# Patient Record
Sex: Male | Born: 1955 | Race: White | Hispanic: Yes | Marital: Married | Smoking: Current every day smoker
Health system: Southern US, Community
[De-identification: ages and names within clinical notes are randomized; demographics above are authoritative.]

## PROBLEM LIST (undated history)

## (undated) DIAGNOSIS — R972 Elevated prostate specific antigen [PSA]: Secondary | ICD-10-CM

## (undated) DIAGNOSIS — M5030 Other cervical disc degeneration, unspecified cervical region: Secondary | ICD-10-CM

## (undated) DIAGNOSIS — Z86010 Personal history of colonic polyps: Secondary | ICD-10-CM

## (undated) DIAGNOSIS — H269 Unspecified cataract: Secondary | ICD-10-CM

## (undated) DIAGNOSIS — E119 Type 2 diabetes mellitus without complications: Secondary | ICD-10-CM

## (undated) DIAGNOSIS — N401 Enlarged prostate with lower urinary tract symptoms: Secondary | ICD-10-CM

## (undated) DIAGNOSIS — I10 Essential (primary) hypertension: Secondary | ICD-10-CM

## (undated) DIAGNOSIS — I2510 Atherosclerotic heart disease of native coronary artery without angina pectoris: Secondary | ICD-10-CM

## (undated) DIAGNOSIS — C679 Malignant neoplasm of bladder, unspecified: Secondary | ICD-10-CM

## (undated) DIAGNOSIS — Z85528 Personal history of other malignant neoplasm of kidney: Secondary | ICD-10-CM

## (undated) DIAGNOSIS — N529 Male erectile dysfunction, unspecified: Secondary | ICD-10-CM

## (undated) DIAGNOSIS — F1910 Other psychoactive substance abuse, uncomplicated: Secondary | ICD-10-CM

## (undated) DIAGNOSIS — C801 Malignant (primary) neoplasm, unspecified: Secondary | ICD-10-CM

## (undated) DIAGNOSIS — K219 Gastro-esophageal reflux disease without esophagitis: Secondary | ICD-10-CM

## (undated) DIAGNOSIS — Z87442 Personal history of urinary calculi: Secondary | ICD-10-CM

## (undated) DIAGNOSIS — M1990 Unspecified osteoarthritis, unspecified site: Secondary | ICD-10-CM

## (undated) DIAGNOSIS — R7309 Other abnormal glucose: Secondary | ICD-10-CM

## (undated) DIAGNOSIS — E785 Hyperlipidemia, unspecified: Secondary | ICD-10-CM

## (undated) DIAGNOSIS — Z860101 Personal history of adenomatous and serrated colon polyps: Secondary | ICD-10-CM

## (undated) DIAGNOSIS — M5020 Other cervical disc displacement, unspecified cervical region: Secondary | ICD-10-CM

## (undated) DIAGNOSIS — C641 Malignant neoplasm of right kidney, except renal pelvis: Secondary | ICD-10-CM

## (undated) DIAGNOSIS — G709 Myoneural disorder, unspecified: Secondary | ICD-10-CM

## (undated) DIAGNOSIS — D49519 Neoplasm of unspecified behavior of unspecified kidney: Secondary | ICD-10-CM

## (undated) HISTORY — DX: Other psychoactive substance abuse, uncomplicated: F19.10

## (undated) HISTORY — DX: Other abnormal glucose: R73.09

## (undated) HISTORY — PX: OTHER SURGICAL HISTORY: SHX169

## (undated) HISTORY — DX: Elevated prostate specific antigen (PSA): R97.20

## (undated) HISTORY — DX: Unspecified osteoarthritis, unspecified site: M19.90

## (undated) HISTORY — DX: Unspecified cataract: H26.9

## (undated) HISTORY — PX: EXTRACORPOREAL SHOCK WAVE LITHOTRIPSY: SHX1557

## (undated) HISTORY — DX: Essential (primary) hypertension: I10

## (undated) HISTORY — DX: Malignant (primary) neoplasm, unspecified: C80.1

## (undated) HISTORY — DX: Type 2 diabetes mellitus without complications: E11.9

## (undated) HISTORY — PX: CATARACT EXTRACTION: SUR2

## (undated) HISTORY — DX: Hyperlipidemia, unspecified: E78.5

---
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
## 1998-04-12 ENCOUNTER — Emergency Department (HOSPITAL_COMMUNITY): Admission: EM | Admit: 1998-04-12 | Discharge: 1998-04-12 | Payer: Self-pay

## 2002-07-03 ENCOUNTER — Encounter: Admission: RE | Admit: 2002-07-03 | Discharge: 2002-07-03 | Payer: Self-pay | Admitting: Internal Medicine

## 2002-07-03 ENCOUNTER — Encounter: Payer: Self-pay | Admitting: Internal Medicine

## 2004-09-13 ENCOUNTER — Ambulatory Visit: Payer: Self-pay | Admitting: Internal Medicine

## 2004-10-11 ENCOUNTER — Ambulatory Visit: Payer: Self-pay | Admitting: Internal Medicine

## 2004-11-06 ENCOUNTER — Ambulatory Visit: Payer: Self-pay | Admitting: Internal Medicine

## 2004-12-12 ENCOUNTER — Ambulatory Visit (HOSPITAL_COMMUNITY): Admission: RE | Admit: 2004-12-12 | Discharge: 2004-12-12 | Payer: Self-pay | Admitting: Internal Medicine

## 2005-01-08 ENCOUNTER — Ambulatory Visit: Payer: Self-pay | Admitting: Internal Medicine

## 2005-01-09 ENCOUNTER — Ambulatory Visit: Payer: Self-pay | Admitting: Internal Medicine

## 2005-11-14 ENCOUNTER — Ambulatory Visit: Payer: Self-pay | Admitting: Internal Medicine

## 2006-01-16 ENCOUNTER — Ambulatory Visit: Payer: Self-pay | Admitting: Endocrinology

## 2006-04-12 ENCOUNTER — Ambulatory Visit: Payer: Self-pay | Admitting: Internal Medicine

## 2006-06-24 ENCOUNTER — Ambulatory Visit: Payer: Self-pay | Admitting: Internal Medicine

## 2006-06-25 ENCOUNTER — Ambulatory Visit: Payer: Self-pay | Admitting: Internal Medicine

## 2006-07-11 ENCOUNTER — Ambulatory Visit: Payer: Self-pay | Admitting: Internal Medicine

## 2006-07-23 ENCOUNTER — Ambulatory Visit: Payer: Self-pay | Admitting: Internal Medicine

## 2006-08-07 ENCOUNTER — Ambulatory Visit: Payer: Self-pay | Admitting: Internal Medicine

## 2006-08-07 ENCOUNTER — Encounter (INDEPENDENT_AMBULATORY_CARE_PROVIDER_SITE_OTHER): Payer: Self-pay | Admitting: Specialist

## 2006-10-15 DIAGNOSIS — Z87438 Personal history of other diseases of male genital organs: Secondary | ICD-10-CM

## 2006-10-15 HISTORY — DX: Personal history of other diseases of male genital organs: Z87.438

## 2006-12-24 ENCOUNTER — Ambulatory Visit: Payer: Self-pay | Admitting: Internal Medicine

## 2007-04-12 ENCOUNTER — Ambulatory Visit: Payer: Self-pay | Admitting: Internal Medicine

## 2007-04-17 ENCOUNTER — Ambulatory Visit: Payer: Self-pay | Admitting: Internal Medicine

## 2007-05-21 ENCOUNTER — Ambulatory Visit: Payer: Self-pay | Admitting: Internal Medicine

## 2007-06-04 ENCOUNTER — Ambulatory Visit: Payer: Self-pay | Admitting: Internal Medicine

## 2007-06-04 ENCOUNTER — Encounter: Payer: Self-pay | Admitting: Internal Medicine

## 2007-06-04 LAB — HM COLONOSCOPY

## 2007-06-26 ENCOUNTER — Ambulatory Visit: Payer: Self-pay | Admitting: Internal Medicine

## 2007-06-26 LAB — CONVERTED CEMR LAB
ALT: 30 units/L (ref 0–53)
AST: 19 units/L (ref 0–37)
Albumin: 3.9 g/dL (ref 3.5–5.2)
Alkaline Phosphatase: 88 units/L (ref 39–117)
BUN: 21 mg/dL (ref 6–23)
Bacteria, UA: NEGATIVE
Basophils Absolute: 0.1 10*3/uL (ref 0.0–0.1)
Basophils Relative: 0.8 % (ref 0.0–1.0)
Bilirubin Urine: NEGATIVE
Bilirubin, Direct: 0.1 mg/dL (ref 0.0–0.3)
CO2: 30 meq/L (ref 19–32)
Calcium: 9.1 mg/dL (ref 8.4–10.5)
Chloride: 107 meq/L (ref 96–112)
Cholesterol: 132 mg/dL (ref 0–200)
Creatinine, Ser: 0.9 mg/dL (ref 0.4–1.5)
Crystals: NEGATIVE
Eosinophils Absolute: 0.3 10*3/uL (ref 0.0–0.6)
Eosinophils Relative: 4.2 % (ref 0.0–5.0)
GFR calc Af Amer: 114 mL/min
GFR calc non Af Amer: 94 mL/min
Glucose, Bld: 122 mg/dL — ABNORMAL HIGH (ref 70–99)
HCT: 45.7 % (ref 39.0–52.0)
HDL: 22.9 mg/dL — ABNORMAL LOW (ref >39.0)
Hemoglobin, Urine: NEGATIVE
Hemoglobin: 15.8 g/dL (ref 13.0–17.0)
Ketones, ur: NEGATIVE mg/dL
LDL Cholesterol: 88 mg/dL (ref 0–99)
Lymphocytes Relative: 20.3 % (ref 12.0–46.0)
MCHC: 34.6 g/dL (ref 30.0–36.0)
MCV: 88.8 fL (ref 78.0–100.0)
Monocytes Absolute: 0.6 10*3/uL (ref 0.2–0.7)
Monocytes Relative: 9.1 % (ref 3.0–11.0)
Mucus, UA: NEGATIVE
Neutro Abs: 4.6 10*3/uL (ref 1.4–7.7)
Neutrophils Relative %: 65.6 % (ref 43.0–77.0)
Nitrite: NEGATIVE
PSA: 2.46 ng/mL (ref 0.10–4.00)
Platelets: 162 10*3/uL (ref 150–400)
Potassium: 3.6 meq/L (ref 3.5–5.1)
RBC: 5.14 M/uL (ref 4.22–5.81)
RDW: 13.5 % (ref 11.5–14.6)
Sodium: 143 meq/L (ref 135–145)
Specific Gravity, Urine: 1.015 (ref 1.000–1.03)
TSH: 0.61 microintl units/mL (ref 0.35–5.50)
Total Bilirubin: 0.5 mg/dL (ref 0.3–1.2)
Total CHOL/HDL Ratio: 5.8
Total Protein, Urine: NEGATIVE mg/dL
Total Protein: 6.6 g/dL (ref 6.0–8.3)
Triglycerides: 106 mg/dL (ref 0–149)
Urine Glucose: NEGATIVE mg/dL
Urobilinogen, UA: 0.2 (ref 0.0–1.0)
VLDL: 21 mg/dL (ref 0–40)
WBC: 7 10*3/uL (ref 4.5–10.5)
pH: 6.5 (ref 5.0–8.0)

## 2007-06-27 ENCOUNTER — Ambulatory Visit: Payer: Self-pay | Admitting: Internal Medicine

## 2007-06-27 DIAGNOSIS — G43809 Other migraine, not intractable, without status migrainosus: Secondary | ICD-10-CM | POA: Insufficient documentation

## 2007-06-27 DIAGNOSIS — R972 Elevated prostate specific antigen [PSA]: Secondary | ICD-10-CM | POA: Insufficient documentation

## 2007-06-30 ENCOUNTER — Encounter: Payer: Self-pay | Admitting: Internal Medicine

## 2007-06-30 DIAGNOSIS — I10 Essential (primary) hypertension: Secondary | ICD-10-CM | POA: Insufficient documentation

## 2007-10-28 ENCOUNTER — Ambulatory Visit: Payer: Self-pay | Admitting: Internal Medicine

## 2007-10-28 DIAGNOSIS — B079 Viral wart, unspecified: Secondary | ICD-10-CM | POA: Insufficient documentation

## 2007-12-18 ENCOUNTER — Encounter: Payer: Self-pay | Admitting: Internal Medicine

## 2008-05-07 ENCOUNTER — Ambulatory Visit: Payer: Self-pay | Admitting: Internal Medicine

## 2008-05-07 LAB — CONVERTED CEMR LAB
BUN: 15 mg/dL (ref 6–23)
CO2: 28 meq/L (ref 19–32)
Calcium: 8.9 mg/dL (ref 8.4–10.5)
Chloride: 105 meq/L (ref 96–112)
Cholesterol: 147 mg/dL (ref 0–200)
Creatinine, Ser: 0.8 mg/dL (ref 0.4–1.5)
GFR calc Af Amer: 130 mL/min
GFR calc non Af Amer: 107 mL/min
Glucose, Bld: 157 mg/dL — ABNORMAL HIGH (ref 70–99)
HDL: 24.8 mg/dL — ABNORMAL LOW (ref >39.0)
IgG (Immunoglobin G), Serum: 815 mg/dL (ref 694–1618)
LDL Cholesterol: 100 mg/dL — ABNORMAL HIGH (ref 0–99)
Potassium: 3.5 meq/L (ref 3.5–5.1)
Sodium: 141 meq/L (ref 135–145)
TSH: 1.02 microintl units/mL (ref 0.35–5.50)
Total CHOL/HDL Ratio: 5.9
Triglycerides: 111 mg/dL (ref 0–149)
VLDL: 22 mg/dL (ref 0–40)

## 2008-05-10 ENCOUNTER — Ambulatory Visit: Payer: Self-pay | Admitting: Internal Medicine

## 2008-06-03 ENCOUNTER — Telehealth: Payer: Self-pay | Admitting: Internal Medicine

## 2008-08-13 ENCOUNTER — Ambulatory Visit: Payer: Self-pay | Admitting: Internal Medicine

## 2008-08-15 LAB — CONVERTED CEMR LAB
BUN: 14 mg/dL (ref 6–23)
CO2: 30 meq/L (ref 19–32)
Calcium: 9.2 mg/dL (ref 8.4–10.5)
Chloride: 108 meq/L (ref 96–112)
Creatinine, Ser: 0.8 mg/dL (ref 0.4–1.5)
GFR calc Af Amer: 130 mL/min
GFR calc non Af Amer: 107 mL/min
Glucose, Bld: 129 mg/dL — ABNORMAL HIGH (ref 70–99)
Hgb A1c MFr Bld: 6.1 % — ABNORMAL HIGH (ref 4.6–6.0)
Potassium: 4 meq/L (ref 3.5–5.1)
Sodium: 144 meq/L (ref 135–145)
Vitamin B-12: 464 pg/mL (ref 211–911)

## 2008-11-05 ENCOUNTER — Ambulatory Visit: Payer: Self-pay | Admitting: Internal Medicine

## 2008-11-08 LAB — CONVERTED CEMR LAB
ALT: 37 units/L (ref 0–53)
AST: 18 units/L (ref 0–37)
Albumin: 4 g/dL (ref 3.5–5.2)
Alkaline Phosphatase: 113 units/L (ref 39–117)
BUN: 19 mg/dL (ref 6–23)
Basophils Absolute: 0 10*3/uL (ref 0.0–0.1)
Basophils Relative: 0.1 % (ref 0.0–3.0)
Bilirubin Urine: NEGATIVE
Bilirubin, Direct: 0.1 mg/dL (ref 0.0–0.3)
CO2: 28 meq/L (ref 19–32)
Calcium: 9.4 mg/dL (ref 8.4–10.5)
Chloride: 109 meq/L (ref 96–112)
Cholesterol: 134 mg/dL (ref 0–200)
Creatinine, Ser: 0.8 mg/dL (ref 0.4–1.5)
Eosinophils Absolute: 0.2 10*3/uL (ref 0.0–0.7)
Eosinophils Relative: 2.8 % (ref 0.0–5.0)
GFR calc Af Amer: 130 mL/min
GFR calc non Af Amer: 107 mL/min
Glucose, Bld: 135 mg/dL — ABNORMAL HIGH (ref 70–99)
HCT: 49.9 % (ref 39.0–52.0)
HDL: 24.8 mg/dL — ABNORMAL LOW (ref >39.0)
Hemoglobin: 17 g/dL (ref 13.0–17.0)
Hgb A1c MFr Bld: 6.2 % — ABNORMAL HIGH (ref 4.6–6.0)
Ketones, ur: NEGATIVE mg/dL
LDL Cholesterol: 77 mg/dL (ref 0–99)
Leukocytes, UA: NEGATIVE
Lymphocytes Relative: 14.9 % (ref 12.0–46.0)
MCHC: 34 g/dL (ref 30.0–36.0)
MCV: 89.4 fL (ref 78.0–100.0)
Monocytes Absolute: 0.7 10*3/uL (ref 0.1–1.0)
Monocytes Relative: 9.3 % (ref 3.0–12.0)
Neutro Abs: 5.9 10*3/uL (ref 1.4–7.7)
Neutrophils Relative %: 72.9 % (ref 43.0–77.0)
Nitrite: NEGATIVE
PSA: 2.17 ng/mL (ref 0.10–4.00)
Platelets: 162 10*3/uL (ref 150–400)
Potassium: 3.9 meq/L (ref 3.5–5.1)
RBC: 5.59 M/uL (ref 4.22–5.81)
RDW: 13.4 % (ref 11.5–14.6)
Sodium: 142 meq/L (ref 135–145)
Specific Gravity, Urine: 1.025 (ref 1.000–1.03)
TSH: 0.89 microintl units/mL (ref 0.35–5.50)
Total Bilirubin: 0.8 mg/dL (ref 0.3–1.2)
Total CHOL/HDL Ratio: 5.4
Total Protein, Urine: NEGATIVE mg/dL
Total Protein: 6.6 g/dL (ref 6.0–8.3)
Triglycerides: 160 mg/dL — ABNORMAL HIGH (ref 0–149)
Urine Glucose: NEGATIVE mg/dL
Urobilinogen, UA: 0.2 (ref 0.0–1.0)
VLDL: 32 mg/dL (ref 0–40)
WBC: 8 10*3/uL (ref 4.5–10.5)
pH: 5.5 (ref 5.0–8.0)

## 2008-11-09 ENCOUNTER — Ambulatory Visit: Payer: Self-pay | Admitting: Internal Medicine

## 2008-11-09 DIAGNOSIS — M5000 Cervical disc disorder with myelopathy, unspecified cervical region: Secondary | ICD-10-CM | POA: Insufficient documentation

## 2009-03-28 ENCOUNTER — Ambulatory Visit: Payer: Self-pay | Admitting: Internal Medicine

## 2009-03-29 ENCOUNTER — Ambulatory Visit: Payer: Self-pay | Admitting: Internal Medicine

## 2009-03-29 DIAGNOSIS — L0231 Cutaneous abscess of buttock: Secondary | ICD-10-CM | POA: Insufficient documentation

## 2009-03-29 DIAGNOSIS — L03317 Cellulitis of buttock: Secondary | ICD-10-CM

## 2009-03-29 LAB — CONVERTED CEMR LAB
BUN: 21 mg/dL (ref 6–23)
CO2: 31 meq/L (ref 19–32)
Calcium: 9 mg/dL (ref 8.4–10.5)
Chloride: 115 meq/L — ABNORMAL HIGH (ref 96–112)
Creatinine, Ser: 0.9 mg/dL (ref 0.4–1.5)
GFR calc non Af Amer: 93.23 mL/min (ref >60)
Glucose, Bld: 124 mg/dL — ABNORMAL HIGH (ref 70–99)
Hgb A1c MFr Bld: 6.4 % (ref 4.6–6.5)
Potassium: 4.5 meq/L (ref 3.5–5.1)
Sodium: 147 meq/L — ABNORMAL HIGH (ref 135–145)

## 2009-08-15 ENCOUNTER — Ambulatory Visit: Payer: Self-pay | Admitting: Internal Medicine

## 2009-08-15 DIAGNOSIS — M545 Low back pain, unspecified: Secondary | ICD-10-CM | POA: Insufficient documentation

## 2009-08-15 DIAGNOSIS — M1990 Unspecified osteoarthritis, unspecified site: Secondary | ICD-10-CM | POA: Insufficient documentation

## 2009-08-18 LAB — CONVERTED CEMR LAB
BUN: 18 mg/dL (ref 6–23)
CO2: 28 meq/L (ref 19–32)
Calcium: 8.4 mg/dL (ref 8.4–10.5)
Chloride: 109 meq/L (ref 96–112)
Creatinine, Ser: 0.9 mg/dL (ref 0.4–1.5)
GFR calc non Af Amer: 93.1 mL/min (ref >60)
Glucose, Bld: 128 mg/dL — ABNORMAL HIGH (ref 70–99)
Hgb A1c MFr Bld: 6.5 % (ref 4.6–6.5)
Potassium: 3.8 meq/L (ref 3.5–5.1)
Sodium: 143 meq/L (ref 135–145)

## 2009-09-15 ENCOUNTER — Ambulatory Visit: Payer: Self-pay | Admitting: Internal Medicine

## 2009-09-15 DIAGNOSIS — S61209A Unspecified open wound of unspecified finger without damage to nail, initial encounter: Secondary | ICD-10-CM | POA: Insufficient documentation

## 2009-10-28 ENCOUNTER — Telehealth: Payer: Self-pay | Admitting: Internal Medicine

## 2009-11-25 ENCOUNTER — Telehealth: Payer: Self-pay | Admitting: Internal Medicine

## 2010-02-13 ENCOUNTER — Ambulatory Visit: Payer: Self-pay | Admitting: Internal Medicine

## 2010-02-14 LAB — CONVERTED CEMR LAB
ALT: 33 units/L (ref 0–53)
AST: 19 units/L (ref 0–37)
Albumin: 3.9 g/dL (ref 3.5–5.2)
Alkaline Phosphatase: 94 units/L (ref 39–117)
BUN: 14 mg/dL (ref 6–23)
Bilirubin Urine: NEGATIVE
Bilirubin, Direct: 0.1 mg/dL (ref 0.0–0.3)
CO2: 29 meq/L (ref 19–32)
Calcium: 8.8 mg/dL (ref 8.4–10.5)
Chloride: 108 meq/L (ref 96–112)
Creatinine, Ser: 0.8 mg/dL (ref 0.4–1.5)
GFR calc non Af Amer: 106.45 mL/min (ref >60)
Glucose, Bld: 176 mg/dL — ABNORMAL HIGH (ref 70–99)
Hemoglobin, Urine: NEGATIVE
Hgb A1c MFr Bld: 6.5 % (ref 4.6–6.5)
Ketones, ur: NEGATIVE mg/dL
Leukocytes, UA: NEGATIVE
Nitrite: NEGATIVE
Potassium: 3.7 meq/L (ref 3.5–5.1)
Sodium: 142 meq/L (ref 135–145)
Specific Gravity, Urine: 1.025 (ref 1.000–1.030)
Total Bilirubin: 0.4 mg/dL (ref 0.3–1.2)
Total Protein, Urine: NEGATIVE mg/dL
Total Protein: 6.1 g/dL (ref 6.0–8.3)
Uric Acid, Serum: 5.2 mg/dL (ref 4.0–7.8)
Urine Glucose: 100 mg/dL
Urobilinogen, UA: 0.2 (ref 0.0–1.0)
pH: 5.5 (ref 5.0–8.0)

## 2010-02-15 ENCOUNTER — Ambulatory Visit: Payer: Self-pay | Admitting: Internal Medicine

## 2010-02-15 LAB — CONVERTED CEMR LAB: Blood Glucose, Fingerstick: 120

## 2010-03-07 ENCOUNTER — Ambulatory Visit: Payer: Self-pay | Admitting: Internal Medicine

## 2010-03-07 DIAGNOSIS — R21 Rash and other nonspecific skin eruption: Secondary | ICD-10-CM | POA: Insufficient documentation

## 2010-03-07 DIAGNOSIS — L910 Hypertrophic scar: Secondary | ICD-10-CM | POA: Insufficient documentation

## 2010-04-10 ENCOUNTER — Telehealth: Payer: Self-pay | Admitting: Internal Medicine

## 2010-05-22 ENCOUNTER — Ambulatory Visit: Payer: Self-pay | Admitting: Internal Medicine

## 2010-05-22 LAB — CONVERTED CEMR LAB
ALT: 29 units/L (ref 0–53)
AST: 18 units/L (ref 0–37)
Albumin: 4.3 g/dL (ref 3.5–5.2)
Alkaline Phosphatase: 91 units/L (ref 39–117)
BUN: 20 mg/dL (ref 6–23)
Bilirubin, Direct: 0.1 mg/dL (ref 0.0–0.3)
CO2: 28 meq/L (ref 19–32)
Calcium: 9.1 mg/dL (ref 8.4–10.5)
Chloride: 106 meq/L (ref 96–112)
Cholesterol: 129 mg/dL (ref 0–200)
Creatinine, Ser: 0.8 mg/dL (ref 0.4–1.5)
GFR calc non Af Amer: 103.36 mL/min (ref >60)
Glucose, Bld: 113 mg/dL — ABNORMAL HIGH (ref 70–99)
HDL: 26.1 mg/dL — ABNORMAL LOW (ref >39.00)
Hgb A1c MFr Bld: 6.4 % (ref 4.6–6.5)
LDL Cholesterol: 75 mg/dL (ref 0–99)
PSA: 2.09 ng/mL (ref 0.10–4.00)
Potassium: 4.1 meq/L (ref 3.5–5.1)
Sodium: 144 meq/L (ref 135–145)
Total Bilirubin: 0.3 mg/dL (ref 0.3–1.2)
Total CHOL/HDL Ratio: 5
Total Protein: 6.5 g/dL (ref 6.0–8.3)
Triglycerides: 139 mg/dL (ref 0.0–149.0)
VLDL: 27.8 mg/dL (ref 0.0–40.0)

## 2010-05-23 ENCOUNTER — Ambulatory Visit: Payer: Self-pay | Admitting: Internal Medicine

## 2010-05-23 DIAGNOSIS — E1129 Type 2 diabetes mellitus with other diabetic kidney complication: Secondary | ICD-10-CM | POA: Insufficient documentation

## 2010-05-23 DIAGNOSIS — E118 Type 2 diabetes mellitus with unspecified complications: Secondary | ICD-10-CM | POA: Insufficient documentation

## 2010-09-15 ENCOUNTER — Ambulatory Visit: Payer: Self-pay | Admitting: Internal Medicine

## 2010-09-22 ENCOUNTER — Ambulatory Visit: Payer: Self-pay | Admitting: Internal Medicine

## 2010-09-29 LAB — CONVERTED CEMR LAB
BUN: 20 mg/dL (ref 6–23)
CO2: 26 meq/L (ref 19–32)
Calcium: 8.8 mg/dL (ref 8.4–10.5)
Chloride: 108 meq/L (ref 96–112)
Creatinine, Ser: 0.8 mg/dL (ref 0.4–1.5)
GFR calc non Af Amer: 112.69 mL/min (ref >60.00)
Glucose, Bld: 130 mg/dL — ABNORMAL HIGH (ref 70–99)
Hgb A1c MFr Bld: 6.5 % (ref 4.6–6.5)
Potassium: 3.6 meq/L (ref 3.5–5.1)
Sodium: 142 meq/L (ref 135–145)

## 2010-11-16 NOTE — Progress Notes (Signed)
Summary: ibuprofen  Phone Note From Pharmacy   [facility] [place]  [phone] Reason for Call: Needs renewal Summary of Call: Requesting renewal on Ibuprofen 600mg  # 90 take 1 by mouth three times a day after meals as needed for pain. Last filled 12/21/07. Last ov 05/10/08. Pls advise. Initial call taken by: Orlan Leavens,  June 03, 2008 8:50 AM  Follow-up for Phone Call        OK for refill prn Follow-up by: Jacques Navy MD,  June 03, 2008 9:07 AM  Additional Follow-up for Phone Call Additional follow up Details #1::        [facility] with Zella Ball ok x's 1 Additional Follow-up by: Orlan Leavens,  June 03, 2008 9:39 AM    New/Updated Medications: IBUPROFEN 600 MG  TABS (IBUPROFEN) take 1 three times a day qfter meals as needed for pain .  Prescriptions: IBUPROFEN 600 MG  TABS (IBUPROFEN) take 1 three times a day qfter meals as needed for pain  #90 x 0   Entered by:   Orlan Leavens   Authorized by:   Jacques Navy MD   Signed by:   Orlan Leavens on 06/03/2008   Method used:   Telephoned to ...       [facility]  [phone]*       [address]       [place], [place]  [address]       Ph: [phone] or [phone]       Fax: [phone]   RxID:   [id number]

## 2010-11-16 NOTE — Assessment & Plan Note (Signed)
Summary: 3 MOS F/U #/ CD   Vital Signs:  Patient profile:   55 year old male Height:      66 inches Weight:      205 pounds BMI:     33.21 O2 Sat:      96 % on Room air Temp:     98.7 degrees F oral Pulse rate:   79 / minute Pulse rhythm:   regular Resp:     16 per minute BP sitting:   118 / 74  (left arm) Cuff size:   regular  Vitals Entered By: Lanier Prude, CMA(AAMA) (May 23, 2010 3:48 PM)  O2 Flow:  Room air  Current Medications (verified): 1)  Benicar Hct 40-12.5 Mg Tabs (Olmesartan Medoxomil-Hctz) .Marland Kitchen.. 1 Po Qd 2)  Ibuprofen 800 Mg Tabs (Ibuprofen) .Marland Kitchen.. 1 By Mouth Two Times A Day As Needed Pain 3)  Vitamin D3 1000 Unit  Tabs (Cholecalciferol) .Marland Kitchen.. 1 Qd 4)  Viagra 100 Mg Tabs (Sildenafil Citrate) .Marland Kitchen.. 1 By Mouth Once Daily Prn 5)  Clotrimazole-Betamethasone 1-0.05 % Crea (Clotrimazole-Betamethasone) .... Use As Directed 6)  Metformin Hcl 500 Mg Tabs (Metformin Hcl) .Marland Kitchen.. 1 By Mouth Once Daily For Sugar Problem 7)  Aspirin 81 Mg Tbec (Aspirin) .Marland Kitchen.. 1 By Mouth Qd 8)  Triamcinolone Acetonide 0.5 % Oint (Triamcinolone Acetonide) .... Use Two Times A Day As Needed On Rash 9)  Hydroxyzine Hcl 25 Mg Tabs (Hydroxyzine Hcl) .Marland Kitchen.. 1-2 Tabs By Mouth Two Times A Day As Needed Itching 10)  Accu-Check Aviva Test Strips .... Use Once Daily Prn  Allergies (verified): 1)  ! Codeine Sulfate (Codeine Sulfate) 2)  Flomax  Past History:  Past Medical History: Hyperlipidemia, low LDL Hypertension Elev. PSA Elev glu  Osteoarthritis Low back pain Diabetes mellitus, type II 2011  Physical Exam  General:  NAD overweight-appearing.   Nose:  External nasal examination shows no deformity or inflammation. Nasal mucosa are pink and moist without lesions or exudates. Mouth:  Oral mucosa and oropharynx without lesions or exudates.  Teeth in good repair. Lungs:  CTA Heart:  RRR Abdomen:  S/NT Msk:  right index finger has a .75 cm v-shaped superficial flap of skin over the radial side of  the distal phalanx.  the flap is viable and there is no necrotic skin or sq tissue there is no debris or fb and no boney derangement. he has good flexion and extension at the DIP joint and good sensation and capillary refill distally. Skin:  R lat shin with 2 keloid scars on prox lat aspect: prox 5 mm distal 11 mm Psych:  Cognition and judgment appear intact. Alert and cooperative with normal attention span and concentration. No apparent delusions, illusions, hallucinations   Impression & Recommendations:  Problem # 1:  HYPERTENSION (ICD-401.9) Assessment Improved  His updated medication list for this problem includes:    Benicar Hct 40-12.5 Mg Tabs (Olmesartan medoxomil-hctz) .Marland Kitchen... 1 po qd  Problem # 2:  HYPERLIPIDEMIA (ICD-272.4) Assessment: Unchanged The labs were reviewed with the patient.   Problem # 3:  DIABETES MELLITUS, TYPE II (ICD-250.00) Assessment: Improved  His updated medication list for this problem includes:    Benicar Hct 40-12.5 Mg Tabs (Olmesartan medoxomil-hctz) .Marland Kitchen... 1 po qd    Metformin Hcl 500 Mg Tabs (Metformin hcl) .Marland Kitchen... 1 by mouth bid for sugar problem    Aspirin 81 Mg Tbec (Aspirin) .Marland Kitchen... 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 Verbal consent  was obtained.  Smaller keloid was inj with 5 md depo and 0.5 cc 2%lido Larger keloid was injwith 10 mg depo and 1 cc 2% lido Tolerated well. Complicatons - none.   Complete Medication List: 1)  Benicar Hct 40-12.5 Mg Tabs (Olmesartan medoxomil-hctz) .Marland Kitchen.. 1 po qd 2)  Ibuprofen 800 Mg Tabs (Ibuprofen) .Marland Kitchen.. 1 by mouth two times a day as needed pain 3)  Vitamin D3 1000 Unit Tabs (Cholecalciferol) .Marland Kitchen.. 1 qd 4)  Viagra 100 Mg Tabs (Sildenafil citrate)  .Marland Kitchen.. 1 by mouth once daily prn 5)  Clotrimazole-betamethasone 1-0.05 % Crea (Clotrimazole-betamethasone) .... Use as directed 6)  Metformin Hcl 500 Mg Tabs (Metformin hcl) .Marland Kitchen.. 1 by mouth bid for sugar problem 7)  Aspirin 81 Mg Tbec (Aspirin) .Marland Kitchen.. 1 by mouth qd 8)  Triamcinolone Acetonide 0.5 % Oint (Triamcinolone acetonide) .... Use two times a day as needed on rash 9)  Hydroxyzine Hcl 25 Mg Tabs (Hydroxyzine hcl) .Marland Kitchen.. 1-2 tabs by mouth two times a day as needed itching 10)  Accu-check Aviva Test Strips  .... Use once daily prn  Patient Instructions: 1)  Please schedule a follow-up appointment in 4 months. 2)  BMP prior to visit, ICD-9: 3)  HbgA1C prior to visit, ICD-9:250.00 Prescriptions: METFORMIN HCL 500 MG TABS (METFORMIN HCL) 1 by mouth bid for sugar problem  #180 x 3   Entered and Authorized by:   Tresa Garter MD   Signed by:   Tresa Garter MD on 05/23/2010   Method used:   Print then Give to Patient   RxID:   [id number]

## 2010-11-16 NOTE — Assessment & Plan Note (Signed)
Vital Signs:  Patient Profile:   55 Years Old Male Weight:      197 pounds Temp:     97.1 degrees F Pulse rate:   75 / minute BP sitting:   141 / 82  Pt. in pain?   no                  Preventive Care Screening  Colonoscopy:    Date:  06/04/2007    Results:  Adenomatous Polyp   Last Tetanus Booster:    Date:  01/13/2006    Results:  given    Chief Complaint:  Preventive Care.  Current Allergies: FLOMAX (TAMSULOSIN HCL)  Past Medical History:    Hyperlipidemia    Hypertension   Family History:    mother had lymphoma.  Father is healthy  Social History:    Occupation: Counsellor    Divorced    Current Smoker    Regular exercise-no   Risk Factors:  Tobacco use:  current    Counseled to quit/cut down tobacco use:  yes Exercise:  no  Colonoscopy History:     Date of Last Colonoscopy:  06/04/2007    Results:  Adenomatous Polyp    Review of Systems  The patient denies anorexia, fever, weight loss, vision loss, decreased hearing, hoarseness, chest pain, syncope, dyspnea on exhertion, peripheral edema, prolonged cough, hemoptysis, abdominal pain, melena, hematochezia, severe indigestion/heartburn, hematuria, incontinence, genital sores, muscle weakness, suspicious skin lesions, transient blindness, difficulty walking, depression, unusual weight change, abnormal bleeding, enlarged lymph nodes, angioedema, breast masses, and testicular masses.    CV      Denies chest pain or discomfort, fainting, and palpitations.   Physical Exam  General:     Well-developed,well-nourished,in no acute distress; alert,appropriate and cooperative throughout examination Head:     Normocephalic and atraumatic without obvious abnormalities. No apparent alopecia or balding. Eyes:     No corneal or conjunctival inflammation noted. EOMI. Perrla. Funduscopic exam benign, without hemorrhages, exudates or papilledema. Vision grossly normal. Ears:     External ear exam shows no  significant lesions or deformities.  Otoscopic examination reveals clear canals, tympanic membranes are intact bilaterally without bulging, retraction, inflammation or discharge. Hearing is grossly normal bilaterally. Nose:     External nasal examination shows no deformity or inflammation. Nasal mucosa are pink and moist without lesions or exudates. Mouth:     Oral mucosa and oropharynx without lesions or exudates.  Teeth in good repair. Neck:     No deformities, masses, or tenderness noted. Lungs:     Normal respiratory effort, chest expands symmetrically. Lungs are clear to auscultation, no crackles or wheezes. Heart:     Normal rate and regular rhythm. S1 and S2 normal without gallop, murmur, click, rub or other extra sounds. Abdomen:     Bowel sounds positive,abdomen soft and non-tender without masses, organomegaly or hernias noted. Rectal:     No external abnormalities noted. Normal sphincter tone. No rectal masses or tenderness. Prostate:     Prostate gland firm and smooth, no nodularity, tenderness, mass, asymmetry or induration.1+ enlarged.   Msk:     No deformity or scoliosis noted of thoracic or lumbar spine.   Pulses:     R and L carotid,radial,femoral,dorsalis pedis and posterior tibial pulses are full and equal bilaterally Extremities:     No clubbing, cyanosis, edema, or deformity noted with normal full range of motion of all joints.   Neurologic:     No cranial nerve deficits  noted. Station and gait are normal. Plantar reflexes are down-going bilaterally. DTRs are symmetrical throughout. Sensory, motor and coordinative functions appear intact. Psych:     Cognition and judgment appear intact. Alert and cooperative with normal attention span and concentration. No apparent delusions, illusions, hallucinations    Impression & Recommendations:  Problem # 1:  Preventive Health Care (ICD-V70.0)  Problem # 2:  HYPERLIPIDEMIA (ICD-272.4) Assessment: Deteriorated  His  updated medication list for this problem includes:    Niaspan 500 Mg Tbcr (Niacin (antihyperlipidemic)) .Marland Kitchen... 2 at bedtime to try   His updated medication list for this problem includes:    Niaspan 500 Mg Tbcr (Niacin (antihyperlipidemic)) .Marland Kitchen... 2 qhs   Problem # 3:  PSA, INCREASED (ICD-790.93) Assessment: Deteriorated Urol. consult  Problem # 4:  HYPERTENSION (ICD-401.9)  His updated medication list for this problem includes:    Benicar Hct 40-12.5 Mg Tabs (Olmesartan medoxomil-hctz) ..... Qd   Complete Medication List: 1)  Benicar Hct 40-12.5 Mg Tabs (Olmesartan medoxomil-hctz) .... Qd 2)  Niaspan 500 Mg Tbcr (Niacin (antihyperlipidemic)) .... 2 qhs   Patient Instructions: 1)  Tobacco is very bad for your health and your loved ones! You Should stop smoking!. 2)  It is important that you exercise regularly at least 20 minutes 5 times a week. If you develop chest pain, have severe difficulty breathing, or feel very tired , stop exercising immediately and seek medical attention. 3)  You need to lose weight. Consider a lower calorie diet and regular exercise.  4)  Please schedule a follow-up appointment in 6 months.    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 Qd 2)  Niaspan 500 Mg Tbcr (Niacin (antihyperlipidemic)) .... 2 qhs  Current Medication List: 1)  Benicar Hct  40-12.5 Mg Tabs (Olmesartan medoxomil-hctz) .... Qd 2)  Niaspan 500 Mg Tbcr (Niacin (antihyperlipidemic)) .... 2 qhs  Follow up instructions: Tobacco is very bad for your health and your loved ones! You Should stop smoking!. It is important that you exercise regularly at least 20 minutes 5 times a week. If you develop chest pain, have severe difficulty breathing, or feel very tired , stop exercising immediately and seek medical attention. You need to lose weight. Consider a lower calorie diet and regular exercise.  Please schedule a follow-up appointment in 6 months.

## 2010-11-16 NOTE — Assessment & Plan Note (Signed)
Summary: 4 mo rov /nws $50   Vital Signs:  Patient profile:   55 year old male Height:      65 inches Weight:      209 pounds BMI:     34.91 Temp:     98.5 degrees F oral Pulse rate:   75 / minute BP sitting:   124 / 80  (left arm)  Vitals Entered By: Tora Perches (March 29, 2009 3:26 PM) CC: f/u Is Patient Diabetic? No   CC:  f/u.  History of Present Illness:  F/u HTN and elev glu. C/o tail bone abscess    Current Medications (verified): 1)  Benicar Hct 40-12.5 Mg Tabs (Olmesartan Medoxomil-Hctz) .Marland Kitchen.. 1 Po Qd 2)  Ibuprofen 800 Mg Tabs (Ibuprofen) .Marland Kitchen.. 1 By Mouth Two Times A Day As Needed Pain 3)  Vitamin D3 1000 Unit  Tabs (Cholecalciferol) .Marland Kitchen.. 1 Qd 4)  Viagra 100 Mg Tabs (Sildenafil Citrate) .Marland Kitchen.. 1 By Mouth Once Daily Prn  Allergies: 1)  ! Codeine Sulfate (Codeine Sulfate) 2)  Flomax (Tamsulosin Hcl)  Past History:  Family History: Last updated: 03/29/2009 mother had lymphoma.  Father is healthy M colon Ca at 11  Past Medical History: Hyperlipidemia, low LDL Hypertension Elev. PSA Elev glu   Family History: mother had lymphoma.  Father is healthy M colon Ca at 69  Physical Exam  General:  NAD Nose:  External nasal examination shows no deformity or inflammation. Nasal mucosa are pink and moist without lesions or exudates. Mouth:  Oral mucosa and oropharynx without lesions or exudates.  Teeth in good repair. Lungs:  CTA Heart:  RRR Abdomen:  S/NT Msk:  No deformity or scoliosis noted of thoracic or lumbar spine.   Neurologic:  No cranial nerve deficits noted. Station and gait are normal. Plantar reflexes are down-going bilaterally. DTRs are symmetrical throughout. Sensory, motor and coordinative functions appear intact. Skin:  R groin 1.2 cm abscess Psych:  Cognition and judgment appear intact. Alert and cooperative with normal attention span and concentration. No apparent delusions, illusions, hallucinations   Impression & Recommendations:  Problem  # 1:  ABNORMAL GLUCOSE NEC (ICD-790.29) Assessment Comment Only Diet and wt loss discussed  Problem # 2:  HYPERTENSION (ICD-401.9) Assessment: Comment Only  His updated medication list for this problem includes:    Benicar Hct 40-12.5 Mg Tabs (Olmesartan medoxomil-hctz) .Marland Kitchen... 1 po qd  Problem # 3:  CELLULITIS AND ABSCESS OF BUTTOCK (ICD-682.5) early Assessment: New  His updated medication list for this problem includes:    Doxycycline Hyclate 100 Mg Caps (Doxycycline hyclate) .Marland Kitchen... Take 1 tab twice a day  Problem # 4:  HYPERLIPIDEMIA (ICD-272.4) Assessment: Comment Only  As above  Complete Medication List: 1)  Benicar Hct 40-12.5 Mg Tabs (Olmesartan medoxomil-hctz) .Marland Kitchen.. 1 po qd 2)  Ibuprofen 800 Mg Tabs (Ibuprofen) .Marland Kitchen.. 1 by mouth two times a day as needed pain 3)  Vitamin D3 1000 Unit Tabs (Cholecalciferol) .Marland Kitchen.. 1 qd 4)  Viagra 100 Mg Tabs (Sildenafil citrate) .Marland Kitchen.. 1 by mouth once daily prn 5)  Doxycycline Hyclate 100 Mg Caps (Doxycycline hyclate) .... Take 1 tab twice a day  Patient Instructions: 1)  [email or web address] 2)  [email or web address] 3)  Start a yoga class 4)  Please schedule a follow-up appointment in 3 months. 5)  BMP prior to visit, ICD-9: 6)  HbgA1C prior to visit, ICD-9 790.29: Prescriptions: DOXYCYCLINE HYCLATE 100 MG CAPS (DOXYCYCLINE HYCLATE) Take 1 tab twice a day  #20 x 1   Entered and  Authorized by:   Tresa Garter MD   Signed by:   Tresa Garter MD on 03/29/2009   Method used:   Electronically to        [facility]  [phone]* (retail)       [address] [place], [place]  [address]       Ph: [phone] or [phone]       Fax: [phone]   RxID:   [id number]

## 2010-11-16 NOTE — Assessment & Plan Note (Signed)
Summary: 4 MO ROV /NWS  #   Vital Signs:  Patient profile:   55 year old male Height:      66 inches Weight:      210 pounds BMI:     34.02 Temp:     98.7 degrees F oral Pulse rate:   88 / minute Pulse rhythm:   regular Resp:     16 per minute BP sitting:   140 / 92  (left arm) Cuff size:   regular  Vitals Entered By: Lanier Prude, Beverly Gust) (October 02, 2010 3:42 PM) CC: 4 mo f/u  Is Patient Diabetic? Yes Comments pt states Metformin causes him to have pain in kidney area so he does not take it daily as prescribed   Primary Care Joan Avetisyan:  Georgina Quint Plotnikov MD  CC:  4 mo f/u .  History of Present Illness: The patient presents for a follow up of hypertension, diabetes, hyperlipidemia They are remodeling kitchen - eating out a lot  Current Medications (verified): 1)  Benicar Hct 40-12.5 Mg Tabs (Olmesartan Medoxomil-Hctz) .Marland Kitchen.. 1 Po Qd 2)  Ibuprofen 800 Mg Tabs (Ibuprofen) .Marland Kitchen.. 1 By Mouth Two Times A Day As Needed Pain 3)  Vitamin D3 1000 Unit  Tabs (Cholecalciferol) .Marland Kitchen.. 1 Qd 4)  Viagra 100 Mg Tabs (Sildenafil Citrate) .Marland Kitchen.. 1 By Mouth Once Daily Prn 5)  Clotrimazole-Betamethasone 1-0.05 % Crea (Clotrimazole-Betamethasone) .... Use As Directed 6)  Metformin Hcl 500 Mg Tabs (Metformin Hcl) .Marland Kitchen.. 1 By Mouth Bid For Sugar Problem 7)  Aspirin 81 Mg Tbec (Aspirin) .Marland Kitchen.. 1 By Mouth Qd 8)  Triamcinolone Acetonide 0.5 % Oint (Triamcinolone Acetonide) .... Use Two Times A Day As Needed On Rash 9)  Hydroxyzine Hcl 25 Mg Tabs (Hydroxyzine Hcl) .Marland Kitchen.. 1-2 Tabs By Mouth Two Times A Day As Needed Itching 10)  Accu-Check Aviva Test Strips .... Use Once Daily Prn  Allergies (verified): 1)  ! Codeine Sulfate (Codeine Sulfate) 2)  Flomax  Past History:  Past Medical History: Last updated: 05/23/2010 Hyperlipidemia, low LDL Hypertension Elev. PSA Elev glu  Osteoarthritis Low back pain Diabetes mellitus, type II 2011  Social History: Last updated: 10/28/2007 Occupation:  Counsellor  Current Smoker Regular exercise-no Married  Review of Systems       The patient complains of weight gain.  The patient denies chest pain, syncope, and dyspnea on exertion.    Physical Exam  General:  NAD overweight-appearing.   Head:  Normocephalic and atraumatic without obvious abnormalities. No apparent alopecia or balding. Mouth:  Oral mucosa and oropharynx without lesions or exudates.  Teeth in good repair. Lungs:  CTA Heart:  RRR Abdomen:  S/NT Msk:  right index finger has a .75 cm v-shaped superficial flap of skin over the radial side of the distal phalanx.  the flap is viable and there is no necrotic skin or sq tissue there is no debris or fb and no boney derangement. he has good flexion and extension at the DIP joint and good sensation and capillary refill distally. Neurologic:  No cranial nerve deficits noted. Station and gait are normal. Plantar reflexes are down-going bilaterally. DTRs are symmetrical throughout. Sensory, motor and coordinative functions appear intact. Skin:  R lat shin with 2 keloid scars on prox lat aspect: much better  Psych:  Cognition and judgment appear intact. Alert and cooperative with normal attention span and concentration. No apparent delusions, illusions, hallucinations   Impression & Recommendations:  Problem # 1:  DIABETES MELLITUS, TYPE II (ICD-250.00) Assessment Unchanged  The following medications were removed from the medication list:    Metformin Hcl 500 Mg Tabs (Metformin hcl) .Marland Kitchen... 1 by mouth bid for sugar problem His updated medication list for this problem includes:    Benicar Hct 40-12.5 Mg Tabs (Olmesartan medoxomil-hctz) .Marland Kitchen... 1 po qd    Aspirin 81 Mg Tbec (Aspirin) .Marland Kitchen... 1 by mouth qd  Labs Reviewed: Creat: 0.8 (09/29/2010)    Reviewed HgBA1c results: 6.5 (09/29/2010)  6.4 (05/22/2010)  Problem # 2:  HYPERTENSION (ICD-401.9) Assessment: Deteriorated  His updated medication list for this problem includes:     Benicar Hct 40-12.5 Mg Tabs (Olmesartan medoxomil-hctz) .Marland Kitchen... 1 po qd  BP today: 140/92 Prior BP: 118/74 (05/23/2010)  Labs Reviewed: K+: 3.6 (09/29/2010) Creat: : 0.8 (09/29/2010)   Chol: 129 (05/22/2010)   HDL: 26.10 (05/22/2010)   LDL: 75 (05/22/2010)   TG: 139.0 (05/22/2010)  Problem # 3:  LOW BACK PAIN (ICD-724.2) Assessment: Unchanged  His updated medication list for this problem includes:    Ibuprofen 800 Mg Tabs (Ibuprofen) .Marland Kitchen... 1 by mouth two times a day as needed pain    Aspirin 81 Mg Tbec (Aspirin) .Marland Kitchen... 1 by mouth qd  Problem # 4:  KELOID SCAR (ICD-701.4) Assessment: Improved  Problem # 5:  PSA, INCREASED (ICD-790.93) Assessment: Comment Only  Complete Medication List: 1)  Benicar Hct 40-12.5 Mg Tabs (Olmesartan medoxomil-hctz) .Marland Kitchen.. 1 po qd 2)  Ibuprofen 800 Mg Tabs (Ibuprofen) .Marland Kitchen.. 1 by mouth two times a day as needed pain 3)  Vitamin D3 1000 Unit Tabs (Cholecalciferol) .Marland Kitchen.. 1 qd 4)  Viagra 100 Mg Tabs (Sildenafil citrate) .Marland Kitchen.. 1 by mouth once daily prn 5)  Clotrimazole-betamethasone 1-0.05 % Crea (Clotrimazole-betamethasone) .... Use as directed 6)  Aspirin 81 Mg Tbec (Aspirin) .Marland Kitchen.. 1 by mouth qd 7)  Triamcinolone Acetonide 0.5 % Oint (Triamcinolone acetonide) .... Use two times a day as needed on rash 8)  Hydroxyzine Hcl 25 Mg Tabs (Hydroxyzine hcl) .Marland Kitchen.. 1-2 tabs by mouth two times a day as needed itching 9)  Accu-check Aviva Test Strips  .... Use once daily prn  Patient Instructions: 1)  Please schedule a follow-up appointment in 4 months. 2)  BMP prior to visit, ICD-9: 3)  PSA prior to visit, ICD-9:790.29 4)  HbgA1C prior to visit, ICD-9:   Orders Added: 1)  Est. Patient Level IV [45409]

## 2010-11-16 NOTE — Assessment & Plan Note (Signed)
Summary: 4 MO ROV/NWS   Vital Signs:  Patient Profile:   55 Years Old Male Weight:      195 pounds Temp:     99 degrees F oral Pulse rate:   68 / minute BP sitting:   130 / 71  (left arm)  Vitals Entered By: Tora Perches (October 28, 2007 2:14 PM)             Is Patient Diabetic? No     Chief Complaint:  Multiple medical problems or concerns.  History of Present Illness: The patient presents for a follow up of HTN, wart, bladder urgency, elev. PSA.   Current Allergies: FLOMAX (TAMSULOSIN HCL)  Past Medical History:    Reviewed history from 06/27/2007 and no changes required:       Hyperlipidemia, low LDL       Hypertension       Elev. PSA   Family History:    Reviewed history from 06/27/2007 and no changes required:       mother had lymphoma.  Father is healthy  Social History:    Reviewed history from 06/27/2007 and no changes required:       Occupation: Counsellor              Current Smoker       Regular exercise-no       Married     Physical Exam  General:     Well-developed,well-nourished,in no acute distress; alert,appropriate and cooperative throughout examination Neck:     No deformities, masses, or tenderness noted. Lungs:     Normal respiratory effort, chest expands symmetrically. Lungs are clear to auscultation, no crackles or wheezes. Heart:     Normal rate and regular rhythm. S1 and S2 normal without gallop, murmur, click, rub or other extra sounds. Skin:     R thumb wart    Impression & Recommendations:  Problem # 1:  WARTS, VIRAL, UNSPECIFIED (ICD-078.10)  Orders: Wart Destruct <14 ([address]110)   Procedure: cryo Indication: wart Risks incl. scars, incomplete removal, ect.  and benefits discussed     1  lesion on R medial thumb was  treated with liqid N2 in usual fasion.  Tolerated well. Compl. none. Wound care instructions given.   Problem # 2:  PSA, INCREASED (ICD-790.93) Urol. f/u  Problem # 3:  HYPERTENSION  (ICD-401.9)  His updated medication list for this problem includes:    Benicar Hct 40-12.5 Mg Tabs (Olmesartan medoxomil-hctz) ..... Qd   Problem # 4:  HYPERLIPIDEMIA (ICD-272.4)  His updated medication list for this problem includes:    Niaspan 500 Mg Tbcr (Niacin (antihyperlipidemic)) .Marland Kitchen... 2 qhs   Complete Medication List: 1)  Benicar Hct 40-12.5 Mg Tabs (Olmesartan medoxomil-hctz) .... Qd 2)  Niaspan 500 Mg Tbcr (Niacin (antihyperlipidemic)) .... 2 qhs 3)  Vitamin D3 1000 Unit Tabs (Cholecalciferol) .Marland Kitchen.. 1 qd   Patient Instructions: 1)  Please schedule a follow-up appointment in 6 months. 2)  BMP prior to visit, ICD-9: 3)  Lipid Panel prior to visit, ICD-9: 272.0 4)  TSH prior to visit, ICD-9:    ]

## 2010-11-16 NOTE — Assessment & Plan Note (Signed)
Summary: 6 mo rov/nws   Vital Signs:  Patient Profile:   55 Years Old Male Weight:      199 pounds Temp:     97.9 degrees F oral Pulse rate:   65 / minute BP sitting:   114 / 69  (left arm)  Vitals Entered By: Tora Perches (May 10, 2008 4:19 PM)                 Chief Complaint:  Multiple medical problems or concerns.  History of Present Illness: F/u HTN, hyperlipidemia, elevated glu, R thumb wart,labs to review    Current Allergies (reviewed today): FLOMAX (TAMSULOSIN HCL)  Past Medical History:    Reviewed history from 10/28/2007 and no changes required:       Hyperlipidemia, low LDL       Hypertension       Elev. PSA   Family History:    Reviewed history from 06/27/2007 and no changes required:       mother had lymphoma.  Father is healthy  Social History:    Reviewed history from 10/28/2007 and no changes required:       Occupation: Counsellor              Current Smoker       Regular exercise-no       Married    Review of Systems  The patient denies chest pain and syncope.     Physical Exam  General:     overweight-appearing.   Head:     Normocephalic and atraumatic without obvious abnormalities. No apparent alopecia or balding. Eyes:     No corneal or conjunctival inflammation noted. EOMI. Perrla. Funduscopic exam benign, without hemorrhages, exudates or papilledema. Vision grossly normal. Ears:     External ear exam shows no significant lesions or deformities.  Otoscopic examination reveals clear canals, tympanic membranes are intact bilaterally without bulging, retraction, inflammation or discharge. Hearing is grossly normal bilaterally. Mouth:     Oral mucosa and oropharynx without lesions or exudates.  Teeth in good repair. Neck:     No deformities, masses, or tenderness noted. Lungs:     Normal respiratory effort, chest expands symmetrically. Lungs are clear to auscultation, no crackles or wheezes. Heart:     Normal rate and regular  rhythm. S1 and S2 normal without gallop, murmur, click, rub or other extra sounds. Abdomen:     Bowel sounds positive,abdomen soft and non-tender without masses, organomegaly or hernias noted. Msk:     No deformity or scoliosis noted of thoracic or lumbar spine.   Neurologic:     No cranial nerve deficits noted. Station and gait are normal. Plantar reflexes are down-going bilaterally. DTRs are symmetrical throughout. Sensory, motor and coordinative functions appear intact.    Impression & Recommendations:  Problem # 1:  WARTS, VIRAL, UNSPECIFIED (ICD-078.10) R thumb Assessment: Unchanged Will try condylox. Cryo is hard to accomplish due to pain  Problem # 2:  HYPERTENSION (ICD-401.9) Assessment: Improved  His updated medication list for this problem includes:    Benicar Hct 40-12.5 Mg Tabs (Olmesartan medoxomil-hctz) ..... Qd   Problem # 3:  ABNORMAL GLUCOSE NEC (ICD-790.29) Assessment: Deteriorated AIC and BMET in 3 mo Wt loss and exercise >20 min  Problem # 4:  PSA, INCREASED (ICD-790.93) Assessment: Comment Only Watching  Problem # 5:  HYPERLIPIDEMIA (ICD-272.4) Assessment: Unchanged  His updated medication list for this problem includes:    Niaspan 500 Mg Tbcr (Niacin (antihyperlipidemic)) .Marland Kitchen... 2 qhs  Complete Medication List: 1)  Benicar Hct 40-12.5 Mg Tabs (Olmesartan medoxomil-hctz) .... Qd 2)  Niaspan 500 Mg Tbcr (Niacin (antihyperlipidemic)) .... 2 qhs 3)  Vitamin D3 1000 Unit Tabs (Cholecalciferol) .Marland Kitchen.. 1 qd 4)  Condylox 0.5 % Gel (Podofilox) .... Use once daily as dirrect   Patient Instructions: 1)  Please schedule a follow-up appointment in 6 months. 2)  BMP prior to visit, ICD-9: 3)  Hepatic Panel prior to visit, ICD-9: 4)  Lipid Panel prior to visit, ICD-9: 5)  TSH prior to visit, ICD-9: 6)  CBC w/ Diff prior to visit, ICD-9: 7)  Urine-dip prior to visit, ICD-9: 8)  PSA prior to visit, ICD-9: 729.00  9952 9)  HbgA1C prior to visit, ICD-9: 10)   In 3 mo Hgb A1c and BMET   Prescriptions: CONDYLOX 0.5 %  GEL (PODOFILOX) use once daily as dirrect  #qs x 1   Entered and Authorized by:   Tresa Garter MD   Signed by:   Tresa Garter MD on 05/10/2008   Method used:   Print then Give to Patient   RxID:   [id number]  ]

## 2010-11-16 NOTE — Assessment & Plan Note (Signed)
Summary: 6 MO ROV /NWS  $50   Vital Signs:  Patient Profile:   55 Years Old Male Weight:      205 pounds Temp:     97.6 degrees F oral Pulse rate:   76 / minute BP sitting:   134 / 86  (left arm)  Vitals Entered By: Tora Perches (November 09, 2008 8:02 AM)                 Chief Complaint:  Multiple medical problems or concerns.  History of Present Illness: The patient presents for a follow up of hypertension, elev glu, hyperlipidemia C/o occ LBP    Prior Medications Reviewed Using: Patient Recall  Updated Prior Medication List: BENICAR HCT 40-12.5 MG TABS (OLMESARTAN MEDOXOMIL-HCTZ) qd VITAMIN D3 1000 UNIT  TABS (CHOLECALCIFEROL) 1 qd IBUPROFEN 600 MG  TABS (IBUPROFEN) take 1 three times a day qfter meals as needed for pain FLUCONAZOLE 100 MG TABS (FLUCONAZOLE) 1 weekly  Current Allergies (reviewed today): FLOMAX (TAMSULOSIN HCL)  Past Medical History:    Reviewed history from 10/28/2007 and no changes required:       Hyperlipidemia, low LDL       Hypertension       Elev. PSA   Family History:    Reviewed history from 06/27/2007 and no changes required:       mother had lymphoma.  Father is healthy  Social History:    Reviewed history from 10/28/2007 and no changes required:       Occupation: Counsellor              Current Smoker       Regular exercise-no       Married    Review of Systems  The patient denies fever, chest pain, and dyspnea on exertion.     Physical Exam  General:     NAD Ears:     External ear exam shows no significant lesions or deformities.  Otoscopic examination reveals clear canals, tympanic membranes are intact bilaterally without bulging, retraction, inflammation or discharge. Hearing is grossly normal bilaterally. Nose:     External nasal examination shows no deformity or inflammation. Nasal mucosa are pink and moist without lesions or exudates. Mouth:     Oral mucosa and oropharynx without lesions or exudates.  Teeth in  good repair. Neck:     Cervical spine is tender to palpation over paraspinal muscles and with the ROM  Lungs:     CTA Heart:     RRR Abdomen:     S/NT Neurologic:     No cranial nerve deficits noted. Station and gait are normal. Plantar reflexes are down-going bilaterally. DTRs are symmetrical throughout. Sensory, motor and coordinative functions appear intact. Skin:     Clear Psych:     Cognition and judgment appear intact. Alert and cooperative with normal attention span and concentration. No apparent delusions, illusions, hallucinations    Impression & Recommendations:  Problem # 1:  HYPERTENSION (ICD-401.9) Assessment: Comment Only  His updated medication list for this problem includes:    Benicar Hct 40-12.5 Mg Tabs (Olmesartan medoxomil-hctz) .Marland Kitchen... 1 po qd   Problem # 2:  HYPERLIPIDEMIA (ICD-272.4) Assessment: Comment Only  The following medications were removed from the medication list:    Niaspan 500 Mg Tbcr (Niacin (antihyperlipidemic)) .Marland Kitchen... 2 qhs   Problem # 3:  CERVICAL RADICULOPATHY (ICD-722.71) Assessment: Unchanged On prescription therapy   Problem # 4:  PSA, INCREASED (ICD-790.93) Assessment: Improved Watching  Complete Medication List:  1)  Benicar Hct 40-12.5 Mg Tabs (Olmesartan medoxomil-hctz) .Marland Kitchen.. 1 po qd 2)  Ibuprofen 800 Mg Tabs (Ibuprofen) .Marland Kitchen.. 1 by mouth two times a day as needed pain 3)  Vitamin D3 1000 Unit Tabs (Cholecalciferol) .Marland Kitchen.. 1 qd 4)  Viagra 100 Mg Tabs (Sildenafil citrate) .Marland Kitchen.. 1 by mouth once daily prn   Patient Instructions: 1)  Please schedule a follow-up appointment in 4 months. 2)  BMP prior to visit, ICD-9:401.1 790.29 3)  HbgA1C prior to visit, ICD-9:   Prescriptions: IBUPROFEN 800 MG TABS (IBUPROFEN) 1 by mouth two times a day as needed pain  #180 x 1   Entered and Authorized by:   Tresa Garter MD   Signed by:   Tresa Garter MD on 11/09/2008   Method used:   Print then Give to Patient   RxID:    [id number] BENICAR HCT 40-12.5 MG TABS (OLMESARTAN MEDOXOMIL-HCTZ) 1 po qd  #90 x 3   Entered and Authorized by:   Tresa Garter MD   Signed by:   Tresa Garter MD on 11/09/2008   Method used:   Print then Give to Patient   RxID:   [id number] IBUPROFEN 800 MG TABS (IBUPROFEN) 1 by mouth two times a day as needed pain  #60 x 6   Entered and Authorized by:   Tresa Garter MD   Signed by:   Tresa Garter MD on 11/09/2008   Method used:   Print then Give to Patient   RxID:   [id number] VIAGRA 100 MG TABS (SILDENAFIL CITRATE) 1 by mouth once daily prn  #12 x 6   Entered and Authorized by:   Tresa Garter MD   Signed by:   Tresa Garter MD on 11/09/2008   Method used:   Print then Give to Patient   RxID:   [id number] IBUPROFEN 800 MG TABS (IBUPROFEN) 1 by mouth two times a day as needed pain  #60 x 3   Entered and Authorized by:   Tresa Garter MD   Signed by:   Tresa Garter MD on 11/09/2008   Method used:   Electronically to        [facility]  [phone]* (retail)       [address] [place], [place]  [address]       Ph: [phone] or [phone]       Fax: [phone]   RxID:   [id number]

## 2010-11-16 NOTE — Assessment & Plan Note (Signed)
Summary: 3 MO ROV /NWS $50 RS'D PER PT/NWS   Vital Signs:  Patient profile:   55 year old male Height:      65 inches Weight:      214 pounds BMI:     35.74 O2 Sat:      97 % Temp:     97.0 degrees F oral Pulse rate:   67 / minute Pulse rhythm:   regular BP sitting:   130 / 84  (left arm) Cuff size:   large  Vitals Entered By: Rock Nephew CMA (August 15, 2009 1:29 PM) CC: follow-up visit Is Patient Diabetic? No   CC:  follow-up visit.  History of Present Illness: The patient presents for a follow up of hypertension, diabetes, hyperlipidemia, OA C/o onycho B feet - bad   Current Medications (verified): 1)  Benicar Hct 40-12.5 Mg Tabs (Olmesartan Medoxomil-Hctz) .Marland Kitchen.. 1 Po Qd 2)  Ibuprofen 800 Mg Tabs (Ibuprofen) .Marland Kitchen.. 1 By Mouth Two Times A Day As Needed Pain 3)  Vitamin D3 1000 Unit  Tabs (Cholecalciferol) .Marland Kitchen.. 1 Qd 4)  Viagra 100 Mg Tabs (Sildenafil Citrate) .Marland Kitchen.. 1 By Mouth Once Daily Prn  Allergies (verified): 1)  ! 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 PSA Elev glu  Osteoarthritis Low back pain  Physical Exam  General:  NAD overweight-appearing.   Eyes:  No corneal or conjunctival inflammation noted. EOMI. Perrla. Funduscopic exam benign, without hemorrhages, exudates or papilledema. Vision grossly normal. Nose:  External nasal examination shows no deformity or inflammation. Nasal mucosa are pink and moist without lesions or exudates. Mouth:  Oral mucosa and oropharynx without lesions or exudates.  Teeth in good repair. Lungs:  CTA Heart:  RRR Abdomen:  S/NT Msk:  No deformity or scoliosis noted of thoracic or lumbar spine.   Neurologic:  No cranial nerve deficits noted. Station and gait are normal. Plantar reflexes are down-going bilaterally. DTRs are  symmetrical throughout. Sensory, motor and coordinative functions appear intact. Skin:  R groin 1.2 cm abscess Psych:  Cognition and judgment appear intact. Alert and cooperative with normal attention span and concentration. No apparent delusions, illusions, hallucinations   Impression & Recommendations:  Problem # 1:  HYPERTENSION (ICD-401.9) Assessment Unchanged  His updated medication list for this problem includes:    Benicar Hct 40-12.5 Mg Tabs (Olmesartan medoxomil-hctz) .Marland Kitchen... 1 po qd  BP today: 130/84 Prior BP: 124/80 (03/29/2009)  Labs Reviewed: K+: 4.5 (03/28/2009) Creat: : 0.9 (03/28/2009)   Chol: 134 (11/05/2008)   HDL: 24.8 (11/05/2008)   LDL: 77 (11/05/2008)   TG: 160 (11/05/2008)  Problem # 2:  ABNORMAL GLUCOSE NEC (ICD-790.29) Assessment: Comment Only  Labs Reviewed: Creat: 0.9 (03/28/2009)     Problem # 3:  ONYCHOMYCOSIS (ICD-110.1) Assessment: Deteriorated  His updated medication list for this problem includes:    Lamisil At 1 % Crea (Terbinafine hcl) ..... Use bid  Orders: Podiatry Referral (Podiatry)  Problem # 4:  LOW BACK PAIN (ICD-724.2) Assessment: Improved  His updated medication list for this problem includes:    Ibuprofen 800 Mg Tabs (Ibuprofen) .Marland Kitchen... 1 by mouth two times a day as needed pain  Problem # 5:  HYPERLIPIDEMIA (ICD-272.4) Assessment: Comment Only  Labs Reviewed: SGOT: 18 (11/05/2008)   SGPT: 37 (11/05/2008)   HDL:24.8 (11/05/2008), 24.8 (05/07/2008)  LDL:77 (11/05/2008), 100 (62/95/2841)  Chol:134 (11/05/2008),  147 (05/07/2008)  Trig:160 (11/05/2008), 111 (05/07/2008)  Complete Medication List: 1)  Benicar Hct 40-12.5 Mg Tabs (Olmesartan medoxomil-hctz) .Marland Kitchen.. 1 po qd 2)  Ibuprofen 800 Mg Tabs (Ibuprofen) .Marland Kitchen.. 1 by mouth two times a day as needed pain 3)  Vitamin D3 1000 Unit Tabs (Cholecalciferol) .Marland Kitchen.. 1 qd 4)  Viagra 100 Mg Tabs (Sildenafil citrate) .Marland Kitchen.. 1 by mouth once daily prn 5)  Lamisil At 1 % Crea (Terbinafine hcl) ....  Use bid  Patient Instructions: 1)  Try to eat more raw plant food, fresh and dry fruit, raw almonds, leafy vegetables, whole foods and less red meat, less animal fat. Poultry and fish is better for you than pork and beef. Avoid processed foods (canned soups, hot dogs, sausage, bacon , frozen dinners). Avoid corn syrup, high fructose syrup or aspartam and Splenda  containing drinks. Honey, Agave and Stevia are better sweeteners. Make your own  dressing with olive oil, wine vinegar, lemon juce, garlic etc. for your salads. 2)  Please schedule a follow-up appointment in 4 months. 3)  BMP prior to visit, ICD-9:995.20   790.29 4)  Hepatic Panel prior to visit, ICD-9: 5)  Urine-dip prior to visit, ICD-9: 6)  HbgA1C prior to visit, ICD-9: 7)  Uric acid  8)  PSA Prescriptions: VIAGRA 100 MG TABS (SILDENAFIL CITRATE) 1 by mouth once daily prn  #12 x 6   Entered and Authorized by:   Tresa Garter MD   Signed by:   Tresa Garter MD on 08/15/2009   Method used:   Print then Give to Patient   RxID:   [id number] IBUPROFEN 800 MG TABS (IBUPROFEN) 1 by mouth two times a day as needed pain  #180 x 1   Entered and Authorized by:   Tresa Garter MD   Signed by:   Tresa Garter MD on 08/15/2009   Method used:   Print then Give to Patient   RxID:   [id number] BENICAR HCT 40-12.5 MG TABS (OLMESARTAN MEDOXOMIL-HCTZ) 1 po qd  #90 x 3   Entered and Authorized by:   Tresa Garter MD   Signed by:   Tresa Garter MD on 08/15/2009   Method used:   Print then Give to Patient   RxID:   [id number] LAMISIL AT 1 % CREA (TERBINAFINE HCL) use bid  #60 g x 2   Entered and Authorized by:   Tresa Garter MD   Signed by:   Tresa Garter MD on 08/15/2009   Method used:   Print then Give to Patient   RxID:   [id number]

## 2010-11-16 NOTE — Miscellaneous (Signed)
Summary: benicar  Clinical Lists Changes  Medications: Rx of BENICAR HCT 40-12.5 MG TABS (OLMESARTAN MEDOXOMIL-HCTZ) qd;  #30 x 12;  Signed;  Entered by: Tora Perches;  Authorized by: Tresa Garter MD;  Method used: Electronic    Prescriptions: BENICAR HCT 40-12.5 MG TABS (OLMESARTAN MEDOXOMIL-HCTZ) qd  #30 x 12   Entered by:   Tora Perches   Authorized by:   Tresa Garter MD   Signed by:   Tora Perches on 12/18/2007   Method used:   Electronically sent to ...       [facility]  [phone]*       [address]       [place], [place]  [address]       Ph: [phone] or [phone]       Fax: [phone]   RxID:   [id number]

## 2010-11-16 NOTE — Assessment & Plan Note (Signed)
Summary: drill bit cut finger, rusty drill/may need tetanus shot/plot/cd   Vital Signs:  Patient profile:   55 year old male Height:      65 inches Weight:      209 pounds BMI:     34.91 O2 Sat:      97 % on Room air Temp:     98.7 degrees F oral Pulse rate:   72 / minute Pulse rhythm:   regular BP sitting:   138 / 82  (left arm) Cuff size:   large  Vitals Entered By: Rock Nephew CMA (September 15, 2009 3:31 PM)  O2 Flow:  Room air CC: finger injury Is Patient Diabetic? No   Primary Care Barbi Kumagai:  Tresa Garter MD  CC:  finger injury.  History of Present Illness: He comes in c/o an injury to his right index finger 24 hours ago. He was drilling into a fence when the screw went into the finger. He says there is a flap of skin but no pain, redness, paresthesias, or FB. He does not want an Xray done.  Preventive Screening-Counseling & Management  Alcohol-Tobacco     Smoking Status: current     Smoking Cessation Counseling: yes  Current Medications (verified): 1)  Benicar Hct 40-12.5 Mg Tabs (Olmesartan Medoxomil-Hctz) .Marland Kitchen.. 1 Po Qd 2)  Ibuprofen 800 Mg Tabs (Ibuprofen) .Marland Kitchen.. 1 By Mouth Two Times A Day As Needed Pain 3)  Vitamin D3 1000 Unit  Tabs (Cholecalciferol) .Marland Kitchen.. 1 Qd 4)  Viagra 100 Mg Tabs (Sildenafil Citrate) .Marland Kitchen.. 1 By Mouth Once Daily Prn 5)  Lamisil At 1 % Crea (Terbinafine Hcl) .... Use Bid  Allergies (verified): 1)  ! Codeine Sulfate (Codeine Sulfate) 2)  Flomax  Past History:  Past Medical History: Reviewed history from 08/15/2009 and no changes required. Hyperlipidemia, low LDL Hypertension Elev. PSA Elev glu  Osteoarthritis Low back pain  Family History: Reviewed history from 03/29/2009 and no changes required. mother had lymphoma.  Father is healthy M colon Ca at 100  Social History: Reviewed history from 10/28/2007 and no changes required. Occupation: Counsellor  Current Smoker Regular exercise-no Married  Review of Systems  The  patient denies fever, chest pain, abdominal pain, and enlarged lymph nodes.    Physical Exam  Msk:  right index finger has a .75 cm v-shaped superficial flap of skin over the radial side of the distal phalanx.  the flap is viable and there is no necrotic skin or sq tissue there is no debris or fb and no boney derangement. he has good flexion and extension at the DIP joint and good sensation and capillary refill distally.   Impression & Recommendations:  Problem # 1:  LACERATION, FINGER (ICD-883.0) he has presented too late for suture repair, today I cleaned it out, applied antibiotic ointment and a dressing. he will need antibiotic coverage.  Complete Medication List: 1)  Benicar Hct 40-12.5 Mg Tabs (Olmesartan medoxomil-hctz) .Marland Kitchen.. 1 po qd 2)  Ibuprofen 800 Mg Tabs (Ibuprofen) .Marland Kitchen.. 1 by mouth two times a day as needed pain 3)  Vitamin D3 1000 Unit Tabs (Cholecalciferol) .Marland Kitchen.. 1 qd 4)  Viagra 100 Mg Tabs (Sildenafil citrate) .Marland Kitchen.. 1 by mouth once daily prn 5)  Lamisil At 1 % Crea (Terbinafine hcl) .... Use bid 6)  Ceftin 500 Mg Tab (Cefuroxime axetil) .... Take one (1) tablet by mouth two (2) times a day x 10 days  Other Orders: Tdap => 33yrs IM ([address]) Admin 1st Vaccine (60454)  Patient Instructions: 1)  Please schedule a follow-up appointment in 2 weeks. Notify me of any pain, redness, swelling, drainage, or fever. Prescriptions: CEFTIN 500 MG TAB (CEFUROXIME AXETIL) Take one (1) tablet by mouth two (2) times a day X 10 days  #20 x 0   Entered and Authorized by:   Etta Grandchild MD   Signed by:   Etta Grandchild MD on 09/15/2009   Method used:   [facility] to        [facility]  [address]  [phone]* (retail)       [address] [place], [place]  [address]       Ph: [phone] or [phone]       Fax: [phone]   RxID:   [id number]    Immunizations Administered:  Tetanus Vaccine:    Vaccine Type: Tdap    Site: right deltoid    Mfr: [employer]    Dose:  0.5 ml    Route: IM    Given by: Rock Nephew CMA    Exp. Date: 12/10/2011    Lot #: [id number]    VIS given: 09/02/07 version given September 15, 2009.

## 2010-11-16 NOTE — Assessment & Plan Note (Signed)
Summary: 4 MO ROV /NWS #    Vital Signs:  Patient profile:   55 year old male Height:      65 inches Weight:      211.75 pounds BMI:     35.36 O2 Sat:      97 % on Room air Temp:     98.3 degrees F oral Pulse rate:   67 / minute BP sitting:   134 / 82  (left arm) Cuff size:   large  Vitals Entered By: Lucious Groves (Feb 15, 2010 2:59 PM)  O2 Flow:  Room air CC: 4 mo rtn ov./kb Is Patient Diabetic? No Pain Assessment Patient in pain? no      CBG Result 120   Primary Care Provider:  Georgina Quint Teshaun Olarte MD  CC:  4 mo rtn ov./kb.  History of Present Illness: The patient presents for a follow up of hypertension, elevated glucose, hyperlipidemia   Current Medications (verified): 1)  Benicar Hct 40-12.5 Mg Tabs (Olmesartan Medoxomil-Hctz) .Marland Kitchen.. 1 Po Qd 2)  Ibuprofen 800 Mg Tabs (Ibuprofen) .Marland Kitchen.. 1 By Mouth Two Times A Day As Needed Pain 3)  Vitamin D3 1000 Unit  Tabs (Cholecalciferol) .Marland Kitchen.. 1 Qd 4)  Viagra 100 Mg Tabs (Sildenafil Citrate) .Marland Kitchen.. 1 By Mouth Once Daily Prn 5)  Clotrimazole-Betamethasone 1-0.05 % Crea (Clotrimazole-Betamethasone) .... Use As Directed  Allergies (verified): 1)  ! Codeine Sulfate (Codeine Sulfate) 2)  Flomax  Past History:  Past Medical History: Last updated: 08/15/2009 Hyperlipidemia, low LDL Hypertension Elev. PSA Elev glu  Osteoarthritis Low back pain  Social History: Last updated: 10/28/2007 Occupation: Counsellor  Current Smoker Regular exercise-no Married  Review of Systems  The patient denies anorexia, weight loss, weight gain, chest pain, dyspnea on exertion, and abdominal pain.    Physical Exam  General:  NAD overweight-appearing.   Head:  Normocephalic and atraumatic without obvious abnormalities. No apparent alopecia or balding. Nose:  External nasal examination shows no deformity or inflammation. Nasal mucosa are pink and moist without lesions or exudates. Mouth:  Oral mucosa and oropharynx without lesions or  exudates.  Teeth in good repair. Lungs:  CTA Heart:  RRR Abdomen:  S/NT Msk:  right index finger has a .75 cm v-shaped superficial flap of skin over the radial side of the distal phalanx.  the flap is viable and there is no necrotic skin or sq tissue there is no debris or fb and no boney derangement. he has good flexion and extension at the DIP joint and good sensation and capillary refill distally. Neurologic:  No cranial nerve deficits noted. Station and gait are normal. Plantar reflexes are down-going bilaterally. DTRs are symmetrical throughout. Sensory, motor and coordinative functions appear intact. Skin:  no rash Psych:  Cognition and judgment appear intact. Alert and cooperative with normal attention span and concentration. No apparent delusions, illusions, hallucinations   Impression & Recommendations:  Problem # 1:  ABNORMAL GLUCOSE NEC (ICD-790.29) Assessment Deteriorated Accu check work demonstrated His updated medication list for this problem includes:    Metformin Hcl 500 Mg Tabs (Metformin hcl) .Marland Kitchen... 1 by mouth once daily for sugar problem  Labs Reviewed: Creat: 0.8 (02/13/2010)     Orders: Capillary Blood Glucose/CBG (14782)  Problem # 2:  LOW BACK PAIN (ICD-724.2) Assessment: Unchanged  His updated medication list for this problem includes:    Ibuprofen 800 Mg Tabs (Ibuprofen) .Marland Kitchen... 1 by mouth two times a day as needed pain    Aspirin 81 Mg Tbec (Aspirin) .Marland KitchenMarland KitchenMarland KitchenMarland Kitchen  1 by mouth qd  Problem # 3:  HYPERTENSION (ICD-401.9) Assessment: Improved  His updated medication list for this problem includes:    Benicar Hct 40-12.5 Mg Tabs (Olmesartan medoxomil-hctz) .Marland Kitchen... 1 po qd  Problem # 4:  HYPERLIPIDEMIA (ICD-272.4) Assessment: Comment Only On diet  Complete Medication List: 1)  Benicar Hct 40-12.5 Mg Tabs (Olmesartan medoxomil-hctz) .Marland Kitchen.. 1 po qd 2)  Ibuprofen 800 Mg Tabs (Ibuprofen) .Marland Kitchen.. 1 by mouth two times a day as needed pain 3)  Vitamin D3 1000 Unit Tabs  (Cholecalciferol) .Marland Kitchen.. 1 qd 4)  Viagra 100 Mg Tabs (Sildenafil citrate) .Marland Kitchen.. 1 by mouth once daily prn 5)  Clotrimazole-betamethasone 1-0.05 % Crea (Clotrimazole-betamethasone) .... Use as directed 6)  Metformin Hcl 500 Mg Tabs (Metformin hcl) .Marland Kitchen.. 1 by mouth once daily for sugar problem 7)  Aspirin 81 Mg Tbec (Aspirin) .Marland Kitchen.. 1 by mouth qd  Patient Instructions: 1)  Learn to use Accu Check 2)  Loose 10-15 lbs 3)  Please schedule a follow-up appointment in 3 months. 4)  BMP prior to visit, ICD-9: 5)  Hepatic Panel prior to visit, ICD-9: 6)  HbgA1C prior to visit, ICD-9: 790.29 7)  Lipid Panel prior to visit, ICD-9: 8)  PSA prior to visit, ICD-9: 596.0 Prescriptions: METFORMIN HCL 500 MG TABS (METFORMIN HCL) 1 by mouth once daily for sugar problem  #90 x 3   Entered and Authorized by:   Tresa Garter MD   Signed by:   Tresa Garter MD on 02/15/2010   Method used:   Print then Give to Patient   RxID:   [id number]

## 2010-11-16 NOTE — Assessment & Plan Note (Signed)
Summary: PLACE ON LEG NOT HEALING /NWS   Vital Signs:  Patient profile:   55 year old male Height:      66 inches Weight:      206.50 pounds BMI:     33.45 O2 Sat:      96 % on Room air Temp:     98.2 degrees F oral Pulse rate:   72 / minute BP sitting:   112 / 80  (left arm) Cuff size:   large  Vitals Entered ByZella Ball Ewing (Mar 07, 2010 7:52 AM)  O2 Flow:  Room air CC: Right leg sore not healing/RE   Primary Care Provider:  Georgina Quint Tranquilino Fischler MD  CC:  Right leg sore not healing/RE.  History of Present Illness: C/o itchy spot on R lower led - itch x 1 wk. There was one like it 4 years ago and left a scar.  Current Medications (verified): 1)  Benicar Hct 40-12.5 Mg Tabs (Olmesartan Medoxomil-Hctz) .Marland Kitchen.. 1 Po Qd 2)  Ibuprofen 800 Mg Tabs (Ibuprofen) .Marland Kitchen.. 1 By Mouth Two Times A Day As Needed Pain 3)  Vitamin D3 1000 Unit  Tabs (Cholecalciferol) .Marland Kitchen.. 1 Qd 4)  Viagra 100 Mg Tabs (Sildenafil Citrate) .Marland Kitchen.. 1 By Mouth Once Daily Prn 5)  Clotrimazole-Betamethasone 1-0.05 % Crea (Clotrimazole-Betamethasone) .... Use As Directed 6)  Metformin Hcl 500 Mg Tabs (Metformin Hcl) .Marland Kitchen.. 1 By Mouth Once Daily For Sugar Problem 7)  Aspirin 81 Mg Tbec (Aspirin) .Marland Kitchen.. 1 By Mouth Qd  Allergies (verified): 1)  ! Codeine Sulfate (Codeine Sulfate) 2)  Flomax  Past History:  Past Medical History: Last updated: 08/15/2009 Hyperlipidemia, low LDL Hypertension Elev. 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 1 po qd 2)  Ibuprofen 800 Mg Tabs (Ibuprofen) .Marland Kitchen.. 1 by mouth two times a day as needed pain 3)  Vitamin D3 1000 Unit Tabs (Cholecalciferol) .Marland Kitchen.. 1 qd 4)  Viagra 100 Mg Tabs (Sildenafil citrate) .Marland Kitchen.. 1 by mouth once daily prn 5)  Clotrimazole-betamethasone 1-0.05 % Crea (Clotrimazole-betamethasone) .... Use as directed 6)  Metformin Hcl 500 Mg Tabs (Metformin hcl) .Marland Kitchen.. 1 by mouth once daily for sugar problem 7)  Aspirin 81 Mg Tbec (Aspirin) .Marland Kitchen.. 1 by mouth qd 8)  Triamcinolone Acetonide 0.5 % Oint (Triamcinolone acetonide) .... Use two times a day as needed on rash 9)  Hydroxyzine Hcl 25 Mg Tabs (Hydroxyzine hcl) .Marland Kitchen.. 1-2 tabs by mouth two times a day as needed itching 10)  Accu-check Aviva Test Strips  .... Use once daily prn  Patient Instructions: 1)  Keep return office visit  Prescriptions: ACCU-CHECK AVIVA TEST STRIPS use once daily prn  #50 x 12   Entered and Authorized by:   Tresa Garter MD   Signed by:   Tresa Garter MD on 03/07/2010   Method used:   Print then Give to Patient   RxID:   [id number] ACCU-CHEK ADVANTAGE TEST  STRP (GLUCOSE BLOOD) use  once daily prn  #50 x 12   Entered and Authorized by:   Tresa Garter MD   Signed by:   Tresa Garter MD on 03/07/2010   Method used:   Print then Give to Patient   RxID:   [id number] HYDROXYZINE HCL 25 MG TABS (HYDROXYZINE HCL) 1-2 tabs by mouth two times a day as needed itching  #60 x 0   Entered and Authorized by:   Tresa Garter MD   Signed by:   Tresa Garter MD on 03/07/2010   Method used:   Electronically to        [facility]  [phone]* (retail)       [address] [place], [place]  [address]       Ph: [phone] or [phone]       Fax: [phone]   RxID:   [id number] TRIAMCINOLONE ACETONIDE 0.5 % OINT (TRIAMCINOLONE ACETONIDE) use two times a day as needed on rash  #60g x  3   Entered and Authorized by:   Tresa Garter MD   Signed by:   Tresa Garter MD on 03/07/2010   Method used:   Electronically to        [facility]  [phone]* (retail)       [address] [place], [place]  [address]       Ph: [phone] or [phone]       Fax: [phone]   RxID:   [id number]

## 2010-11-16 NOTE — Progress Notes (Signed)
Summary: REFILL  Phone Note Call from Patient Call back at [phone]   Summary of Call: Patient is requesting refill of clota/metamethasone 1% cream (45g) for recurrent rash. OK?  Initial call taken by: Lamar Sprinkles, CMA,  November 25, 2009 10:26 AM  Follow-up for Phone Call        ok 3 ref Follow-up by: Tresa Garter MD,  November 25, 2009 1:09 PM  Additional Follow-up for Phone Call Additional follow up Details #1::        Pt informed  Additional Follow-up by: Lamar Sprinkles, CMA,  November 25, 2009 1:13 PM    New/Updated Medications: CLOTRIMAZOLE-BETAMETHASONE 1-0.05 % CREA (CLOTRIMAZOLE-BETAMETHASONE) use as directed Prescriptions: CLOTRIMAZOLE-BETAMETHASONE 1-0.05 % CREA (CLOTRIMAZOLE-BETAMETHASONE) use as directed  #45 g x 3   Entered by:   Lamar Sprinkles, CMA   Authorized by:   Tresa Garter MD   Signed by:   Lamar Sprinkles, CMA on 11/25/2009   Method used:   Electronically to        [facility]  [phone]* (retail)       [address] [place], [place]  [address]       Ph: [phone] or [phone]       Fax: [phone]   RxID:   [id number]

## 2010-11-16 NOTE — Progress Notes (Signed)
Summary: Refill--Viagra  ---- Converted from flag ---- ---- 04/05/2010 11:14 AM, Lucious Groves wrote: Failed RefillResponse: ePrescribing Error Acknowledgement;  Dr Name: Valda Favia. UIB trace number is invalid.;  [facility]  [address]  [phone]*;  Medication: VIAGRA 100 MG TABLET;  Instructions: TAKE 1 TABLET EVERY DAY AS NEEDED;  Quantity: 12 Tablet;  Refills: 6;  Entry Date: [id number];  Transaction ID: [id number];   ------------------------------  Prescriptions: VIAGRA 100 MG TABS (SILDENAFIL CITRATE) 1 by mouth once daily prn  #12 Tablet x 6   Entered by:   Lucious Groves   Authorized by:   Tresa Garter MD   Signed by:   Lucious Groves on 04/10/2010   Method used:   Faxed to ...       [facility]  [phone]* (retail)       [address] [place], [place]  [address]       Ph: [phone] or [phone]       Fax: [phone]   RxID:   [id number]

## 2010-11-16 NOTE — Progress Notes (Signed)
Summary[place]FILL [place] of state  Phone Note Call from Patient Call back at Home Phone [phone] Call back at [phone]   Caller: Patient Call For: [name] Garter MD Summary of Call: Pt in [place], left bp pills home, can he get a few called into [facility], [phone] in [place], [place]. Initial call taken by: Verdell Face,  October 28, 2009 12:23 PM  Follow-up for Phone Call        Pt informed, rx sent in Follow-up by: Lamar Sprinkles, CMA,  October 28, 2009 1:25 PM    Prescriptions: BENICAR HCT 40-12.5 MG TABS (OLMESARTAN MEDOXOMIL-HCTZ) 1 po qd  #14 x 0   Entered by:   Lamar Sprinkles, CMA   Authorized by:   [name] Garter MD   Signed by:   Lamar Sprinkles, CMA on 10/28/2009   Method used:   Historical   RxID:   [id number]

## 2011-01-26 ENCOUNTER — Other Ambulatory Visit: Payer: Self-pay

## 2011-01-26 ENCOUNTER — Other Ambulatory Visit: Payer: Self-pay | Admitting: Internal Medicine

## 2011-01-26 DIAGNOSIS — R7309 Other abnormal glucose: Secondary | ICD-10-CM

## 2011-02-02 ENCOUNTER — Ambulatory Visit: Payer: Self-pay | Admitting: Internal Medicine

## 2011-02-02 DIAGNOSIS — Z0289 Encounter for other administrative examinations: Secondary | ICD-10-CM

## 2011-02-27 NOTE — Assessment & Plan Note (Signed)
[facility]                                 ON-CALL NOTE   NAME:[name], [name]                       MRN:          [id number]  DATE:04/12/2007                            DOB:          1956/04/03    Patient of Dr. Posey Rea.  Phone call at 8:48 a.m. on June 28.   PHONE NUMBER:  [phone]   Mr. Moller has had a sore throat and he wanted to come in to the  clinic, so I gave him an appointment.     Karie Schwalbe, MD  Electronically Signed    RIL/MedQ  DD: 04/12/2007  DT: 04/12/2007  Job #: [id number]   cc:   Georgina Quint. Plotnikov, MD

## 2011-03-02 NOTE — Assessment & Plan Note (Signed)
[facility]                             PRIMARY CARE OFFICE NOTE   NAME:[name] [name], [name] [name]                       MRN:          [id number]  DATE:07/11/2006                            DOB:          06/13/1956    PROCEDURE:  Cryosurgery.   INDICATION:  Wart on the thumb.   The scab after previous procedure has fallen off.  We will have to re-treat  what is left under the scab.  The wart on the thumb was treated with liquid  nitrogen in the usual fashion.  He tolerated well.   COMPLICATIONS:  None.            ______________________________  Georgina Quint. Plotnikov, MD      AVP/MedQ  DD:  07/11/2006  DT:  07/13/2006  Job #:  [id number]

## 2011-03-02 NOTE — Procedures (Signed)
[facility]                                  PROCEDURE NOTE   NAME:[name], [name]                       MRN:          [id number]  DATE:06/25/2006                            DOB:          10-30-1955    PROCEDURE:  Cryosurgery.   INDICATIONS:  Refractory wart on the right thumb.   The wart was treated in the usual fashion with liquid nitrogen.  He  tolerated it well.   COMPLICATIONS:  None.                                   Georgina Quint. Plotnikov, MD   AVP/MedQ  DD:  06/26/2006  DT:  06/27/2006  Job #:  [id number]

## 2011-03-02 NOTE — Assessment & Plan Note (Signed)
[facility]                             PRIMARY CARE OFFICE NOTE   NAME:Gladhill, Jarryn                       MRN:          [id number]  DATE:06/25/2006                            DOB:          11/08/1955    The patient is a 55 year old male who presents for a wellness examination.   PAST MEDICAL HISTORY/FAMILY HISTORY/SOCIAL HISTORY:  As per April 10, 2000.   ALLERGIES:  None.   REVIEW OF SYSTEMS:  He continues to smoke 3 packs per week.  Denies chest  pain or shortness of breath.  No syncope. Warts on the right thumb got  better.  No blood in the stool.  The rest is negative.   PHYSICAL EXAMINATION:  VITAL SIGNS:  Blood pressure 135/84, pulse 58,  temperature 99.9, weight 199 pounds.  GENERAL:  He is in no acute distress.  HEENT:  Moist mucosa.  NECK:  Supple.  No thyromegaly or bruits.  LUNGS:  Clear.  No wheezes or rales.  HEART:  S1, S2.  No murmur, no gallop.  ABDOMEN:  Soft, nontender.  No organomegaly, no masses felt.  LOWER EXTREMITIES: Without edema.  NEUROLOGIC:  He is alert, oriented, cooperative.  Denies being depressed.  SKIN:  There is a growth on his nose.  RECTAL:  Reveals normal prostate.  Rectum without masses.  Stool positive  guaiac.   LABORATORIES:  On June 24, 2006, CBC normal.  Glucose 109.  A1c 5.5%.  Cholesterol 130, LDL 90.  TSH normal.  PSA 2.1.  Urinalysis normal.   ASSESSMENT AND PLAN:  Normal wellness examination.  Age/health-related  issues discussed. Healthy lifestyle discussed.  EKG today is normal.  Obtain  colonoscopy.  Repeat exam in 12 months.                                   Georgina Quint. Plotnikov, MD   AVP/MedQ  DD:  06/26/2006  DT:  06/27/2006  Job #:  [id number]

## 2011-03-02 NOTE — Assessment & Plan Note (Signed)
[facility]                           PRIMARY CARE OFFICE NOTE   NAME:Hoban, Lisa                       MRN:          [id number]  DATE:12/24/2006                            DOB:          1956/05/03    PROCEDURE:  Cryosurgery.   INDICATIONS FOR PROCEDURE:  Common warts on the hand.   PROCEDURE:  Two warts were treated in the usual fashion with liquid  nitrogen. He tolerated it well. There were no complications. Recent  benefits were explained to the patient in detail prior to the procedure  as before.     Georgina Quint. Plotnikov, MD  Electronically Signed    AVP/MedQ  DD: 12/28/2006  DT: 12/30/2006  Job #: [id number]

## 2011-03-02 NOTE — Assessment & Plan Note (Signed)
[facility]                             PRIMARY CARE OFFICE NOTE   NAME:[name], [name]                       MRN:          [id number]  DATE:06/25/2006                            DOB:          1956-08-08    I need to address problems with hypertension, skin lesions, side effects  with Flomax (dry ejaculation), dyslipidemia, warts, and guaiac positive  stool.   PAST MEDICAL HISTORY/FAMILY HISTORY/SOCIAL HISTORY:  As per April 10, 2000.   ALLERGIES:  None.   MEDICATIONS:  Reviewed with the patient.   REVIEW OF SYSTEMS:  As above.  The rest is negative.   PHYSICAL EXAMINATION:  VITAL SIGNS:  Blood pressure 135/84, pulse 58,  temperature 99.9, weight 199 pounds.  GENERAL:  He is in no acute distress.  HEENT:  With moist mucosa.  NECK:  Supple.  No thyromegaly or bruits.  LUNGS:  Clear.  No wheezes or rales.  HEART:  S1, S2.  No murmur, no gallop.  ABDOMEN:  Soft, nontender.  No organomegaly, no masses felt.  LOWER EXTREMITIES: Without edema.  NEUROLOGIC:  He is alert, oriented, cooperative.  Denies being depressed.  RECTAL:  Reveals normal prostate.  Stool positive guaiac.  No rectal masses.   LABORATORIES:  On June 24, 2006, CBC normal.  Glucose 109.  A1c 5.5%.  Cholesterol 130, LDL 90.  TSH 2.1.  Urinalysis normal.   ASSESSMENT AND PLAN:  1. Hypertension.  He has been out of prescription for one week.  Will      restart.  2. Skin lesions on the nose.  Dermatology consultation with Dr. Donzetta Starch.  3. Side effects with Flomax (dry ejaculation).  Will discontinue.  4. Benign prostatic hypertrophy symptoms.  Will try Avodart 1 daily.  I      will see him back in 3 months.  5. Dyslipidemia.  Continue with fish oil.  6. Wart on the right thumb.  Will treat with cryosurgery.  7. Guaiac positive stool.  GI consult with Dr. Marina Goodell.                                  Georgina Quint. Plotnikov, MD   AVP/MedQ  DD:  06/26/2006  DT:   06/27/2006  Job #:  [id number]

## 2011-03-07 ENCOUNTER — Other Ambulatory Visit: Payer: Self-pay | Admitting: Internal Medicine

## 2011-05-18 ENCOUNTER — Other Ambulatory Visit: Payer: Self-pay

## 2011-05-24 ENCOUNTER — Other Ambulatory Visit (INDEPENDENT_AMBULATORY_CARE_PROVIDER_SITE_OTHER): Payer: Self-pay

## 2011-05-24 DIAGNOSIS — R7309 Other abnormal glucose: Secondary | ICD-10-CM

## 2011-05-24 LAB — BASIC METABOLIC PANEL
BUN: 17 mg/dL (ref 6–23)
CO2: 29 mEq/L (ref 19–32)
Calcium: 9.1 mg/dL (ref 8.4–10.5)
Chloride: 107 mEq/L (ref 96–112)
Creatinine, Ser: 0.9 mg/dL (ref 0.4–1.5)
GFR: 93.7 mL/min (ref >60.00)
Glucose, Bld: 132 mg/dL — ABNORMAL HIGH (ref 70–99)
Potassium: 3.9 mEq/L (ref 3.5–5.1)
Sodium: 142 mEq/L (ref 135–145)

## 2011-05-24 LAB — PSA: PSA: 2.31 ng/mL (ref 0.10–4.00)

## 2011-05-25 ENCOUNTER — Encounter: Payer: Self-pay | Admitting: Internal Medicine

## 2011-05-25 ENCOUNTER — Ambulatory Visit (INDEPENDENT_AMBULATORY_CARE_PROVIDER_SITE_OTHER): Payer: PRIVATE HEALTH INSURANCE | Admitting: Internal Medicine

## 2011-05-25 DIAGNOSIS — R7309 Other abnormal glucose: Secondary | ICD-10-CM

## 2011-05-25 DIAGNOSIS — E785 Hyperlipidemia, unspecified: Secondary | ICD-10-CM

## 2011-05-25 DIAGNOSIS — M545 Low back pain, unspecified: Secondary | ICD-10-CM

## 2011-05-25 DIAGNOSIS — E119 Type 2 diabetes mellitus without complications: Secondary | ICD-10-CM

## 2011-05-25 DIAGNOSIS — B351 Tinea unguium: Secondary | ICD-10-CM

## 2011-05-25 MED ORDER — GLUCOSE BLOOD VI STRP: ORAL_STRIP | Status: DC

## 2011-05-25 MED ORDER — CICLOPIROX 8 % EX SOLN: Freq: Every day | CUTANEOUS | Status: DC

## 2011-05-25 MED ORDER — METFORMIN HCL 500 MG PO TABS: 1000.0000 mg | ORAL_TABLET | Freq: Two times a day (BID) | ORAL | Status: DC

## 2011-05-25 NOTE — Assessment & Plan Note (Signed)
[place]

## 2011-05-25 NOTE — Assessment & Plan Note (Signed)
On rx 

## 2011-05-25 NOTE — Assessment & Plan Note (Signed)
On Rx prn 

## 2011-05-25 NOTE — Progress Notes (Signed)
  Subjective:    Patient ID: [name] [name], male    DOB: Nov 26, 1955, 55 y.o.   MRN: [id number]  HPI   The patient presents for a follow-up of  chronic hypertension, chronic dyslipidemia, ED, type 2 diabetes controlled with medicines Wt Readings from Last 3 Encounters:  05/25/11 208 lb (94.348 kg)  10/02/10 210 lb (95.255 kg)  05/23/10 205 lb (92.987 kg)     Review of Systems  Constitutional: Negative for appetite change, fatigue and unexpected weight change.  HENT: Negative for nosebleeds, congestion, sore throat, sneezing, trouble swallowing and neck pain.   Eyes: Negative for itching and visual disturbance.  Respiratory: Negative for cough.   Cardiovascular: Negative for chest pain, palpitations and leg swelling.  Gastrointestinal: Negative for nausea, diarrhea, blood in stool and abdominal distention.  Genitourinary: Negative for frequency and hematuria.  Musculoskeletal: Negative for back pain, joint swelling and gait problem.  Skin: Negative for rash.  Neurological: Negative for dizziness, tremors, speech difficulty and weakness.  Psychiatric/Behavioral: Negative for sleep disturbance, dysphoric mood and agitation. The patient is not nervous/anxious.        Objective:   Physical Exam  Constitutional: He is oriented to person, place, and time. He appears well-developed.  HENT:  Mouth/Throat: Oropharynx is clear and moist.  Eyes: Conjunctivae are normal. Pupils are equal, round, and reactive to light.  Neck: Normal range of motion. No JVD present. No thyromegaly present.  Cardiovascular: Normal rate, regular rhythm, normal heart sounds and intact distal pulses.  Exam reveals no gallop and no friction rub.   No murmur heard. Pulmonary/Chest: Effort normal and breath sounds normal. No respiratory distress. He has no wheezes. He has no rales. He exhibits no tenderness.  Abdominal: Soft. Bowel sounds are normal. He exhibits no distension and no mass. There is no tenderness.  There is no rebound and no guarding.  Musculoskeletal: Normal range of motion. He exhibits no edema and no tenderness.  Lymphadenopathy:    He has no cervical adenopathy.  Neurological: He is alert and oriented to person, place, and time. He has normal reflexes. No cranial nerve deficit. He exhibits normal muscle tone. Coordination normal.  Skin: Skin is warm and dry. No rash noted.  Psychiatric: He has a normal mood and affect. His behavior is normal. Judgment and thought content normal.  Onycho B  Lab Results  Component Value Date   WBC 8.0 11/05/2008   HGB 17.0 11/05/2008   HCT 49.9 11/05/2008   PLT 162 11/05/2008   CHOL 129 05/22/2010   TRIG 139.0 05/22/2010   HDL 26.10* 05/22/2010   ALT 29 05/22/2010   AST 18 05/22/2010   NA 142 05/24/2011   K 3.9 05/24/2011   CL 107 05/24/2011   CREATININE 0.9 05/24/2011   BUN 17 05/24/2011   CO2 29 05/24/2011   TSH 0.89 11/05/2008   PSA 2.31 05/24/2011   HGBA1C 6.5 09/29/2010         Assessment & Plan:

## 2011-05-25 NOTE — Assessment & Plan Note (Signed)
On Rx 

## 2011-05-25 NOTE — Assessment & Plan Note (Signed)
  On diet  

## 2011-05-28 ENCOUNTER — Other Ambulatory Visit: Payer: Self-pay | Admitting: Internal Medicine

## 2011-09-11 ENCOUNTER — Other Ambulatory Visit: Payer: Self-pay | Admitting: *Deleted

## 2011-09-11 MED ORDER — GLUCOSE BLOOD VI STRP: ORAL_STRIP | Status: DC

## 2011-09-11 NOTE — Progress Notes (Signed)
Pt needed new Rx for test strips stating testing more than twice a day for Insurance to cover cost. Ok per Dr. Posey Rea LMOM to inform Pt.

## 2011-09-24 ENCOUNTER — Other Ambulatory Visit (INDEPENDENT_AMBULATORY_CARE_PROVIDER_SITE_OTHER): Payer: PRIVATE HEALTH INSURANCE

## 2011-09-24 DIAGNOSIS — E119 Type 2 diabetes mellitus without complications: Secondary | ICD-10-CM

## 2011-09-24 LAB — COMPREHENSIVE METABOLIC PANEL
ALT: 31 U/L (ref 0–53)
AST: 20 U/L (ref 0–37)
Albumin: 3.9 g/dL (ref 3.5–5.2)
Alkaline Phosphatase: 86 U/L (ref 39–117)
BUN: 20 mg/dL (ref 6–23)
CO2: 26 mEq/L (ref 19–32)
Calcium: 8.9 mg/dL (ref 8.4–10.5)
Chloride: 108 mEq/L (ref 96–112)
Creatinine, Ser: 0.9 mg/dL (ref 0.4–1.5)
GFR: 94.82 mL/min (ref >60.00)
Glucose, Bld: 102 mg/dL — ABNORMAL HIGH (ref 70–99)
Potassium: 3.6 mEq/L (ref 3.5–5.1)
Sodium: 141 mEq/L (ref 135–145)
Total Bilirubin: 0.6 mg/dL (ref 0.3–1.2)
Total Protein: 6.6 g/dL (ref 6.0–8.3)

## 2011-09-24 LAB — HEMOGLOBIN A1C: Hgb A1c MFr Bld: 6.3 % (ref 4.6–6.5)

## 2011-09-25 ENCOUNTER — Encounter: Payer: Self-pay | Admitting: Internal Medicine

## 2011-09-25 ENCOUNTER — Ambulatory Visit (INDEPENDENT_AMBULATORY_CARE_PROVIDER_SITE_OTHER): Payer: PRIVATE HEALTH INSURANCE | Admitting: Internal Medicine

## 2011-09-25 DIAGNOSIS — M545 Low back pain, unspecified: Secondary | ICD-10-CM

## 2011-09-25 DIAGNOSIS — I10 Essential (primary) hypertension: Secondary | ICD-10-CM

## 2011-09-25 DIAGNOSIS — E119 Type 2 diabetes mellitus without complications: Secondary | ICD-10-CM

## 2011-09-25 MED ORDER — AZITHROMYCIN 250 MG PO TABS: ORAL_TABLET | ORAL | Status: AC

## 2011-09-25 NOTE — Progress Notes (Signed)
  Subjective:    Patient ID: [name] [name], male    DOB: May 08, 1956, 55 y.o.   MRN: [id number]  HPI  The patient presents for a follow-up of  chronic hypertension, chronic dyslipidemia, type 2 diabetes controlled with medicines  C/o sinus congestion - yellow d/c  Review of Systems  Constitutional: Negative for appetite change, fatigue and unexpected weight change.  HENT: Negative for nosebleeds, congestion, sore throat, sneezing, trouble swallowing and neck pain.   Eyes: Negative for itching and visual disturbance.  Respiratory: Negative for cough.   Cardiovascular: Negative for chest pain, palpitations and leg swelling.  Gastrointestinal: Negative for nausea, diarrhea, blood in stool and abdominal distention.  Genitourinary: Negative for frequency and hematuria.  Musculoskeletal: Negative for back pain, joint swelling and gait problem.  Skin: Negative for rash.  Neurological: Negative for dizziness, tremors, speech difficulty and weakness.  Psychiatric/Behavioral: Negative for sleep disturbance, dysphoric mood and agitation. The patient is not nervous/anxious.    Wt Readings from Last 3 Encounters:  09/25/11 206 lb (93.441 kg)  05/25/11 208 lb (94.348 kg)  10/02/10 210 lb (95.255 kg)       Objective:   Physical Exam  Constitutional: He is oriented to person, place, and time. He appears well-developed.  HENT:  Mouth/Throat: Oropharynx is clear and moist.  Eyes: Conjunctivae are normal. Pupils are equal, round, and reactive to light.  Neck: Normal range of motion. No JVD present. No thyromegaly present.  Cardiovascular: Normal rate, regular rhythm, normal heart sounds and intact distal pulses.  Exam reveals no gallop and no friction rub.   No murmur heard. Pulmonary/Chest: Effort normal and breath sounds normal. No respiratory distress. He has no wheezes. He has no rales. He exhibits no tenderness.  Abdominal: Soft. Bowel sounds are normal. He exhibits no distension and no  mass. There is no tenderness. There is no rebound and no guarding.  Musculoskeletal: Normal range of motion. He exhibits no edema and no tenderness.  Lymphadenopathy:    He has no cervical adenopathy.  Neurological: He is alert and oriented to person, place, and time. He has normal reflexes. No cranial nerve deficit. He exhibits normal muscle tone. Coordination normal.  Skin: Skin is warm and dry. No rash noted.  Psychiatric: He has a normal mood and affect. His behavior is normal. Judgment and thought content normal.          Assessment & Plan:

## 2011-09-25 NOTE — Assessment & Plan Note (Signed)
Continue with current prescription therapy as reflected on the Med list.  

## 2011-09-26 ENCOUNTER — Encounter: Payer: Self-pay | Admitting: Internal Medicine

## 2012-01-08 ENCOUNTER — Ambulatory Visit (INDEPENDENT_AMBULATORY_CARE_PROVIDER_SITE_OTHER): Payer: PRIVATE HEALTH INSURANCE | Admitting: Internal Medicine

## 2012-01-08 ENCOUNTER — Telehealth: Payer: Self-pay | Admitting: Internal Medicine

## 2012-01-08 ENCOUNTER — Encounter: Payer: Self-pay | Admitting: Internal Medicine

## 2012-01-08 ENCOUNTER — Other Ambulatory Visit (INDEPENDENT_AMBULATORY_CARE_PROVIDER_SITE_OTHER): Payer: PRIVATE HEALTH INSURANCE

## 2012-01-08 VITALS — BP 148/90 | HR 96 | Temp 98.1°F | Resp 16 | Wt 210.0 lb

## 2012-01-08 DIAGNOSIS — E119 Type 2 diabetes mellitus without complications: Secondary | ICD-10-CM

## 2012-01-08 DIAGNOSIS — R82998 Other abnormal findings in urine: Secondary | ICD-10-CM

## 2012-01-08 DIAGNOSIS — I10 Essential (primary) hypertension: Secondary | ICD-10-CM

## 2012-01-08 DIAGNOSIS — H6690 Otitis media, unspecified, unspecified ear: Secondary | ICD-10-CM

## 2012-01-08 DIAGNOSIS — H543 Unqualified visual loss, both eyes: Secondary | ICD-10-CM

## 2012-01-08 LAB — BASIC METABOLIC PANEL
BUN: 15 mg/dL (ref 6–23)
CO2: 30 mEq/L (ref 19–32)
Calcium: 8.8 mg/dL (ref 8.4–10.5)
Chloride: 102 mEq/L (ref 96–112)
Creatinine, Ser: 0.7 mg/dL (ref 0.4–1.5)
GFR: 132.01 mL/min (ref >60.00)
Glucose, Bld: 119 mg/dL — ABNORMAL HIGH (ref 70–99)
Potassium: 3.9 mEq/L (ref 3.5–5.1)
Sodium: 138 mEq/L (ref 135–145)

## 2012-01-08 LAB — CBC WITH DIFFERENTIAL/PLATELET
Basophils Absolute: 0.1 10*3/uL (ref 0.0–0.1)
Basophils Relative: 1.5 % (ref 0.0–3.0)
Eosinophils Absolute: 0.3 10*3/uL (ref 0.0–0.7)
Eosinophils Relative: 3.4 % (ref 0.0–5.0)
HCT: 49.1 % (ref 39.0–52.0)
Hemoglobin: 16.5 g/dL (ref 13.0–17.0)
Lymphocytes Relative: 23.7 % (ref 12.0–46.0)
Lymphs Abs: 1.9 10*3/uL (ref 0.7–4.0)
MCHC: 33.5 g/dL (ref 30.0–36.0)
MCV: 88.5 fl (ref 78.0–100.0)
Monocytes Absolute: 0.8 10*3/uL (ref 0.1–1.0)
Monocytes Relative: 9.8 % (ref 3.0–12.0)
Neutro Abs: 4.9 10*3/uL (ref 1.4–7.7)
Neutrophils Relative %: 61.6 % (ref 43.0–77.0)
Platelets: 166 10*3/uL (ref 150.0–400.0)
RBC: 5.55 Mil/uL (ref 4.22–5.81)
RDW: 14.5 % (ref 11.5–14.6)
WBC: 7.9 10*3/uL (ref 4.5–10.5)

## 2012-01-08 LAB — URINALYSIS, ROUTINE W REFLEX MICROSCOPIC
Bilirubin Urine: NEGATIVE
Ketones, ur: NEGATIVE
Leukocytes, UA: NEGATIVE
Nitrite: NEGATIVE
Specific Gravity, Urine: >=1.03 (ref 1.000–1.030)
Urine Glucose: NEGATIVE
Urobilinogen, UA: 0.2 (ref 0.0–1.0)
pH: 6 (ref 5.0–8.0)

## 2012-01-08 LAB — HEPATIC FUNCTION PANEL
ALT: 35 U/L (ref 0–53)
AST: 19 U/L (ref 0–37)
Albumin: 4 g/dL (ref 3.5–5.2)
Alkaline Phosphatase: 96 U/L (ref 39–117)
Bilirubin, Direct: 0.1 mg/dL (ref 0.0–0.3)
Total Bilirubin: 0.3 mg/dL (ref 0.3–1.2)
Total Protein: 7 g/dL (ref 6.0–8.3)

## 2012-01-08 LAB — HEMOGLOBIN A1C: Hgb A1c MFr Bld: 6.6 % — ABNORMAL HIGH (ref 4.6–6.5)

## 2012-01-08 LAB — PROTIME-INR
INR: 1.1 ratio — ABNORMAL HIGH (ref 0.8–1.0)
Prothrombin Time: 11.8 s (ref 10.2–12.4)

## 2012-01-08 LAB — SEDIMENTATION RATE: Sed Rate: 4 mm/hr (ref 0–22)

## 2012-01-08 MED ORDER — CEFUROXIME AXETIL 500 MG PO TABS: ORAL_TABLET | ORAL | Status: AC

## 2012-01-08 NOTE — Assessment & Plan Note (Signed)
opth appt

## 2012-01-08 NOTE — Telephone Encounter (Signed)
[name] [name], please, inform patient that all labs are ok  Please, mail the labs to the patient.    Thx  

## 2012-01-08 NOTE — Assessment & Plan Note (Signed)
Continue with current prescription therapy as reflected on the Med list.  

## 2012-01-08 NOTE — Progress Notes (Signed)
Patient ID: [name] [name], male   DOB: 12-Dec-1955, 56 y.o.   MRN: [id number]  Subjective:    Patient ID: [name] [name], male    DOB: May 14, 1956, 56 y.o.   MRN: [id number]  HPI  C/o dark urine x 1 d C/o trouble hearing The patient presents for a follow-up of  chronic hypertension, chronic dyslipidemia, type 2 diabetes controlled with medicines    Review of Systems  Constitutional: Negative for appetite change, fatigue and unexpected weight change.  HENT: Positive for hearing loss. Negative for ear pain, nosebleeds, congestion, sore throat, sneezing, trouble swallowing and neck pain.   Eyes: Negative for itching and visual disturbance.  Respiratory: Negative for cough.   Cardiovascular: Negative for chest pain, palpitations and leg swelling.  Gastrointestinal: Negative for nausea, diarrhea, blood in stool and abdominal distention.  Genitourinary: Negative for dysuria, urgency, frequency, hematuria and decreased urine volume.  Musculoskeletal: Negative for back pain, joint swelling and gait problem.  Skin: Negative for rash.  Neurological: Negative for dizziness, tremors, speech difficulty and weakness.  Psychiatric/Behavioral: Negative for sleep disturbance, dysphoric mood and agitation. The patient is not nervous/anxious.    Wt Readings from Last 3 Encounters:  01/08/12 210 lb (95.255 kg)  09/25/11 206 lb (93.441 kg)  05/25/11 208 lb (94.348 kg)   BP Readings from Last 3 Encounters:  01/08/12 148/90  09/25/11 130/90  05/25/11 148/82       Objective:   Physical Exam  Constitutional: He is oriented to person, place, and time. He appears well-developed.  HENT:  Mouth/Throat: Oropharynx is clear and moist.  Eyes: Conjunctivae are normal. Pupils are equal, round, and reactive to light.  Neck: Normal range of motion. No JVD present. No thyromegaly present.  Cardiovascular: Normal rate, regular rhythm, normal heart sounds and intact distal pulses.  Exam reveals no gallop and no  friction rub.   No murmur heard. Pulmonary/Chest: Effort normal and breath sounds normal. No respiratory distress. He has no wheezes. He has no rales. He exhibits no tenderness.  Abdominal: Soft. Bowel sounds are normal. He exhibits no distension and no mass. There is no tenderness. There is no rebound and no guarding.  Musculoskeletal: Normal range of motion. He exhibits no edema and no tenderness.  Lymphadenopathy:    He has no cervical adenopathy.  Neurological: He is alert and oriented to person, place, and time. He has normal reflexes. No cranial nerve deficit. He exhibits normal muscle tone. Coordination normal.  Skin: Skin is warm and dry. No rash noted.  Psychiatric: He has a normal mood and affect. His behavior is normal. Judgment and thought content normal.  No wax B B TMs are red  Lab Results  Component Value Date   WBC 8.0 11/05/2008   HGB 17.0 11/05/2008   HCT 49.9 11/05/2008   PLT 162 11/05/2008   GLUCOSE 102* 09/24/2011   CHOL 129 05/22/2010   TRIG 139.0 05/22/2010   HDL 26.10* 05/22/2010   LDLCALC 75 05/22/2010   ALT 31 09/24/2011   AST 20 09/24/2011   NA 141 09/24/2011   K 3.6 09/24/2011   CL 108 09/24/2011   CREATININE 0.9 09/24/2011   BUN 20 09/24/2011   CO2 26 09/24/2011   TSH 0.89 11/05/2008   PSA 2.31 05/24/2011   HGBA1C 6.3 09/24/2011         Assessment & Plan:

## 2012-01-08 NOTE — Assessment & Plan Note (Signed)
Get a UA

## 2012-01-08 NOTE — Assessment & Plan Note (Signed)
ceftin x 2 wks

## 2012-01-09 NOTE — Telephone Encounter (Signed)
Called pt- no answer/machine. Will mail labs to pt.

## 2012-01-10 ENCOUNTER — Telehealth: Payer: Self-pay

## 2012-01-10 NOTE — Telephone Encounter (Signed)
Pt informed of labs but is still concerned about dark urine.  Pt is requesting referral to Urology, please advise.

## 2012-01-15 NOTE — Telephone Encounter (Signed)
Will schedule. Hematuria (macro) Thx

## 2012-01-25 ENCOUNTER — Ambulatory Visit (INDEPENDENT_AMBULATORY_CARE_PROVIDER_SITE_OTHER): Payer: PRIVATE HEALTH INSURANCE | Admitting: Internal Medicine

## 2012-01-25 ENCOUNTER — Encounter: Payer: Self-pay | Admitting: Internal Medicine

## 2012-01-25 VITALS — BP 162/100 | HR 84 | Temp 98.4°F | Resp 16 | Wt 206.0 lb

## 2012-01-25 DIAGNOSIS — E119 Type 2 diabetes mellitus without complications: Secondary | ICD-10-CM

## 2012-01-25 DIAGNOSIS — M545 Low back pain, unspecified: Secondary | ICD-10-CM

## 2012-01-25 DIAGNOSIS — E785 Hyperlipidemia, unspecified: Secondary | ICD-10-CM

## 2012-01-25 DIAGNOSIS — R82998 Other abnormal findings in urine: Secondary | ICD-10-CM

## 2012-01-25 DIAGNOSIS — R21 Rash and other nonspecific skin eruption: Secondary | ICD-10-CM

## 2012-01-25 MED ORDER — CLOBETASOL PROPIONATE 0.05 % EX SOLN: 1.0000 "application " | Freq: Two times a day (BID) | CUTANEOUS | Status: AC

## 2012-01-25 NOTE — Assessment & Plan Note (Signed)
Continue with current prescription therapy as reflected on the Med list.  

## 2012-01-25 NOTE — Progress Notes (Signed)
Patient ID: [name] [name], male   DOB: 1956/02/12, 56 y.o.   MRN: [id number] Patient ID: [name] [name], male   DOB: 1955/12/12, 56 y.o.   MRN: [id number]  Subjective:    Patient ID: [name] [name], male    DOB: 1956/02/08, 56 y.o.   MRN: [id number]  HPI  C/o dark urine off and on  C/o trouble hearing The patient presents for a follow-up of  chronic hypertension, chronic dyslipidemia, type 2 diabetes controlled with medicines    Review of Systems  Constitutional: Negative for appetite change, fatigue and unexpected weight change.  HENT: Positive for hearing loss. Negative for ear pain, nosebleeds, congestion, sore throat, sneezing, trouble swallowing and neck pain.   Eyes: Negative for itching and visual disturbance.  Respiratory: Negative for cough.   Cardiovascular: Negative for chest pain, palpitations and leg swelling.  Gastrointestinal: Negative for nausea, diarrhea, blood in stool and abdominal distention.  Genitourinary: Negative for dysuria, urgency, frequency, hematuria and decreased urine volume.  Musculoskeletal: Negative for back pain, joint swelling and gait problem.  Skin: Negative for rash.  Neurological: Negative for dizziness, tremors, speech difficulty and weakness.  Psychiatric/Behavioral: Negative for sleep disturbance, dysphoric mood and agitation. The patient is not nervous/anxious.    Wt Readings from Last 3 Encounters:  01/25/12 206 lb (93.441 kg)  01/08/12 210 lb (95.255 kg)  09/25/11 206 lb (93.441 kg)   BP Readings from Last 3 Encounters:  01/25/12 162/100  01/08/12 148/90  09/25/11 130/90       Objective:   Physical Exam  Constitutional: He is oriented to person, place, and time. He appears well-developed.  HENT:  Mouth/Throat: Oropharynx is clear and moist.  Eyes: Conjunctivae are normal. Pupils are equal, round, and reactive to light.  Neck: Normal range of motion. No JVD present. No thyromegaly present.  Cardiovascular: Normal rate, regular  rhythm, normal heart sounds and intact distal pulses.  Exam reveals no gallop and no friction rub.   No murmur heard. Pulmonary/Chest: Effort normal and breath sounds normal. No respiratory distress. He has no wheezes. He has no rales. He exhibits no tenderness.  Abdominal: Soft. Bowel sounds are normal. He exhibits no distension and no mass. There is no tenderness. There is no rebound and no guarding.  Musculoskeletal: Normal range of motion. He exhibits no edema and no tenderness.  Lymphadenopathy:    He has no cervical adenopathy.  Neurological: He is alert and oriented to person, place, and time. He has normal reflexes. No cranial nerve deficit. He exhibits normal muscle tone. Coordination normal.  Skin: Skin is warm and dry. No rash noted.  Psychiatric: He has a normal mood and affect. His behavior is normal. Judgment and thought content normal.  No wax B B TMs are red  Lab Results  Component Value Date   WBC 7.9 01/08/2012   HGB 16.5 01/08/2012   HCT 49.1 01/08/2012   PLT 166.0 01/08/2012   GLUCOSE 119* 01/08/2012   CHOL 129 05/22/2010   TRIG 139.0 05/22/2010   HDL 26.10* 05/22/2010   LDLCALC 75 05/22/2010   ALT 35 01/08/2012   AST 19 01/08/2012   NA 138 01/08/2012   K 3.9 01/08/2012   CL 102 01/08/2012   CREATININE 0.7 01/08/2012   BUN 15 01/08/2012   CO2 30 01/08/2012   TSH 0.89 11/05/2008   PSA 2.31 05/24/2011   INR 1.1* 01/08/2012   HGBA1C 6.6* 01/08/2012         Assessment & Plan:

## 2012-01-27 ENCOUNTER — Encounter: Payer: Self-pay | Admitting: Internal Medicine

## 2012-02-20 ENCOUNTER — Other Ambulatory Visit: Payer: Self-pay | Admitting: Urology

## 2012-02-27 ENCOUNTER — Encounter (HOSPITAL_COMMUNITY): Payer: Self-pay | Admitting: Pharmacy Technician

## 2012-02-28 ENCOUNTER — Encounter (HOSPITAL_COMMUNITY): Payer: Self-pay | Admitting: *Deleted

## 2012-02-28 NOTE — Progress Notes (Signed)
To arrive 0530 on 03/03/12 with blue folder, insurance card, and responsible driver. No metformin AM of procedure. Reviewed need for laxative day before procedure. NPO after midnight. Pt verbalizes understanding.

## 2012-03-03 ENCOUNTER — Encounter (HOSPITAL_COMMUNITY)
Admission: RE | Disposition: A | Discharge status: Home / Self Care | Payer: Self-pay | Source: Ambulatory Visit | Attending: Urology

## 2012-03-03 ENCOUNTER — Ambulatory Visit (HOSPITAL_COMMUNITY): Payer: PRIVATE HEALTH INSURANCE

## 2012-03-03 ENCOUNTER — Ambulatory Visit (HOSPITAL_COMMUNITY)
Admission: RE | Admit: 2012-03-03 | Discharge: 2012-03-03 | Disposition: A | Discharge status: Home / Self Care | Payer: PRIVATE HEALTH INSURANCE | Source: Ambulatory Visit | Attending: Urology | Admitting: Urology

## 2012-03-03 ENCOUNTER — Encounter (HOSPITAL_COMMUNITY): Payer: Self-pay | Admitting: *Deleted

## 2012-03-03 DIAGNOSIS — N200 Calculus of kidney: Secondary | ICD-10-CM | POA: Insufficient documentation

## 2012-03-03 DIAGNOSIS — E119 Type 2 diabetes mellitus without complications: Secondary | ICD-10-CM | POA: Insufficient documentation

## 2012-03-03 DIAGNOSIS — I10 Essential (primary) hypertension: Secondary | ICD-10-CM | POA: Insufficient documentation

## 2012-03-03 LAB — GLUCOSE, CAPILLARY: Glucose-Capillary: 194 mg/dL — ABNORMAL HIGH (ref 70–99)

## 2012-03-03 SURGERY — LITHOTRIPSY, ESWL
Anesthesia: LOCAL | Laterality: Left

## 2012-03-03 MED ORDER — OXYCODONE-ACETAMINOPHEN 5-325 MG PO TABS: 1.0000 | ORAL_TABLET | ORAL | Status: AC | PRN

## 2012-03-03 MED ORDER — DIAZEPAM 5 MG PO TABS
10.0000 mg | ORAL_TABLET | ORAL | Status: AC
  Administered 2012-03-03: 10 mg via ORAL

## 2012-03-03 MED ORDER — DIPHENHYDRAMINE HCL 25 MG PO CAPS
25.0000 mg | ORAL_CAPSULE | ORAL | Status: AC
  Administered 2012-03-03: 25 mg via ORAL

## 2012-03-03 MED ORDER — CIPROFLOXACIN HCL 500 MG PO TABS
ORAL_TABLET | ORAL | Status: AC
  Filled 2012-03-03: qty 1

## 2012-03-03 MED ORDER — DIAZEPAM 5 MG PO TABS
ORAL_TABLET | ORAL | Status: AC
  Filled 2012-03-03: qty 2

## 2012-03-03 MED ORDER — DEXTROSE-NACL 5-0.45 % IV SOLN
INTRAVENOUS | Status: DC
  Administered 2012-03-03: 1000 mL via INTRAVENOUS

## 2012-03-03 MED ORDER — CIPROFLOXACIN HCL 500 MG PO TABS
500.0000 mg | ORAL_TABLET | ORAL | Status: AC
  Administered 2012-03-03: 500 mg via ORAL

## 2012-03-03 MED ORDER — DIPHENHYDRAMINE HCL 25 MG PO CAPS
ORAL_CAPSULE | ORAL | Status: AC
  Filled 2012-03-03: qty 1

## 2012-03-03 NOTE — Discharge Instructions (Addendum)
Lithotripsy Care After Refer to this sheet for the next few weeks. These discharge instructions provide you with general information on caring for yourself after you leave the hospital. Your caregiver may also give you specific instructions. Your treatment has been planned according to the most current medical practices available, but unavoidable complications sometimes occur. If you have any problems or questions after discharge, please call your caregiver. AFTER THE PROCEDURE   The recovery time will vary with the procedure done.   You will be taken to the recovery area. A nurse will watch and check your progress. Once you are awake, stable, and taking fluids well, you will be allowed to go home as long as there are no problems.   Your urine may have a red tinge for a few days after treatment. Blood loss is usually minimal.   You may have soreness in the back or flank area. This usually goes away after a few days. The procedure can cause blotches or bruises on the back where the pressure wave enters the skin. These marks usually cause only minimal discomfort and should disappear in a short time.   Stone fragments should begin to pass within 24 hours of treatment. However, a delayed passage is not unusual.   You may have pain, discomfort, and feel sick to your stomach (nauseous) when the crushed (pulverized) fragments of stone are passed down the tube from the kidney to the bladder. Stone fragments can pass soon after the procedure and may last for up to 4 to 8 weeks.   A small number of patients may have severe pain when stone fragments are not able to pass, which leads to an obstruction.   If your stone is greater than 1 inch/2.5 centimeters in diameter or if you have multiple stones that have a combined diameter greater than 1 inch/2.5 centimeters, you may require more than 1 treatment.   You must have someone drive you home.  HOME CARE INSTRUCTIONS   Rest at home until you feel your  energy improving.   Only take over-the-counter or prescription medicines for pain, discomfort, or fever as directed by your caregiver. Depending on the type of lithotripsy, you may need to take medicines (antibiotics) that kill germs and anti-inflammatory medicines for a few days.   Drink enough water and fluids to keep your urine clear or pale yellow. This helps "flush" your kidneys. It helps pass any remaining pieces of stone and prevents stones from coming back.   Most people can resume daily activities within 1 or 2 days after standard lithotripsy. It can take longer to recover from laser and percutaneous lithotripsy.   If the stones are in your urinary system, you may be asked to strain your urine at home to look for stones. Any stones that are found can be sent to a medical lab for examination.   Visit your caregiver for a follow-up appointment in a few weeks. Your doctor may remove your stent if you have one. Your caregiver will also check to see whether stone particles still remain.  SEEK MEDICAL CARE IF:   You have an oral temperature above 102 F (38.9 C).   Your pain is not relieved by medicine.   You have a lasting nauseous feeling.   You feel there is too much blood in the urine.   You develop persistent problems with frequent and/or painful urination that does not at least partially improve after 2 days following the procedure.   You have a congested   cough.   You feel lightheaded.   You develop a rash or any other signs that might suggest an allergic problem.   You develop any reaction or side effects to your medicine(s).  SEEK IMMEDIATE MEDICAL CARE IF:   You experience severe back and/or flank pain.   You see nothing but blood when you urinate.   You cannot pass any urine at all.   You have an oral temperature above 102 F (38.9 C), not controlled by medicine.   You develop shortness of breath, difficulty breathing, or chest pain.   You develop vomiting  that will not stop after 6 to 8 hours.   You have a fainting episode.  MAKE SURE YOU:   Understand these instructions.   Will watch your condition.   Will get help right away if you are not doing well or get worse.  Document Released: 10/21/2007 Document Revised: 09/20/2011 Document Reviewed: 10/21/2007 [facility] Patient Information 2012 [place], [place].  Hold ASA and ibuprofen for 24 hours

## 2012-03-03 NOTE — H&P (Signed)
Problems  1. Benign Prostatic Hypertrophy With Urinary Obstruction 600.01 2. Gross Hematuria 599.71 3. Nephrolithiasis 592.0 4. Pilonidal Cyst With Sinus 685.1 5. Renal Cyst 593.2  History of Present Illness  [name] [name] is a former patient sent back in consultation by Dr. Posey Rea for gross hematuria.  He has had dark urine intermittantly for the last month that is worse with heavy activity.  He has no clots.  He has had no pain or voiding symptoms.  He had a UA that showed >50 RBC's.  I saw him 2008 with an elevated PSA and prostatitis but that resolved.  He has had no history of stones.   Past Medical History Problems  1. History of  Bone Spur 726.91 2. History of  Cervical Disc Degeneration 722.4 3. History of  Diabetes Mellitus 250.00 4. History of  Feelings Of Urinary Urgency 788.63 5. History of  Glaucoma 365.9 6. History of  Hypertension 401.9 7. History of  Prostatitis 601.9 8. History of  PSA,Elevated 790.93 9. History of  Pyuria 791.9  Current Meds 1. Benicar HCT TABS; Therapy: (Recorded:25Sep2008) to 2. MetFORMIN HCl 500 MG Oral Tablet; Therapy: 04Dec2012 to  Allergies Medication  1. Codeine Derivatives  Family History Problems  1. Maternal history of  Cancer 2. Family history of  Family Health Status Number Of Children  Social History Problems    Caffeine Use   Current Smoker 305.1   Marital History - Currently Married   Occupation:   Tobacco Use V15.82   Using Marijuana Denied    Alcohol Use   He smokes cigarettes but more MJ than cigarettes.   Review of Systems Genitourinary, constitutional, skin, eye, otolaryngeal, hematologic/lymphatic, cardiovascular, pulmonary, endocrine, musculoskeletal, gastrointestinal, neurological and psychiatric system(s) were reviewed and pertinent findings if present are noted.  Genitourinary: hematuria.    Vitals Vital Signs [Data Includes: Last 1 Day]  06May2013 03:19PM  BMI Calculated: 32.95 BSA Calculated:  2.02 Height: 5 ft 6 in Weight: 205 lb  Blood Pressure: 152 / 97 Temperature: 97.5 F Heart Rate: 71  Physical Exam Constitutional: Well nourished and well developed . No acute distress.  ENT:. The ears and nose are normal in appearance.  Neck: The appearance of the neck is normal and no neck mass is present.  Pulmonary: No respiratory distress and normal respiratory rhythm and effort.  Cardiovascular: Heart rate and rhythm are normal . No peripheral edema.  Abdomen: The abdomen is rounded. The abdomen is soft and nontender. No masses are palpated. No CVA tenderness. No hernias are palpable. No hepatosplenomegaly noted.  Rectal: Rectal exam demonstrates normal sphincter tone, no tenderness and no masses. Estimated prostate size is 1+. The prostate has no nodularity and is not tender. The left seminal vesicle is nonpalpable. The right seminal vesicle is nonpalpable. The perineum is normal on inspection.  Genitourinary: Examination of the penis demonstrates no discharge, no masses, no lesions and a normal meatus. The scrotum is without lesions. The right epididymis is palpably normal and non-tender. The left epididymis is palpably normal and non-tender. The right testis is non-tender and without masses. The left testis is non-tender and without masses.  Lymphatics: The supraclavicular, axillary, femoral and inguinal nodes are not enlarged or tender.  Skin: Normal skin turgor, no visible rash and no visible skin lesions.  Neuro/Psych:. Mood and affect are appropriate.    Results/Data  Urine [Data Includes: Last 1 Day]   06May2013  COLOR YELLOW   APPEARANCE CLEAR   SPECIFIC GRAVITY 1.025   pH  6.0   GLUCOSE NEG mg/dL  BILIRUBIN NEG   KETONE NEG mg/dL  BLOOD LARGE   PROTEIN NEG mg/dL  UROBILINOGEN 0.2 mg/dL  NITRITE NEG   LEUKOCYTE ESTERASE NEG   SQUAMOUS EPITHELIAL/HPF RARE   WBC NONE SEEN WBC/hpf  RBC 21-50 RBC/hpf  BACTERIA RARE   CRYSTALS NONE SEEN   CASTS NONE SEEN   Other  AMORPHOUS    Old records or history reviewed: I have reviewed our prior records and recent records and labs from Dr. Posey Rea.  The following images/tracing/specimen were independently visualized:  CT hematuria study today shows bilateral renal stones with a 12mm LUPJ stone that is very irregular. There is a possibility that this could be dystrophic calcification on a mucosal lesion but the delayed viewed on the bone window are more consistent with a free floating stone. There are small bilateral calyceal stone. He has bilateral renal cysts right > left and he has a large prostate with some calciifications. A full report is pending.  The following clinical lab reports were reviewed:  Cr 0.7 on 3/26.  18 Feb 2012 3:10 PM   UA With REFLEX       COLOR YELLOW       APPEARANCE CLEAR       SPECIFIC GRAVITY 1.025       pH 6.0       GLUCOSE NEG       BILIRUBIN NEG       KETONE NEG       BLOOD LARGE       PROTEIN NEG       UROBILINOGEN 0.2       NITRITE NEG       LEUKOCYTE ESTERASE NEG       SQUAMOUS EPITHELIAL/HPF RARE       WBC NONE SEEN       CRYSTALS NONE SEEN       CASTS NONE SEEN       RBC 21-50       BACTERIA RARE       Other AMORPHOUS      Assessment Assessed  1. Gross Hematuria 599.71 2. Nephrolithiasis 592.0 3. Renal Cyst 593.2 4. Benign Prostatic Hypertrophy With Urinary Obstruction 600.01   The probable cause of his bleeding is the LUPJ stone.  He has smaller bilateral stones and bilateral renal cysts along with an enlarged prostate.   Plan  Gross Hematuria (599.71)  1. AU CT-HEMATURIA PROTOCOL  Done: 06May2013 12:00AM 2. URINE CYTOLOGY  Done: 06May2013 Nephrolithiasis (592.0)  3. Follow-up Schedule Surgery Office  Follow-up  Done: 06May2013   I sent a urine cytology and will await the final CT report before deciding on therapy. If the cytology is not suspicious and the radiologist confirms a stone without a renal pelvic lesion, I will proceed with ESWL and reviewed  the risks of bleeding, infection, injury to the kidney or adjacent structures, failure of fragmentation, need for second procedures, thrombotic events or anesthetic complications.   If the radiologists have concern for a renal lesion or the cytology is suspicious, I will set him up for ureteroscopy and I reviewed those risks which also include ureteral injury, need for stenting and anesthetic risks in addition to the potential risks noted for ESWL. I will call him to arrange the procedure.

## 2012-05-26 ENCOUNTER — Other Ambulatory Visit: Payer: Self-pay | Admitting: Urology

## 2012-05-26 DIAGNOSIS — N281 Cyst of kidney, acquired: Secondary | ICD-10-CM

## 2012-05-27 ENCOUNTER — Ambulatory Visit (HOSPITAL_COMMUNITY)
Admission: RE | Admit: 2012-05-27 | Discharge: 2012-05-27 | Disposition: A | Discharge status: Home / Self Care | Payer: PRIVATE HEALTH INSURANCE | Source: Ambulatory Visit | Attending: Urology | Admitting: Urology

## 2012-05-27 DIAGNOSIS — N281 Cyst of kidney, acquired: Secondary | ICD-10-CM | POA: Insufficient documentation

## 2012-05-27 DIAGNOSIS — N289 Disorder of kidney and ureter, unspecified: Secondary | ICD-10-CM | POA: Insufficient documentation

## 2012-05-27 DIAGNOSIS — K7689 Other specified diseases of liver: Secondary | ICD-10-CM | POA: Insufficient documentation

## 2012-05-27 DIAGNOSIS — D3500 Benign neoplasm of unspecified adrenal gland: Secondary | ICD-10-CM | POA: Insufficient documentation

## 2012-05-27 MED ORDER — GADOBENATE DIMEGLUMINE 529 MG/ML IV SOLN
20.0000 mL | Freq: Once | INTRAVENOUS | Status: AC | PRN
  Administered 2012-05-27: 20 mL via INTRAVENOUS

## 2012-05-30 ENCOUNTER — Encounter: Payer: Self-pay | Admitting: Internal Medicine

## 2012-06-03 ENCOUNTER — Encounter: Payer: Self-pay | Admitting: Internal Medicine

## 2012-06-03 ENCOUNTER — Ambulatory Visit (INDEPENDENT_AMBULATORY_CARE_PROVIDER_SITE_OTHER): Payer: PRIVATE HEALTH INSURANCE | Admitting: Internal Medicine

## 2012-06-03 VITALS — BP 132/92 | HR 80 | Temp 98.6°F | Resp 16 | Wt 206.0 lb

## 2012-06-03 DIAGNOSIS — M545 Low back pain, unspecified: Secondary | ICD-10-CM

## 2012-06-03 DIAGNOSIS — I10 Essential (primary) hypertension: Secondary | ICD-10-CM

## 2012-06-03 DIAGNOSIS — E279 Disorder of adrenal gland, unspecified: Secondary | ICD-10-CM

## 2012-06-03 DIAGNOSIS — E278 Other specified disorders of adrenal gland: Secondary | ICD-10-CM

## 2012-06-03 DIAGNOSIS — B351 Tinea unguium: Secondary | ICD-10-CM

## 2012-06-03 DIAGNOSIS — N200 Calculus of kidney: Secondary | ICD-10-CM

## 2012-06-03 DIAGNOSIS — E119 Type 2 diabetes mellitus without complications: Secondary | ICD-10-CM

## 2012-06-03 NOTE — Assessment & Plan Note (Signed)
7/13 Dr Wilson Singer -- s/p lithotripsy

## 2012-06-03 NOTE — Assessment & Plan Note (Signed)
B feet Dr Ralene Cork

## 2012-06-03 NOTE — Assessment & Plan Note (Addendum)
7/13: MRI IMPRESSION:  1. Small (11 mm) exophytic enhancing lesion extending from the  mid right kidney. This represents a renal cell carcinoma until  proven otherwise.  2. 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  3. Benign hepatic cysts and adrenal adenomas.  Dr Annabell Howells this month

## 2012-06-03 NOTE — Assessment & Plan Note (Signed)
Continue with current prescription therapy as reflected on the Med list.  

## 2012-06-03 NOTE — Progress Notes (Signed)
   Subjective:    Patient ID: [name] [name], male    DOB: Jul 30, 1956, 56 y.o.   MRN: [id number]  HPI  F/u kidney stones -- s/p lithotripsy  The patient presents for a follow-up of  chronic hypertension, chronic dyslipidemia, type 2 diabetes controlled with medicines  He lost wt    Review of Systems  Constitutional: Negative for appetite change, fatigue and unexpected weight change.  HENT: Positive for hearing loss. Negative for ear pain, nosebleeds, congestion, sore throat, sneezing, trouble swallowing and neck pain.   Eyes: Negative for itching and visual disturbance.  Respiratory: Negative for cough.   Cardiovascular: Negative for chest pain, palpitations and leg swelling.  Gastrointestinal: Negative for nausea, diarrhea, blood in stool and abdominal distention.  Genitourinary: Negative for dysuria, urgency, frequency, hematuria and decreased urine volume.  Musculoskeletal: Negative for back pain, joint swelling and gait problem.  Skin: Negative for rash.  Neurological: Negative for dizziness, tremors, speech difficulty and weakness.  Psychiatric/Behavioral: Negative for disturbed wake/sleep cycle, dysphoric mood and agitation. The patient is not nervous/anxious.    Wt Readings from Last 3 Encounters:  06/03/12 206 lb (93.441 kg)  03/03/12 204 lb 4 oz (92.647 kg)  03/03/12 204 lb 4 oz (92.647 kg)   BP Readings from Last 3 Encounters:  06/03/12 132/92  03/03/12 166/104  03/03/12 166/104       Objective:   Physical Exam  Constitutional: He is oriented to person, place, and time. He appears well-developed.  HENT:  Mouth/Throat: Oropharynx is clear and moist.  Eyes: Conjunctivae are normal. Pupils are equal, round, and reactive to light.  Neck: Normal range of motion. No JVD present. No thyromegaly present.  Cardiovascular: Normal rate, regular rhythm, normal heart sounds and intact distal pulses.  Exam reveals no gallop and no friction rub.   No murmur  heard. Pulmonary/Chest: Effort normal and breath sounds normal. No respiratory distress. He has no wheezes. He has no rales. He exhibits no tenderness.  Abdominal: Soft. Bowel sounds are normal. He exhibits no distension and no mass. There is no tenderness. There is no rebound and no guarding.  Musculoskeletal: Normal range of motion. He exhibits no edema and no tenderness.  Lymphadenopathy:    He has no cervical adenopathy.  Neurological: He is alert and oriented to person, place, and time. He has normal reflexes. No cranial nerve deficit. He exhibits normal muscle tone. Coordination normal.  Skin: Skin is warm and dry. No rash noted.  Psychiatric: He has a normal mood and affect. His behavior is normal. Judgment and thought content normal.  No wax B   Lab Results  Component Value Date   WBC 7.9 01/08/2012   HGB 16.5 01/08/2012   HCT 49.1 01/08/2012   PLT 166.0 01/08/2012   GLUCOSE 119* 01/08/2012   CHOL 129 05/22/2010   TRIG 139.0 05/22/2010   HDL 26.10* 05/22/2010   LDLCALC 75 05/22/2010   ALT 35 01/08/2012   AST 19 01/08/2012   NA 138 01/08/2012   K 3.9 01/08/2012   CL 102 01/08/2012   CREATININE 0.7 01/08/2012   BUN 15 01/08/2012   CO2 30 01/08/2012   TSH 0.89 11/05/2008   PSA 2.31 05/24/2011   INR 1.1* 01/08/2012   HGBA1C 6.6* 01/08/2012         Assessment & Plan:

## 2012-06-17 ENCOUNTER — Ambulatory Visit (HOSPITAL_COMMUNITY)
Admission: RE | Admit: 2012-06-17 | Discharge: 2012-06-17 | Disposition: A | Discharge status: Home / Self Care | Payer: PRIVATE HEALTH INSURANCE | Source: Ambulatory Visit | Attending: Urology | Admitting: Urology

## 2012-06-17 ENCOUNTER — Other Ambulatory Visit (HOSPITAL_COMMUNITY): Payer: Self-pay | Admitting: Urology

## 2012-06-17 DIAGNOSIS — C649 Malignant neoplasm of unspecified kidney, except renal pelvis: Secondary | ICD-10-CM | POA: Insufficient documentation

## 2012-06-17 DIAGNOSIS — D4959 Neoplasm of unspecified behavior of other genitourinary organ: Secondary | ICD-10-CM

## 2012-06-24 ENCOUNTER — Encounter: Payer: Self-pay | Admitting: Internal Medicine

## 2012-06-26 ENCOUNTER — Other Ambulatory Visit: Payer: Self-pay | Admitting: Urology

## 2012-07-08 ENCOUNTER — Other Ambulatory Visit: Payer: Self-pay | Admitting: Internal Medicine

## 2012-07-21 ENCOUNTER — Encounter (HOSPITAL_COMMUNITY): Payer: Self-pay | Admitting: Pharmacy Technician

## 2012-07-23 ENCOUNTER — Other Ambulatory Visit: Payer: Self-pay

## 2012-07-23 ENCOUNTER — Encounter (HOSPITAL_COMMUNITY): Payer: Self-pay

## 2012-07-23 ENCOUNTER — Encounter (HOSPITAL_COMMUNITY)
Admission: RE | Admit: 2012-07-23 | Discharge: 2012-07-23 | Disposition: A | Discharge status: Home / Self Care | Payer: PRIVATE HEALTH INSURANCE | Source: Ambulatory Visit | Attending: Urology | Admitting: Urology

## 2012-07-23 HISTORY — DX: Personal history of urinary calculi: Z87.442

## 2012-07-23 HISTORY — DX: Neoplasm of unspecified behavior of unspecified kidney: D49.519

## 2012-07-23 HISTORY — DX: Other cervical disc displacement, unspecified cervical region: M50.20

## 2012-07-23 LAB — BASIC METABOLIC PANEL
BUN: 14 mg/dL (ref 6–23)
CO2: 28 mEq/L (ref 19–32)
Calcium: 9.3 mg/dL (ref 8.4–10.5)
Chloride: 105 mEq/L (ref 96–112)
Creatinine, Ser: 0.75 mg/dL (ref 0.50–1.35)
GFR calc Af Amer: >90 mL/min (ref >90)
GFR calc non Af Amer: >90 mL/min (ref >90)
Glucose, Bld: 103 mg/dL — ABNORMAL HIGH (ref 70–99)
Potassium: 3.5 mEq/L (ref 3.5–5.1)
Sodium: 141 mEq/L (ref 135–145)

## 2012-07-23 LAB — SURGICAL PCR SCREEN
MRSA, PCR: NEGATIVE
Staphylococcus aureus: NEGATIVE

## 2012-07-23 LAB — CBC
HCT: 47.5 % (ref 39.0–52.0)
Hemoglobin: 16.5 g/dL (ref 13.0–17.0)
MCH: 29.6 pg (ref 26.0–34.0)
MCHC: 34.7 g/dL (ref 30.0–36.0)
MCV: 85.1 fL (ref 78.0–100.0)
Platelets: 170 10*3/uL (ref 150–400)
RBC: 5.58 MIL/uL (ref 4.22–5.81)
RDW: 13.5 % (ref 11.5–15.5)
WBC: 7.6 10*3/uL (ref 4.0–10.5)

## 2012-07-23 LAB — ABO/RH: ABO/RH(D): O POS

## 2012-07-23 NOTE — Patient Instructions (Addendum)
Adiel Erney Wettstein  07/23/2012                           YOUR PROCEDURE IS SCHEDULED ON:  07/28/12 AT 7:15 AM               PLEASE REPORT TO SHORT STAY CENTER AT : 5:15 AM               CALL THIS NUMBER IF ANY PROBLEMS THE DAY OF SURGERY :               [phone]                      REMEMBER:   Do not eat food or drink liquids AFTER MIDNIGHT    Take these medicines the morning of surgery with A SIP OF WATER: NONE   Do not wear jewelry, make-up   Do not wear lotions, powders, or perfumes.   Do not shave legs or underarms 12 hrs. before surgery (men may shave face)  Do not bring valuables to the hospital.  Contacts, dentures or bridgework may not be worn into surgery.  Leave suitcase in the car. After surgery it may be brought to your room.  For patients admitted to the hospital more than one night, checkout time is 11:00                         AM the day of discharge.   Patients discharged the day of surgery will not be allowed to drive home                             If going home same day of surgery, must have someone stay with you first                           4 hrs at home and arrange for some one to drive you home from hospital.    Special Instructions:   Please read over the following fact sheets that you were given:               1. MRSA  INFORMATION                      2. [facility] PREPARING FOR SURGERY SHEET                3. INCENTIVE SPIROMETER                                                 X_____________________________________________________________________

## 2012-07-23 NOTE — Progress Notes (Signed)
07/23/12 1612  OBSTRUCTIVE SLEEP APNEA  Have you ever been diagnosed with sleep apnea through a sleep study? No  Do you snore loudly (loud enough to be heard through closed doors)?  1  Do you often feel tired, fatigued, or sleepy during the daytime? 0  Has anyone observed you stop breathing during your sleep? 0  Do you have, or are you being treated for high blood pressure? 1  BMI more than 35 kg/m2? 0  Age over 58 years old? 1  Neck circumference greater than 40 cm/18 inches? 0  Gender: 1  Obstructive Sleep Apnea Score 4   Score 4 or greater  Results sent to PCP

## 2012-07-25 NOTE — H&P (Signed)
  Chief Complaint  Right renal neoplasm   History of Present Illness  [name] [name] is a 56 year old with the following urologic history:  1) Right renal neoplasm: He was incidentally found to have a 1.1 cm possibly enhancing right lateral renal neoplasm on a CT scan performed due to hematuria. This mass did appear to enhance from 52 HU precontrast to 70 HU postscontrast.  He underwent further evaluation with a dedicated MRI of the kidneys on 05/27/12 for further evaluation which confirmed concern about enhancement and malignancy.  2) Urolithiasis: He has a history of calcium oxalate and calcium phosphate urolithiasis. He underwent a CT scan for hematuria on 02/18/12 which demonstrated a 12 mm left UPJ stone and multiple bilateral small renal calculi.  This was his initial stone episode. He was treated with ESWL for his 12 mm UPJ stone on 03/04/12 with successful results.  May 2013: L ESWL (12 mm UPJ stone - calcium oxalate, calcium phosphate) Jul 2013: 24 hr urine - hypercalciuria, high urine sodium  3) Bilateral adrenal adenomas: He was incidentally noted to have bilateral adrenal masses consistent with adenomas by imaging criteria. Biochemical evaluation in August 2013 did not demonstrated biochemical hypersecretion and he has not manifested sign or symptoms of adrenal overactivity.  4) Elevated PSA: He presented to Dr. Annabell Howells initially in 2008 with an elevated PSA.  This appeared to be related to prostatitis and his PSA returned to normal.     Past Medical History Problems  1. History of  Cervical Disc Degeneration 722.4 2. History of  Diabetes Mellitus 250.00 3. History of  Glaucoma 365.9 4. History of  Hypertension 401.9  Surgical History Problems  1. History of  Lithotripsy  Current Meds 1. Benicar HCT TABS; Therapy: (Recorded:25Sep2008) to 2. MetFORMIN HCl 500 MG Oral Tablet; Therapy: 04Dec2012 to 3. Vitamin D TABS; Therapy: (Recorded:03Jun2013) to  Allergies Medication  1.  Codeine Derivatives 2. Ondansetron HCl TABS  Family History Problems  1. Maternal history of  Colon Cancer V16.0 Denied  2. Family history of  Kidney Cancer  Social History Problems    Current Smoker 305.1   Marital History - Currently Married   Occupation: [employer]   Tobacco Use V15.82   Using Marijuana daily smoker Denied    Alcohol Use  Review of Systems Constitutional, skin, eye, otolaryngeal, hematologic/lymphatic, cardiovascular, pulmonary, endocrine, musculoskeletal, gastrointestinal, neurological and psychiatric system(s) were reviewed and pertinent findings if present are noted.    Vitals  BMI Calculated: 32.47 BSA Calculated: 2.01 Height: 5 ft 6 in Weight: 202 lb    Physical Exam Constitutional: Well nourished and well developed . No acute distress.  ENT:. The ears and nose are normal in appearance.  Neck: The appearance of the neck is normal and no neck mass is present.  Pulmonary: No respiratory distress and normal respiratory rhythm and effort.  Cardiovascular: Heart rate and rhythm are normal . No peripheral edema.  Abdomen: The abdomen is rounded. The abdomen is soft and nontender. No masses are palpated. No CVA tenderness. No hernias are palpable. No hepatosplenomegaly noted.  Lymphatics: The supraclavicular, femoral and inguinal nodes are not enlarged or tender.  Skin: Normal skin turgor, no visible rash and no visible skin lesions.     Assessed  1 Renal Neoplasm Right 239.5  Plan   1. 1.1 cm right renal neoplasm concerning for possible malignancy: He has elected to proceed with a right robotic assisted laparoscopic partial nephrectomy.

## 2012-07-27 NOTE — Anesthesia Preprocedure Evaluation (Addendum)
Anesthesia Evaluation  Patient identified by MRN, date of birth, ID band Patient awake    Reviewed: Allergy & Precautions, H&P , NPO status , Patient's Chart, lab work & pertinent test results  Airway Mallampati: II TM Distance: >3 FB Neck ROM: Full    Dental No notable dental hx.    Pulmonary Current Smoker,  breath sounds clear to auscultation  Pulmonary exam normal       Cardiovascular hypertension, Pt. on medications Rhythm:Regular Rate:Normal  CXR and ECG reviewed.   Neuro/Psych  Headaches, negative psych ROS   GI/Hepatic negative GI ROS, Neg liver ROS,   Endo/Other  diabetes, Type 2, Oral Hypoglycemic Agents  Renal/GU Renal disease  negative genitourinary   Musculoskeletal negative musculoskeletal ROS (+)   Abdominal (+) + obese,   Peds negative pediatric ROS (+)  Hematology negative hematology ROS (+)   Anesthesia Other Findings   Reproductive/Obstetrics negative OB ROS                          Anesthesia Physical Anesthesia Plan  ASA: III  Anesthesia Plan: General   Post-op Pain Management:    Induction: Intravenous  Airway Management Planned: Oral ETT  Additional Equipment:   Intra-op Plan:   Post-operative Plan: Extubation in OR  Informed Consent: I have reviewed the patients History and Physical, chart, labs and discussed the procedure including the risks, benefits and alternatives for the proposed anesthesia with the patient or authorized representative who has indicated his/her understanding and acceptance.   Dental advisory given  Plan Discussed with: CRNA  Anesthesia Plan Comments:         Anesthesia Quick Evaluation

## 2012-07-28 ENCOUNTER — Encounter (HOSPITAL_COMMUNITY): Payer: Self-pay | Admitting: *Deleted

## 2012-07-28 ENCOUNTER — Encounter (HOSPITAL_COMMUNITY): Payer: Self-pay | Admitting: Anesthesiology

## 2012-07-28 ENCOUNTER — Ambulatory Visit (HOSPITAL_COMMUNITY): Payer: PRIVATE HEALTH INSURANCE | Admitting: Anesthesiology

## 2012-07-28 ENCOUNTER — Encounter (HOSPITAL_COMMUNITY)
Admission: RE | Disposition: A | Discharge status: Home / Self Care | Payer: Self-pay | Source: Ambulatory Visit | Attending: Urology

## 2012-07-28 ENCOUNTER — Inpatient Hospital Stay (HOSPITAL_COMMUNITY)
Admission: RE | Admit: 2012-07-28 | Discharge: 2012-07-30 | DRG: 658 | Disposition: A | Discharge status: Home / Self Care | Payer: PRIVATE HEALTH INSURANCE | Source: Ambulatory Visit | Attending: Urology | Admitting: Urology

## 2012-07-28 DIAGNOSIS — Z01812 Encounter for preprocedural laboratory examination: Secondary | ICD-10-CM

## 2012-07-28 DIAGNOSIS — F17200 Nicotine dependence, unspecified, uncomplicated: Secondary | ICD-10-CM | POA: Diagnosis present

## 2012-07-28 DIAGNOSIS — H409 Unspecified glaucoma: Secondary | ICD-10-CM | POA: Diagnosis present

## 2012-07-28 DIAGNOSIS — Z87442 Personal history of urinary calculi: Secondary | ICD-10-CM

## 2012-07-28 DIAGNOSIS — C649 Malignant neoplasm of unspecified kidney, except renal pelvis: Principal | ICD-10-CM | POA: Diagnosis present

## 2012-07-28 DIAGNOSIS — M5030 Other cervical disc degeneration, unspecified cervical region: Secondary | ICD-10-CM | POA: Diagnosis present

## 2012-07-28 DIAGNOSIS — C801 Malignant (primary) neoplasm, unspecified: Secondary | ICD-10-CM

## 2012-07-28 DIAGNOSIS — I10 Essential (primary) hypertension: Secondary | ICD-10-CM | POA: Diagnosis present

## 2012-07-28 DIAGNOSIS — Z79899 Other long term (current) drug therapy: Secondary | ICD-10-CM

## 2012-07-28 DIAGNOSIS — E119 Type 2 diabetes mellitus without complications: Secondary | ICD-10-CM | POA: Diagnosis present

## 2012-07-28 HISTORY — DX: Malignant (primary) neoplasm, unspecified: C80.1

## 2012-07-28 HISTORY — PX: KIDNEY SURGERY: SHX687

## 2012-07-28 LAB — GLUCOSE, CAPILLARY
Glucose-Capillary: 140 mg/dL — ABNORMAL HIGH (ref 70–99)
Glucose-Capillary: 166 mg/dL — ABNORMAL HIGH (ref 70–99)
Glucose-Capillary: 152 mg/dL — ABNORMAL HIGH (ref 70–99)
Glucose-Capillary: 114 mg/dL — ABNORMAL HIGH (ref 70–99)
Glucose-Capillary: 138 mg/dL — ABNORMAL HIGH (ref 70–99)
Glucose-Capillary: 182 mg/dL — ABNORMAL HIGH (ref 70–99)

## 2012-07-28 LAB — BASIC METABOLIC PANEL
BUN: 12 mg/dL (ref 6–23)
CO2: 25 mEq/L (ref 19–32)
Calcium: 8.4 mg/dL (ref 8.4–10.5)
Chloride: 101 mEq/L (ref 96–112)
Creatinine, Ser: 0.74 mg/dL (ref 0.50–1.35)
GFR calc Af Amer: >90 mL/min (ref >90)
GFR calc non Af Amer: >90 mL/min (ref >90)
Glucose, Bld: 162 mg/dL — ABNORMAL HIGH (ref 70–99)
Potassium: 3.3 mEq/L — ABNORMAL LOW (ref 3.5–5.1)
Sodium: 134 mEq/L — ABNORMAL LOW (ref 135–145)

## 2012-07-28 LAB — HEMOGLOBIN AND HEMATOCRIT, BLOOD
HCT: 43.9 % (ref 39.0–52.0)
Hemoglobin: 15.4 g/dL (ref 13.0–17.0)

## 2012-07-28 LAB — TYPE AND SCREEN
ABO/RH(D): O POS
Antibody Screen: NEGATIVE

## 2012-07-28 SURGERY — ROBOTIC ASSITED PARTIAL NEPHRECTOMY
Anesthesia: General | Site: Flank | Laterality: Right | Wound class: Clean

## 2012-07-28 MED ORDER — POTASSIUM CHLORIDE IN NACL 20-0.45 MEQ/L-% IV SOLN
INTRAVENOUS | Status: DC
  Administered 2012-07-28: 150 mL/h via INTRAVENOUS
  Administered 2012-07-29 (×3): via INTRAVENOUS
  Filled 2012-07-28 (×7): qty 1000

## 2012-07-28 MED ORDER — PROMETHAZINE HCL 25 MG/ML IJ SOLN: 6.2500 mg | INTRAMUSCULAR | Status: DC | PRN

## 2012-07-28 MED ORDER — HYDROCHLOROTHIAZIDE 12.5 MG PO CAPS
12.5000 mg | ORAL_CAPSULE | Freq: Every day | ORAL | Status: DC
  Administered 2012-07-28 – 2012-07-30 (×3): 12.5 mg via ORAL
  Filled 2012-07-28 (×3): qty 1

## 2012-07-28 MED ORDER — DOCUSATE SODIUM 100 MG PO CAPS
100.0000 mg | ORAL_CAPSULE | Freq: Two times a day (BID) | ORAL | Status: DC
  Administered 2012-07-28 – 2012-07-30 (×5): 100 mg via ORAL
  Filled 2012-07-28 (×7): qty 1

## 2012-07-28 MED ORDER — HYDROMORPHONE HCL PF 1 MG/ML IJ SOLN
INTRAMUSCULAR | Status: AC
  Filled 2012-07-28: qty 2

## 2012-07-28 MED ORDER — ACETAMINOPHEN 10 MG/ML IV SOLN
INTRAVENOUS | Status: AC
  Filled 2012-07-28: qty 100

## 2012-07-28 MED ORDER — SUFENTANIL CITRATE 50 MCG/ML IV SOLN
INTRAVENOUS | Status: DC | PRN
  Administered 2012-07-28 (×3): 10 ug via INTRAVENOUS
  Administered 2012-07-28: 20 ug via INTRAVENOUS

## 2012-07-28 MED ORDER — CEFAZOLIN SODIUM 1-5 GM-% IV SOLN
1.0000 g | Freq: Three times a day (TID) | INTRAVENOUS | Status: AC
  Administered 2012-07-28 – 2012-07-29 (×2): 1 g via INTRAVENOUS
  Filled 2012-07-28 (×2): qty 50

## 2012-07-28 MED ORDER — BUPIVACAINE LIPOSOME 1.3 % IJ SUSP
20.0000 mL | INTRAMUSCULAR | Status: AC
  Filled 2012-07-28: qty 20

## 2012-07-28 MED ORDER — CEFAZOLIN SODIUM-DEXTROSE 2-3 GM-% IV SOLR
2.0000 g | INTRAVENOUS | Status: AC
  Administered 2012-07-28: 2 g via INTRAVENOUS

## 2012-07-28 MED ORDER — SUCCINYLCHOLINE CHLORIDE 20 MG/ML IJ SOLN
INTRAMUSCULAR | Status: DC | PRN
  Administered 2012-07-28 (×2): 100 mg via INTRAVENOUS

## 2012-07-28 MED ORDER — DIPHENHYDRAMINE HCL 50 MG/ML IJ SOLN
12.5000 mg | Freq: Four times a day (QID) | INTRAMUSCULAR | Status: DC | PRN
  Administered 2012-07-29: 12.5 mg via INTRAVENOUS
  Filled 2012-07-28: qty 1

## 2012-07-28 MED ORDER — GLYCOPYRROLATE 0.2 MG/ML IJ SOLN
INTRAMUSCULAR | Status: DC | PRN
  Administered 2012-07-28: .6 mg via INTRAVENOUS

## 2012-07-28 MED ORDER — OLMESARTAN MEDOXOMIL-HCTZ 40-12.5 MG PO TABS: 1.0000 | ORAL_TABLET | Freq: Every day | ORAL | Status: DC

## 2012-07-28 MED ORDER — MORPHINE SULFATE 2 MG/ML IJ SOLN
2.0000 mg | INTRAMUSCULAR | Status: DC | PRN
  Administered 2012-07-28 – 2012-07-29 (×6): 2 mg via INTRAVENOUS
  Filled 2012-07-28 (×6): qty 1

## 2012-07-28 MED ORDER — ONDANSETRON HCL 4 MG/2ML IJ SOLN: 4.0000 mg | INTRAMUSCULAR | Status: DC | PRN

## 2012-07-28 MED ORDER — HYDROMORPHONE HCL PF 1 MG/ML IJ SOLN
0.2500 mg | INTRAMUSCULAR | Status: DC | PRN
  Administered 2012-07-28 (×4): 0.5 mg via INTRAVENOUS

## 2012-07-28 MED ORDER — PROPOFOL 10 MG/ML IV BOLUS
INTRAVENOUS | Status: DC | PRN
  Administered 2012-07-28: 50 mg via INTRAVENOUS
  Administered 2012-07-28: 200 mg via INTRAVENOUS

## 2012-07-28 MED ORDER — EPHEDRINE SULFATE 50 MG/ML IJ SOLN
INTRAMUSCULAR | Status: DC | PRN
  Administered 2012-07-28: 10 mg via INTRAVENOUS

## 2012-07-28 MED ORDER — MANNITOL 25 % IV SOLN
25.0000 g | Freq: Once | INTRAVENOUS | Status: AC
  Administered 2012-07-28 (×2): 12.5 g via INTRAVENOUS
  Filled 2012-07-28: qty 100

## 2012-07-28 MED ORDER — ACETAMINOPHEN 10 MG/ML IV SOLN
INTRAVENOUS | Status: DC | PRN
  Administered 2012-07-28: 1000 mg via INTRAVENOUS

## 2012-07-28 MED ORDER — IRBESARTAN 300 MG PO TABS
300.0000 mg | ORAL_TABLET | Freq: Every day | ORAL | Status: DC
  Administered 2012-07-28 – 2012-07-30 (×3): 300 mg via ORAL
  Filled 2012-07-28 (×3): qty 1

## 2012-07-28 MED ORDER — ACETAMINOPHEN 10 MG/ML IV SOLN
1000.0000 mg | Freq: Four times a day (QID) | INTRAVENOUS | Status: AC
  Administered 2012-07-28 – 2012-07-29 (×4): 1000 mg via INTRAVENOUS
  Filled 2012-07-28 (×5): qty 100

## 2012-07-28 MED ORDER — CEFAZOLIN SODIUM-DEXTROSE 2-3 GM-% IV SOLR
INTRAVENOUS | Status: AC
  Filled 2012-07-28: qty 50

## 2012-07-28 MED ORDER — BUPIVACAINE LIPOSOME 1.3 % IJ SUSP
INTRAMUSCULAR | Status: DC | PRN
  Administered 2012-07-28: 40 mL

## 2012-07-28 MED ORDER — INSULIN ASPART 100 UNIT/ML ~~LOC~~ SOLN
0.0000 [IU] | SUBCUTANEOUS | Status: DC
  Administered 2012-07-28: 3 [IU] via SUBCUTANEOUS
  Administered 2012-07-28: 2 [IU] via SUBCUTANEOUS
  Administered 2012-07-28: 20:00:00 via SUBCUTANEOUS
  Administered 2012-07-29: 3 [IU] via SUBCUTANEOUS
  Administered 2012-07-29 (×2): 2 [IU] via SUBCUTANEOUS
  Administered 2012-07-29: 3 [IU] via SUBCUTANEOUS
  Administered 2012-07-29: 2 [IU] via SUBCUTANEOUS
  Administered 2012-07-29: 3 [IU] via SUBCUTANEOUS
  Administered 2012-07-30: 2 [IU] via SUBCUTANEOUS

## 2012-07-28 MED ORDER — MIDAZOLAM HCL 5 MG/5ML IJ SOLN
INTRAMUSCULAR | Status: DC | PRN
  Administered 2012-07-28: 2 mg via INTRAVENOUS

## 2012-07-28 MED ORDER — NEOSTIGMINE METHYLSULFATE 1 MG/ML IJ SOLN
INTRAMUSCULAR | Status: DC | PRN
  Administered 2012-07-28: 4 mg via INTRAVENOUS

## 2012-07-28 MED ORDER — DIPHENHYDRAMINE HCL 12.5 MG/5ML PO ELIX: 12.5000 mg | ORAL_SOLUTION | Freq: Four times a day (QID) | ORAL | Status: DC | PRN

## 2012-07-28 MED ORDER — ZOLPIDEM TARTRATE 5 MG PO TABS: 5.0000 mg | ORAL_TABLET | Freq: Every evening | ORAL | Status: DC | PRN

## 2012-07-28 MED ORDER — LACTATED RINGERS IV SOLN: INTRAVENOUS | Status: DC

## 2012-07-28 MED ORDER — LIDOCAINE HCL 4 % MT SOLN
OROMUCOSAL | Status: DC | PRN
  Administered 2012-07-28: 4 mL via TOPICAL

## 2012-07-28 MED ORDER — CISATRACURIUM BESYLATE (PF) 10 MG/5ML IV SOLN
INTRAVENOUS | Status: DC | PRN
  Administered 2012-07-28: 4 mg via INTRAVENOUS
  Administered 2012-07-28: 14 mg via INTRAVENOUS

## 2012-07-28 MED ORDER — LACTATED RINGERS IR SOLN
Status: DC | PRN
  Administered 2012-07-28: 1000 mL

## 2012-07-28 MED ORDER — LACTATED RINGERS IV SOLN
INTRAVENOUS | Status: DC | PRN
  Administered 2012-07-28 (×2): via INTRAVENOUS

## 2012-07-28 MED ORDER — ONDANSETRON HCL 4 MG/2ML IJ SOLN
INTRAMUSCULAR | Status: DC | PRN
  Administered 2012-07-28: 4 mg via INTRAVENOUS

## 2012-07-28 MED ORDER — LIDOCAINE HCL (CARDIAC) 20 MG/ML IV SOLN
INTRAVENOUS | Status: DC | PRN
  Administered 2012-07-28: 75 mg via INTRAVENOUS

## 2012-07-28 SURGICAL SUPPLY — 60 items
APPLICATOR SURGIFLO ENDO (HEMOSTASIS) ×2 IMPLANT
CANNULA SEAL DVNC (CANNULA) ×3 IMPLANT
CANNULA SEALS DA VINCI (CANNULA) ×3
CHLORAPREP W/TINT 26ML (MISCELLANEOUS) ×2 IMPLANT
CLIP LIGATING HEM O LOK PURPLE (MISCELLANEOUS) ×2 IMPLANT
CLIP LIGATING HEMO O LOK GREEN (MISCELLANEOUS) ×4 IMPLANT
CLOTH BEACON ORANGE TIMEOUT ST (SAFETY) ×2 IMPLANT
CORDS BIPOLAR (ELECTRODE) ×2 IMPLANT
COVER SURGICAL LIGHT HANDLE (MISCELLANEOUS) ×2 IMPLANT
COVER TIP SHEARS 8 DVNC (MISCELLANEOUS) ×1 IMPLANT
COVER TIP SHEARS 8MM DA VINCI (MISCELLANEOUS) ×1
DECANTER SPIKE VIAL GLASS SM (MISCELLANEOUS) ×2 IMPLANT
DERMABOND ADVANCED (GAUZE/BANDAGES/DRESSINGS) ×2
DERMABOND ADVANCED .7 DNX12 (GAUZE/BANDAGES/DRESSINGS) ×2 IMPLANT
DRAIN CHANNEL 15F RND FF 3/16 (WOUND CARE) ×2 IMPLANT
DRAPE INCISE IOBAN 66X45 STRL (DRAPES) ×2 IMPLANT
DRAPE LAPAROSCOPIC ABDOMINAL (DRAPES) ×2 IMPLANT
DRAPE LG THREE QUARTER DISP (DRAPES) ×4 IMPLANT
DRAPE TABLE BACK 44X90 PK DISP (DRAPES) ×2 IMPLANT
DRAPE WARM FLUID 44X44 (DRAPE) ×2 IMPLANT
DRESSING SURGICEL FIBRLLR 1X2 (HEMOSTASIS) ×1 IMPLANT
DRSG SURGICEL FIBRILLAR 1X2 (HEMOSTASIS) ×2
ELECT REM PT RETURN 9FT ADLT (ELECTROSURGICAL) ×4
ELECTRODE REM PT RTRN 9FT ADLT (ELECTROSURGICAL) ×2 IMPLANT
EVACUATOR SILICONE 100CC (DRAIN) ×2 IMPLANT
GAUZE VASELINE 3X9 (GAUZE/BANDAGES/DRESSINGS) IMPLANT
GLOVE BIO SURGEON STRL SZ 6.5 (GLOVE) ×2 IMPLANT
GLOVE BIOGEL M STRL SZ7.5 (GLOVE) ×4 IMPLANT
GOWN STRL NON-REIN LRG LVL3 (GOWN DISPOSABLE) ×6 IMPLANT
GOWN STRL REIN XL XLG (GOWN DISPOSABLE) ×2 IMPLANT
HEMOSTAT SURGICEL 4X8 (HEMOSTASIS) IMPLANT
KIT ACCESSORY DA VINCI DISP (KITS) ×1
KIT ACCESSORY DVNC DISP (KITS) ×1 IMPLANT
KIT BASIN OR (CUSTOM PROCEDURE TRAY) ×2 IMPLANT
NS IRRIG 1000ML POUR BTL (IV SOLUTION) ×4 IMPLANT
PENCIL BUTTON HOLSTER BLD 10FT (ELECTRODE) ×2 IMPLANT
POSITIONER SURGICAL ARM (MISCELLANEOUS) ×4 IMPLANT
POUCH SPECIMEN RETRIEVAL 10MM (ENDOMECHANICALS) ×2 IMPLANT
SET TUBE IRRIG SUCTION NO TIP (IRRIGATION / IRRIGATOR) ×2 IMPLANT
SOLUTION ANTI FOG 6CC (MISCELLANEOUS) ×2 IMPLANT
SOLUTION ELECTROLUBE (MISCELLANEOUS) ×2 IMPLANT
SPONGE LAP 18X18 X RAY DECT (DISPOSABLE) IMPLANT
SURGIFLO W/THROMBIN 8M KIT (HEMOSTASIS) ×2 IMPLANT
SUT ETHILON 3 0 PS 1 (SUTURE) ×2 IMPLANT
SUT MNCRL AB 4-0 PS2 18 (SUTURE) ×4 IMPLANT
SUT V-LOC BARB 180 2/0GR6 GS22 (SUTURE) ×2
SUT V-LOC BARB 180 2/0GR9 GS23 (SUTURE) ×2
SUT VIC AB 0 CT1 27 (SUTURE) ×1
SUT VIC AB 0 CT1 27XBRD ANTBC (SUTURE) ×1 IMPLANT
SUT VICRYL 0 UR6 27IN ABS (SUTURE) ×4 IMPLANT
SUTURE V-LC BRB 180 2/0GR6GS22 (SUTURE) ×1 IMPLANT
SUTURE V-LC BRB 180 2/0GR9GS23 (SUTURE) ×1 IMPLANT
SYR BULB IRRIGATION 50ML (SYRINGE) IMPLANT
TOWEL OR NON WOVEN STRL DISP B (DISPOSABLE) ×2 IMPLANT
TRAY FOLEY CATH 14FRSI W/METER (CATHETERS) ×2 IMPLANT
TRAY LAP CHOLE (CUSTOM PROCEDURE TRAY) ×2 IMPLANT
TROCAR ENDOPATH XCEL 12X100 BL (ENDOMECHANICALS) ×2 IMPLANT
TROCAR XCEL 12X100 BLDLESS (ENDOMECHANICALS) ×2 IMPLANT
TUBING INSUFFLATION 10FT LAP (TUBING) ×2 IMPLANT
WATER STERILE IRR 1500ML POUR (IV SOLUTION) ×4 IMPLANT

## 2012-07-28 NOTE — Anesthesia Postprocedure Evaluation (Signed)
  Anesthesia Post-op Note  Patient: [name] [name]  Procedure(s) Performed: Procedure(s) (LRB): ROBOTIC ASSITED PARTIAL NEPHRECTOMY (Right)  Patient Location: PACU  Anesthesia Type: General  Level of Consciousness: awake and alert   Airway and Oxygen Therapy: Patient Spontanous Breathing  Post-op Pain: mild  Post-op Assessment: Post-op Vital signs reviewed, Patient's Cardiovascular Status Stable, Respiratory Function Stable, Patent Airway and No signs of Nausea or vomiting  Post-op Vital Signs: stable  Complications: No apparent anesthesia complications

## 2012-07-28 NOTE — Op Note (Signed)
Preoperative diagnosis: Right renal neoplasm  Postoperative diagnosis: RIght renal neoplasm  Procedure:  1. Right robotic-assisted laparoscopic partial nephrectomy 2. Intraoperative renal ultrasonography  Surgeon: Moody Bruins. M.D.  Assistant(s): Pecola Leisure, PA-C  Anesthesia: General  Complications: None  EBL: 75 mL  IVF:  1600 mL crystalloid  Specimens: 1. Right renal neoplasm  Disposition of specimens: Pathology  Intraoperative findings:       1. Warm renal ischemia time: 13 minutes       2. Intraoperative renal ultrasound findings: A solid appearing 1.3 cm mass was noted over the interpolar region of the right kidney in the expected location based on preoperative imaging.  Drains: 1. # 15 Blake perinephric drain  Indication:  [name] [name] is a 56 y.o. year old patient with a right renal neoplasm.  After a thorough review of the management options for their renal mass, they elected to proceed with surgical treatment and the above procedure.  We have discussed the potential benefits and risks of the procedure, side effects of the proposed treatment, the likelihood of the patient achieving the goals of the procedure, and any potential problems that might occur during the procedure or recuperation. Informed consent has been obtained.   Description of procedure:  The patient was taken to the operating room and a general anesthetic was administered. The patient was given preoperative antibiotics, placed in the right modified flank position with care to pad all potential pressure points, and prepped and draped in the usual sterile fashion. Next a preoperative timeout was performed.  A site was selected on the right side of the umbilicus for placement of the camera port. This was placed using a standard open Hassan technique which allowed entry into the peritoneal cavity under direct vision and without difficulty. A 12 mm port was placed and a pneumoperitoneum  established. The camera was then used to inspect the abdomen and there was no evidence of any intra-abdominal injuries or other abnormalities. The remaining abdominal ports were then placed. 8 mm robotic ports were placed in the right upper quadrant, right lower quadrant, and far right lateral abdominal wall. A 12 mm port was placed in the upper midline for laparoscopic assistance. All ports were placed under direct vision without difficulty. The surgical cart was then docked.   Utilizing the cautery scissors, the white line of Toldt was incised allowing the colon to be mobilized medially and the plane between the mesocolon and the anterior layer of Gerota's fascia to be developed and the kidney to be exposed.  The ureter and gonadal vein were identified inferiorly and the ureter was lifted anteriorly off the psoas muscle.  Dissection proceeded superiorly along the gonadal vein until the renal vein was identified.  The renal hilum was then carefully isolated with a combination of blunt and sharp dissection allowing the renal arterial and venous structures to be separated and isolated in preparation for renal hilar vessel clamping. There was a single renal artery and single renal vein.  12.5 g of IV mannitol was then administered.   Attention turned to the kidney and the perinephric fat surrounding the renal mass was removed and the kidney was mobilized sufficiently for exposure and resection of the renal mass. The mass was identified although was small and to confirm proper identification, it was felt that intraoperative renal ultrasonography would be helpful.  Intraoperative renal ultrasonography was utilized with the laparoscopic ultrasound probe to identify the renal tumor and identify the tumor margins. A small 1.3 cm  solid appearing mass was located in the lateral interpolar region of the kidney.  A large simple appearing renal cyst was located more superiorly.  These findings correlated with preoperative  imaging findings.   Once the renal mass was properly isolated, preparations were made for resection of the tumor.  Reconstructive sutures were placed into the abdomen for the renorrhaphy portion of the procedure.  The renal artery was then clamped with bulldog clamps.  The tumor was then excised with cold scissor dissection along with an adequate visible gross margin of normal renal parenchyma. The tumor appeared to be excised without any gross violation of the tumor. The renal collecting system was not entered during removal of the tumor.  A running 3-0 V-lock suture was then brought through the capsule of the kidney and run along the base of the renal defect to provide hemostasis and close any entry into the renal collecting system if present. Weck clips were used to secure this suture outside the renal capsule at the proximal and distal ends. Surgiflo was then placed into the renal defect and the renal parenchyma was closed with a 2-0 V-lock running capsular suture using a sliding hemolok technique which resulted in excellent compression of the renal defect.    The bulldog clamps were then removed from the renal hilar vessel(s) and an additional 12.5 g of IV mannitol was administered. Total warm renal ischemia time was 13 minutes. The renal tumor resection site was examined. Hemostasis appeared adequate.   The kidney was placed back into its normal anatomic position and covered with perinephric fat as needed.  A # 15 Blake drain was then brought through the lateral lower port site and positioned in the perinephric space.  It was secured to the skin with a nylon suture. The surgical cart was undocked.  The renal tumor specimen was removed intact within an endopouch retrieval bag via the camera port sites.  The camera port site and the other 12 mm port site were then closed at the fascial level with 0-vicryl suture.  All other laparoscopic/robotic ports were removed under direct vision and the  pneumoperitoneum let down with inspection of the operative field performed and hemostasis again confirmed. All incision sites were then injected with local anesthetic and reapproximated at the skin level with 4-0 monocryl subcuticular closures.  Dermabond was applied to the skin.  The patient tolerated the procedure well and without complications.  The patient was able to be extubated and transferred to the recovery unit in satisfactory condition.  Moody Bruins MD

## 2012-07-28 NOTE — Addendum Note (Signed)
Addendum  created 07/28/12 1300 by Florene Route, CRNA   Modules edited:Anesthesia Medication Administration

## 2012-07-28 NOTE — Progress Notes (Signed)
   CARE MANAGEMENT NOTE 07/28/2012  Patient:  [name] [name], [name] [name]   Account Number:  [id number]  Date Initiated:  07/28/2012  Documentation initiated by:  Jiles Crocker  Subjective/Objective Assessment:   ADMITTED FOR SURGERY - Right robotic-assisted laparoscopic partial nephrectomy  Intraoperative renal ultrasonography     Action/Plan:   LIVES WITH SPOUSE   Anticipated DC Date:  08/01/2012   Anticipated DC Plan:  HOME/SELF CARE      DC Planning Services  CM consult               Status of service:  In process, will continue to follow Medicare Important Message given?  NA - LOS <3 / Initial given by admissions (If response is "NO", the following Medicare IM given date fields will be blank)  Per UR Regulation:  Reviewed for med. necessity/level of care/duration of stay  Comments:  07/28/2012- B Udell Mazzocco RN, BSN, MHA

## 2012-07-28 NOTE — Preoperative (Signed)
Beta Blockers   Reason not to administer Beta Blockers:Not Applicable 

## 2012-07-28 NOTE — Addendum Note (Signed)
Addendum  created 07/28/12 1300 by Matha Masse L Reinhardt Licausi, CRNA   Modules edited:Anesthesia Medication Administration    

## 2012-07-28 NOTE — Transfer of Care (Signed)
Immediate Anesthesia Transfer of Care Note  Patient: [name] [name]  Procedure(s) Performed: Procedure(s) (LRB) with comments: ROBOTIC ASSITED PARTIAL NEPHRECTOMY (Right) -    Patient Location: PACU  Anesthesia Type: General  Level of Consciousness: awake  Airway & Oxygen Therapy: Patient Spontanous Breathing and Patient connected to face mask oxygen  Post-op Assessment: Report given to PACU RN and Post -op Vital signs reviewed and stable  Post vital signs: Reviewed and stable  Complications: No apparent anesthesia complications

## 2012-07-28 NOTE — Anesthesia Procedure Notes (Signed)
Procedure Name: Intubation Date/Time: 07/28/2012 7:32 AM Performed by: Leroy Libman L Patient Re-evaluated:Patient Re-evaluated prior to inductionOxygen Delivery Method: Circle system utilized Preoxygenation: Pre-oxygenation with 100% oxygen Intubation Type: IV induction Ventilation: Mask ventilation without difficulty and Oral airway inserted - appropriate to patient size Grade View: Grade IV Tube size: 8.0 mm Number of attempts: 3 Airway Equipment and Method: Video-laryngoscopy,  Rigid stylet and LTA kit utilized Placement Confirmation: ETT inserted through vocal cords under direct vision,  breath sounds checked- equal and bilateral and positive ETCO2 Secured at: 22 cm Tube secured with: Tape Dental Injury: Teeth and Oropharynx as per pre-operative assessment and Injury to lip  Difficulty Due To: Difficulty was unanticipated, Difficult Airway- due to anterior larynx, Difficult Airway- due to limited oral opening and Difficult Airway- due to reduced neck mobility Future Recommendations: Recommend- induction with short-acting agent, and alternative techniques readily available Comments: Unable to visualize cords with Miller 3 or Mac 4  Glidescope used and difficult to see cords with Video.

## 2012-07-29 LAB — BASIC METABOLIC PANEL
BUN: 10 mg/dL (ref 6–23)
CO2: 26 mEq/L (ref 19–32)
Calcium: 8.9 mg/dL (ref 8.4–10.5)
Chloride: 104 mEq/L (ref 96–112)
Creatinine, Ser: 0.89 mg/dL (ref 0.50–1.35)
GFR calc Af Amer: >90 mL/min (ref >90)
GFR calc non Af Amer: >90 mL/min (ref >90)
Glucose, Bld: 135 mg/dL — ABNORMAL HIGH (ref 70–99)
Potassium: 3.7 mEq/L (ref 3.5–5.1)
Sodium: 139 mEq/L (ref 135–145)

## 2012-07-29 LAB — HEMOGLOBIN AND HEMATOCRIT, BLOOD
HCT: 45.4 % (ref 39.0–52.0)
Hemoglobin: 15.7 g/dL (ref 13.0–17.0)

## 2012-07-29 LAB — GLUCOSE, CAPILLARY
Glucose-Capillary: 107 mg/dL — ABNORMAL HIGH (ref 70–99)
Glucose-Capillary: 124 mg/dL — ABNORMAL HIGH (ref 70–99)
Glucose-Capillary: 130 mg/dL — ABNORMAL HIGH (ref 70–99)
Glucose-Capillary: 145 mg/dL — ABNORMAL HIGH (ref 70–99)
Glucose-Capillary: 160 mg/dL — ABNORMAL HIGH (ref 70–99)
Glucose-Capillary: 168 mg/dL — ABNORMAL HIGH (ref 70–99)
Glucose-Capillary: 180 mg/dL — ABNORMAL HIGH (ref 70–99)

## 2012-07-29 LAB — CREATININE, FLUID (PLEURAL, PERITONEAL, JP DRAINAGE): Creat, Fluid: 0.7 mg/dL

## 2012-07-29 MED ORDER — BISACODYL 10 MG RE SUPP
10.0000 mg | Freq: Once | RECTAL | Status: AC
  Administered 2012-07-29: 10 mg via RECTAL
  Filled 2012-07-29: qty 1

## 2012-07-29 MED ORDER — HYDROCODONE-ACETAMINOPHEN 5-325 MG PO TABS
1.0000 | ORAL_TABLET | Freq: Four times a day (QID) | ORAL | Status: DC | PRN
  Administered 2012-07-29: 1 via ORAL
  Administered 2012-07-29 – 2012-07-30 (×2): 2 via ORAL
  Filled 2012-07-29 (×3): qty 2

## 2012-07-29 NOTE — Progress Notes (Signed)
Patient ID: [name] [name], male   DOB: [birth year], 56 y.o.   MRN: [id number]  1 Day Post-Op Subjective: The patient is doing well.  No nausea or vomiting. Pain is adequately controlled.  Objective: Vital signs in last 24 hours: Temp:  [97.1 F (36.2 C)-98.7 F (37.1 C)] 98.5 F (36.9 C) (10/15 0540) Pulse Rate:  [63-76] 70  (10/15 0540) Resp:  [11-20] 18  (10/15 0540) BP: (136-163)/(75-99) 154/81 mmHg (10/15 0540) SpO2:  [97 %-100 %] 97 % (10/15 0540) Weight:  [92 kg (202 lb 13.2 oz)] 92 kg (202 lb 13.2 oz) (10/14 1107)  Intake/Output from previous day: 10/14 0701 - 10/15 0700 In: 5284.5 [P.O.:360; I.V.:4414.5; IV Piggyback:400] Out: 4385 [Urine:4150; Drains:160; Blood:75] Intake/Output this shift:    Physical Exam:  General: Alert and oriented. CV: RRR Lungs: Clear bilaterally. GI: Soft, Nondistended. Incisions: Clean and dry. Urine: Clear Extremities: Nontender, no erythema, no edema.  Lab Results:  Basename 07/29/12 0436 07/28/12 1913  HGB 15.7 15.4  HCT 45.4 43.9          Basename 07/29/12 0436 07/28/12 1913 07/23/12 1415  CREATININE 0.89 0.74 0.75           Results for orders placed during the hospital encounter of 07/28/12 (from the past 24 hour(s))  GLUCOSE, CAPILLARY     Status: Abnormal   Collection Time   07/28/12 10:10 AM      Component Value Range   Glucose-Capillary 166 (*) 70 - 99 mg/dL  GLUCOSE, CAPILLARY     Status: Abnormal   Collection Time   07/28/12 12:49 PM      Component Value Range   Glucose-Capillary 152 (*) 70 - 99 mg/dL  GLUCOSE, CAPILLARY     Status: Abnormal   Collection Time   07/28/12  2:42 PM      Component Value Range   Glucose-Capillary 114 (*) 70 - 99 mg/dL  GLUCOSE, CAPILLARY     Status: Abnormal   Collection Time   07/28/12  4:34 PM      Component Value Range   Glucose-Capillary 138 (*) 70 - 99 mg/dL  BASIC METABOLIC PANEL     Status: Abnormal   Collection Time   07/28/12  7:13 PM      Component Value Range   Sodium 134 (*) 135 - 145 mEq/L   Potassium 3.3 (*) 3.5 - 5.1 mEq/L   Chloride 101  96 - 112 mEq/L   CO2 25  19 - 32 mEq/L   Glucose, Bld 162 (*) 70 - 99 mg/dL   BUN 12  6 - 23 mg/dL   Creatinine, Ser 4.09  0.50 - 1.35 mg/dL   Calcium 8.4  8.4 - 81.1 mg/dL   GFR calc non Af Amer >90  >90 mL/min   GFR calc Af Amer >90  >90 mL/min  HEMOGLOBIN AND HEMATOCRIT, BLOOD     Status: Normal   Collection Time   07/28/12  7:13 PM      Component Value Range   Hemoglobin 15.4  13.0 - 17.0 g/dL   HCT 91.4  78.2 - 95.6 %  GLUCOSE, CAPILLARY     Status: Abnormal   Collection Time   07/28/12  7:59 PM      Component Value Range   Glucose-Capillary 182 (*) 70 - 99 mg/dL   Comment 1 Documented in Chart     Comment 2 Notify RN    GLUCOSE, CAPILLARY     Status: Abnormal   Collection Time  07/28/12 11:52 PM      Component Value Range   Glucose-Capillary 107 (*) 70 - 99 mg/dL   Comment 1 Documented in Chart     Comment 2 Notify RN    GLUCOSE, CAPILLARY     Status: Abnormal   Collection Time   07/29/12  3:52 AM      Component Value Range   Glucose-Capillary 130 (*) 70 - 99 mg/dL   Comment 1 Documented in Chart     Comment 2 Notify RN    BASIC METABOLIC PANEL     Status: Abnormal   Collection Time   07/29/12  4:36 AM      Component Value Range   Sodium 139  135 - 145 mEq/L   Potassium 3.7  3.5 - 5.1 mEq/L   Chloride 104  96 - 112 mEq/L   CO2 26  19 - 32 mEq/L   Glucose, Bld 135 (*) 70 - 99 mg/dL   BUN 10  6 - 23 mg/dL   Creatinine, Ser 1.61  0.50 - 1.35 mg/dL   Calcium 8.9  8.4 - 09.6 mg/dL   GFR calc non Af Amer >90  >90 mL/min   GFR calc Af Amer >90  >90 mL/min  HEMOGLOBIN AND HEMATOCRIT, BLOOD     Status: Normal   Collection Time   07/29/12  4:36 AM      Component Value Range   Hemoglobin 15.7  13.0 - 17.0 g/dL   HCT 04.5  40.9 - 81.1 %    Assessment/Plan: POD# 1 s/p robotic partial nephrectomy.  1) Ambulate, Incentive spirometry 2) Advance diet as tolerated to diabetic  diet 3) Transition to oral pain medication 4) Dulcolax suppository 5) D/C urethral catheter 6) Monitor renal function   Moody Bruins. MD   LOS: 1 day   Maelie Chriswell,LES 07/29/2012, 7:19 AM

## 2012-07-29 NOTE — Progress Notes (Signed)
Patient ID: [name] [name], male   DOB: 11-Jan-1956, 56 y.o.   MRN: [id number]  Doing well.  BM x 2. Tolerating regular diet.   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

## 2012-07-30 LAB — BASIC METABOLIC PANEL
BUN: 10 mg/dL (ref 6–23)
CO2: 23 mEq/L (ref 19–32)
Calcium: 8.7 mg/dL (ref 8.4–10.5)
Chloride: 101 mEq/L (ref 96–112)
Creatinine, Ser: 0.77 mg/dL (ref 0.50–1.35)
GFR calc Af Amer: >90 mL/min (ref >90)
GFR calc non Af Amer: >90 mL/min (ref >90)
Glucose, Bld: 138 mg/dL — ABNORMAL HIGH (ref 70–99)
Potassium: 3 mEq/L — ABNORMAL LOW (ref 3.5–5.1)
Sodium: 136 mEq/L (ref 135–145)

## 2012-07-30 LAB — GLUCOSE, CAPILLARY
Glucose-Capillary: 144 mg/dL — ABNORMAL HIGH (ref 70–99)
Glucose-Capillary: 119 mg/dL — ABNORMAL HIGH (ref 70–99)

## 2012-07-30 MED ORDER — POTASSIUM CHLORIDE CRYS ER 20 MEQ PO TBCR
40.0000 meq | EXTENDED_RELEASE_TABLET | Freq: Once | ORAL | Status: AC
  Administered 2012-07-30: 40 meq via ORAL
  Filled 2012-07-30: qty 2

## 2012-07-30 MED ORDER — DOCUSATE SODIUM 100 MG PO CAPS: 100.0000 mg | ORAL_CAPSULE | Freq: Two times a day (BID) | ORAL | Status: DC

## 2012-07-30 MED ORDER — HYDROCODONE-ACETAMINOPHEN 5-325 MG PO TABS: 1.0000 | ORAL_TABLET | Freq: Four times a day (QID) | ORAL | Status: DC | PRN

## 2012-07-30 NOTE — Progress Notes (Signed)
Patient ID: [name] [name], male   DOB: 04-Apr-1956, 56 y.o.   MRN: [id number]  2 Days Post-Op Subjective: The patient is doing well.  No nausea or vomiting. Pain is adequately controlled. Tolerating regular diet.  Objective: Vital signs in last 24 hours: Temp:  [98.5 F (36.9 C)-99.5 F (37.5 C)] 99 F (37.2 C) (10/16 0423) Pulse Rate:  [72-84] 72  (10/16 0423) Resp:  [19-20] 20  (10/15 1845) BP: (149-166)/(78-95) 154/78 mmHg (10/16 0423) SpO2:  [97 %-98 %] 97 % (10/16 0423)  Intake/Output from previous day: 10/15 0701 - 10/16 0700 In: 1817.5 [P.O.:960; I.V.:857.5] Out: 2991 [Urine:2850; Drains:140; Stool:1] Intake/Output this shift: Total I/O In: 240 [P.O.:240] Out: 770 [Urine:700; Drains:70]  Physical Exam:  General: Alert and oriented. CV: RRR Lungs: Clear bilaterally. GI: Soft, Nondistended. Incisions: Clean and dry. Extremities: Nontender, no erythema, no edema.  Lab Results:  Basename 07/29/12 0436 07/28/12 1913  HGB 15.7 15.4  HCT 45.4 43.9          Basename 07/30/12 0449 07/29/12 0436 07/28/12 1913  CREATININE 0.77 0.89 0.74           Results for orders placed during the hospital encounter of 07/28/12 (from the past 24 hour(s))  GLUCOSE, CAPILLARY     Status: Abnormal   Collection Time   07/29/12  7:25 AM      Component Value Range   Glucose-Capillary 168 (*) 70 - 99 mg/dL  GLUCOSE, CAPILLARY     Status: Abnormal   Collection Time   07/29/12 12:00 PM      Component Value Range   Glucose-Capillary 124 (*) 70 - 99 mg/dL  GLUCOSE, CAPILLARY     Status: Abnormal   Collection Time   07/29/12  4:35 PM      Component Value Range   Glucose-Capillary 160 (*) 70 - 99 mg/dL  CREATININE, BODY FLUID     Status: Normal   Collection Time   07/29/12  7:17 PM      Component Value Range   Creat, Fluid 0.7     Fluid Type-FCRE DRAINAGE    GLUCOSE, CAPILLARY     Status: Abnormal   Collection Time   07/29/12  7:59 PM      Component Value Range   Glucose-Capillary 180 (*) 70 - 99 mg/dL  GLUCOSE, CAPILLARY     Status: Abnormal   Collection Time   07/29/12 11:42 PM      Component Value Range   Glucose-Capillary 145 (*) 70 - 99 mg/dL   Comment 1 Notify RN    BASIC METABOLIC PANEL     Status: Abnormal   Collection Time   07/30/12  4:49 AM      Component Value Range   Sodium 136  135 - 145 mEq/L   Potassium 3.0 (*) 3.5 - 5.1 mEq/L   Chloride 101  96 - 112 mEq/L   CO2 23  19 - 32 mEq/L   Glucose, Bld 138 (*) 70 - 99 mg/dL   BUN 10  6 - 23 mg/dL   Creatinine, Ser 4.09  0.50 - 1.35 mg/dL   Calcium 8.7  8.4 - 81.1 mg/dL   GFR calc non Af Amer >90  >90 mL/min   GFR calc Af Amer >90  >90 mL/min  GLUCOSE, CAPILLARY     Status: Abnormal   Collection Time   07/30/12  4:51 AM      Component Value Range   Glucose-Capillary 144 (*) 70 - 99 mg/dL  Comment 1 Notify RN      JP Cr 0.7  Assessment/Plan: POD# 2 s/p robotic partial nephrectomy.  1) Ambulate, Incentive spirometry 2) D/C drain 3) Replace potassium 4) D/C home  Moody Bruins. MD   LOS: 2 days   Robinn Overholt,LES 07/30/2012, 6:36 AM

## 2012-07-31 NOTE — Discharge Summary (Signed)
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 Briefly, [name] [name] is a 56 y.o. year old patient with the following urologic history:  1) Right renal neoplasm: He was incidentally found to have a 1.1 cm possibly enhancing right lateral renal neoplasm on a CT scan performed due to hematuria. This mass did appear to enhance from 52 HU precontrast to 70 HU postscontrast. He underwent further evaluation with a dedicated MRI of the kidneys on 05/27/12 for further evaluation which confirmed concern about enhancement and malignancy.  2) Urolithiasis: He has a history of calcium oxalate and calcium phosphate urolithiasis. He underwent a CT scan for hematuria on 02/18/12 which demonstrated a 12 mm left UPJ stone and multiple bilateral small renal calculi. This was his initial stone episode. He was treated with ESWL for his 12 mm UPJ stone on 03/04/12 with successful results.  May 2013: L ESWL (12 mm UPJ stone - calcium oxalate, calcium phosphate)  Jul 2013: 24 hr urine - hypercalciuria, high urine sodium  3) Bilateral adrenal adenomas: He was incidentally noted to have bilateral adrenal masses consistent with adenomas by imaging criteria. Biochemical evaluation in August 2013 did not demonstrated biochemical hypersecretion and he has not manifested sign or symptoms of adrenal overactivity.  4) Elevated PSA: He presented to Dr. Annabell Howells initially in 2008 with an elevated PSA. This appeared to be related to prostatitis and his PSA returned to normal.   Hospital Course:  Pt was admitted and taken to the OR on 07/28/12 for right robotic assisted laparoscopic partial  nephrectomy.  Pt tolerated the procedure well and was hemodynamically stable immediately post op.  He was extubated without complication and woke up from anesthesia neurologically intact.  Pt was transferred to the floor without difficulty.  His post op course progressed as expected.  He remained hemodynamically stable.  His was able to tolerate a regular diet, ambulate and void all without difficulty.  His JP drain was removed and he was discharged home in stable condition on POD 2.    Laboratory values:  Basename 07/29/12 0436 07/28/12 1913  HGB 15.7 15.4  HCT 45.4 43.9    Disposition: Home  Discharge instruction: The patient was instructed to be ambulatory but to refrain from heavy lifting, strenuous activity, or driving.   Discharge medications:     Medication List     As of 07/31/2012  9:25 AM    START taking these medications         docusate sodium 100 MG capsule   Commonly known as: COLACE   Take 1 capsule (100 mg total) by mouth 2 (two) times daily.      HYDROcodone-acetaminophen 5-325 MG per tablet   Commonly known as: NORCO/VICODIN   Take 1-2 tablets by mouth every 6 (six) hours as needed for pain.      CONTINUE taking these medications         clobetasol 0.05 % external solution   Commonly known as: TEMOVATE   Apply 1 application topically 2 (two) times daily. Apply to scalp      metFORMIN 1000 MG tablet   Commonly known as: GLUCOPHAGE      olmesartan-hydrochlorothiazide 40-12.5 MG per tablet   Commonly known as: [employer]  HCT      OVER THE COUNTER MEDICATION      STOP taking these medications         ibuprofen 800 MG tablet   Commonly known as: ADVIL,MOTRIN      Vitamin D3 1000 UNITS Caps          Where to get your medications    These are the prescriptions that you need to pick up.   You may get these medications from any pharmacy.         docusate sodium 100 MG capsule   HYDROcodone-acetaminophen 5-325 MG per tablet           Followup:    Follow-up Information    Follow up with BORDEN,LES, MD. On 08/21/2012. (at 4:00)    Contact information:   [address] [place] [address] [phone]

## 2012-08-05 ENCOUNTER — Encounter: Payer: PRIVATE HEALTH INSURANCE | Admitting: Internal Medicine

## 2012-08-21 ENCOUNTER — Telehealth: Payer: Self-pay | Admitting: *Deleted

## 2012-08-21 DIAGNOSIS — I10 Essential (primary) hypertension: Secondary | ICD-10-CM

## 2012-08-21 DIAGNOSIS — E119 Type 2 diabetes mellitus without complications: Secondary | ICD-10-CM

## 2012-08-21 MED ORDER — GLUCOSE BLOOD VI STRP: ORAL_STRIP | Status: DC

## 2012-08-21 NOTE — Telephone Encounter (Signed)
Pt states  His eye doctor ( Dr. Allena Katz) is wanting him to have his A1C level check. Requesting to have labs done here. Also pt is needing strips on his verio one touch. [facility]form [facility] Once md approve order for A1C will call back to ket him know...Raechel Chute

## 2012-08-22 ENCOUNTER — Telehealth: Payer: Self-pay | Admitting: Internal Medicine

## 2012-08-22 NOTE — Telephone Encounter (Signed)
Notified pt md ok labs. Entered labs in epic...Raechel Chute

## 2012-08-22 NOTE — Telephone Encounter (Signed)
Pls ref strips x 12 mo OK A1c, BMET, CBC Thx

## 2012-08-22 NOTE — Telephone Encounter (Signed)
Caller: [name]/Patient; Patient Name: [name] [name]; PCP: Plotnikov;  Best Callback Phone Number: [phone] [name] has seen his optomolologist, Dr Allena Katz who says he needs blood work: BS and A1C. He wants to know if he can have this done at our office and if he can have a lab only visit or does he need to schedule an office visit?  Also needs new Rx for test strips for new glucometer, Verio One Touch. Uses [facility] [address]

## 2012-08-22 NOTE — Telephone Encounter (Signed)
See previous msg. Pt has been contacted concerning labs & renewal on strips...Raechel Chute

## 2012-08-26 ENCOUNTER — Other Ambulatory Visit (INDEPENDENT_AMBULATORY_CARE_PROVIDER_SITE_OTHER): Payer: PRIVATE HEALTH INSURANCE

## 2012-08-26 DIAGNOSIS — I10 Essential (primary) hypertension: Secondary | ICD-10-CM

## 2012-08-26 DIAGNOSIS — E119 Type 2 diabetes mellitus without complications: Secondary | ICD-10-CM

## 2012-08-26 LAB — CBC WITH DIFFERENTIAL/PLATELET
Basophils Absolute: 0.1 10*3/uL (ref 0.0–0.1)
Basophils Relative: 0.6 % (ref 0.0–3.0)
Eosinophils Absolute: 0.5 10*3/uL (ref 0.0–0.7)
Eosinophils Relative: 5 % (ref 0.0–5.0)
HCT: 48.9 % (ref 39.0–52.0)
Hemoglobin: 16.4 g/dL (ref 13.0–17.0)
Lymphocytes Relative: 18.3 % (ref 12.0–46.0)
Lymphs Abs: 1.7 10*3/uL (ref 0.7–4.0)
MCHC: 33.5 g/dL (ref 30.0–36.0)
MCV: 88.4 fl (ref 78.0–100.0)
Monocytes Absolute: 0.7 10*3/uL (ref 0.1–1.0)
Monocytes Relative: 7.2 % (ref 3.0–12.0)
Neutro Abs: 6.4 10*3/uL (ref 1.4–7.7)
Neutrophils Relative %: 68.9 % (ref 43.0–77.0)
Platelets: 173 10*3/uL (ref 150.0–400.0)
RBC: 5.54 Mil/uL (ref 4.22–5.81)
RDW: 13.8 % (ref 11.5–14.6)
WBC: 9.2 10*3/uL (ref 4.5–10.5)

## 2012-08-26 LAB — BASIC METABOLIC PANEL
BUN: 28 mg/dL — ABNORMAL HIGH (ref 6–23)
CO2: 25 mEq/L (ref 19–32)
Calcium: 9.4 mg/dL (ref 8.4–10.5)
Chloride: 106 mEq/L (ref 96–112)
Creatinine, Ser: 0.9 mg/dL (ref 0.4–1.5)
GFR: 97.05 mL/min (ref >60.00)
Glucose, Bld: 96 mg/dL (ref 70–99)
Potassium: 3.9 mEq/L (ref 3.5–5.1)
Sodium: 141 mEq/L (ref 135–145)

## 2012-08-26 LAB — HEMOGLOBIN A1C: Hgb A1c MFr Bld: 6.5 % (ref 4.6–6.5)

## 2012-09-02 ENCOUNTER — Ambulatory Visit (INDEPENDENT_AMBULATORY_CARE_PROVIDER_SITE_OTHER): Payer: PRIVATE HEALTH INSURANCE | Admitting: Internal Medicine

## 2012-09-02 ENCOUNTER — Encounter: Payer: Self-pay | Admitting: Internal Medicine

## 2012-09-02 VITALS — BP 150/100 | HR 68 | Temp 99.1°F | Wt 200.0 lb

## 2012-09-02 DIAGNOSIS — E278 Other specified disorders of adrenal gland: Secondary | ICD-10-CM

## 2012-09-02 DIAGNOSIS — N289 Disorder of kidney and ureter, unspecified: Secondary | ICD-10-CM

## 2012-09-02 DIAGNOSIS — N2889 Other specified disorders of kidney and ureter: Secondary | ICD-10-CM

## 2012-09-02 DIAGNOSIS — E279 Disorder of adrenal gland, unspecified: Secondary | ICD-10-CM

## 2012-09-02 DIAGNOSIS — E785 Hyperlipidemia, unspecified: Secondary | ICD-10-CM

## 2012-09-02 DIAGNOSIS — M1990 Unspecified osteoarthritis, unspecified site: Secondary | ICD-10-CM

## 2012-09-02 DIAGNOSIS — C649 Malignant neoplasm of unspecified kidney, except renal pelvis: Secondary | ICD-10-CM | POA: Insufficient documentation

## 2012-09-02 MED ORDER — IBUPROFEN 800 MG PO TABS: 800.0000 mg | ORAL_TABLET | Freq: Two times a day (BID) | ORAL | Status: DC | PRN

## 2012-09-02 NOTE — Assessment & Plan Note (Deleted)
10/13 R partial nephrctomy

## 2012-09-02 NOTE — Assessment & Plan Note (Signed)
Check BP at home Continue with current prescription therapy as reflected on the Med list.  

## 2012-09-02 NOTE — Assessment & Plan Note (Signed)
10/13 Dr Laverle Patter  S/p partial R nephrectomy

## 2012-09-02 NOTE — Assessment & Plan Note (Signed)
Continue with current prescription therapy as reflected on the Med list.  

## 2012-09-02 NOTE — Progress Notes (Signed)
   Subjective:    Patient ID: [name] [name], male    DOB: 1956/07/03, 56 y.o.   MRN: [id number]  HPI  F/u partial nephrectomy R; kidney stones -- s/p lithotripsy  The patient presents for a follow-up of  chronic hypertension, chronic dyslipidemia, type 2 diabetes controlled with medicines  He lost wt 2 lbs     Review of Systems  Constitutional: Negative for appetite change, fatigue and unexpected weight change.  HENT: Positive for hearing loss. Negative for ear pain, nosebleeds, congestion, sore throat, sneezing, trouble swallowing and neck pain.   Eyes: Negative for itching and visual disturbance.  Respiratory: Negative for cough.   Cardiovascular: Negative for chest pain, palpitations and leg swelling.  Gastrointestinal: Negative for nausea, diarrhea, blood in stool and abdominal distention.  Genitourinary: Negative for dysuria, urgency, frequency, hematuria and decreased urine volume.  Musculoskeletal: Negative for back pain, joint swelling and gait problem.  Skin: Negative for rash.  Neurological: Negative for dizziness, tremors, speech difficulty and weakness.  Psychiatric/Behavioral: Negative for sleep disturbance, dysphoric mood and agitation. The patient is not nervous/anxious.    Wt Readings from Last 3 Encounters:  09/02/12 200 lb (90.719 kg)  07/28/12 202 lb 13.2 oz (92 kg)  07/28/12 202 lb 13.2 oz (92 kg)   BP Readings from Last 3 Encounters:  09/02/12 150/100  07/30/12 154/78  07/30/12 154/78       Objective:   Physical Exam  Constitutional: He is oriented to person, place, and time. He appears well-developed.  HENT:  Mouth/Throat: Oropharynx is clear and moist.  Eyes: Conjunctivae normal are normal. Pupils are equal, round, and reactive to light.  Neck: Normal range of motion. No JVD present. No thyromegaly present.  Cardiovascular: Normal rate, regular rhythm, normal heart sounds and intact distal pulses.  Exam reveals no gallop and no friction rub.     No murmur heard. Pulmonary/Chest: Effort normal and breath sounds normal. No respiratory distress. He has no wheezes. He has no rales. He exhibits no tenderness.  Abdominal: Soft. Bowel sounds are normal. He exhibits no distension and no mass. There is no tenderness. There is no rebound and no guarding.  Musculoskeletal: Normal range of motion. He exhibits no edema and no tenderness.  Lymphadenopathy:    He has no cervical adenopathy.  Neurological: He is alert and oriented to person, place, and time. He has normal reflexes. No cranial nerve deficit. He exhibits normal muscle tone. Coordination normal.  Skin: Skin is warm and dry. No rash noted.  Psychiatric: He has a normal mood and affect. His behavior is normal. Judgment and thought content normal.  No wax B   Lab Results  Component Value Date   WBC 9.2 08/26/2012   HGB 16.4 08/26/2012   HCT 48.9 08/26/2012   PLT 173.0 08/26/2012   GLUCOSE 96 08/26/2012   CHOL 129 05/22/2010   TRIG 139.0 05/22/2010   HDL 26.10* 05/22/2010   LDLCALC 75 05/22/2010   ALT 35 01/08/2012   AST 19 01/08/2012   NA 141 08/26/2012   K 3.9 08/26/2012   CL 106 08/26/2012   CREATININE 0.9 08/26/2012   BUN 28* 08/26/2012   CO2 25 08/26/2012   TSH 0.89 11/05/2008   PSA 2.31 05/24/2011   INR 1.1* 01/08/2012   HGBA1C 6.5 08/26/2012         Assessment & Plan:

## 2012-09-02 NOTE — Assessment & Plan Note (Signed)
  On diet  

## 2012-09-04 ENCOUNTER — Ambulatory Visit (AMBULATORY_SURGERY_CENTER): Payer: PRIVATE HEALTH INSURANCE | Admitting: *Deleted

## 2012-09-04 ENCOUNTER — Telehealth: Payer: Self-pay | Admitting: *Deleted

## 2012-09-04 VITALS — Ht 66.0 in | Wt 193.0 lb

## 2012-09-04 DIAGNOSIS — Z1211 Encounter for screening for malignant neoplasm of colon: Secondary | ICD-10-CM

## 2012-09-04 MED ORDER — MOVIPREP 100 G PO SOLR: ORAL | Status: DC

## 2012-09-04 NOTE — Telephone Encounter (Signed)
[name] [name] was in for his previsit today and he had kidney surgery 07/28/12 and the anesthesiologist sent him a letter stating that he was a difficult intubation.  Advised the pt he will probably need to have his colon at [facility].  I completed his previsit and gave him instructions for his prep and told him we would contact him  If he's to have his colon at the hospital.

## 2012-09-04 NOTE — Progress Notes (Signed)
[name] [name] was in for his previsit today and he had kidney surgery 07/28/12 and the anesthesiologist sent him a letter stating that he was a difficult intubation.  Advised the pt he will probably need to have his colon at [facility].  I completed his previsit and gave him instructions for his prep and told him we would contact him  If he's to have his colon at the hospital.  

## 2012-09-05 NOTE — Telephone Encounter (Signed)
[name] [name] this patient needs to be scheduled in the hospital

## 2012-09-05 NOTE — Telephone Encounter (Signed)
[name] [name], schedule this patient at the hospital with propofol (make sure the hospital schedule her nose that I need anesthesia/propofol) during my next full hospital week (probably after the new year). When she has secured a time and date, let me know. Thanks

## 2012-09-05 NOTE — Telephone Encounter (Signed)
Please schedule [name] [name] at the hospital per Dr. Lamar Sprinkles order.  I notified the pt. And cancelled his procedure  at [facility].  He understands he'll be notified by Dr. Lamar Sprinkles nurse as to when his procedure will be done at [facility].  Wyona Almas

## 2012-09-07 ENCOUNTER — Encounter: Payer: Self-pay | Admitting: Internal Medicine

## 2012-09-15 ENCOUNTER — Ambulatory Visit (INDEPENDENT_AMBULATORY_CARE_PROVIDER_SITE_OTHER): Payer: PRIVATE HEALTH INSURANCE | Admitting: Internal Medicine

## 2012-09-15 ENCOUNTER — Encounter: Payer: Self-pay | Admitting: Internal Medicine

## 2012-09-15 VITALS — BP 140/80 | HR 80 | Temp 100.1°F | Resp 16 | Wt 200.0 lb

## 2012-09-15 DIAGNOSIS — J020 Streptococcal pharyngitis: Secondary | ICD-10-CM | POA: Insufficient documentation

## 2012-09-15 DIAGNOSIS — J029 Acute pharyngitis, unspecified: Secondary | ICD-10-CM

## 2012-09-15 DIAGNOSIS — J069 Acute upper respiratory infection, unspecified: Secondary | ICD-10-CM | POA: Insufficient documentation

## 2012-09-15 LAB — POCT RAPID STREP A (OFFICE): Rapid Strep A Screen: POSITIVE — AB

## 2012-09-15 MED ORDER — AZITHROMYCIN 250 MG PO TABS: ORAL_TABLET | ORAL | Status: DC

## 2012-09-15 NOTE — Progress Notes (Signed)
Patient ID: [name] [name], male   DOB: [birth year], 56 y.o.   MRN: [id number]   Subjective:    Patient ID: [name] [name], male    DOB: 08-02-1956, 56 y.o.   MRN: [id number]  Sore Throat  This is a new problem. The current episode started yesterday. The pain is at a severity of 8/10. The pain is severe. Pertinent negatives include no congestion, coughing, diarrhea, ear pain, neck pain or trouble swallowing.  Fever  This is a new problem. The current episode started yesterday. The maximum temperature noted was 99 to 99.9 F. Pertinent negatives include no chest pain, congestion, coughing, diarrhea, ear pain, nausea, rash or sore throat.       Review of Systems  Constitutional: Positive for fever. Negative for appetite change, fatigue and unexpected weight change.  HENT: Positive for hearing loss. Negative for ear pain, nosebleeds, congestion, sore throat, sneezing, trouble swallowing and neck pain.   Eyes: Negative for itching and visual disturbance.  Respiratory: Negative for cough.   Cardiovascular: Negative for chest pain, palpitations and leg swelling.  Gastrointestinal: Negative for nausea, diarrhea, blood in stool and abdominal distention.  Genitourinary: Negative for dysuria, urgency, frequency, hematuria and decreased urine volume.  Musculoskeletal: Negative for back pain, joint swelling and gait problem.  Skin: Negative for rash.  Neurological: Negative for dizziness, tremors, speech difficulty and weakness.  Psychiatric/Behavioral: Negative for sleep disturbance, dysphoric mood and agitation. The patient is not nervous/anxious.    Wt Readings from Last 3 Encounters:  09/15/12 200 lb (90.719 kg)  09/04/12 193 lb (87.544 kg)  09/02/12 200 lb (90.719 kg)   BP Readings from Last 3 Encounters:  09/15/12 140/80  09/02/12 150/100  07/30/12 154/78       Objective:   Physical Exam  Constitutional: He is oriented to person, place, and time. He appears well-developed.    HENT:  Mouth/Throat: Oropharynx is clear and moist.  Eyes: Conjunctivae normal are normal. Pupils are equal, round, and reactive to light.  Neck: Normal range of motion. No JVD present. No thyromegaly present.  Cardiovascular: Normal rate, regular rhythm, normal heart sounds and intact distal pulses.  Exam reveals no gallop and no friction rub.   No murmur heard. Pulmonary/Chest: Effort normal and breath sounds normal. No respiratory distress. He has no wheezes. He has no rales. He exhibits no tenderness.  Abdominal: Soft. Bowel sounds are normal. He exhibits no distension and no mass. There is no tenderness. There is no rebound and no guarding.  Musculoskeletal: Normal range of motion. He exhibits no edema and no tenderness.  Lymphadenopathy:    He has no cervical adenopathy.  Neurological: He is alert and oriented to person, place, and time. He has normal reflexes. No cranial nerve deficit. He exhibits normal muscle tone. Coordination normal.  Skin: Skin is warm and dry. No rash noted.  Psychiatric: He has a normal mood and affect. His behavior is normal. Judgment and thought content normal.  No wax B   Lab Results  Component Value Date   WBC 9.2 08/26/2012   HGB 16.4 08/26/2012   HCT 48.9 08/26/2012   PLT 173.0 08/26/2012   GLUCOSE 96 08/26/2012   CHOL 129 05/22/2010   TRIG 139.0 05/22/2010   HDL 26.10* 05/22/2010   LDLCALC 75 05/22/2010   ALT 35 01/08/2012   AST 19 01/08/2012   NA 141 08/26/2012   K 3.9 08/26/2012   CL 106 08/26/2012   CREATININE 0.9 08/26/2012   BUN 28* 08/26/2012  CO2 25 08/26/2012   TSH 0.89 11/05/2008   PSA 2.31 05/24/2011   INR 1.1* 01/08/2012   HGBA1C 6.5 08/26/2012         Assessment & Plan:

## 2012-09-15 NOTE — Assessment & Plan Note (Addendum)
Strep test Zpac 

## 2012-09-18 NOTE — Telephone Encounter (Signed)
[name] [name], I left message on your phone to be sure this gentleman's colon gets scheduled at [facility]/Dr. Cyndie Mull

## 2012-09-24 NOTE — Telephone Encounter (Signed)
Spoke with pts wife and let her know the next hospital week for Dr. Marina Goodell is the week of 11/24/12. Pt will call the office back regarding procedure.

## 2012-09-25 ENCOUNTER — Encounter: Payer: PRIVATE HEALTH INSURANCE | Admitting: Internal Medicine

## 2012-09-26 NOTE — Telephone Encounter (Signed)
Pts wife states they will call back about scheduling the procedure. He will not have met his deductible and they are going to have to look at some finances.

## 2012-12-31 ENCOUNTER — Telehealth: Payer: Self-pay | Admitting: Internal Medicine

## 2012-12-31 NOTE — Telephone Encounter (Signed)
Pt was scheduled for Colon in November in the [facility] Pt had surgery previously and had paperwork with him that stated he was a "difficult intubation." Pt needed colon done at hospital due to this. Pt did not schedule at the hospital in November. Pt is calling wanting to schedule his Colon at the hospital now. Let pt know this would be done one of the weeks when Dr. Marina Goodell is at the hospital and that I would have to call him back. Dr. Marina Goodell when would you like this pt scheduled at [facility]? Please advise.

## 2013-01-02 ENCOUNTER — Ambulatory Visit: Payer: PRIVATE HEALTH INSURANCE | Admitting: Internal Medicine

## 2013-01-04 NOTE — Telephone Encounter (Signed)
April or May hospital week with propofol. Thanks

## 2013-01-07 ENCOUNTER — Other Ambulatory Visit: Payer: Self-pay | Admitting: Internal Medicine

## 2013-01-07 DIAGNOSIS — Z1211 Encounter for screening for malignant neoplasm of colon: Secondary | ICD-10-CM

## 2013-01-07 NOTE — Telephone Encounter (Signed)
Pt scheduled for previsit 02/20/13@4 :30pm. Pt scheduled for colon at [facility] 03/03/13. Pt to arrive at 8:15am for a 9:15am appt. Pt aware of appt date and time.

## 2013-01-08 NOTE — Telephone Encounter (Signed)
Left message for pt to call back  °

## 2013-01-09 ENCOUNTER — Ambulatory Visit: Payer: PRIVATE HEALTH INSURANCE | Admitting: Internal Medicine

## 2013-01-09 NOTE — Telephone Encounter (Signed)
Pt aware of appt dates and times. 

## 2013-01-12 ENCOUNTER — Other Ambulatory Visit (INDEPENDENT_AMBULATORY_CARE_PROVIDER_SITE_OTHER): Payer: PRIVATE HEALTH INSURANCE

## 2013-01-12 DIAGNOSIS — E279 Disorder of adrenal gland, unspecified: Secondary | ICD-10-CM

## 2013-01-12 DIAGNOSIS — M1990 Unspecified osteoarthritis, unspecified site: Secondary | ICD-10-CM

## 2013-01-12 DIAGNOSIS — E278 Other specified disorders of adrenal gland: Secondary | ICD-10-CM

## 2013-01-12 DIAGNOSIS — E785 Hyperlipidemia, unspecified: Secondary | ICD-10-CM

## 2013-01-12 LAB — BASIC METABOLIC PANEL
BUN: 16 mg/dL (ref 6–23)
CO2: 26 mEq/L (ref 19–32)
Calcium: 8.8 mg/dL (ref 8.4–10.5)
Chloride: 105 mEq/L (ref 96–112)
Creatinine, Ser: 0.8 mg/dL (ref 0.4–1.5)
GFR: 100.97 mL/min (ref >60.00)
Glucose, Bld: 155 mg/dL — ABNORMAL HIGH (ref 70–99)
Potassium: 3.4 mEq/L — ABNORMAL LOW (ref 3.5–5.1)
Sodium: 139 mEq/L (ref 135–145)

## 2013-01-12 LAB — CBC WITH DIFFERENTIAL/PLATELET
Basophils Absolute: 0 10*3/uL (ref 0.0–0.1)
Basophils Relative: 0.5 % (ref 0.0–3.0)
Eosinophils Absolute: 0.3 10*3/uL (ref 0.0–0.7)
Eosinophils Relative: 3.4 % (ref 0.0–5.0)
HCT: 47.1 % (ref 39.0–52.0)
Hemoglobin: 16.2 g/dL (ref 13.0–17.0)
Lymphocytes Relative: 16.8 % (ref 12.0–46.0)
Lymphs Abs: 1.3 10*3/uL (ref 0.7–4.0)
MCHC: 34.4 g/dL (ref 30.0–36.0)
MCV: 85.9 fl (ref 78.0–100.0)
Monocytes Absolute: 0.5 10*3/uL (ref 0.1–1.0)
Monocytes Relative: 7 % (ref 3.0–12.0)
Neutro Abs: 5.5 10*3/uL (ref 1.4–7.7)
Neutrophils Relative %: 72.3 % (ref 43.0–77.0)
Platelets: 165 10*3/uL (ref 150.0–400.0)
RBC: 5.48 Mil/uL (ref 4.22–5.81)
RDW: 14.3 % (ref 11.5–14.6)
WBC: 7.6 10*3/uL (ref 4.5–10.5)

## 2013-01-12 LAB — HEMOGLOBIN A1C: Hgb A1c MFr Bld: 6.9 % — ABNORMAL HIGH (ref 4.6–6.5)

## 2013-01-12 LAB — TSH: TSH: 0.89 u[IU]/mL (ref 0.35–5.50)

## 2013-01-14 ENCOUNTER — Ambulatory Visit (INDEPENDENT_AMBULATORY_CARE_PROVIDER_SITE_OTHER): Payer: PRIVATE HEALTH INSURANCE | Admitting: Internal Medicine

## 2013-01-14 ENCOUNTER — Encounter: Payer: Self-pay | Admitting: Internal Medicine

## 2013-01-14 VITALS — BP 160/92 | HR 76 | Temp 98.5°F | Resp 16 | Wt 206.0 lb

## 2013-01-14 DIAGNOSIS — E785 Hyperlipidemia, unspecified: Secondary | ICD-10-CM

## 2013-01-14 DIAGNOSIS — I10 Essential (primary) hypertension: Secondary | ICD-10-CM

## 2013-01-14 DIAGNOSIS — E119 Type 2 diabetes mellitus without complications: Secondary | ICD-10-CM

## 2013-01-14 NOTE — Progress Notes (Signed)
Patient ID: [name] [name], male   DOB: 07/11/1956, 57 y.o.   MRN: [id number]   Subjective:    Patient ID: [name] [name], male    DOB: 1956-01-04, 57 y.o.   MRN: [id number]  HPI  F/u partial nephrectomy R; kidney stones -- s/p lithotripsy  The patient presents for a follow-up of  chronic hypertension, chronic dyslipidemia, type 2 diabetes controlled with medicines      Review of Systems  Constitutional: Negative for appetite change, fatigue and unexpected weight change.  HENT: Positive for hearing loss. Negative for ear pain, nosebleeds, congestion, sore throat, sneezing, trouble swallowing and neck pain.   Eyes: Negative for itching and visual disturbance.  Respiratory: Negative for cough.   Cardiovascular: Negative for chest pain, palpitations and leg swelling.  Gastrointestinal: Negative for nausea, diarrhea, blood in stool and abdominal distention.  Genitourinary: Negative for dysuria, urgency, frequency, hematuria and decreased urine volume.  Musculoskeletal: Negative for back pain, joint swelling and gait problem.  Skin: Negative for rash.  Neurological: Negative for dizziness, tremors, speech difficulty and weakness.  Psychiatric/Behavioral: Negative for sleep disturbance, dysphoric mood and agitation. The patient is not nervous/anxious.    Wt Readings from Last 3 Encounters:  01/14/13 206 lb (93.441 kg)  09/15/12 200 lb (90.719 kg)  09/04/12 193 lb (87.544 kg)   BP Readings from Last 3 Encounters:  01/14/13 160/92  09/15/12 140/80  09/02/12 150/100       Objective:   Physical Exam  Constitutional: He is oriented to person, place, and time. He appears well-developed.  HENT:  Mouth/Throat: Oropharynx is clear and moist.  Eyes: Conjunctivae are normal. Pupils are equal, round, and reactive to light.  Neck: Normal range of motion. No JVD present. No thyromegaly present.  Cardiovascular: Normal rate, regular rhythm, normal heart sounds and intact distal pulses.   Exam reveals no gallop and no friction rub.   No murmur heard. Pulmonary/Chest: Effort normal and breath sounds normal. No respiratory distress. He has no wheezes. He has no rales. He exhibits no tenderness.  Abdominal: Soft. Bowel sounds are normal. He exhibits no distension and no mass. There is no tenderness. There is no rebound and no guarding.  Musculoskeletal: Normal range of motion. He exhibits no edema and no tenderness.  Lymphadenopathy:    He has no cervical adenopathy.  Neurological: He is alert and oriented to person, place, and time. He has normal reflexes. No cranial nerve deficit. He exhibits normal muscle tone. Coordination normal.  Skin: Skin is warm and dry. No rash noted.  Psychiatric: He has a normal mood and affect. His behavior is normal. Judgment and thought content normal.  No wax B   Lab Results  Component Value Date   WBC 7.6 01/12/2013   HGB 16.2 01/12/2013   HCT 47.1 01/12/2013   PLT 165.0 01/12/2013   GLUCOSE 155* 01/12/2013   CHOL 129 05/22/2010   TRIG 139.0 05/22/2010   HDL 26.10* 05/22/2010   LDLCALC 75 05/22/2010   ALT 35 01/08/2012   AST 19 01/08/2012   NA 139 01/12/2013   K 3.4* 01/12/2013   CL 105 01/12/2013   CREATININE 0.8 01/12/2013   BUN 16 01/12/2013   CO2 26 01/12/2013   TSH 0.89 01/12/2013   PSA 2.31 05/24/2011   INR 1.1* 01/08/2012   HGBA1C 6.9* 01/12/2013         Assessment & Plan:

## 2013-01-14 NOTE — Assessment & Plan Note (Signed)
Worse Continue with current prescription therapy as reflected on the Med list. Loose wt

## 2013-01-14 NOTE — Assessment & Plan Note (Signed)
Worse 4/14. Loose wt, NAS diet

## 2013-01-14 NOTE — Assessment & Plan Note (Signed)
  On diet  

## 2013-02-09 ENCOUNTER — Encounter (HOSPITAL_COMMUNITY): Payer: Self-pay | Admitting: *Deleted

## 2013-02-24 ENCOUNTER — Ambulatory Visit (AMBULATORY_SURGERY_CENTER): Payer: PRIVATE HEALTH INSURANCE

## 2013-02-24 ENCOUNTER — Encounter (HOSPITAL_COMMUNITY): Payer: Self-pay | Admitting: Pharmacy Technician

## 2013-02-24 VITALS — Ht 66.0 in | Wt 205.8 lb

## 2013-02-24 DIAGNOSIS — Z800 Family history of malignant neoplasm of digestive organs: Secondary | ICD-10-CM

## 2013-02-24 DIAGNOSIS — Z1211 Encounter for screening for malignant neoplasm of colon: Secondary | ICD-10-CM

## 2013-02-24 MED ORDER — MOVIPREP 100 G PO SOLR: ORAL | Status: DC

## 2013-03-03 ENCOUNTER — Ambulatory Visit (HOSPITAL_COMMUNITY)
Admission: RE | Admit: 2013-03-03 | Discharge: 2013-03-03 | Disposition: A | Discharge status: Home / Self Care | Payer: PRIVATE HEALTH INSURANCE | Source: Ambulatory Visit | Attending: Internal Medicine | Admitting: Internal Medicine

## 2013-03-03 ENCOUNTER — Ambulatory Visit (HOSPITAL_COMMUNITY): Payer: PRIVATE HEALTH INSURANCE | Admitting: Anesthesiology

## 2013-03-03 ENCOUNTER — Encounter (HOSPITAL_COMMUNITY): Payer: Self-pay | Admitting: *Deleted

## 2013-03-03 ENCOUNTER — Encounter (HOSPITAL_COMMUNITY): Payer: Self-pay | Admitting: Anesthesiology

## 2013-03-03 ENCOUNTER — Encounter (HOSPITAL_COMMUNITY)
Admission: RE | Disposition: A | Discharge status: Home / Self Care | Payer: Self-pay | Source: Ambulatory Visit | Attending: Internal Medicine

## 2013-03-03 DIAGNOSIS — E785 Hyperlipidemia, unspecified: Secondary | ICD-10-CM | POA: Insufficient documentation

## 2013-03-03 DIAGNOSIS — Z800 Family history of malignant neoplasm of digestive organs: Secondary | ICD-10-CM | POA: Insufficient documentation

## 2013-03-03 DIAGNOSIS — E119 Type 2 diabetes mellitus without complications: Secondary | ICD-10-CM | POA: Insufficient documentation

## 2013-03-03 DIAGNOSIS — Z79899 Other long term (current) drug therapy: Secondary | ICD-10-CM | POA: Insufficient documentation

## 2013-03-03 DIAGNOSIS — Z860100 Personal history of colon polyps, unspecified: Secondary | ICD-10-CM | POA: Insufficient documentation

## 2013-03-03 DIAGNOSIS — D126 Benign neoplasm of colon, unspecified: Secondary | ICD-10-CM | POA: Insufficient documentation

## 2013-03-03 DIAGNOSIS — I10 Essential (primary) hypertension: Secondary | ICD-10-CM | POA: Insufficient documentation

## 2013-03-03 DIAGNOSIS — K5730 Diverticulosis of large intestine without perforation or abscess without bleeding: Secondary | ICD-10-CM | POA: Insufficient documentation

## 2013-03-03 DIAGNOSIS — Z09 Encounter for follow-up examination after completed treatment for conditions other than malignant neoplasm: Secondary | ICD-10-CM | POA: Insufficient documentation

## 2013-03-03 DIAGNOSIS — K648 Other hemorrhoids: Secondary | ICD-10-CM | POA: Insufficient documentation

## 2013-03-03 DIAGNOSIS — Z1211 Encounter for screening for malignant neoplasm of colon: Secondary | ICD-10-CM

## 2013-03-03 DIAGNOSIS — F17200 Nicotine dependence, unspecified, uncomplicated: Secondary | ICD-10-CM | POA: Insufficient documentation

## 2013-03-03 HISTORY — PX: COLONOSCOPY: SHX5424

## 2013-03-03 LAB — BASIC METABOLIC PANEL
BUN: 12 mg/dL (ref 6–23)
CO2: 24 mEq/L (ref 19–32)
Calcium: 9 mg/dL (ref 8.4–10.5)
Chloride: 106 mEq/L (ref 96–112)
Creatinine, Ser: 0.62 mg/dL (ref 0.50–1.35)
GFR calc Af Amer: >90 mL/min (ref >90)
GFR calc non Af Amer: >90 mL/min (ref >90)
Glucose, Bld: 127 mg/dL — ABNORMAL HIGH (ref 70–99)
Potassium: 3.3 mEq/L — ABNORMAL LOW (ref 3.5–5.1)
Sodium: 142 mEq/L (ref 135–145)

## 2013-03-03 LAB — GLUCOSE, CAPILLARY: Glucose-Capillary: 116 mg/dL — ABNORMAL HIGH (ref 70–99)

## 2013-03-03 SURGERY — COLONOSCOPY
Anesthesia: Monitor Anesthesia Care

## 2013-03-03 MED ORDER — KETAMINE HCL 10 MG/ML IJ SOLN
INTRAMUSCULAR | Status: DC | PRN
  Administered 2013-03-03: 20 mg via INTRAVENOUS

## 2013-03-03 MED ORDER — SODIUM CHLORIDE 0.9 % IV SOLN: INTRAVENOUS | Status: DC

## 2013-03-03 MED ORDER — PROPOFOL 10 MG/ML IV EMUL
INTRAVENOUS | Status: DC | PRN
  Administered 2013-03-03: 40 mg via INTRAVENOUS

## 2013-03-03 MED ORDER — LACTATED RINGERS IV SOLN
INTRAVENOUS | Status: DC
  Administered 2013-03-03: 1000 mL via INTRAVENOUS

## 2013-03-03 MED ORDER — PROPOFOL INFUSION 10 MG/ML OPTIME
INTRAVENOUS | Status: DC | PRN
  Administered 2013-03-03: 140 ug/kg/min via INTRAVENOUS

## 2013-03-03 MED ORDER — LACTATED RINGERS IV SOLN
INTRAVENOUS | Status: DC | PRN
  Administered 2013-03-03: 09:00:00 via INTRAVENOUS

## 2013-03-03 NOTE — Anesthesia Preprocedure Evaluation (Addendum)
Anesthesia Evaluation  Patient identified by MRN, date of birth, ID band Patient awake    Reviewed: Allergy & Precautions, H&P , NPO status , Patient's Chart, lab work & pertinent test results  History of Anesthesia Complications (+) DIFFICULT AIRWAY  Airway Mallampati: II TM Distance: >3 FB Neck ROM: Full    Dental no notable dental hx.    Pulmonary Current Smoker,  breath sounds clear to auscultation  Pulmonary exam normal       Cardiovascular Exercise Tolerance: Good hypertension, Pt. on medications Rhythm:Regular Rate:Normal     Neuro/Psych  Headaches, negative psych ROS   GI/Hepatic negative GI ROS, (+)     substance abuse  marijuana use,   Endo/Other  diabetes, Type 2, Oral Hypoglycemic Agents  Renal/GU Renal disease  negative genitourinary   Musculoskeletal negative musculoskeletal ROS (+)   Abdominal   Peds negative pediatric ROS (+)  Hematology negative hematology ROS (+)   Anesthesia Other Findings   Reproductive/Obstetrics negative OB ROS                           Anesthesia Physical Anesthesia Plan  ASA: III  Anesthesia Plan: MAC   Post-op Pain Management:    Induction: Intravenous  Airway Management Planned:   Additional Equipment:   Intra-op Plan:   Post-operative Plan:   Informed Consent: I have reviewed the patients History and Physical, chart, labs and discussed the procedure including the risks, benefits and alternatives for the proposed anesthesia with the patient or authorized representative who has indicated his/her understanding and acceptance.   Dental advisory given  Plan Discussed with: CRNA  Anesthesia Plan Comments:         Anesthesia Quick Evaluation

## 2013-03-03 NOTE — Transfer of Care (Signed)
Immediate Anesthesia Transfer of Care Note  Patient: [name] [name]  Procedure(s) Performed: Procedure(s): COLONOSCOPY (N/A)  Patient Location: PACU  Anesthesia Type:MAC  Level of Consciousness: awake, alert  and oriented  Airway & Oxygen Therapy: Patient Spontanous Breathing and Patient connected to face mask oxygen  Post-op Assessment: Report given to PACU RN and Post -op Vital signs reviewed and stable  Post vital signs: Reviewed and stable  Complications: No apparent anesthesia complications

## 2013-03-03 NOTE — Preoperative (Signed)
Beta Blockers   Reason not to administer Beta Blockers:Not Applicable 

## 2013-03-03 NOTE — Anesthesia Postprocedure Evaluation (Signed)
  Anesthesia Post-op Note  Patient: [name] [name]  Procedure(s) Performed: Procedure(s) (LRB): COLONOSCOPY (N/A)  Patient Location: PACU  Anesthesia Type: MAC  Level of Consciousness: awake and alert   Airway and Oxygen Therapy: Patient Spontanous Breathing  Post-op Pain: mild  Post-op Assessment: Post-op Vital signs reviewed, Patient's Cardiovascular Status Stable, Respiratory Function Stable, Patent Airway and No signs of Nausea or vomiting  Last Vitals:  Filed Vitals:   03/03/13 1000  BP: 150/94  Temp:   Resp: 7    Post-op Vital Signs: stable   Complications: No apparent anesthesia complications

## 2013-03-03 NOTE — Discharge Instructions (Signed)
Colonoscopy °Care After °Read the instructions outlined below and refer to this sheet in the next few weeks. These discharge instructions provide you with general information on caring for yourself after you leave the hospital. Your doctor may also give you specific instructions. While your treatment has been planned according to the most current medical practices available, unavoidable complications occasionally occur. If you have any problems or questions after discharge, call your doctor. °HOME CARE INSTRUCTIONS °ACTIVITY: °· You may resume your regular activity, but move at a slower pace for the next 24 hours. °· Take frequent rest periods for the next 24 hours. °· Walking will help get rid of the air and reduce the bloated feeling in your belly (abdomen). °· No driving for 24 hours (because of the medicine (anesthesia) used during the test). °· You may shower. °· Do not sign any important legal documents or operate any machinery for 24 hours (because of the anesthesia used during the test). °NUTRITION: °· Drink plenty of fluids. °· You may resume your normal diet as instructed by your doctor. °· Begin with a light meal and progress to your normal diet. Heavy or fried foods are harder to digest and may make you feel sick to your stomach (nauseated). °· Avoid alcoholic beverages for 24 hours or as instructed. °MEDICATIONS: °· You may resume your normal medications unless your doctor tells you otherwise. °WHAT TO EXPECT TODAY: °· Some feelings of bloating in the abdomen. °· Passage of more gas than usual. °· Spotting of blood in your stool or on the toilet paper. °IF YOU HAD POLYPS REMOVED DURING THE COLONOSCOPY: °· No aspirin products for 7 days or as instructed. °· No alcohol for 7 days or as instructed. °· Eat a soft diet for the next 24 hours. °FINDING OUT THE RESULTS OF YOUR TEST °Not all test results are available during your visit. If your test results are not back during the visit, make an appointment with  your caregiver to find out the results. Do not assume everything is normal if you have not heard from your caregiver or the medical facility. It is important for you to follow up on all of your test results.  °SEEK IMMEDIATE MEDICAL CARE IF: °· You have more than a spotting of blood in your stool. °· Your belly is swollen (abdominal distention). °· You are nauseated or vomiting. °· You have a fever. °· You have abdominal pain or discomfort that is severe or gets worse throughout the day. °Document Released: 05/15/2004 Document Revised: 12/24/2011 Document Reviewed: 05/13/2008 °ExitCare® Patient Information ©2013 [employer]. ° °

## 2013-03-03 NOTE — Op Note (Signed)
[facility] [address] [place], [address]   COLONOSCOPY PROCEDURE REPORT  PATIENT: [name], [name]  MR#: [id number] BIRTHDATE: [birth year] , 56  yrs. old GENDER: Male ENDOSCOPIST: Roxy Cedar, MD REFERRED [id number] Program Recall PROCEDURE DATE:  03/03/2013 PROCEDURE:   Colonoscopy with snare polypectomy    x 2 ASA CLASS:   Class II INDICATIONS:Patient's personal history of adenomatous colon polyps. Index colonoscopy 2007 with tubular adenomas and hyperplastic polyp. Large serrated lesion biopsied. Followup in 2008 with resection of large lesion and others all hyperplastic. MEDICATIONS: MAC sedation, administered by CRNA and See Anesthesia Report.  DESCRIPTION OF PROCEDURE:   After the risks benefits and alternatives of the procedure were thoroughly explained, informed consent was obtained.  A digital rectal exam revealed no abnormalities of the rectum.   The Pentax Adult Colon [id number] endoscope was introduced through the anus and advanced to the cecum, which was identified by both the appendix and ileocecal valve. No adverse events experienced.   The quality of the prep was excellent, using MoviPrep  The instrument was then slowly withdrawn as the colon was fully examined.      COLON FINDINGS: Two polyps ranging between 3-28mm in size were found in the transverse colon and sigmoid colon.  A polypectomy was performed with a cold snare.  The resection was complete and the polyp tissue was completely retrieved.   Moderate diverticulosis was noted in the transverse colon, at the splenic flexure, and The finding was in the left colon.   The colon mucosa was otherwise normal.  Retroflexed views revealed internal hemorrhoids. The time to cecum=2 minutes 0 seconds.  Withdrawal time=15 minutes 0 seconds.  The scope was withdrawn and the procedure completed. COMPLICATIONS: There were no complications.  ENDOSCOPIC IMPRESSION: 1.   Two polyps ranging  between 3-73mm in size were found in the transverse colon and sigmoid colon; polypectomy was performed with a cold snare 2.   Moderate diverticulosis was noted in the transverse colon, splenic flexure, and  left colon 3.   The colon mucosa was otherwise normal  RECOMMENDATIONS: 1. Repeat colonoscopy in 5 years if polyp adenomatous; otherwise 10 years   eSigned:  Roxy Cedar, MD 03/03/2013 9:38 AM cc: Linda Hedges.  Plotnikov, MD and The Patient   PATIENT NAME:  [name] [name], [name] [name] MR#: [id number]

## 2013-03-03 NOTE — H&P (Signed)
  HISTORY OF PRESENT ILLNESS:  [name] [name] is a 57 y.o. male with multiple medical problems who presents today for surveillance colonoscopy. Initial colonoscopy in 2007 revealed tubular adenomas and hyperplastic polyp. Colonoscopy in 2008 revealed hyperplastic polyps only. The largest polyp was removed piecemeal and measured approximately 20 mm. Previously this was said to have serrated architecture. However, the larger specimen was said to show hyperplastic change only. Patient's chronic medical problems are stable.   REVIEW OF SYSTEMS:  All non-GI ROS negative  Past Medical History  Diagnosis Date  . Hyperlipidemia   . Hypertension   . Elevated PSA   . Elevated glucose   . Osteoarthritis   . Diabetes mellitus type II   . Herniated cervical disc     no current pain  . History of kidney stones   . Substance abuse   . Renal neoplasm     RT  . Cancer 07/28/2012    kidney  . Difficult intubation 07/28/2012    hard to place breathing tube    Past Surgical History  Procedure Laterality Date  . Tonsillectomy  1965  . Kidney surgery Right 07/28/12    Had 10% removed for cancer     Social History [name] [name]  reports that he has been smoking Cigarettes.  He has a 15 pack-year smoking history. He has never used smokeless tobacco. He reports that he uses illicit drugs (Marijuana) about 4 times per week. He reports that he does not drink alcohol.  family history includes Colon cancer in his paternal aunt; Colon cancer (age of onset: 48) in his mother; and Lymphoma in his mother.  Allergies  Allergen Reactions  . Codeine Sulfate Itching  . Tamsulosin Other (See Comments)    Unable to perform sexually       PHYSICAL EXAMINATION: Vital signs: BP 133/78  Temp(Src) 98 F (36.7 C) (Oral)  Resp 13  SpO2 96%  Constitutional: generally well-appearing, no acute distress Psychiatric: alert and oriented x3, cooperative Eyes: extraocular movements intact, anicteric,  conjunctiva pink Mouth: oral pharynx moist, no lesions Neck: supple no lymphadenopathy Cardiovascular: heart regular rate and rhythm, no murmur Lungs: clear to auscultation bilaterally Abdomen: soft, nontender, nondistended, no obvious ascites, no peritoneal signs, normal bowel sounds, no organomegaly Rectal:see colonoscopy report Extremities: no lower extremity edema bilaterally Skin: no lesions on visible extremities Neuro: No focal deficits.  ASSESSMENT:  #1. History of adenomatous and hyperplastic polyps. Due for surveillance #2. Multiple medical problems. Stable   PLAN:  #1. Surveillance colonoscopy.The nature of the procedure, as well as the risks, benefits, and alternatives were carefully and thoroughly reviewed with the patient. Ample time for discussion and questions allowed. The patient understood, was satisfied, and agreed to proceed.  [name] [name]. Eda Keys., M.D. [facility] Division of Gastroenterology

## 2013-03-04 ENCOUNTER — Encounter (HOSPITAL_COMMUNITY): Payer: Self-pay | Admitting: Internal Medicine

## 2013-03-04 ENCOUNTER — Encounter: Payer: Self-pay | Admitting: Internal Medicine

## 2013-03-26 ENCOUNTER — Other Ambulatory Visit: Payer: Self-pay

## 2013-03-26 MED ORDER — GLUCOSE BLOOD VI STRP: ORAL_STRIP | Status: DC

## 2013-03-26 NOTE — Telephone Encounter (Signed)
Patient called requesting rx for onetouch verio strips.

## 2013-04-20 ENCOUNTER — Other Ambulatory Visit: Payer: Self-pay | Admitting: Internal Medicine

## 2013-05-18 ENCOUNTER — Other Ambulatory Visit (INDEPENDENT_AMBULATORY_CARE_PROVIDER_SITE_OTHER): Payer: PRIVATE HEALTH INSURANCE

## 2013-05-18 DIAGNOSIS — E785 Hyperlipidemia, unspecified: Secondary | ICD-10-CM

## 2013-05-18 DIAGNOSIS — E119 Type 2 diabetes mellitus without complications: Secondary | ICD-10-CM

## 2013-05-18 DIAGNOSIS — I10 Essential (primary) hypertension: Secondary | ICD-10-CM

## 2013-05-18 LAB — HEPATIC FUNCTION PANEL
ALT: 21 U/L (ref 0–53)
AST: 14 U/L (ref 0–37)
Albumin: 3.9 g/dL (ref 3.5–5.2)
Alkaline Phosphatase: 81 U/L (ref 39–117)
Bilirubin, Direct: 0.1 mg/dL (ref 0.0–0.3)
Total Bilirubin: 0.5 mg/dL (ref 0.3–1.2)
Total Protein: 6.8 g/dL (ref 6.0–8.3)

## 2013-05-18 LAB — BASIC METABOLIC PANEL
BUN: 23 mg/dL (ref 6–23)
CO2: 28 mEq/L (ref 19–32)
Calcium: 9.2 mg/dL (ref 8.4–10.5)
Chloride: 108 mEq/L (ref 96–112)
Creatinine, Ser: 0.8 mg/dL (ref 0.4–1.5)
GFR: 105.23 mL/min (ref >60.00)
Glucose, Bld: 103 mg/dL — ABNORMAL HIGH (ref 70–99)
Potassium: 3.5 mEq/L (ref 3.5–5.1)
Sodium: 143 mEq/L (ref 135–145)

## 2013-05-18 LAB — LIPID PANEL
Cholesterol: 122 mg/dL (ref 0–200)
HDL: 25.8 mg/dL — ABNORMAL LOW (ref >39.00)
LDL Cholesterol: 76 mg/dL (ref 0–99)
Total CHOL/HDL Ratio: 5
Triglycerides: 103 mg/dL (ref 0.0–149.0)
VLDL: 20.6 mg/dL (ref 0.0–40.0)

## 2013-05-18 LAB — HEMOGLOBIN A1C: Hgb A1c MFr Bld: 6.4 % (ref 4.6–6.5)

## 2013-05-19 ENCOUNTER — Ambulatory Visit (INDEPENDENT_AMBULATORY_CARE_PROVIDER_SITE_OTHER): Payer: PRIVATE HEALTH INSURANCE | Admitting: Internal Medicine

## 2013-05-19 ENCOUNTER — Encounter: Payer: Self-pay | Admitting: Internal Medicine

## 2013-05-19 ENCOUNTER — Telehealth: Payer: Self-pay | Admitting: Internal Medicine

## 2013-05-19 VITALS — BP 140/80 | HR 80 | Temp 98.3°F | Resp 16 | Wt 198.0 lb

## 2013-05-19 DIAGNOSIS — E119 Type 2 diabetes mellitus without complications: Secondary | ICD-10-CM

## 2013-05-19 DIAGNOSIS — M545 Low back pain, unspecified: Secondary | ICD-10-CM

## 2013-05-19 DIAGNOSIS — I10 Essential (primary) hypertension: Secondary | ICD-10-CM

## 2013-05-19 DIAGNOSIS — E785 Hyperlipidemia, unspecified: Secondary | ICD-10-CM

## 2013-05-19 MED ORDER — VITAMIN D 1000 UNITS PO TABS: 1000.0000 [IU] | ORAL_TABLET | Freq: Every day | ORAL | Status: AC

## 2013-05-19 NOTE — Assessment & Plan Note (Signed)
Continue with current prescription therapy as reflected on the Med list. BP Readings from Last 3 Encounters:  05/19/13 140/80  03/03/13 150/94  03/03/13 150/94

## 2013-05-19 NOTE — Assessment & Plan Note (Signed)
  On diet  

## 2013-05-19 NOTE — Telephone Encounter (Signed)
Pt request lab work prior to this appt in 09/21/13. Please call pt if this is ok.

## 2013-05-19 NOTE — Assessment & Plan Note (Signed)
Better  

## 2013-05-19 NOTE — Assessment & Plan Note (Signed)
Continue with current prescription therapy as reflected on the Med list.  

## 2013-05-19 NOTE — Progress Notes (Signed)
   Subjective:   HPI  F/u partial nephrectomy R; kidney stones -- s/p lithotripsy  The patient presents for a follow-up of  chronic hypertension, chronic dyslipidemia, type 2 diabetes controlled with medicines  Wt Readings from Last 3 Encounters:  05/19/13 198 lb (89.812 kg)  02/24/13 205 lb 12.8 oz (93.35 kg)  01/14/13 206 lb (93.441 kg)   BP Readings from Last 3 Encounters:  05/19/13 140/80  03/03/13 150/94  03/03/13 150/94        Review of Systems  Constitutional: Negative for appetite change, fatigue and unexpected weight change.  HENT: Positive for hearing loss. Negative for ear pain, nosebleeds, congestion, sore throat, sneezing, trouble swallowing and neck pain.   Eyes: Negative for itching and visual disturbance.  Respiratory: Negative for cough.   Cardiovascular: Negative for chest pain, palpitations and leg swelling.  Gastrointestinal: Negative for nausea, diarrhea, blood in stool and abdominal distention.  Genitourinary: Negative for dysuria, urgency, frequency, hematuria and decreased urine volume.  Musculoskeletal: Negative for back pain, joint swelling and gait problem.  Skin: Negative for rash.  Neurological: Negative for dizziness, tremors, speech difficulty and weakness.  Psychiatric/Behavioral: Negative for sleep disturbance, dysphoric mood and agitation. The patient is not nervous/anxious.          Objective:   Physical Exam  Constitutional: He is oriented to person, place, and time. He appears well-developed.  HENT:  Mouth/Throat: Oropharynx is clear and moist.  Eyes: Conjunctivae are normal. Pupils are equal, round, and reactive to light.  Neck: Normal range of motion. No JVD present. No thyromegaly present.  Cardiovascular: Normal rate, regular rhythm, normal heart sounds and intact distal pulses.  Exam reveals no gallop and no friction rub.   No murmur heard. Pulmonary/Chest: Effort normal and breath sounds normal. No respiratory distress. He  has no wheezes. He has no rales. He exhibits no tenderness.  Abdominal: Soft. Bowel sounds are normal. He exhibits no distension and no mass. There is no tenderness. There is no rebound and no guarding.  Musculoskeletal: Normal range of motion. He exhibits no edema and no tenderness.  Lymphadenopathy:    He has no cervical adenopathy.  Neurological: He is alert and oriented to person, place, and time. He has normal reflexes. No cranial nerve deficit. He exhibits normal muscle tone. Coordination normal.  Skin: Skin is warm and dry. No rash noted.  Psychiatric: He has a normal mood and affect. His behavior is normal. Judgment and thought content normal.  No wax B   Lab Results  Component Value Date   WBC 7.6 01/12/2013   HGB 16.2 01/12/2013   HCT 47.1 01/12/2013   PLT 165.0 01/12/2013   GLUCOSE 103* 05/18/2013   CHOL 122 05/18/2013   TRIG 103.0 05/18/2013   HDL 25.80* 05/18/2013   LDLCALC 76 05/18/2013   ALT 21 05/18/2013   AST 14 05/18/2013   NA 143 05/18/2013   K 3.5 05/18/2013   CL 108 05/18/2013   CREATININE 0.8 05/18/2013   BUN 23 05/18/2013   CO2 28 05/18/2013   TSH 0.89 01/12/2013   PSA 2.31 05/24/2011   INR 1.1* 01/08/2012   HGBA1C 6.4 05/18/2013         Assessment & Plan:

## 2013-05-20 NOTE — Telephone Encounter (Signed)
What labs need to be checked in 4 months?

## 2013-05-20 NOTE — Telephone Encounter (Signed)
BMET, A1c, NMR lipids Thx

## 2013-05-21 NOTE — Telephone Encounter (Signed)
Labs entered. Left detailed mess informing pt.  

## 2013-08-10 ENCOUNTER — Other Ambulatory Visit: Payer: Self-pay | Admitting: Urology

## 2013-08-10 ENCOUNTER — Ambulatory Visit (HOSPITAL_COMMUNITY)
Admission: RE | Admit: 2013-08-10 | Discharge: 2013-08-10 | Disposition: A | Discharge status: Home / Self Care | Payer: PRIVATE HEALTH INSURANCE | Source: Ambulatory Visit | Attending: Urology | Admitting: Urology

## 2013-08-10 DIAGNOSIS — R5381 Other malaise: Secondary | ICD-10-CM | POA: Insufficient documentation

## 2013-08-10 DIAGNOSIS — E119 Type 2 diabetes mellitus without complications: Secondary | ICD-10-CM | POA: Insufficient documentation

## 2013-08-10 DIAGNOSIS — C649 Malignant neoplasm of unspecified kidney, except renal pelvis: Secondary | ICD-10-CM

## 2013-08-10 DIAGNOSIS — Z85528 Personal history of other malignant neoplasm of kidney: Secondary | ICD-10-CM | POA: Insufficient documentation

## 2013-08-10 DIAGNOSIS — I10 Essential (primary) hypertension: Secondary | ICD-10-CM | POA: Insufficient documentation

## 2013-09-18 ENCOUNTER — Other Ambulatory Visit (INDEPENDENT_AMBULATORY_CARE_PROVIDER_SITE_OTHER): Payer: PRIVATE HEALTH INSURANCE

## 2013-09-18 DIAGNOSIS — E785 Hyperlipidemia, unspecified: Secondary | ICD-10-CM

## 2013-09-18 DIAGNOSIS — E119 Type 2 diabetes mellitus without complications: Secondary | ICD-10-CM

## 2013-09-18 DIAGNOSIS — I10 Essential (primary) hypertension: Secondary | ICD-10-CM

## 2013-09-18 LAB — BASIC METABOLIC PANEL
BUN: 21 mg/dL (ref 6–23)
CO2: 28 mEq/L (ref 19–32)
Calcium: 9.1 mg/dL (ref 8.4–10.5)
Chloride: 105 mEq/L (ref 96–112)
Creatinine, Ser: 0.8 mg/dL (ref 0.4–1.5)
GFR: 103.61 mL/min (ref >60.00)
Glucose, Bld: 122 mg/dL — ABNORMAL HIGH (ref 70–99)
Potassium: 3.6 mEq/L (ref 3.5–5.1)
Sodium: 140 mEq/L (ref 135–145)

## 2013-09-18 LAB — HEMOGLOBIN A1C: Hgb A1c MFr Bld: 6.6 % — ABNORMAL HIGH (ref 4.6–6.5)

## 2013-09-20 ENCOUNTER — Other Ambulatory Visit: Payer: Self-pay | Admitting: Internal Medicine

## 2013-09-21 ENCOUNTER — Encounter: Payer: Self-pay | Admitting: Internal Medicine

## 2013-09-21 ENCOUNTER — Ambulatory Visit (INDEPENDENT_AMBULATORY_CARE_PROVIDER_SITE_OTHER): Payer: PRIVATE HEALTH INSURANCE | Admitting: Internal Medicine

## 2013-09-21 VITALS — BP 150/88 | HR 80 | Temp 98.5°F | Resp 16 | Wt 201.0 lb

## 2013-09-21 DIAGNOSIS — I10 Essential (primary) hypertension: Secondary | ICD-10-CM

## 2013-09-21 DIAGNOSIS — E119 Type 2 diabetes mellitus without complications: Secondary | ICD-10-CM

## 2013-09-21 DIAGNOSIS — E279 Disorder of adrenal gland, unspecified: Secondary | ICD-10-CM

## 2013-09-21 DIAGNOSIS — M545 Low back pain, unspecified: Secondary | ICD-10-CM

## 2013-09-21 DIAGNOSIS — E785 Hyperlipidemia, unspecified: Secondary | ICD-10-CM

## 2013-09-21 DIAGNOSIS — N200 Calculus of kidney: Secondary | ICD-10-CM

## 2013-09-21 DIAGNOSIS — E278 Other specified disorders of adrenal gland: Secondary | ICD-10-CM

## 2013-09-21 LAB — NMR LIPOPROFILE WITH LIPIDS
Cholesterol, Total: 137 mg/dL (ref <200)
HDL Particle Number: 18.3 umol/L — ABNORMAL LOW (ref >=30.5)
HDL Size: 8.6 nm — ABNORMAL LOW (ref >=9.2)
HDL-C: 25 mg/dL — ABNORMAL LOW (ref >=40)
LDL (calc): 64 mg/dL (ref <100)
LDL Particle Number: 1304 nmol/L — ABNORMAL HIGH (ref <1000)
LDL Size: 20.4 nm — ABNORMAL LOW (ref >20.5)
LP-IR Score: 89 — ABNORMAL HIGH (ref <=45)
Large HDL-P: 1.8 umol/L — ABNORMAL LOW (ref >=4.8)
Large VLDL-P: 10.9 nmol/L — ABNORMAL HIGH (ref <=2.7)
Small LDL Particle Number: 727 nmol/L — ABNORMAL HIGH (ref <=527)
Triglycerides: 238 mg/dL — ABNORMAL HIGH (ref <150)
VLDL Size: 56.9 nm — ABNORMAL HIGH (ref <=46.6)

## 2013-09-21 NOTE — Assessment & Plan Note (Signed)
Continue with current prn prescription therapy as reflected on the Med list.  

## 2013-09-21 NOTE — Assessment & Plan Note (Signed)
S/p R partial nephrectomy for renal cell cancer 10/13 Dr Wrenn 

## 2013-09-21 NOTE — Assessment & Plan Note (Signed)
12/14 relapsed

## 2013-09-21 NOTE — Assessment & Plan Note (Signed)
Continue with current prescription therapy as reflected on the Med list.  

## 2013-09-21 NOTE — Assessment & Plan Note (Signed)
Pt declined statins 

## 2013-09-21 NOTE — Progress Notes (Signed)
Pre visit review using our clinic review tool, if applicable. No additional management support is needed unless otherwise documented below in the visit note. 

## 2013-09-21 NOTE — Progress Notes (Signed)
   Subjective:   HPI  F/u partial nephrectomy R; kidney stones -- s/p lithotripsy  The patient presents for a follow-up of  chronic hypertension, chronic dyslipidemia, type 2 diabetes controlled with medicines  Wt Readings from Last 3 Encounters:  09/21/13 201 lb (91.173 kg)  05/19/13 198 lb (89.812 kg)  02/24/13 205 lb 12.8 oz (93.35 kg)   BP Readings from Last 3 Encounters:  09/21/13 150/88  05/19/13 140/80  03/03/13 150/94        Review of Systems  Constitutional: Negative for appetite change, fatigue and unexpected weight change.  HENT: Positive for hearing loss. Negative for congestion, ear pain, nosebleeds, sneezing, sore throat and trouble swallowing.   Eyes: Negative for itching and visual disturbance.  Respiratory: Negative for cough.   Cardiovascular: Negative for chest pain, palpitations and leg swelling.  Gastrointestinal: Negative for nausea, diarrhea, blood in stool and abdominal distention.  Genitourinary: Negative for dysuria, urgency, frequency, hematuria and decreased urine volume.  Musculoskeletal: Negative for back pain, gait problem, joint swelling and neck pain.  Skin: Negative for rash.  Neurological: Negative for dizziness, tremors, speech difficulty and weakness.  Psychiatric/Behavioral: Negative for sleep disturbance, dysphoric mood and agitation. The patient is not nervous/anxious.          Objective:   Physical Exam  Constitutional: He is oriented to person, place, and time. He appears well-developed.  HENT:  Mouth/Throat: Oropharynx is clear and moist.  Eyes: Conjunctivae are normal. Pupils are equal, round, and reactive to light.  Neck: Normal range of motion. No JVD present. No thyromegaly present.  Cardiovascular: Normal rate, regular rhythm, normal heart sounds and intact distal pulses.  Exam reveals no gallop and no friction rub.   No murmur heard. Pulmonary/Chest: Effort normal and breath sounds normal. No respiratory distress. He  has no wheezes. He has no rales. He exhibits no tenderness.  Abdominal: Soft. Bowel sounds are normal. He exhibits no distension and no mass. There is no tenderness. There is no rebound and no guarding.  Musculoskeletal: Normal range of motion. He exhibits no edema and no tenderness.  Lymphadenopathy:    He has no cervical adenopathy.  Neurological: He is alert and oriented to person, place, and time. He has normal reflexes. No cranial nerve deficit. He exhibits normal muscle tone. Coordination normal.  Skin: Skin is warm and dry. No rash noted.  Psychiatric: He has a normal mood and affect. His behavior is normal. Judgment and thought content normal.  No wax B   Lab Results  Component Value Date   WBC 7.6 01/12/2013   HGB 16.2 01/12/2013   HCT 47.1 01/12/2013   PLT 165.0 01/12/2013   GLUCOSE 122* 09/18/2013   CHOL 122 05/18/2013   TRIG 238* 09/18/2013   HDL 25.80* 05/18/2013   LDLCALC 64 09/18/2013   ALT 21 05/18/2013   AST 14 05/18/2013   NA 140 09/18/2013   K 3.6 09/18/2013   CL 105 09/18/2013   CREATININE 0.8 09/18/2013   BUN 21 09/18/2013   CO2 28 09/18/2013   TSH 0.89 01/12/2013   PSA 2.31 05/24/2011   INR 1.1* 01/08/2012   HGBA1C 6.6* 09/18/2013         Assessment & Plan:

## 2013-11-08 ENCOUNTER — Other Ambulatory Visit: Payer: Self-pay | Admitting: Internal Medicine

## 2013-11-14 ENCOUNTER — Other Ambulatory Visit: Payer: Self-pay | Admitting: Internal Medicine

## 2013-12-21 ENCOUNTER — Other Ambulatory Visit (INDEPENDENT_AMBULATORY_CARE_PROVIDER_SITE_OTHER): Payer: PRIVATE HEALTH INSURANCE

## 2013-12-21 DIAGNOSIS — M545 Low back pain, unspecified: Secondary | ICD-10-CM

## 2013-12-21 DIAGNOSIS — E279 Disorder of adrenal gland, unspecified: Secondary | ICD-10-CM

## 2013-12-21 DIAGNOSIS — N200 Calculus of kidney: Secondary | ICD-10-CM

## 2013-12-21 DIAGNOSIS — E278 Other specified disorders of adrenal gland: Secondary | ICD-10-CM

## 2013-12-21 DIAGNOSIS — E119 Type 2 diabetes mellitus without complications: Secondary | ICD-10-CM

## 2013-12-21 DIAGNOSIS — E785 Hyperlipidemia, unspecified: Secondary | ICD-10-CM

## 2013-12-21 DIAGNOSIS — I10 Essential (primary) hypertension: Secondary | ICD-10-CM

## 2013-12-21 LAB — BASIC METABOLIC PANEL
BUN: 21 mg/dL (ref 6–23)
CO2: 30 mEq/L (ref 19–32)
Calcium: 9.6 mg/dL (ref 8.4–10.5)
Chloride: 106 mEq/L (ref 96–112)
Creatinine, Ser: 0.9 mg/dL (ref 0.4–1.5)
GFR: 91.67 mL/min (ref >60.00)
Glucose, Bld: 118 mg/dL — ABNORMAL HIGH (ref 70–99)
Potassium: 3.7 mEq/L (ref 3.5–5.1)
Sodium: 142 mEq/L (ref 135–145)

## 2013-12-21 LAB — URIC ACID: Uric Acid, Serum: 5.1 mg/dL (ref 4.0–7.8)

## 2013-12-21 LAB — HEMOGLOBIN A1C: Hgb A1c MFr Bld: 6.7 % — ABNORMAL HIGH (ref 4.6–6.5)

## 2013-12-22 ENCOUNTER — Encounter: Payer: Self-pay | Admitting: Internal Medicine

## 2013-12-22 ENCOUNTER — Ambulatory Visit (INDEPENDENT_AMBULATORY_CARE_PROVIDER_SITE_OTHER): Payer: PRIVATE HEALTH INSURANCE | Admitting: Internal Medicine

## 2013-12-22 VITALS — BP 140/85 | HR 80 | Temp 98.1°F | Resp 16 | Wt 200.0 lb

## 2013-12-22 DIAGNOSIS — I10 Essential (primary) hypertension: Secondary | ICD-10-CM

## 2013-12-22 DIAGNOSIS — R972 Elevated prostate specific antigen [PSA]: Secondary | ICD-10-CM

## 2013-12-22 DIAGNOSIS — N200 Calculus of kidney: Secondary | ICD-10-CM

## 2013-12-22 DIAGNOSIS — N289 Disorder of kidney and ureter, unspecified: Secondary | ICD-10-CM

## 2013-12-22 DIAGNOSIS — N2889 Other specified disorders of kidney and ureter: Secondary | ICD-10-CM

## 2013-12-22 DIAGNOSIS — E119 Type 2 diabetes mellitus without complications: Secondary | ICD-10-CM

## 2013-12-22 MED ORDER — PANTOPRAZOLE SODIUM 40 MG PO TBEC: 40.0000 mg | DELAYED_RELEASE_TABLET | Freq: Every day | ORAL | Status: DC

## 2013-12-22 NOTE — Assessment & Plan Note (Signed)
labs

## 2013-12-22 NOTE — Assessment & Plan Note (Signed)
Doing well 

## 2013-12-22 NOTE — Progress Notes (Signed)
Pre visit review using our clinic review tool, if applicable. No additional management support is needed unless otherwise documented below in the visit note. 

## 2013-12-22 NOTE — Progress Notes (Signed)
   Subjective:   HPI  C/o diarrhea x3d  F/u partial nephrectomy R; kidney stones -- s/p lithotripsy  The patient presents for a follow-up of  chronic hypertension, chronic dyslipidemia, type 2 diabetes controlled with medicines  Wt Readings from Last 3 Encounters:  12/22/13 200 lb (90.719 kg)  09/21/13 201 lb (91.173 kg)  05/19/13 198 lb (89.812 kg)   BP Readings from Last 3 Encounters:  12/22/13 160/100  09/21/13 150/88  05/19/13 140/80        Review of Systems  Constitutional: Negative for appetite change, fatigue and unexpected weight change.  HENT: Positive for hearing loss. Negative for congestion, ear pain, nosebleeds, sneezing, sore throat and trouble swallowing.   Eyes: Negative for itching and visual disturbance.  Respiratory: Negative for cough.   Cardiovascular: Negative for chest pain, palpitations and leg swelling.  Gastrointestinal: Negative for nausea, diarrhea, blood in stool and abdominal distention.  Genitourinary: Negative for dysuria, urgency, frequency, hematuria and decreased urine volume.  Musculoskeletal: Negative for back pain, gait problem, joint swelling and neck pain.  Skin: Negative for rash.  Neurological: Negative for dizziness, tremors, speech difficulty and weakness.  Psychiatric/Behavioral: Negative for sleep disturbance, dysphoric mood and agitation. The patient is not nervous/anxious.          Objective:   Physical Exam  Constitutional: He is oriented to person, place, and time. He appears well-developed.  HENT:  Mouth/Throat: Oropharynx is clear and moist.  Eyes: Conjunctivae are normal. Pupils are equal, round, and reactive to light.  Neck: Normal range of motion. No JVD present. No thyromegaly present.  Cardiovascular: Normal rate, regular rhythm, normal heart sounds and intact distal pulses.  Exam reveals no gallop and no friction rub.   No murmur heard. Pulmonary/Chest: Effort normal and breath sounds normal. No respiratory  distress. He has no wheezes. He has no rales. He exhibits no tenderness.  Abdominal: Soft. Bowel sounds are normal. He exhibits no distension and no mass. There is no tenderness. There is no rebound and no guarding.  Musculoskeletal: Normal range of motion. He exhibits no edema and no tenderness.  Lymphadenopathy:    He has no cervical adenopathy.  Neurological: He is alert and oriented to person, place, and time. He has normal reflexes. No cranial nerve deficit. He exhibits normal muscle tone. Coordination normal.  Skin: Skin is warm and dry. No rash noted.  Psychiatric: He has a normal mood and affect. His behavior is normal. Judgment and thought content normal.  No wax B   Lab Results  Component Value Date   WBC 7.6 01/12/2013   HGB 16.2 01/12/2013   HCT 47.1 01/12/2013   PLT 165.0 01/12/2013   GLUCOSE 118* 12/21/2013   CHOL 122 05/18/2013   TRIG 238* 09/18/2013   HDL 25.80* 05/18/2013   LDLCALC 64 09/18/2013   ALT 21 05/18/2013   AST 14 05/18/2013   NA 142 12/21/2013   K 3.7 12/21/2013   CL 106 12/21/2013   CREATININE 0.9 12/21/2013   BUN 21 12/21/2013   CO2 30 12/21/2013   TSH 0.89 01/12/2013   PSA 2.31 05/24/2011   INR 1.1* 01/08/2012   HGBA1C 6.7* 12/21/2013         Assessment & Plan:

## 2013-12-22 NOTE — Assessment & Plan Note (Signed)
Per Urol

## 2013-12-22 NOTE — Assessment & Plan Note (Signed)
Continue with current prescription therapy as reflected on the Med list.  

## 2013-12-25 ENCOUNTER — Telehealth: Payer: Self-pay

## 2013-12-25 NOTE — Telephone Encounter (Signed)
Relevant patient education assigned to patient using Emmi. ° °

## 2014-02-08 ENCOUNTER — Other Ambulatory Visit: Payer: Self-pay | Admitting: *Deleted

## 2014-02-08 MED ORDER — OLMESARTAN MEDOXOMIL-HCTZ 40-12.5 MG PO TABS: 1.0000 | ORAL_TABLET | Freq: Every day | ORAL | Status: DC

## 2014-03-30 ENCOUNTER — Other Ambulatory Visit: Payer: Self-pay | Admitting: Internal Medicine

## 2014-04-09 ENCOUNTER — Telehealth: Payer: Self-pay | Admitting: Internal Medicine

## 2014-04-09 DIAGNOSIS — E279 Disorder of adrenal gland, unspecified: Secondary | ICD-10-CM

## 2014-04-09 NOTE — Telephone Encounter (Signed)
Needs Endo ref per Dr Jeffie Pollock Done

## 2014-04-22 ENCOUNTER — Other Ambulatory Visit (INDEPENDENT_AMBULATORY_CARE_PROVIDER_SITE_OTHER): Payer: PRIVATE HEALTH INSURANCE

## 2014-04-22 DIAGNOSIS — I10 Essential (primary) hypertension: Secondary | ICD-10-CM

## 2014-04-22 DIAGNOSIS — E119 Type 2 diabetes mellitus without complications: Secondary | ICD-10-CM

## 2014-04-22 LAB — HEMOGLOBIN A1C: Hgb A1c MFr Bld: 6.8 % — ABNORMAL HIGH (ref 4.6–6.5)

## 2014-04-22 LAB — BASIC METABOLIC PANEL
BUN: 20 mg/dL (ref 6–23)
CO2: 27 mEq/L (ref 19–32)
Calcium: 9.5 mg/dL (ref 8.4–10.5)
Chloride: 105 mEq/L (ref 96–112)
Creatinine, Ser: 0.8 mg/dL (ref 0.4–1.5)
GFR: 104.9 mL/min (ref >60.00)
Glucose, Bld: 147 mg/dL — ABNORMAL HIGH (ref 70–99)
Potassium: 3.7 mEq/L (ref 3.5–5.1)
Sodium: 140 mEq/L (ref 135–145)

## 2014-04-23 ENCOUNTER — Ambulatory Visit (INDEPENDENT_AMBULATORY_CARE_PROVIDER_SITE_OTHER): Payer: PRIVATE HEALTH INSURANCE | Admitting: Internal Medicine

## 2014-04-23 ENCOUNTER — Encounter: Payer: Self-pay | Admitting: Internal Medicine

## 2014-04-23 VITALS — BP 148/90 | HR 72 | Temp 98.5°F | Resp 16 | Wt 201.0 lb

## 2014-04-23 DIAGNOSIS — M545 Low back pain, unspecified: Secondary | ICD-10-CM

## 2014-04-23 DIAGNOSIS — I10 Essential (primary) hypertension: Secondary | ICD-10-CM

## 2014-04-23 DIAGNOSIS — E278 Other specified disorders of adrenal gland: Secondary | ICD-10-CM

## 2014-04-23 DIAGNOSIS — E119 Type 2 diabetes mellitus without complications: Secondary | ICD-10-CM

## 2014-04-23 DIAGNOSIS — E279 Disorder of adrenal gland, unspecified: Secondary | ICD-10-CM

## 2014-04-23 NOTE — Assessment & Plan Note (Signed)
Continue with current prescription therapy as reflected on the Med list.  

## 2014-04-23 NOTE — Progress Notes (Signed)
Patient ID: [name] [name], male   DOB: 1956/09/02, 58 y.o.   MRN: [id number]   Subjective:   HPI   F/u partial nephrectomy R; kidney stones -- s/p lithotripsy  The patient presents for a follow-up of  chronic hypertension, chronic dyslipidemia, type 2 diabetes controlled with medicines  Wt Readings from Last 3 Encounters:  04/23/14 201 lb (91.173 kg)  12/22/13 200 lb (90.719 kg)  09/21/13 201 lb (91.173 kg)   BP Readings from Last 3 Encounters:  04/23/14 148/90  12/22/13 140/85  09/21/13 150/88        Review of Systems  Constitutional: Negative for appetite change, fatigue and unexpected weight change.  HENT: Positive for hearing loss. Negative for congestion, ear pain, nosebleeds, sneezing, sore throat and trouble swallowing.   Eyes: Negative for itching and visual disturbance.  Respiratory: Negative for cough.   Cardiovascular: Negative for chest pain, palpitations and leg swelling.  Gastrointestinal: Negative for nausea, diarrhea, blood in stool and abdominal distention.  Genitourinary: Negative for dysuria, urgency, frequency, hematuria and decreased urine volume.  Musculoskeletal: Negative for back pain, gait problem, joint swelling and neck pain.  Skin: Negative for rash.  Neurological: Negative for dizziness, tremors, speech difficulty and weakness.  Psychiatric/Behavioral: Negative for sleep disturbance, dysphoric mood and agitation. The patient is not nervous/anxious.          Objective:   Physical Exam  Constitutional: He is oriented to person, place, and time. He appears well-developed.  HENT:  Mouth/Throat: Oropharynx is clear and moist.  Eyes: Conjunctivae are normal. Pupils are equal, round, and reactive to light.  Neck: Normal range of motion. No JVD present. No thyromegaly present.  Cardiovascular: Normal rate, regular rhythm, normal heart sounds and intact distal pulses.  Exam reveals no gallop and no friction rub.   No murmur  heard. Pulmonary/Chest: Effort normal and breath sounds normal. No respiratory distress. He has no wheezes. He has no rales. He exhibits no tenderness.  Abdominal: Soft. Bowel sounds are normal. He exhibits no distension and no mass. There is no tenderness. There is no rebound and no guarding.  Musculoskeletal: Normal range of motion. He exhibits no edema and no tenderness.  Lymphadenopathy:    He has no cervical adenopathy.  Neurological: He is alert and oriented to person, place, and time. He has normal reflexes. No cranial nerve deficit. He exhibits normal muscle tone. Coordination normal.  Skin: Skin is warm and dry. No rash noted.  Psychiatric: He has a normal mood and affect. His behavior is normal. Judgment and thought content normal.  No wax B   Lab Results  Component Value Date   WBC 7.6 01/12/2013   HGB 16.2 01/12/2013   HCT 47.1 01/12/2013   PLT 165.0 01/12/2013   GLUCOSE 147* 04/22/2014   CHOL 122 05/18/2013   TRIG 238* 09/18/2013   HDL 25.80* 05/18/2013   LDLCALC 64 09/18/2013   ALT 21 05/18/2013   AST 14 05/18/2013   NA 140 04/22/2014   K 3.7 04/22/2014   CL 105 04/22/2014   CREATININE 0.8 04/22/2014   BUN 20 04/22/2014   CO2 27 04/22/2014   TSH 0.89 01/12/2013   PSA 2.31 05/24/2011   INR 1.1* 01/08/2012   HGBA1C 6.8* 04/22/2014         Assessment & Plan:

## 2014-04-23 NOTE — Assessment & Plan Note (Signed)
Endo cons w/Dr Loanne Drilling

## 2014-04-23 NOTE — Progress Notes (Signed)
Pre visit review using our clinic review tool, if applicable. No additional management support is needed unless otherwise documented below in the visit note. 

## 2014-04-27 ENCOUNTER — Encounter: Payer: Self-pay | Admitting: Endocrinology

## 2014-04-27 ENCOUNTER — Ambulatory Visit (INDEPENDENT_AMBULATORY_CARE_PROVIDER_SITE_OTHER): Payer: PRIVATE HEALTH INSURANCE | Admitting: Endocrinology

## 2014-04-27 VITALS — BP 150/104 | HR 76 | Temp 98.7°F | Ht 66.0 in | Wt 201.0 lb

## 2014-04-27 DIAGNOSIS — E279 Disorder of adrenal gland, unspecified: Secondary | ICD-10-CM

## 2014-04-27 DIAGNOSIS — E278 Other specified disorders of adrenal gland: Secondary | ICD-10-CM

## 2014-04-27 MED ORDER — DEXAMETHASONE 1 MG PO TABS: ORAL_TABLET | ORAL | Status: DC

## 2014-04-27 NOTE — Patient Instructions (Addendum)
you should do a "dexamethasone suppression test."  for this, you would take dexamethasone 1 mg at 10 pm, then come in for a "cortisol" blood test the next morning before 9 am.  you do not need to be fasting for this test.  Also, here is a paper, requesting a 24-HR urine test.  please bring downstairs.   We'll contact you with results.  If these are normal, no further testing is needed for the adrenal nodules.

## 2014-04-27 NOTE — Progress Notes (Signed)
Subjective:    Patient ID: [name] [name], male    DOB: June 23, 1956, 58 y.o.   MRN: [id number]  HPI In 2013, pt had CT of abdomen for urolithiasis.  Bilateral slight nodules at the adrenals were incidentally noted.  No assoc flushing. Past Medical History  Diagnosis Date  . Hyperlipidemia   . Hypertension   . Elevated PSA   . Elevated glucose   . Osteoarthritis   . Diabetes mellitus type II   . Herniated cervical disc     no current pain  . History of kidney stones   . Substance abuse   . Renal neoplasm     RT  . Cancer 07/28/2012    kidney  . Difficult intubation 07/28/2012    hard to place breathing tube    Past Surgical History  Procedure Laterality Date  . Tonsillectomy  1965  . Kidney surgery Right 07/28/12    Had 10% removed for cancer   . Colonoscopy N/A 03/03/2013    Procedure: COLONOSCOPY;  Surgeon: Irene Shipper, MD;  Location: [facility];  Service: Endoscopy;  Laterality: N/A;    History   Social History  . Marital Status: Married    Spouse Name: N/A    Number of Children: N/A  . Years of Education: N/A   Occupational History  . Not on file.   Social History Main Topics  . Smoking status: Current Every Day Smoker -- 0.50 packs/day for 30 years    Types: Cigarettes  . Smokeless tobacco: Never Used  . Alcohol Use: No     Comment: less than 1 beer a month  . Drug Use: 4.00 per week    Special: Marijuana     Comment: marijuana-smokes daily  . Sexual Activity: Yes   Other Topics Concern  . Not on file   Social History Narrative   Occupation: Personal assistant   Current smoker   Regular exercise- no   Married          Current Outpatient Prescriptions on File Prior to Visit  Medication Sig Dispense Refill  . cholecalciferol (VITAMIN D) 1000 UNITS tablet Take 1 tablet (1,000 Units total) by mouth daily.  100 tablet  3  . metFORMIN (GLUCOPHAGE) 500 MG tablet TAKE 2 TABLETS TWICE A DAY WITH MEAL  120 tablet  5  . olmesartan-hydrochlorothiazide  (BENICAR HCT) 40-12.5 MG per tablet Take 1 tablet by mouth daily before breakfast.  30 tablet  11  . ONETOUCH VERIO test strip TEST UP TO TWO TIMES DAILY DX:250.0  100 each  3  . OVER THE COUNTER MEDICATION Apply 1 application topically 2 (two) times daily. Formula z for toenail fungus      . pantoprazole (PROTONIX) 40 MG tablet Take 1 tablet (40 mg total) by mouth daily.  30 tablet  11   No current facility-administered medications on file prior to visit.    Allergies  Allergen Reactions  . Codeine Sulfate Itching  . Tamsulosin Other (See Comments)    Unable to perform sexually    Family History  Problem Relation Age of Onset  . Lymphoma Mother   . Colon cancer Mother 25  . Colon cancer Paternal Aunt     BP 150/104  Pulse 76  Temp(Src) 98.7 F (37.1 C) (Oral)  Ht 5\' 6"  (1.676 m)  Wt 201 lb (91.173 kg)  BMI 32.46 kg/m2  SpO2 96%  Review of Systems denies weight gain, headache, cold intolerance, excessive diaphoresis, polyuria, erectile dysfunction, sob, insomnia,  hyperpigmentation, cramps, numbness, easy bruising, muscle weakness, depression, and rash on the abdomen.   denies flushing, pallor, n/v, syncope, diarrhea, chest pain, anxiety, visual loss, palpitations, fever, arthralgias, and rhinorrhea.     Objective:   Physical Exam VS: see vs page GEN: no distress HEAD: head: no deformity eyes: no periorbital swelling, no proptosis external nose and ears are normal mouth: no lesion seen.  No neurofibromata NECK: supple, thyroid is not enlarged CHEST WALL: no deformity LUNGS: clear to auscultation BREASTS:  No gynecomastia CV: reg rate and rhythm, no murmur ABD: abdomen is soft, nontender.  no hepatosplenomegaly.  not distended.  Self-reducing ventral hernia MUSCULOSKELETAL: muscle bulk and strength are grossly normal.  no obvious joint swelling.  gait is normal and steady EXTEMITIES: no deformity.  no ulcer on the feet.  feet are of normal color and temp.  no  edema PULSES: dorsalis pedis intact bilat.  no carotid bruit NEURO:  cn 2-12 grossly intact.   readily moves all 4's.  sensation is intact to touch on the feet SKIN:  Normal texture and temperature.  No rash or suspicious lesion is visible.  No cafe-au-lait spots. No striae. NODES:  None palpable at the neck PSYCH: alert, well-oriented.  Does not appear anxious nor depressed.    CT (2015) no change in adrenal nodules.  i reviewed electrocardiogram (2013)  i have reviewed the following outside records: Office notes    Assessment & Plan:  Adrenal nodules, new to me: unchanged.  If this endocrine w/u is neg, no further w/u is needed. HTN: moderate eacerbation

## 2014-05-14 LAB — CATECHOLAMINES, FRACTIONATED, URINE, 24 HOUR
Calculated Total (E+NE): 85 mcg/24 h (ref 26–121)
Creatinine, Urine mg/day-CATEUR: 2.5 g/(24.h) (ref 0.63–2.50)
Dopamine, 24 hr Urine: 431 mcg/24 h (ref 52–480)
Epinephrine, 24 hr Urine: 5 mcg/24 h (ref 2–24)
Norepinephrine, 24 hr Ur: 80 mcg/24 h (ref 15–100)
Total Volume - CF 24Hr U: 1850 mL

## 2014-05-14 LAB — METANEPHRINES, URINE, 24 HOUR
Metaneph Total, Ur: 566 mcg/24 h (ref 224–832)
Metanephrines, Ur: 125 mcg/24 h (ref 90–315)
Normetanephrine, 24H Ur: 441 mcg/24 h (ref 122–676)

## 2014-05-26 ENCOUNTER — Other Ambulatory Visit (INDEPENDENT_AMBULATORY_CARE_PROVIDER_SITE_OTHER): Payer: PRIVATE HEALTH INSURANCE

## 2014-05-26 DIAGNOSIS — E278 Other specified disorders of adrenal gland: Secondary | ICD-10-CM

## 2014-05-26 DIAGNOSIS — E279 Disorder of adrenal gland, unspecified: Secondary | ICD-10-CM

## 2014-05-26 LAB — CORTISOL: Cortisol, Plasma: 3 ug/dL

## 2014-06-04 LAB — ALDOSTERONE + RENIN ACTIVITY W/ RATIO
ALDO / PRA Ratio: 8.3 Ratio (ref 0.9–28.9)
Aldosterone: 3 ng/dL
PRA LC/MS/MS: 0.36 ng/mL/h (ref 0.25–5.82)

## 2014-06-24 ENCOUNTER — Telehealth: Payer: Self-pay

## 2014-06-24 NOTE — Telephone Encounter (Signed)
Re: called to schedule Nurse Visit to recheck Blood Pressure 

## 2014-07-06 ENCOUNTER — Other Ambulatory Visit: Payer: Self-pay | Admitting: Internal Medicine

## 2014-08-24 ENCOUNTER — Ambulatory Visit: Payer: PRIVATE HEALTH INSURANCE | Admitting: Internal Medicine

## 2014-09-07 ENCOUNTER — Other Ambulatory Visit (INDEPENDENT_AMBULATORY_CARE_PROVIDER_SITE_OTHER): Payer: PRIVATE HEALTH INSURANCE

## 2014-09-07 ENCOUNTER — Other Ambulatory Visit: Payer: Self-pay | Admitting: Internal Medicine

## 2014-09-07 DIAGNOSIS — Z0000 Encounter for general adult medical examination without abnormal findings: Secondary | ICD-10-CM

## 2014-09-07 LAB — LIPID PANEL
Cholesterol: 138 mg/dL (ref 0–200)
HDL: 27.8 mg/dL — ABNORMAL LOW (ref >39.00)
LDL Cholesterol: 88 mg/dL (ref 0–99)
NonHDL: 110.2
Total CHOL/HDL Ratio: 5
Triglycerides: 113 mg/dL (ref 0.0–149.0)
VLDL: 22.6 mg/dL (ref 0.0–40.0)

## 2014-09-07 LAB — BASIC METABOLIC PANEL
BUN: 18 mg/dL (ref 6–23)
CO2: 27 mEq/L (ref 19–32)
Calcium: 9 mg/dL (ref 8.4–10.5)
Chloride: 105 mEq/L (ref 96–112)
Creatinine, Ser: 0.7 mg/dL (ref 0.4–1.5)
GFR: 114.62 mL/min (ref >60.00)
Glucose, Bld: 119 mg/dL — ABNORMAL HIGH (ref 70–99)
Potassium: 3.6 mEq/L (ref 3.5–5.1)
Sodium: 141 mEq/L (ref 135–145)

## 2014-09-07 LAB — HEPATIC FUNCTION PANEL
ALT: 32 U/L (ref 0–53)
AST: 19 U/L (ref 0–37)
Albumin: 4.1 g/dL (ref 3.5–5.2)
Alkaline Phosphatase: 94 U/L (ref 39–117)
Bilirubin, Direct: 0.1 mg/dL (ref 0.0–0.3)
Total Bilirubin: 0.6 mg/dL (ref 0.2–1.2)
Total Protein: 6.6 g/dL (ref 6.0–8.3)

## 2014-09-07 LAB — HEMOGLOBIN A1C: Hgb A1c MFr Bld: 6.8 % — ABNORMAL HIGH (ref 4.6–6.5)

## 2014-09-08 ENCOUNTER — Ambulatory Visit (INDEPENDENT_AMBULATORY_CARE_PROVIDER_SITE_OTHER): Payer: PRIVATE HEALTH INSURANCE | Admitting: Internal Medicine

## 2014-09-08 ENCOUNTER — Encounter: Payer: Self-pay | Admitting: Internal Medicine

## 2014-09-08 VITALS — BP 140/84 | HR 67 | Temp 98.4°F | Wt 201.0 lb

## 2014-09-08 DIAGNOSIS — E118 Type 2 diabetes mellitus with unspecified complications: Secondary | ICD-10-CM

## 2014-09-08 DIAGNOSIS — I10 Essential (primary) hypertension: Secondary | ICD-10-CM

## 2014-09-08 DIAGNOSIS — E785 Hyperlipidemia, unspecified: Secondary | ICD-10-CM

## 2014-09-08 DIAGNOSIS — N200 Calculus of kidney: Secondary | ICD-10-CM

## 2014-09-08 NOTE — Progress Notes (Signed)
   Subjective:   HPI   F/u partial nephrectomy R; kidney stones -- s/p lithotripsy  The patient presents for a follow-up of  chronic hypertension, chronic dyslipidemia, type 2 diabetes controlled with medicines  Wt Readings from Last 3 Encounters:  09/08/14 201 lb (91.173 kg)  04/27/14 201 lb (91.173 kg)  04/23/14 201 lb (91.173 kg)   BP Readings from Last 3 Encounters:  09/08/14 140/84  04/27/14 150/104  04/23/14 148/90        Review of Systems  Constitutional: Negative for appetite change, fatigue and unexpected weight change.  HENT: Positive for hearing loss. Negative for congestion, ear pain, nosebleeds, sneezing, sore throat and trouble swallowing.   Eyes: Negative for itching and visual disturbance.  Respiratory: Negative for cough.   Cardiovascular: Negative for chest pain, palpitations and leg swelling.  Gastrointestinal: Negative for nausea, diarrhea, blood in stool and abdominal distention.  Genitourinary: Negative for dysuria, urgency, frequency, hematuria and decreased urine volume.  Musculoskeletal: Negative for back pain, joint swelling, gait problem and neck pain.  Skin: Negative for rash.  Neurological: Negative for dizziness, tremors, speech difficulty and weakness.  Psychiatric/Behavioral: Negative for sleep disturbance, dysphoric mood and agitation. The patient is not nervous/anxious.          Objective:   Physical Exam  Constitutional: He is oriented to person, place, and time. He appears well-developed. No distress.  NAD  HENT:  Mouth/Throat: Oropharynx is clear and moist.  Eyes: Conjunctivae are normal. Pupils are equal, round, and reactive to light.  Neck: Normal range of motion. No JVD present. No thyromegaly present.  Cardiovascular: Normal rate, regular rhythm, normal heart sounds and intact distal pulses.  Exam reveals no gallop and no friction rub.   No murmur heard. Pulmonary/Chest: Effort normal and breath sounds normal. No respiratory  distress. He has no wheezes. He has no rales. He exhibits no tenderness.  Abdominal: Soft. Bowel sounds are normal. He exhibits no distension and no mass. There is no tenderness. There is no rebound and no guarding.  Musculoskeletal: Normal range of motion. He exhibits no edema or tenderness.  Lymphadenopathy:    He has no cervical adenopathy.  Neurological: He is alert and oriented to person, place, and time. He has normal reflexes. No cranial nerve deficit. He exhibits normal muscle tone. He displays a negative Romberg sign. Coordination and gait normal.  No meningeal signs  Skin: Skin is warm and dry. No rash noted.  Psychiatric: He has a normal mood and affect. His behavior is normal. Judgment and thought content normal.     Lab Results  Component Value Date   WBC 7.6 01/12/2013   HGB 16.2 01/12/2013   HCT 47.1 01/12/2013   PLT 165.0 01/12/2013   GLUCOSE 119* 09/07/2014   CHOL 138 09/07/2014   TRIG 113.0 09/07/2014   HDL 27.80* 09/07/2014   LDLCALC 88 09/07/2014   ALT 32 09/07/2014   AST 19 09/07/2014   NA 141 09/07/2014   K 3.6 09/07/2014   CL 105 09/07/2014   CREATININE 0.7 09/07/2014   BUN 18 09/07/2014   CO2 27 09/07/2014   TSH 0.89 01/12/2013   PSA 2.31 05/24/2011   INR 1.1* 01/08/2012   HGBA1C 6.8* 09/07/2014         Assessment & Plan:

## 2014-09-08 NOTE — Assessment & Plan Note (Signed)
Continue with current prescription therapy as reflected on the Med list.  

## 2014-09-08 NOTE — Assessment & Plan Note (Signed)
No relapse 

## 2014-09-08 NOTE — Progress Notes (Signed)
Pre visit review using our clinic review tool, if applicable. No additional management support is needed unless otherwise documented below in the visit note. 

## 2014-09-10 ENCOUNTER — Encounter: Payer: Self-pay | Admitting: Internal Medicine

## 2014-09-10 MED ORDER — PANTOPRAZOLE SODIUM 40 MG PO TBEC: 40.0000 mg | DELAYED_RELEASE_TABLET | Freq: Every day | ORAL | Status: DC

## 2014-09-15 ENCOUNTER — Telehealth: Payer: Self-pay | Admitting: Internal Medicine

## 2014-09-15 NOTE — Telephone Encounter (Signed)
emmi emailed °

## 2014-11-09 ENCOUNTER — Ambulatory Visit (INDEPENDENT_AMBULATORY_CARE_PROVIDER_SITE_OTHER): Payer: PRIVATE HEALTH INSURANCE | Admitting: Internal Medicine

## 2014-11-09 ENCOUNTER — Encounter: Payer: Self-pay | Admitting: Internal Medicine

## 2014-11-09 VITALS — BP 156/98 | HR 71 | Temp 97.2°F | Wt 205.0 lb

## 2014-11-09 DIAGNOSIS — L723 Sebaceous cyst: Secondary | ICD-10-CM

## 2014-11-09 DIAGNOSIS — L089 Local infection of the skin and subcutaneous tissue, unspecified: Secondary | ICD-10-CM | POA: Insufficient documentation

## 2014-11-09 MED ORDER — DOXYCYCLINE HYCLATE 100 MG PO TABS: 100.0000 mg | ORAL_TABLET | Freq: Two times a day (BID) | ORAL | Status: DC

## 2014-11-09 NOTE — Progress Notes (Signed)
Pre visit review using our clinic review tool, if applicable. No additional management support is needed unless otherwise documented below in the visit note. 

## 2014-11-09 NOTE — Patient Instructions (Addendum)
Wound instructions provided.   Wound instructions : change dressing once a day or twice a day is needed. Change dressing after  shower in the morning.  Pat dry the wound with gauze. Pull out one inch of packing everyday and cut it off - it will take 2 days total for you. Re-dress wound with antibiotic ointment and Telfa pad or a bandage   Please contact us if you notice a recollection of pus in the abscess fever and chills increased pain redness red streaks near the abscess increased swelling in the area.

## 2014-11-09 NOTE — Assessment & Plan Note (Signed)
1/16 R post neck See Procedure Doxy 100 mg bid x 7 d

## 2014-11-09 NOTE — Progress Notes (Signed)
   PE: VSS NAD Lungs CTA Heart RRR abd S/NT Skin: L post neck abscess, oval 3.0x2.5 cm;    Procedure note:  Incision and Drainage of an Abscess   Indication : a localized collection of pus that is tender and not spontaneously resolving.    Risks including unsuccessful procedure , possible need for a repeat procedure due to pus accumulation, scar formation, and others as well as benefits were explained to the patient in detail. Written consent was obtained/signed.    The patient was placed in a decubitus position. The area of an abscess was prepped with povidone-iodine and draped in a sterile fashion. Local anesthesia with   2    cc of 2% lidocaine and epinephrine  was administered.  1 cm incision with #11strait blade was made. About 3 cc of purulent material was expressed. The abscess cavity was explored with a sterile hemostat and the walled- off pockets and septae were broken down bluntly. The cavity was irrigated with the rest of the anesthetic in the syringe and packed with 3 inches of  the iodoform gauze.   The wound was dressed with antibiotic ointment and Telfa pad.  Tolerated well. Complications: None.   Wound instructions provided.   Wound instructions : change dressing once a day or twice a day is needed. Change dressing after  shower in the morning.  Pat dry the wound with gauze. Pull out one inch of packing everyday and cut it off. Re-dress wound with antibiotic ointment and Telfa pad or a Band-Aid of appropriate size.   Please contact us if you notice a recollection of pus in the abscess fever and chills increased pain redness red streaks near the abscess increased swelling in the area.

## 2014-12-23 ENCOUNTER — Ambulatory Visit (INDEPENDENT_AMBULATORY_CARE_PROVIDER_SITE_OTHER): Payer: PRIVATE HEALTH INSURANCE | Admitting: Family

## 2014-12-23 ENCOUNTER — Encounter: Payer: Self-pay | Admitting: Family

## 2014-12-23 VITALS — BP 170/100 | HR 90 | Temp 98.4°F | Resp 18 | Ht 66.0 in | Wt 198.0 lb

## 2014-12-23 DIAGNOSIS — I10 Essential (primary) hypertension: Secondary | ICD-10-CM

## 2014-12-23 DIAGNOSIS — J00 Acute nasopharyngitis [common cold]: Secondary | ICD-10-CM

## 2014-12-23 MED ORDER — OLMESARTAN MEDOXOMIL-HCTZ 40-25 MG PO TABS: 1.0000 | ORAL_TABLET | Freq: Every day | ORAL | Status: DC

## 2014-12-23 NOTE — Progress Notes (Signed)
Pre visit review using our clinic review tool, if applicable. No additional management support is needed unless otherwise documented below in the visit note. 

## 2014-12-23 NOTE — Progress Notes (Signed)
   Subjective:    Patient ID: [name] [name], male    DOB: 09/14/1956, 59 y.o.   MRN: [id number]  Chief Complaint  Patient presents with  . Fever    fever, chills, body aches and productive cough, started yesterday     HPI:  [name] [name] is a 59 y.o. male who presents today for an acute visit. His wife is present for today's visit with his permission.   This is a new problem. Associated symptoms of fevers, chills and productive cough which have been going on since yesterday. Has taken Tylenol this morning for the body aches which helped quite a bit. Indicates that he feels 95% better today.    Allergies  Allergen Reactions  . Codeine Sulfate Itching  . Tamsulosin Other (See Comments)    Unable to perform sexually    Current Outpatient Prescriptions on File Prior to Visit  Medication Sig Dispense Refill  . metFORMIN (GLUCOPHAGE) 500 MG tablet TAKE 2 TABLETS BY MOUTH TWICE DAILY WITH MEAL 120 tablet 5  . olmesartan-hydrochlorothiazide (BENICAR HCT) 40-12.5 MG per tablet Take 1 tablet by mouth daily before breakfast. 30 tablet 11  . ONETOUCH VERIO test strip TEST UP TO TWO TIMES DAILY DX:250.0 100 each 3  . OVER THE COUNTER MEDICATION Apply 1 application topically 2 (two) times daily. Formula z for toenail fungus    . pantoprazole (PROTONIX) 40 MG tablet Take 1 tablet (40 mg total) by mouth daily. 30 tablet 11   No current facility-administered medications on file prior to visit.    Review of Systems  Constitutional: Positive for fever and chills.  HENT: Negative for congestion, rhinorrhea, sinus pressure and sore throat.   Respiratory: Positive for cough.   Neurological: Positive for headaches.       Objective:    BP 170/100 mmHg  Pulse 90  Temp(Src) 98.4 F (36.9 C) (Oral)  Resp 18  Ht 5\' 6"  (1.676 m)  Wt 198 lb (89.812 kg)  BMI 31.97 kg/m2  SpO2 97% Nursing note and vital signs reviewed.  Physical Exam  Constitutional: He is oriented to person, place,  and time. He appears well-developed and well-nourished. No distress.  HENT:  Right Ear: Hearing, external ear and ear canal normal.  Left Ear: Hearing, tympanic membrane, external ear and ear canal normal.  Nose: Nose normal. Right sinus exhibits no maxillary sinus tenderness and no frontal sinus tenderness. Left sinus exhibits no maxillary sinus tenderness and no frontal sinus tenderness.  Mouth/Throat: Uvula is midline, oropharynx is clear and moist and mucous membranes are normal.  Moderate amount of cerumen present in right ear present.   Cardiovascular: Normal rate, regular rhythm, normal heart sounds and intact distal pulses.   Pulmonary/Chest: Effort normal and breath sounds normal.  Neurological: He is alert and oriented to person, place, and time.  Skin: Skin is warm and dry.  Psychiatric: He has a normal mood and affect. His behavior is normal. Judgment and thought content normal.       Assessment & Plan:

## 2014-12-23 NOTE — Assessment & Plan Note (Signed)
Symptoms and exam consistent with viral infection. Continue over-the-counter medication as needed for symptom relief and supportive care. Follow-up if symptoms worsen or fail to improve.

## 2014-12-23 NOTE — Patient Instructions (Addendum)
Thank you for choosing [facility].  Summary/Instructions:  We have increased your Benicar HCT. Please finish what you have now and start with the next refill.  If your symptoms worsen or fail to improve, please contact our office for further instruction, or in case of emergency go directly to the emergency room at the closest medical facility.   General Recommendations:    Please drink plenty of fluids.  Get plenty of rest   Sleep in humidified air  Use saline nasal sprays  Netti pot   OTC Medications:  Decongestants - helps relieve congestion   Flonase (generic fluticasone) or Nasacort (generic triamcinolone) - please make sure to use the "cross-over" technique at a 45 degree angle towards the opposite eye as opposed to straight up the nasal passageway.   Sudafed (generic pseudoephedrine - Note this is the one that is available behind the pharmacy counter); Products with phenylephrine (-PE) may also be used but is often not as effective as pseudoephedrine.   If you have HIGH BLOOD PRESSURE - Coricidin HBP; AVOID any product that is -D as this contains pseudoephedrine which may increase your blood pressure.  Afrin (oxymetazoline) every 6-8 hours for up to 3 days.   Allergies - helps relieve runny nose, itchy eyes and sneezing   Claritin (generic loratidine), Allegra (fexofenidine), or Zyrtec (generic cyrterizine) for runny nose. These medications should not cause drowsiness.  Note - Benadryl (generic diphenhydramine) may be used however may cause drowsiness  Cough -   Delsym or Robitussin (generic dextromethorphan)  Expectorants - helps loosen mucus to ease removal   Mucinex (generic guaifenesin) as directed on the package.  Headaches / General Aches   Tylenol (generic acetaminophen) - DO NOT EXCEED 3 grams (3,000 mg) in a 24 hour time period  Advil/Motrin (generic ibuprofen)   Sore Throat -   Salt water gargle   Chloraseptic (generic benzocaine)  spray or lozenges / Sucrets (generic dyclonine)

## 2014-12-23 NOTE — Assessment & Plan Note (Signed)
Blood pressure was noted to be elevated today. Patient is currently taking Benicar HCT. Previous blood pressure readings were also elevated. Increase Benicar HCT at next refill. Follow-up with PCP at next scheduled appointment.

## 2014-12-29 ENCOUNTER — Other Ambulatory Visit: Payer: Self-pay | Admitting: Urology

## 2014-12-31 ENCOUNTER — Encounter (HOSPITAL_COMMUNITY): Payer: Self-pay | Admitting: *Deleted

## 2015-01-05 NOTE — H&P (Signed)
Active Problems Problems  1. 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 Microscopic hematuria (R31.2) 4. Nephrolithiasis (N20.0) 5. Renal cell carcinoma, unspecified laterality (C64.9)  History of Present Illness [name] [name] returns today in f/u with the onset of back pain in January and he also has had dark urine. He has a history of stones with prior ESWL and known residual bilateral stones. The pain hasn't lateralized and is moderate but bearable.  He has had no nausea and reports no voiding difficulties. He has some urinary urgency.  He has adrenal adenomas and his only functional issue was a mild elevation of the cortisol.   Past Medical History Problems  1. History of Degeneration of cervical intervertebral disc (M50.90) 2. History of diabetes mellitus (Z86.39) 3. History of glaucoma (Z86.69) 4. History of hypertension (Z86.79)  Surgical History Problems  1. History of Kidney Surgery Laparoscopic Partial Nephrectomy 2. History of Lithotripsy  Current Meds 1. Benicar HCT 40-25 MG Oral Tablet;  Therapy: (Recorded:14Mar2016) to Recorded 2. MetFORMIN HCl - 500 MG Oral Tablet;  Therapy: [id number] to Recorded 3. Vitamin D TABS;  Therapy: (BTDVVOHY:07PXT0626) to Recorded  Allergies Medication  1. Codeine Derivatives 2. Flomax CAPS 3. Vicodin TABS 4. Ondansetron HCl TABS  Family History Problems  1. Family history of Colon Cancer : Mother 2. Denied: Family history of Kidney Cancer  Social History Problems  1. Denied: Alcohol Use 2. Current every day smoker (F17.200) 3. Marital History - Currently Married 4. 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 Tobacco Use 6. Using Marijuana   daily smoker  Past and social history reviewed and updated. He is cutting down the cigarette use but hasn't stopped.   Review of Systems Genitourinary, constitutional, skin, eye, otolaryngeal,  hematologic/lymphatic, cardiovascular, pulmonary, endocrine, musculoskeletal, gastrointestinal, neurological and psychiatric system(s) were reviewed and pertinent findings if present are noted and are otherwise negative.  Genitourinary: hematuria.  Musculoskeletal: back pain.    Vitals Vital Signs [Data Includes: Last 1 Day]  Recorded: [id number] 03:59PM  Blood Pressure: 135 / 88 Temperature: 97.6 F Heart Rate: 75  Physical Exam Constitutional: Well nourished and well developed . No acute distress.  Pulmonary: No respiratory distress and normal respiratory rhythm and effort.  Cardiovascular: Heart rate and rhythm are normal . No peripheral edema.    Results/Data Urine [Data Includes: Last 1 Day]   [id number]  COLOR AMBER   APPEARANCE CLOUDY   SPECIFIC GRAVITY 1.020   pH 5.0   GLUCOSE NEG mg/dL  BILIRUBIN NEG   KETONE NEG mg/dL  BLOOD LARGE   PROTEIN 100 mg/dL  UROBILINOGEN 0.2 mg/dL  NITRITE NEG   LEUKOCYTE ESTERASE TRACE   SQUAMOUS EPITHELIAL/HPF RARE   WBC 3-6 WBC/hpf  RBC TNTC RBC/hpf  BACTERIA FEW   CRYSTALS NONE SEEN   CASTS NONE SEEN    The following images/tracing/specimen were independently visualized:  CT shows a non-obstructing 63mm left renal pelvic stone with some pelvic inflammatory changes. A full report is pending.  The following clinical lab reports were reviewed:  UA reviewed.    Assessment Assessed  1. Nephrolithiasis (N20.0) 2. Microscopic hematuria (R31.2)  He has a mildly symptomatic 7 x 57mm stone in the left renal pelvis with hematuria.   He has some pyuria but no symptoms to suggest infection.   Plan  Health Maintenance  1. UA With REFLEX; [Do Not Release]; Status:Resulted - Requires Verification;   Done:  [id number] 03:41PM Microscopic hematuria, Nephrolithiasis  2. AU CT-STONE PROTOCOL; Status:Resulted -  Requires Verification;   Done: [id number]  12:00AM Nephrolithiasis  3. Renew: HYDROmorphone HCl - 2 MG Oral Tablet; One to two po q 4-6  hours prn pain 4. Follow-up Schedule Surgery Office  Follow-up  Status: Hold For - Appointment   Requested for: [id number]  I am going to get a urine culture since he is going to need ESWL for the stone. He has had success with that in the past and I think it is the most appropriate initial option for treating his current stone.  I reviewed the risks of bleeding, infection, injury to the kidney or adjacent organs that can result in severe and even life threatening bleeding or organ loss, need for secondary procedures for residual fragments, thrombotic events and sedation complications.  I have given him a script for po Dilaudid for post procedure pain. He is getting by with tylenol now.      URINE CULTURE; Status:In Progress - Specimen/Data Collected;  Done: [id number]  Perform:[facility]; Due:16Mar2016; Marked Important; Last Updated OI:PPGFQM, Debbie; 12/27/2014 5:20:59 PM;Ordered; Today;   KJI:[id number] hematuria; Ordered JP:VGKKD, Bev Drennen;   Electronics engineer signed by : Irine Seal, M.D.; Dec 27 2014  5:26PM EST

## 2015-01-06 ENCOUNTER — Other Ambulatory Visit: Payer: Self-pay | Admitting: Urology

## 2015-01-06 ENCOUNTER — Ambulatory Visit (HOSPITAL_COMMUNITY): Payer: PRIVATE HEALTH INSURANCE

## 2015-01-06 ENCOUNTER — Encounter (HOSPITAL_COMMUNITY)
Admission: RE | Disposition: A | Discharge status: Home / Self Care | Payer: Self-pay | Source: Ambulatory Visit | Attending: Urology

## 2015-01-06 ENCOUNTER — Ambulatory Visit (HOSPITAL_COMMUNITY)
Admission: RE | Admit: 2015-01-06 | Discharge: 2015-01-06 | Disposition: A | Discharge status: Home / Self Care | Payer: PRIVATE HEALTH INSURANCE | Source: Ambulatory Visit | Attending: Urology | Admitting: Urology

## 2015-01-06 DIAGNOSIS — N200 Calculus of kidney: Secondary | ICD-10-CM

## 2015-01-06 HISTORY — DX: Gastro-esophageal reflux disease without esophagitis: K21.9

## 2015-01-06 SURGERY — LITHOTRIPSY, ESWL
Anesthesia: LOCAL | Laterality: Left

## 2015-01-06 MED ORDER — CIPROFLOXACIN HCL 500 MG PO TABS: 500.0000 mg | ORAL_TABLET | ORAL | Status: DC

## 2015-01-06 MED ORDER — DIAZEPAM 5 MG PO TABS: 10.0000 mg | ORAL_TABLET | ORAL | Status: DC

## 2015-01-06 MED ORDER — DIPHENHYDRAMINE HCL 25 MG PO CAPS: 25.0000 mg | ORAL_CAPSULE | ORAL | Status: DC

## 2015-01-06 MED ORDER — DEXTROSE-NACL 5-0.45 % IV SOLN: INTRAVENOUS | Status: DC

## 2015-01-06 NOTE — Progress Notes (Signed)
Patient arrived for ESWL. States he drank sweet tea this morning. Dr Jeffie Pollock made aware. Procedure rescheduled to 01/10/15.

## 2015-01-10 ENCOUNTER — Encounter (HOSPITAL_COMMUNITY)
Admission: RE | Disposition: A | Discharge status: Home / Self Care | Payer: Self-pay | Source: Ambulatory Visit | Attending: Urology

## 2015-01-10 ENCOUNTER — Encounter (HOSPITAL_COMMUNITY): Payer: Self-pay | Admitting: General Practice

## 2015-01-10 ENCOUNTER — Ambulatory Visit (HOSPITAL_COMMUNITY): Payer: PRIVATE HEALTH INSURANCE

## 2015-01-10 ENCOUNTER — Ambulatory Visit (HOSPITAL_COMMUNITY)
Admission: RE | Admit: 2015-01-10 | Discharge: 2015-01-10 | Disposition: A | Discharge status: Home / Self Care | Payer: PRIVATE HEALTH INSURANCE | Source: Ambulatory Visit | Attending: Urology | Admitting: Urology

## 2015-01-10 DIAGNOSIS — Z886 Allergy status to analgesic agent status: Secondary | ICD-10-CM | POA: Insufficient documentation

## 2015-01-10 DIAGNOSIS — N200 Calculus of kidney: Secondary | ICD-10-CM | POA: Diagnosis present

## 2015-01-10 DIAGNOSIS — I10 Essential (primary) hypertension: Secondary | ICD-10-CM | POA: Insufficient documentation

## 2015-01-10 DIAGNOSIS — F17210 Nicotine dependence, cigarettes, uncomplicated: Secondary | ICD-10-CM | POA: Diagnosis not present

## 2015-01-10 DIAGNOSIS — Z888 Allergy status to other drugs, medicaments and biological substances status: Secondary | ICD-10-CM | POA: Insufficient documentation

## 2015-01-10 DIAGNOSIS — E119 Type 2 diabetes mellitus without complications: Secondary | ICD-10-CM | POA: Diagnosis not present

## 2015-01-10 DIAGNOSIS — R312 Other microscopic hematuria: Secondary | ICD-10-CM | POA: Insufficient documentation

## 2015-01-10 LAB — GLUCOSE, CAPILLARY: Glucose-Capillary: 166 mg/dL — ABNORMAL HIGH (ref 70–99)

## 2015-01-10 SURGERY — LITHOTRIPSY, ESWL
Anesthesia: LOCAL | Laterality: Left

## 2015-01-10 MED ORDER — ACETAMINOPHEN 325 MG PO TABS: 650.0000 mg | ORAL_TABLET | ORAL | Status: DC | PRN

## 2015-01-10 MED ORDER — DIPHENHYDRAMINE HCL 25 MG PO CAPS
25.0000 mg | ORAL_CAPSULE | ORAL | Status: AC
  Administered 2015-01-10: 25 mg via ORAL
  Filled 2015-01-10: qty 1

## 2015-01-10 MED ORDER — DEXTROSE-NACL 5-0.45 % IV SOLN
INTRAVENOUS | Status: DC
  Administered 2015-01-10: 07:00:00 via INTRAVENOUS

## 2015-01-10 MED ORDER — OXYCODONE HCL 5 MG PO TABS: 5.0000 mg | ORAL_TABLET | ORAL | Status: DC | PRN

## 2015-01-10 MED ORDER — SODIUM CHLORIDE 0.9 % IJ SOLN: 3.0000 mL | Freq: Two times a day (BID) | INTRAMUSCULAR | Status: DC

## 2015-01-10 MED ORDER — DIAZEPAM 5 MG PO TABS
10.0000 mg | ORAL_TABLET | ORAL | Status: AC
  Administered 2015-01-10: 10 mg via ORAL
  Filled 2015-01-10: qty 2

## 2015-01-10 MED ORDER — SODIUM CHLORIDE 0.9 % IJ SOLN: 3.0000 mL | INTRAMUSCULAR | Status: DC | PRN

## 2015-01-10 MED ORDER — CIPROFLOXACIN HCL 500 MG PO TABS
500.0000 mg | ORAL_TABLET | ORAL | Status: AC
  Administered 2015-01-10: 500 mg via ORAL
  Filled 2015-01-10: qty 1

## 2015-01-10 MED ORDER — FENTANYL CITRATE 0.05 MG/ML IJ SOLN: 25.0000 ug | INTRAMUSCULAR | Status: DC | PRN

## 2015-01-10 MED ORDER — ACETAMINOPHEN 650 MG RE SUPP: 650.0000 mg | RECTAL | Status: DC | PRN

## 2015-01-10 MED ORDER — SODIUM CHLORIDE 0.9 % IV SOLN: 250.0000 mL | INTRAVENOUS | Status: DC | PRN

## 2015-01-10 NOTE — H&P (View-Only) (Signed)
Active Problems Problems  1. 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 Microscopic hematuria (R31.2) 4. Nephrolithiasis (N20.0) 5. Renal cell carcinoma, unspecified laterality (C64.9)  History of Present Illness [name] [name] returns today in f/u with the onset of back pain in January and he also has had dark urine. He has a history of stones with prior ESWL and known residual bilateral stones. The pain hasn't lateralized and is moderate but bearable.  He has had no nausea and reports no voiding difficulties. He has some urinary urgency.  He has adrenal adenomas and his only functional issue was a mild elevation of the cortisol.   Past Medical History Problems  1. History of Degeneration of cervical intervertebral disc (M50.90) 2. History of diabetes mellitus (Z86.39) 3. History of glaucoma (Z86.69) 4. History of hypertension (Z86.79)  Surgical History Problems  1. History of Kidney Surgery Laparoscopic Partial Nephrectomy 2. History of Lithotripsy  Current Meds 1. Benicar HCT 40-25 MG Oral Tablet;  Therapy: (Recorded:14Mar2016) to Recorded 2. MetFORMIN HCl - 500 MG Oral Tablet;  Therapy: [id number] to Recorded 3. Vitamin D TABS;  Therapy: (HYQMVHQI:[id number]) to Recorded  Allergies Medication  1. Codeine Derivatives 2. Flomax CAPS 3. Vicodin TABS 4. Ondansetron HCl TABS  Family History Problems  1. Family history of Colon Cancer : Mother 2. Denied: Family history of Kidney Cancer  Social History Problems  1. Denied: Alcohol Use 2. Current every day smoker (F17.200) 3. Marital History - Currently Married 4. 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 Tobacco Use 6. Using Marijuana   daily smoker  Past and social history reviewed and updated. He is cutting down the cigarette use but hasn't stopped.   Review of Systems Genitourinary, constitutional, skin, eye, otolaryngeal,  hematologic/lymphatic, cardiovascular, pulmonary, endocrine, musculoskeletal, gastrointestinal, neurological and psychiatric system(s) were reviewed and pertinent findings if present are noted and are otherwise negative.  Genitourinary: hematuria.  Musculoskeletal: back pain.    Vitals Vital Signs [Data Includes: Last 1 Day]  Recorded: [id number] 03:59PM  Blood Pressure: 135 / 88 Temperature: 97.6 F Heart Rate: 75  Physical Exam Constitutional: Well nourished and well developed . No acute distress.  Pulmonary: No respiratory distress and normal respiratory rhythm and effort.  Cardiovascular: Heart rate and rhythm are normal . No peripheral edema.    Results/Data Urine [Data Includes: Last 1 Day]   [id number]  COLOR AMBER   APPEARANCE CLOUDY   SPECIFIC GRAVITY 1.020   pH 5.0   GLUCOSE NEG mg/dL  BILIRUBIN NEG   KETONE NEG mg/dL  BLOOD LARGE   PROTEIN 100 mg/dL  UROBILINOGEN 0.2 mg/dL  NITRITE NEG   LEUKOCYTE ESTERASE TRACE   SQUAMOUS EPITHELIAL/HPF RARE   WBC 3-6 WBC/hpf  RBC TNTC RBC/hpf  BACTERIA FEW   CRYSTALS NONE SEEN   CASTS NONE SEEN    The following images/tracing/specimen were independently visualized:  CT shows a non-obstructing 26mm left renal pelvic stone with some pelvic inflammatory changes. A full report is pending.  The following clinical lab reports were reviewed:  UA reviewed.    Assessment Assessed  1. Nephrolithiasis (N20.0) 2. Microscopic hematuria (R31.2)  He has a mildly symptomatic 7 x 67mm stone in the left renal pelvis with hematuria.   He has some pyuria but no symptoms to suggest infection.   Plan  Health Maintenance  1. UA With REFLEX; [Do Not Release]; Status:Resulted - Requires Verification;   Done:  [id number] 03:41PM Microscopic hematuria, Nephrolithiasis  2. AU CT-STONE PROTOCOL; Status:Resulted -  Requires Verification;   Done: [id number]  12:00AM Nephrolithiasis  3. Renew: HYDROmorphone HCl - 2 MG Oral Tablet; One to two po q 4-6  hours prn pain 4. Follow-up Schedule Surgery Office  Follow-up  Status: Hold For - Appointment   Requested for: [id number]  I am going to get a urine culture since he is going to need ESWL for the stone. He has had success with that in the past and I think it is the most appropriate initial option for treating his current stone.  I reviewed the risks of bleeding, infection, injury to the kidney or adjacent organs that can result in severe and even life threatening bleeding or organ loss, need for secondary procedures for residual fragments, thrombotic events and sedation complications.  I have given him a script for po Dilaudid for post procedure pain. He is getting by with tylenol now.      URINE CULTURE; Status:In Progress - Specimen/Data Collected;  Done: [id number]  Perform:[facility]; Due:16Mar2016; Marked Important; Last Updated LK:JZPHXT, Debbie; 12/27/2014 5:20:59 PM;Ordered; Today;   AVW:[id number] hematuria; Ordered ZS:MOLMB, Ronda Rajkumar;   Electronics engineer signed by : Irine Seal, M.D.; Dec 27 2014  5:26PM EST

## 2015-01-10 NOTE — Discharge Instructions (Addendum)

## 2015-01-10 NOTE — Interval H&P Note (Signed)
History and Physical Interval Note:  ESWL rescheduled from 3/24 to 3/2 since he drank tea prior to the procedure on 3/24.  01/10/2015 9:42 AM  [name]  has presented today for surgery, with the diagnosis of LEFT RENAL PELVIC STONE  The various methods of treatment have been discussed with the patient and family. After consideration of risks, benefits and other options for treatment, the patient has consented to  Procedure(s): LEFT EXTRACORPOREAL SHOCK WAVE LITHOTRIPSY (ESWL) (Left) as a surgical intervention .  The patient's history has been reviewed, patient examined, no change in status, stable for surgery.  I have reviewed the patient's chart and labs.  Questions were answered to the patient's satisfaction.     Louvina Cleary J

## 2015-01-12 ENCOUNTER — Encounter: Payer: Self-pay | Admitting: Internal Medicine

## 2015-01-12 ENCOUNTER — Ambulatory Visit (INDEPENDENT_AMBULATORY_CARE_PROVIDER_SITE_OTHER): Payer: PRIVATE HEALTH INSURANCE | Admitting: Internal Medicine

## 2015-01-12 VITALS — BP 136/88 | HR 73 | Temp 98.6°F | Resp 16 | Ht 66.0 in | Wt 198.2 lb

## 2015-01-12 DIAGNOSIS — E118 Type 2 diabetes mellitus with unspecified complications: Secondary | ICD-10-CM

## 2015-01-12 DIAGNOSIS — I10 Essential (primary) hypertension: Secondary | ICD-10-CM

## 2015-01-12 DIAGNOSIS — N200 Calculus of kidney: Secondary | ICD-10-CM | POA: Diagnosis not present

## 2015-01-12 MED ORDER — AMLODIPINE BESYLATE 5 MG PO TABS: 5.0000 mg | ORAL_TABLET | Freq: Every day | ORAL | Status: DC

## 2015-01-12 NOTE — Assessment & Plan Note (Signed)
Chronic Benicar HCT; added Norvasc

## 2015-01-12 NOTE — Progress Notes (Signed)
Subjective:   HPI  C/o elev BP at his second lithotripsy 5 d ago: SBP170, DBP117 - they had to abort  F/u partial nephrectomy R; kidney stones -- s/p lithotripsy x2 The patient presents for a follow-up of  chronic hypertension, chronic dyslipidemia, type 2 diabetes controlled with medicines  Wt Readings from Last 3 Encounters:  01/12/15 198 lb 4 oz (89.926 kg)  01/10/15 195 lb 2 oz (88.508 kg)  01/06/15 194 lb (87.998 kg)   BP Readings from Last 3 Encounters:  01/12/15 136/88  01/10/15 138/82  01/06/15 140/91        Review of Systems  Constitutional: Negative for appetite change, fatigue and unexpected weight change.  HENT: Positive for hearing loss. Negative for congestion, ear pain, nosebleeds, sneezing, sore throat and trouble swallowing.   Eyes: Negative for itching and visual disturbance.  Respiratory: Negative for cough.   Cardiovascular: Negative for chest pain, palpitations and leg swelling.  Gastrointestinal: Negative for nausea, diarrhea, blood in stool and abdominal distention.  Genitourinary: Negative for dysuria, urgency, frequency, hematuria and decreased urine volume.  Musculoskeletal: Negative for back pain, joint swelling, gait problem and neck pain.  Skin: Negative for rash.  Neurological: Negative for dizziness, tremors, speech difficulty and weakness.  Psychiatric/Behavioral: Negative for sleep disturbance, dysphoric mood and agitation. The patient is not nervous/anxious.          Objective:   Physical Exam  Constitutional: He is oriented to person, place, and time. He appears well-developed. No distress.  NAD  HENT:  Mouth/Throat: Oropharynx is clear and moist.  Eyes: Conjunctivae are normal. Pupils are equal, round, and reactive to light.  Neck: Normal range of motion. No JVD present. No thyromegaly present.  Cardiovascular: Normal rate, regular rhythm, normal heart sounds and intact distal pulses.  Exam reveals no gallop and no friction rub.    No murmur heard. Pulmonary/Chest: Effort normal and breath sounds normal. No respiratory distress. He has no wheezes. He has no rales. He exhibits no tenderness.  Abdominal: Soft. Bowel sounds are normal. He exhibits no distension and no mass. There is no tenderness. There is no rebound and no guarding.  Musculoskeletal: Normal range of motion. He exhibits no edema or tenderness.  Lymphadenopathy:    He has no cervical adenopathy.  Neurological: He is alert and oriented to person, place, and time. He has normal reflexes. No cranial nerve deficit. He exhibits normal muscle tone. He displays a negative Romberg sign. Coordination and gait normal.  No meningeal signs  Skin: Skin is warm and dry. No rash noted.  Psychiatric: He has a normal mood and affect. His behavior is normal. Judgment and thought content normal.     Lab Results  Component Value Date   WBC 7.6 01/12/2013   HGB 16.2 01/12/2013   HCT 47.1 01/12/2013   PLT 165.0 01/12/2013   GLUCOSE 119* 09/07/2014   CHOL 138 09/07/2014   TRIG 113.0 09/07/2014   HDL 27.80* 09/07/2014   LDLCALC 88 09/07/2014   ALT 32 09/07/2014   AST 19 09/07/2014   NA 141 09/07/2014   K 3.6 09/07/2014   CL 105 09/07/2014   CREATININE 0.7 09/07/2014   BUN 18 09/07/2014   CO2 27 09/07/2014   TSH 0.89 01/12/2013   PSA 2.31 05/24/2011   INR 1.1* 01/08/2012   HGBA1C 6.8* 09/07/2014         Assessment & Plan:  Patient ID: [name] [name], male   DOB: [birth year], 59 y.o.   MRN:  [id number]  

## 2015-01-12 NOTE — Assessment & Plan Note (Signed)
On metformin

## 2015-01-12 NOTE — Assessment & Plan Note (Signed)
2016: relapsed -  s/p lithotripsy

## 2015-01-12 NOTE — Progress Notes (Signed)
Pre visit review using our clinic review tool, if applicable. No additional management support is needed unless otherwise documented below in the visit note. 

## 2015-02-03 ENCOUNTER — Other Ambulatory Visit: Payer: Self-pay | Admitting: Internal Medicine

## 2015-02-07 ENCOUNTER — Telehealth: Payer: Self-pay | Admitting: Internal Medicine

## 2015-02-07 DIAGNOSIS — E118 Type 2 diabetes mellitus with unspecified complications: Secondary | ICD-10-CM

## 2015-02-07 DIAGNOSIS — I10 Essential (primary) hypertension: Secondary | ICD-10-CM

## 2015-02-07 NOTE — Telephone Encounter (Signed)
Called pt no answer LMOM with md response.../lmb 

## 2015-02-07 NOTE — Telephone Encounter (Signed)
A1c, BMET Thx!

## 2015-02-07 NOTE — Telephone Encounter (Signed)
I scheduled an appointment for him on 8/26 and he wanted lab orders put in for that appointment.

## 2015-02-09 ENCOUNTER — Ambulatory Visit: Payer: PRIVATE HEALTH INSURANCE | Admitting: Internal Medicine

## 2015-05-07 ENCOUNTER — Other Ambulatory Visit: Payer: Self-pay | Admitting: Family

## 2015-05-21 ENCOUNTER — Other Ambulatory Visit: Payer: Self-pay | Admitting: Family

## 2015-06-10 ENCOUNTER — Ambulatory Visit: Payer: PRIVATE HEALTH INSURANCE | Admitting: Internal Medicine

## 2015-06-23 ENCOUNTER — Other Ambulatory Visit: Payer: Self-pay | Admitting: *Deleted

## 2015-06-23 MED ORDER — AMLODIPINE BESYLATE 5 MG PO TABS: 5.0000 mg | ORAL_TABLET | Freq: Every day | ORAL | Status: DC

## 2015-06-23 MED ORDER — OLMESARTAN MEDOXOMIL-HCTZ 40-25 MG PO TABS: 1.0000 | ORAL_TABLET | Freq: Every day | ORAL | Status: DC

## 2015-06-24 ENCOUNTER — Telehealth: Payer: Self-pay | Admitting: *Deleted

## 2015-06-24 MED ORDER — LOSARTAN POTASSIUM-HCTZ 100-25 MG PO TABS: 1.0000 | ORAL_TABLET | Freq: Every day | ORAL | Status: DC

## 2015-06-24 NOTE — Telephone Encounter (Signed)
PCP aware. He advises Losartan/HCTZ 100/25 mg 1 po qd. New Rx sent. See meds. Pt informed

## 2015-06-24 NOTE — Telephone Encounter (Signed)
Pt's ins plan is not covering Benicar HCT because it is too soon. He lost the last bottle and I sent a new Rx yesterday. Will he be ok with just Amlodipine 5 mg or can we give him a 15 day supply of something to cover him until he can fill  Benicar/ HCT on 07/10/15? Please advise.

## 2015-07-04 ENCOUNTER — Other Ambulatory Visit (INDEPENDENT_AMBULATORY_CARE_PROVIDER_SITE_OTHER): Payer: PRIVATE HEALTH INSURANCE

## 2015-07-04 DIAGNOSIS — E118 Type 2 diabetes mellitus with unspecified complications: Secondary | ICD-10-CM

## 2015-07-04 DIAGNOSIS — I10 Essential (primary) hypertension: Secondary | ICD-10-CM

## 2015-07-04 LAB — BASIC METABOLIC PANEL
BUN: 18 mg/dL (ref 6–23)
CO2: 28 mEq/L (ref 19–32)
Calcium: 9.6 mg/dL (ref 8.4–10.5)
Chloride: 103 mEq/L (ref 96–112)
Creatinine, Ser: 0.81 mg/dL (ref 0.40–1.50)
GFR: 102.99 mL/min (ref >60.00)
Glucose, Bld: 148 mg/dL — ABNORMAL HIGH (ref 70–99)
Potassium: 3.4 mEq/L — ABNORMAL LOW (ref 3.5–5.1)
Sodium: 140 mEq/L (ref 135–145)

## 2015-07-04 LAB — HEMOGLOBIN A1C: Hgb A1c MFr Bld: 7.4 % — ABNORMAL HIGH (ref 4.6–6.5)

## 2015-07-06 ENCOUNTER — Encounter: Payer: Self-pay | Admitting: Internal Medicine

## 2015-07-06 ENCOUNTER — Ambulatory Visit (INDEPENDENT_AMBULATORY_CARE_PROVIDER_SITE_OTHER): Payer: PRIVATE HEALTH INSURANCE | Admitting: Internal Medicine

## 2015-07-06 ENCOUNTER — Telehealth: Payer: Self-pay | Admitting: Internal Medicine

## 2015-07-06 VITALS — BP 150/90 | HR 103 | Wt 195.0 lb

## 2015-07-06 DIAGNOSIS — Z0000 Encounter for general adult medical examination without abnormal findings: Secondary | ICD-10-CM

## 2015-07-06 DIAGNOSIS — K047 Periapical abscess without sinus: Secondary | ICD-10-CM

## 2015-07-06 DIAGNOSIS — I10 Essential (primary) hypertension: Secondary | ICD-10-CM | POA: Diagnosis not present

## 2015-07-06 DIAGNOSIS — E118 Type 2 diabetes mellitus with unspecified complications: Secondary | ICD-10-CM | POA: Diagnosis not present

## 2015-07-06 DIAGNOSIS — L723 Sebaceous cyst: Secondary | ICD-10-CM

## 2015-07-06 NOTE — Assessment & Plan Note (Signed)
Chronic On Metformin 

## 2015-07-06 NOTE — Progress Notes (Signed)
Pre visit review using our clinic review tool, if applicable. No additional management support is needed unless otherwise documented below in the visit note. 

## 2015-07-06 NOTE — Assessment & Plan Note (Signed)
Benicar HCT; added Norvasc

## 2015-07-06 NOTE — Progress Notes (Signed)
Subjective:  Patient ID: [name] [name], male    DOB: [birth year]  Age: 59 y.o. MRN: [id number]  CC: No chief complaint on file.   HPI [name] [name] presents for HTN, DM, OA. C/o cyst on back.  Outpatient Prescriptions Prior to Visit  Medication Sig Dispense Refill  . amLODipine (NORVASC) 5 MG tablet Take 1 tablet (5 mg total) by mouth daily. 30 tablet 0  . cholecalciferol (VITAMIN D) 1000 UNITS tablet Take 1,000 Units by mouth daily.    Marland Kitchen losartan-hydrochlorothiazide (HYZAAR) 100-25 MG per tablet Take 1 tablet by mouth daily. 30 tablet 0  . metFORMIN (GLUCOPHAGE) 500 MG tablet TAKE 2 TABLETS TWICE A DAY WITH MEALS 120 tablet 5  . olmesartan-hydrochlorothiazide (BENICAR HCT) 40-25 MG per tablet Take 1 tablet by mouth daily. 30 tablet 0  . ONETOUCH VERIO test strip TEST UP TO TWO TIMES DAILY DX:250.0 100 each 3  . pantoprazole (PROTONIX) 40 MG tablet Take 1 tablet (40 mg total) by mouth daily. (Patient not taking: Reported on 07/06/2015) 30 tablet 11   No facility-administered medications prior to visit.    ROS Review of Systems  Constitutional: Negative for appetite change, fatigue and unexpected weight change.  HENT: Negative for congestion, nosebleeds, sneezing, sore throat and trouble swallowing.   Eyes: Negative for itching and visual disturbance.  Respiratory: Negative for cough.   Cardiovascular: Negative for chest pain, palpitations and leg swelling.  Gastrointestinal: Negative for nausea, diarrhea, blood in stool and abdominal distention.  Genitourinary: Negative for frequency and hematuria.  Musculoskeletal: Positive for arthralgias. Negative for back pain, joint swelling, gait problem and neck pain.  Skin: Negative for rash.  Neurological: Negative for dizziness, tremors, speech difficulty and weakness.  Psychiatric/Behavioral: Negative for suicidal ideas, sleep disturbance, dysphoric mood and agitation. The patient is not nervous/anxious.     Objective:  BP  150/90 mmHg  Pulse 103  Wt 195 lb (88.451 kg)  SpO2 96%  BP Readings from Last 3 Encounters:  07/06/15 150/90  01/12/15 136/88  01/10/15 138/82    Wt Readings from Last 3 Encounters:  07/06/15 195 lb (88.451 kg)  01/12/15 198 lb 4 oz (89.926 kg)  01/10/15 195 lb 2 oz (88.508 kg)    Physical Exam  Constitutional: He is oriented to person, place, and time. He appears well-developed. No distress.  NAD  HENT:  Mouth/Throat: Oropharynx is clear and moist.  Eyes: Conjunctivae are normal. Pupils are equal, round, and reactive to light.  Neck: Normal range of motion. No JVD present. No thyromegaly present.  Cardiovascular: Normal rate, regular rhythm, normal heart sounds and intact distal pulses.  Exam reveals no gallop and no friction rub.   No murmur heard. Pulmonary/Chest: Effort normal and breath sounds normal. No respiratory distress. He has no wheezes. He has no rales. He exhibits no tenderness.  Abdominal: Soft. Bowel sounds are normal. He exhibits no distension and no mass. There is no tenderness. There is no rebound and no guarding.  Musculoskeletal: Normal range of motion. He exhibits no edema or tenderness.  Lymphadenopathy:    He has no cervical adenopathy.  Neurological: He is alert and oriented to person, place, and time. He has normal reflexes. No cranial nerve deficit. He exhibits normal muscle tone. He displays a negative Romberg sign. Coordination and gait normal.  Skin: Skin is warm and dry. No rash noted.  Psychiatric: He has a normal mood and affect. His behavior is normal. Judgment and thought content normal.  large seb cysts  on L upper back x2  Lab Results  Component Value Date   WBC 7.6 01/12/2013   HGB 16.2 01/12/2013   HCT 47.1 01/12/2013   PLT 165.0 01/12/2013   GLUCOSE 148* 07/04/2015   CHOL 138 09/07/2014   TRIG 113.0 09/07/2014   HDL 27.80* 09/07/2014   LDLCALC 88 09/07/2014   ALT 32 09/07/2014   AST 19 09/07/2014   NA 140 07/04/2015   K 3.4*  07/04/2015   CL 103 07/04/2015   CREATININE 0.81 07/04/2015   BUN 18 07/04/2015   CO2 28 07/04/2015   TSH 0.89 01/12/2013   PSA 2.31 05/24/2011   INR 1.1* 01/08/2012   HGBA1C 7.4* 07/04/2015    Dg Abd 1 View  01/10/2015   CLINICAL DATA:  Pre lithotripsy radiograph for left-sided renal pelvis stone. Left-sided abdominal pain.  EXAM: ABDOMEN - 1 VIEW  COMPARISON:  CT of the abdomen and pelvis performed 12/27/2014  FINDINGS: A dominant 1.6 cm stone is noted overlying the left renal pelvis, as on the prior study.  The visualized bowel gas pattern is grossly unremarkable. A small amount of stool is noted within the colon. No bowel dilatation is seen to suggest obstruction. No free intra-abdominal air is seen, though evaluation for free air is limited on a single supine view. No acute osseous abnormalities are seen.  IMPRESSION: Dominant 1.6 cm stone noted overlying the left renal pelvis, as on the prior study.   Electronically Signed   By: Garald Balding M.D.   On: 01/10/2015 06:40    Assessment & Plan:   Diagnoses and all orders for this visit:  DM type 2, controlled, with complication  Essential hypertension  Abscessed tooth  Sebaceous cyst -     Ambulatory referral to General Surgery  I am having [name] [name] maintain his ONETOUCH VERIO, pantoprazole, cholecalciferol, metFORMIN, amLODipine, olmesartan-hydrochlorothiazide, losartan-hydrochlorothiazide, and clindamycin.  Meds ordered this encounter  Medications  . clindamycin (CLEOCIN) 150 MG capsule    Sig: Take 150 mg by mouth 4 (four) times daily.     Follow-up: Return in about 4 months (around 11/05/2015).  Walker Kehr, MD

## 2015-07-06 NOTE — Assessment & Plan Note (Signed)
9/16 growing large seb cysts on L upper back x2 Surg ref

## 2015-07-06 NOTE — Assessment & Plan Note (Signed)
On Abx po - Clindamycin

## 2015-07-06 NOTE — Telephone Encounter (Signed)
Patient was seen today and he was hoping to get his labs done before his next appointment in January

## 2015-07-07 ENCOUNTER — Other Ambulatory Visit (HOSPITAL_COMMUNITY): Payer: Self-pay | Admitting: Adult Health

## 2015-07-07 ENCOUNTER — Ambulatory Visit (HOSPITAL_COMMUNITY)
Admission: RE | Admit: 2015-07-07 | Discharge: 2015-07-07 | Disposition: A | Discharge status: Home / Self Care | Payer: PRIVATE HEALTH INSURANCE | Source: Ambulatory Visit | Attending: Adult Health | Admitting: Adult Health

## 2015-07-07 DIAGNOSIS — C649 Malignant neoplasm of unspecified kidney, except renal pelvis: Secondary | ICD-10-CM

## 2015-07-07 NOTE — Telephone Encounter (Signed)
Sending to PCP to identify if this is okay and which labs to have done.

## 2015-07-08 NOTE — Telephone Encounter (Signed)
BMET, A1c Thx

## 2015-07-08 NOTE — Telephone Encounter (Signed)
Left detailed message with pt.

## 2015-07-11 ENCOUNTER — Ambulatory Visit (HOSPITAL_COMMUNITY)
Admission: RE | Admit: 2015-07-11 | Discharge: 2015-07-11 | Disposition: A | Discharge status: Home / Self Care | Payer: PRIVATE HEALTH INSURANCE | Source: Ambulatory Visit | Attending: Adult Health | Admitting: Adult Health

## 2015-07-11 ENCOUNTER — Other Ambulatory Visit (HOSPITAL_COMMUNITY): Payer: Self-pay | Admitting: Adult Health

## 2015-07-11 DIAGNOSIS — C649 Malignant neoplasm of unspecified kidney, except renal pelvis: Secondary | ICD-10-CM

## 2015-07-19 ENCOUNTER — Other Ambulatory Visit: Payer: Self-pay | Admitting: Surgery

## 2015-07-20 ENCOUNTER — Other Ambulatory Visit: Payer: Self-pay | Admitting: Surgery

## 2015-07-20 ENCOUNTER — Other Ambulatory Visit: Payer: Self-pay | Admitting: Internal Medicine

## 2015-07-20 NOTE — H&P (Addendum)
[name] [name] 07/19/2015 2:19 PM Location: [facility] Patient #: [id number] DOB: 05-23-1956 Married / Language: English / Race: White Male  History of Present Illness Adin Hector MD; 07/19/2015 3:00 PM) The patient is a 59 year old male who presents with a soft tissue mass. Patient sent by his primary care physician Dr. Max Fickle Plotnikov for concern of recurrent and enlarging back masses. Probable sebaceous cysts Pleasant obese smoking diabetic male. Getting back on his smoking to less than 1/2 pack a day. Metformin seems to control his diabetes. He has a history of recurrent inflamed or infected sebaceous cyst. Sent to have a few lanced. Recalls one lanced and LEFT open on his LEFT neck/care. Also one removed in his upper back. Concerned that the lump is come back on his upper back and another larger mass. Primary care physician wondered if patient would benefit from removal. Patient is Back and smoking been unable to completely stop. He does not recall having open wounds on his back. No fevers or chills. Not on any antibiotics. No history of resistant staph other abnormalities. No history of fall or trauma. No history of neurofibromatosis or lipomas.   Other Problems Elbert Ewings, CMA; 07/19/2015 2:19 PM) Diabetes Mellitus High blood pressure  Past Surgical History Elbert Ewings, CMA; 07/19/2015 2:19 PM) Colon Polyp Removal - Colonoscopy Colon Polyp Removal - Open  Allergies Elbert Ewings, CMA; 07/19/2015 2:20 PM) Codeine Sulfate *ANALGESICS - OPIOID* Tamsulosin HCl *GENITOURINARY AGENTS - MISCELLANEOUS*  Medication History Elbert Ewings, CMA; 07/19/2015 2:21 PM) AmLODIPine Besylate (5MG  Tablet, Oral) Active. Benicar HCT (40-25MG  Tablet, Oral) Active. Losartan Potassium-HCTZ (100-25MG  Tablet, Oral) Active. MetFORMIN HCl (500MG  Tablet, Oral) Active. Vitamin D (Cholecalciferol) (1000UNIT Tablet, Oral) Active. Medications Reconciled  Social History  Elbert Ewings, [place]; 07/19/2015 2:19 PM) Alcohol use Occasional alcohol use. Caffeine use Carbonated beverages, Coffee. Illicit drug use Prefer to discuss with provider. Tobacco use Current some day smoker.  Family History Elbert Ewings, [place]; 07/19/2015 2:19 PM) Colon Cancer Family Members In General, Mother.  Review of Systems Elbert Ewings CMA; 07/19/2015 2:19 PM) General Not Present- Appetite Loss, Chills, Fatigue, Fever, Night Sweats, Weight Gain and Weight Loss. HEENT Not Present- Earache, Hearing Loss, Hoarseness, Nose Bleed, Oral Ulcers, Ringing in the Ears, Seasonal Allergies, Sinus Pain, Sore Throat, Visual Disturbances, Wears glasses/contact lenses and Yellow Eyes. Breast Not Present- Breast Mass, Breast Pain, Nipple Discharge and Skin Changes. Cardiovascular Not Present- Chest Pain, Difficulty Breathing Lying Down, Leg Cramps, Palpitations, Rapid Heart Rate, Shortness of Breath and Swelling of Extremities. Gastrointestinal Not Present- Abdominal Pain, Bloating, Bloody Stool, Change in Bowel Habits, Chronic diarrhea, Constipation, Difficulty Swallowing, Excessive gas, Gets full quickly at meals, Hemorrhoids, Indigestion, Nausea, Rectal Pain and Vomiting. Male Genitourinary Not Present- Blood in Urine, Change in Urinary Stream, Frequency, Impotence, Nocturia, Painful Urination, Urgency and Urine Leakage. Endocrine Not Present- Cold Intolerance, Excessive Hunger, Hair Changes, Heat Intolerance, Hot flashes and New Diabetes. Hematology Not Present- Easy Bruising, Excessive bleeding, Gland problems, HIV and Persistent Infections.   Vitals Elbert Ewings CMA; 07/19/2015 2:21 PM) 07/19/2015 2:21 PM Weight: 194 lb Height: 66in Body Surface Area: 2.02 m Body Mass Index: 31.31 kg/m Temp.: 98.14F(Temporal)  Pulse: 70 (Regular)  BP: 138/78 (Sitting, Left Arm, Standard)  General: Pt awake/alert/oriented x4 in no major acute distress Eyes: PERRL, normal EOM. Sclera  nonicteric Neuro: CN II-XII intact w/o focal sensory/motor deficits. Lymph: No head/neck/groin lymphadenopathy Psych:  No delerium/psychosis/paranoia HENT: Normocephalic, Mucus membranes moist.  No thrush Neck: Supple, No tracheal deviation  Chest: No pain.  Good respiratory excursion. CV:  Pulses intact.  Regular rhythm  MS: Normal AROM mjr joints.  No obvious deformity.3.5 x 3 cm LEFT upper back subcutaneous mass most likely consistent with recurrent epidermal inclusion cyst. 2.5 x 2.5cm cyst in the LEFT upper lumbar paramedian region as well. Somewhat firm but no evidence of any fluctuance or infection.    Abdomen: Soft, Nondistended.  Nontender.  No incarcerated hernias. Ext:  SCDs BLE.  No significant edema.  No cyanosis Skin: No petechiae / purpura   Assessment & Plan Adin Hector MD; 07/19/2015 2:58 PM) EPIDERMAL CYST (L72.0) Impression: 3.5 x 3 cm LEFT upper back subcutaneous mass most likely consistent with recurrent epidermal inclusion cyst. 2.5 x 2.5cm cyst in the LEFT upper lumbar paramedian region as well. Somewhat firm but no evidence of any fluctuance or infection.  I think he would benefit from removal since one is recurrent and they both are getting larger.Marland Kitchen He is interested in that. He would prefer to have it done under local anesthetic in the minor OR room. We'll block out a time in the office to do this.  I did note that risks of recurrence and are higher in someone who smokes and is a diabetic. He is working to quit. I strongly encouraged him to do that. Continue diabetic control with the metformin hypoglycemic Current Plans  You are being scheduled for surgery - Our schedulers will call you.  You should hear from our office's scheduling department within 5 working days about the location, date, and time of surgery. We try to make accommodations for patient's preferences in scheduling surgery, but sometimes the OR schedule or the surgeon's schedule prevents [place] from  making those accommodations.  If you have not heard from our office [phone]) in 5 working days, call the office and ask for your surgeon's nurse.  If you have other questions about your diagnosis, plan, or surgery, call the office and ask for your surgeon's nurse. Pt Education - [facility] - General recommendations Pt Education - Overview of benign lesions of the skin: discussed with patient and provided information. Pt Education - [facility] Pain Control (Aritza Brunet) Pt Education - [facility] General Post-op HCI TOBACCO ABUSE (Z72.0) Current Plans Pt Education - [facility] STOP SMOKING!   Signed by Adin Hector, MD (07/19/2015 3:00 PM)  Adin Hector, M.D., F.A.C.S. Gastrointestinal and Minimally Invasive Surgery [facility] [address] [place], [place] [address] [phone] Main / Paging

## 2015-07-30 ENCOUNTER — Other Ambulatory Visit: Payer: Self-pay | Admitting: Internal Medicine

## 2015-10-15 ENCOUNTER — Other Ambulatory Visit: Payer: Self-pay | Admitting: Internal Medicine

## 2015-11-04 ENCOUNTER — Other Ambulatory Visit (INDEPENDENT_AMBULATORY_CARE_PROVIDER_SITE_OTHER): Payer: PRIVATE HEALTH INSURANCE

## 2015-11-04 DIAGNOSIS — Z0000 Encounter for general adult medical examination without abnormal findings: Secondary | ICD-10-CM | POA: Diagnosis not present

## 2015-11-04 LAB — BASIC METABOLIC PANEL
BUN: 19 mg/dL (ref 6–23)
CO2: 28 mEq/L (ref 19–32)
Calcium: 9.4 mg/dL (ref 8.4–10.5)
Chloride: 107 mEq/L (ref 96–112)
Creatinine, Ser: 0.81 mg/dL (ref 0.40–1.50)
GFR: 102.87 mL/min (ref >60.00)
Glucose, Bld: 163 mg/dL — ABNORMAL HIGH (ref 70–99)
Potassium: 3.6 mEq/L (ref 3.5–5.1)
Sodium: 142 mEq/L (ref 135–145)

## 2015-11-04 LAB — HEMOGLOBIN A1C: Hgb A1c MFr Bld: 7.2 % — ABNORMAL HIGH (ref 4.6–6.5)

## 2015-11-07 ENCOUNTER — Encounter: Payer: Self-pay | Admitting: Internal Medicine

## 2015-11-07 ENCOUNTER — Ambulatory Visit (INDEPENDENT_AMBULATORY_CARE_PROVIDER_SITE_OTHER): Payer: PRIVATE HEALTH INSURANCE | Admitting: Internal Medicine

## 2015-11-07 VITALS — BP 130/78 | HR 78 | Wt 198.0 lb

## 2015-11-07 DIAGNOSIS — R972 Elevated prostate specific antigen [PSA]: Secondary | ICD-10-CM | POA: Diagnosis not present

## 2015-11-07 DIAGNOSIS — E118 Type 2 diabetes mellitus with unspecified complications: Secondary | ICD-10-CM | POA: Diagnosis not present

## 2015-11-07 DIAGNOSIS — M8949 Other hypertrophic osteoarthropathy, multiple sites: Secondary | ICD-10-CM

## 2015-11-07 DIAGNOSIS — M150 Primary generalized (osteo)arthritis: Secondary | ICD-10-CM

## 2015-11-07 DIAGNOSIS — Z0000 Encounter for general adult medical examination without abnormal findings: Secondary | ICD-10-CM

## 2015-11-07 DIAGNOSIS — I10 Essential (primary) hypertension: Secondary | ICD-10-CM | POA: Diagnosis not present

## 2015-11-07 DIAGNOSIS — M159 Polyosteoarthritis, unspecified: Secondary | ICD-10-CM

## 2015-11-07 NOTE — Assessment & Plan Note (Signed)
F/u w/Urology 

## 2015-11-07 NOTE — Assessment & Plan Note (Signed)
Vit D Doing well

## 2015-11-07 NOTE — Progress Notes (Signed)
Subjective:  Patient ID: [name] [name], male    DOB: [birth year]  Age: 60 y.o. MRN: [id number]  CC: No chief complaint on file.   HPI [name] [name] presents for HTN, DM f/u  Outpatient Prescriptions Prior to Visit  Medication Sig Dispense Refill  . amLODipine (NORVASC) 5 MG tablet TAKE 1 TABLET (5 MG TOTAL) BY MOUTH DAILY. 30 tablet 5  . cholecalciferol (VITAMIN D) 1000 UNITS tablet Take 1,000 Units by mouth daily.    Marland Kitchen losartan-hydrochlorothiazide (HYZAAR) 100-25 MG tablet TAKE 1 TABLET BY MOUTH DAILY. 90 tablet 3  . metFORMIN (GLUCOPHAGE) 500 MG tablet TAKE 2 TABLETS TWICE A DAY WITH MEALS 120 tablet 5  . ONETOUCH VERIO test strip TEST UP TO TWO TIMES DAILY DX:250.0 100 each 3  . olmesartan-hydrochlorothiazide (BENICAR HCT) 40-25 MG per tablet Take 1 tablet by mouth daily. (Patient not taking: Reported on 11/07/2015) 30 tablet 0  . pantoprazole (PROTONIX) 40 MG tablet Take 1 tablet (40 mg total) by mouth daily. (Patient not taking: Reported on 11/07/2015) 30 tablet 11  . clindamycin (CLEOCIN) 150 MG capsule Take 150 mg by mouth 4 (four) times daily. Reported on 11/07/2015     No facility-administered medications prior to visit.    ROS Review of Systems  Constitutional: Negative for appetite change, fatigue and unexpected weight change.  HENT: Negative for congestion, nosebleeds, sneezing, sore throat and trouble swallowing.   Eyes: Negative for itching and visual disturbance.  Respiratory: Negative for cough.   Cardiovascular: Negative for chest pain, palpitations and leg swelling.  Gastrointestinal: Negative for nausea, diarrhea, blood in stool and abdominal distention.  Genitourinary: Negative for frequency and hematuria.  Musculoskeletal: Negative for back pain, joint swelling, gait problem and neck pain.  Skin: Negative for rash.  Neurological: Negative for dizziness, tremors, speech difficulty and weakness.  Psychiatric/Behavioral: Negative for suicidal ideas, sleep  disturbance, dysphoric mood and agitation. The patient is not nervous/anxious.     Objective:  BP 130/78 mmHg  Pulse 78  Wt 198 lb (89.812 kg)  SpO2 96%  BP Readings from Last 3 Encounters:  11/07/15 130/78  07/06/15 150/90  01/12/15 136/88    Wt Readings from Last 3 Encounters:  11/07/15 198 lb (89.812 kg)  07/06/15 195 lb (88.451 kg)  01/12/15 198 lb 4 oz (89.926 kg)    Physical Exam  Constitutional: He is oriented to person, place, and time. He appears well-developed. No distress.  NAD  HENT:  Mouth/Throat: Oropharynx is clear and moist.  Eyes: Conjunctivae are normal. Pupils are equal, round, and reactive to light.  Neck: Normal range of motion. No JVD present. No thyromegaly present.  Cardiovascular: Normal rate, regular rhythm, normal heart sounds and intact distal pulses.  Exam reveals no gallop and no friction rub.   No murmur heard. Pulmonary/Chest: Effort normal and breath sounds normal. No respiratory distress. He has no wheezes. He has no rales. He exhibits no tenderness.  Abdominal: Soft. Bowel sounds are normal. He exhibits no distension and no mass. There is no tenderness. There is no rebound and no guarding.  Musculoskeletal: Normal range of motion. He exhibits no edema or tenderness.  Lymphadenopathy:    He has no cervical adenopathy.  Neurological: He is alert and oriented to person, place, and time. He has normal reflexes. No cranial nerve deficit. He exhibits normal muscle tone. He displays a negative Romberg sign. Coordination and gait normal.  Skin: Skin is warm and dry. No rash noted.  Psychiatric: He has a  normal mood and affect. His behavior is normal. Judgment and thought content normal.    Lab Results  Component Value Date   WBC 7.6 01/12/2013   HGB 16.2 01/12/2013   HCT 47.1 01/12/2013   PLT 165.0 01/12/2013   GLUCOSE 163* 11/04/2015   CHOL 138 09/07/2014   TRIG 113.0 09/07/2014   HDL 27.80* 09/07/2014   LDLCALC 88 09/07/2014   ALT 32  09/07/2014   AST 19 09/07/2014   NA 142 11/04/2015   K 3.6 11/04/2015   CL 107 11/04/2015   CREATININE 0.81 11/04/2015   BUN 19 11/04/2015   CO2 28 11/04/2015   TSH 0.89 01/12/2013   PSA 2.31 05/24/2011   INR 1.1* 01/08/2012   HGBA1C 7.2* 11/04/2015    Dg Chest 2 View  07/12/2015  CLINICAL DATA:  Renal cell adenocarcinoma EXAM: CHEST  2 VIEW COMPARISON:  08/10/2013 FINDINGS: The heart size and mediastinal contours are within normal limits. Both lungs are clear. The visualized skeletal structures are unremarkable. IMPRESSION: No active cardiopulmonary disease. Electronically Signed   By: Skipper Cliche M.D.   On: 07/12/2015 08:10    Assessment & Plan:   Diagnoses and all orders for this visit:  Essential hypertension  DM type 2, controlled, with complication ([name] [name])  Primary osteoarthritis involving multiple joints  PSA, INCREASED  I have discontinued [name] clindamycin. I am also having him maintain his ONETOUCH VERIO, pantoprazole, cholecalciferol, olmesartan-hydrochlorothiazide, amLODipine, losartan-hydrochlorothiazide, and metFORMIN.  No orders of the defined types were placed in this encounter.     Follow-up: Return in about 4 months (around 03/06/2016) for a follow-up visit.  Walker Kehr, MD

## 2015-11-07 NOTE — Progress Notes (Signed)
Pre visit review using our clinic review tool, if applicable. No additional management support is needed unless otherwise documented below in the visit note. 

## 2015-11-07 NOTE — Assessment & Plan Note (Signed)
Benicar HCT; added Norvasc 

## 2015-11-07 NOTE — Assessment & Plan Note (Signed)
On Metformin 

## 2016-02-28 ENCOUNTER — Encounter: Payer: PRIVATE HEALTH INSURANCE | Admitting: Internal Medicine

## 2016-03-13 DIAGNOSIS — D3132 Benign neoplasm of left choroid: Secondary | ICD-10-CM | POA: Diagnosis not present

## 2016-03-13 DIAGNOSIS — E119 Type 2 diabetes mellitus without complications: Secondary | ICD-10-CM | POA: Diagnosis not present

## 2016-03-13 DIAGNOSIS — H25813 Combined forms of age-related cataract, bilateral: Secondary | ICD-10-CM | POA: Diagnosis not present

## 2016-03-13 DIAGNOSIS — D3131 Benign neoplasm of right choroid: Secondary | ICD-10-CM | POA: Diagnosis not present

## 2016-04-13 ENCOUNTER — Other Ambulatory Visit: Payer: Self-pay | Admitting: Internal Medicine

## 2016-06-04 ENCOUNTER — Encounter: Payer: Self-pay | Admitting: Internal Medicine

## 2016-06-04 ENCOUNTER — Ambulatory Visit (INDEPENDENT_AMBULATORY_CARE_PROVIDER_SITE_OTHER): Payer: BLUE CROSS/BLUE SHIELD | Admitting: Internal Medicine

## 2016-06-04 VITALS — BP 110/78 | HR 66 | Temp 98.5°F | Wt 191.0 lb

## 2016-06-04 DIAGNOSIS — S91341A Puncture wound with foreign body, right foot, initial encounter: Secondary | ICD-10-CM | POA: Diagnosis not present

## 2016-06-04 DIAGNOSIS — Z23 Encounter for immunization: Secondary | ICD-10-CM

## 2016-06-04 NOTE — Progress Notes (Signed)
   Subjective:  Patient ID: [name] [name], male    DOB: [birth year]  Age: 60 y.o. MRN: [id number]  CC: No chief complaint on file.   HPI [name] [name] presents for R heel puncture wound by a nail - stepped on the nail  Outpatient Medications Prior to Visit  Medication Sig Dispense Refill  . amLODipine (NORVASC) 5 MG tablet TAKE 1 TABLET (5 MG TOTAL) BY MOUTH DAILY. 30 tablet 5  . cholecalciferol (VITAMIN D) 1000 UNITS tablet Take 1,000 Units by mouth daily.    Marland Kitchen losartan-hydrochlorothiazide (HYZAAR) 100-25 MG tablet TAKE 1 TABLET BY MOUTH DAILY. 90 tablet 3  . metFORMIN (GLUCOPHAGE) 500 MG tablet TAKE 2 TABLETS TWICE A DAY WITH MEALS 120 tablet 5  . ONETOUCH VERIO test strip TEST UP TO TWO TIMES DAILY DX:250.0 100 each 3  . pantoprazole (PROTONIX) 40 MG tablet Take 1 tablet (40 mg total) by mouth daily. 30 tablet 11  . olmesartan-hydrochlorothiazide (BENICAR HCT) 40-25 MG per tablet Take 1 tablet by mouth daily. (Patient not taking: Reported on 06/04/2016) 30 tablet 0   No facility-administered medications prior to visit.     ROS Review of Systems  Constitutional: Negative for fever.  Musculoskeletal: Negative for arthralgias.  Skin: Positive for wound. Negative for color change.    Objective:  BP 110/78   Pulse 66   Temp 98.5 F (36.9 C)   Wt 191 lb (86.6 kg)   SpO2 97%   BMI 30.83 kg/m   BP Readings from Last 3 Encounters:  06/04/16 110/78  11/07/15 130/78  07/06/15 (!) 150/90    Wt Readings from Last 3 Encounters:  06/04/16 191 lb (86.6 kg)  11/07/15 198 lb (89.8 kg)  07/06/15 195 lb (88.5 kg)    Physical Exam  Skin: No erythema.   R heel puncture wound 3 mm Lab Results  Component Value Date   WBC 7.6 01/12/2013   HGB 16.2 01/12/2013   HCT 47.1 01/12/2013   PLT 165.0 01/12/2013   GLUCOSE 163 (H) 11/04/2015   CHOL 138 09/07/2014   TRIG 113.0 09/07/2014   HDL 27.80 (L) 09/07/2014   LDLCALC 88 09/07/2014   ALT 32 09/07/2014   AST 19 09/07/2014   NA 142 11/04/2015   K 3.6 11/04/2015   CL 107 11/04/2015   CREATININE 0.81 11/04/2015   BUN 19 11/04/2015   CO2 28 11/04/2015   TSH 0.89 01/12/2013   PSA 2.31 05/24/2011   INR 1.1 (H) 01/08/2012   HGBA1C 7.2 (H) 11/04/2015    Dg Chest 2 View  Result Date: 07/12/2015 CLINICAL DATA:  Renal cell adenocarcinoma EXAM: CHEST  2 VIEW COMPARISON:  08/10/2013 FINDINGS: The heart size and mediastinal contours are within normal limits. Both lungs are clear. The visualized skeletal structures are unremarkable. IMPRESSION: No active cardiopulmonary disease. Electronically Signed   By: Skipper Cliche M.D.   On: 07/12/2015 08:10    Assessment & Plan:   Diagnoses and all orders for this visit:  Need for Tdap vaccination -     Tdap vaccine greater than or equal to 7yo IM   I am having [name] [name] maintain his ONETOUCH VERIO, pantoprazole, cholecalciferol, olmesartan-hydrochlorothiazide, amLODipine, losartan-hydrochlorothiazide, and metFORMIN.  No orders of the defined types were placed in this encounter.    Follow-up: No Follow-up on file.  Walker Kehr, MD

## 2016-06-04 NOTE — Progress Notes (Signed)
Pre visit review using our clinic review tool, if applicable. No additional management support is needed unless otherwise documented below in the visit note. 

## 2016-06-21 ENCOUNTER — Other Ambulatory Visit (INDEPENDENT_AMBULATORY_CARE_PROVIDER_SITE_OTHER): Payer: BLUE CROSS/BLUE SHIELD

## 2016-06-21 DIAGNOSIS — E118 Type 2 diabetes mellitus with unspecified complications: Secondary | ICD-10-CM

## 2016-06-21 DIAGNOSIS — Z0000 Encounter for general adult medical examination without abnormal findings: Secondary | ICD-10-CM

## 2016-06-21 LAB — LIPID PANEL
Cholesterol: 144 mg/dL (ref 0–200)
HDL: 29.6 mg/dL — ABNORMAL LOW (ref >39.00)
LDL Cholesterol: 88 mg/dL (ref 0–99)
NonHDL: 114.37
Total CHOL/HDL Ratio: 5
Triglycerides: 133 mg/dL (ref 0.0–149.0)
VLDL: 26.6 mg/dL (ref 0.0–40.0)

## 2016-06-21 LAB — URINALYSIS
Bilirubin Urine: NEGATIVE
Hgb urine dipstick: NEGATIVE
Ketones, ur: NEGATIVE
Leukocytes, UA: NEGATIVE
Nitrite: NEGATIVE
Specific Gravity, Urine: 1.02 (ref 1.000–1.030)
Total Protein, Urine: NEGATIVE
Urine Glucose: 250 — AB
Urobilinogen, UA: 0.2 (ref 0.0–1.0)
pH: 6 (ref 5.0–8.0)

## 2016-06-21 LAB — TSH: TSH: 0.94 u[IU]/mL (ref 0.35–4.50)

## 2016-06-21 LAB — BASIC METABOLIC PANEL
BUN: 20 mg/dL (ref 6–23)
CO2: 31 mEq/L (ref 19–32)
Calcium: 9.4 mg/dL (ref 8.4–10.5)
Chloride: 103 mEq/L (ref 96–112)
Creatinine, Ser: 0.84 mg/dL (ref 0.40–1.50)
GFR: 98.44 mL/min (ref >60.00)
Glucose, Bld: 199 mg/dL — ABNORMAL HIGH (ref 70–99)
Potassium: 3.7 mEq/L (ref 3.5–5.1)
Sodium: 141 mEq/L (ref 135–145)

## 2016-06-21 LAB — CBC WITH DIFFERENTIAL/PLATELET
Basophils Absolute: 0 10*3/uL (ref 0.0–0.1)
Basophils Relative: 0.6 % (ref 0.0–3.0)
Eosinophils Absolute: 0.3 10*3/uL (ref 0.0–0.7)
Eosinophils Relative: 3.3 % (ref 0.0–5.0)
HCT: 50.7 % (ref 39.0–52.0)
Hemoglobin: 17.4 g/dL — ABNORMAL HIGH (ref 13.0–17.0)
Lymphocytes Relative: 17.4 % (ref 12.0–46.0)
Lymphs Abs: 1.4 10*3/uL (ref 0.7–4.0)
MCHC: 34.4 g/dL (ref 30.0–36.0)
MCV: 87.3 fl (ref 78.0–100.0)
Monocytes Absolute: 0.6 10*3/uL (ref 0.1–1.0)
Monocytes Relative: 7.9 % (ref 3.0–12.0)
Neutro Abs: 5.7 10*3/uL (ref 1.4–7.7)
Neutrophils Relative %: 70.8 % (ref 43.0–77.0)
Platelets: 164 10*3/uL (ref 150.0–400.0)
RBC: 5.81 Mil/uL (ref 4.22–5.81)
RDW: 14.1 % (ref 11.5–15.5)
WBC: 8 10*3/uL (ref 4.0–10.5)

## 2016-06-21 LAB — HEPATIC FUNCTION PANEL
ALT: 28 U/L (ref 0–53)
AST: 14 U/L (ref 0–37)
Albumin: 4.2 g/dL (ref 3.5–5.2)
Alkaline Phosphatase: 106 U/L (ref 39–117)
Bilirubin, Direct: 0.2 mg/dL (ref 0.0–0.3)
Total Bilirubin: 0.8 mg/dL (ref 0.2–1.2)
Total Protein: 6.6 g/dL (ref 6.0–8.3)

## 2016-06-21 LAB — PSA: PSA: 2.92 ng/mL (ref 0.10–4.00)

## 2016-06-21 LAB — HEMOGLOBIN A1C: Hgb A1c MFr Bld: 7.7 % — ABNORMAL HIGH (ref 4.6–6.5)

## 2016-06-22 ENCOUNTER — Ambulatory Visit (INDEPENDENT_AMBULATORY_CARE_PROVIDER_SITE_OTHER): Payer: PRIVATE HEALTH INSURANCE | Admitting: Internal Medicine

## 2016-06-22 ENCOUNTER — Encounter: Payer: Self-pay | Admitting: Internal Medicine

## 2016-06-22 VITALS — BP 122/80 | HR 69 | Ht 66.0 in | Wt 189.0 lb

## 2016-06-22 DIAGNOSIS — E118 Type 2 diabetes mellitus with unspecified complications: Secondary | ICD-10-CM

## 2016-06-22 DIAGNOSIS — E278 Other specified disorders of adrenal gland: Secondary | ICD-10-CM

## 2016-06-22 DIAGNOSIS — Z0000 Encounter for general adult medical examination without abnormal findings: Secondary | ICD-10-CM | POA: Diagnosis not present

## 2016-06-22 DIAGNOSIS — D751 Secondary polycythemia: Secondary | ICD-10-CM

## 2016-06-22 DIAGNOSIS — D45 Polycythemia vera: Secondary | ICD-10-CM

## 2016-06-22 DIAGNOSIS — I10 Essential (primary) hypertension: Secondary | ICD-10-CM

## 2016-06-22 DIAGNOSIS — N2889 Other specified disorders of kidney and ureter: Secondary | ICD-10-CM

## 2016-06-22 LAB — HEPATITIS C ANTIBODY: HCV Ab: NEGATIVE

## 2016-06-22 MED ORDER — REPAGLINIDE 1 MG PO TABS: 1.0000 mg | ORAL_TABLET | Freq: Two times a day (BID) | ORAL | 11 refills | Status: DC

## 2016-06-22 NOTE — Progress Notes (Signed)
Pre visit review using our clinic review tool, if applicable. No additional management support is needed unless otherwise documented below in the visit note. 

## 2016-06-22 NOTE — Assessment & Plan Note (Signed)
We discussed age appropriate health related issues, including available/recomended screening tests and vaccinations. We discussed a need for adhering to healthy diet and exercise. Labs/EKG were reviewed/ordered. All questions were answered.   

## 2016-06-22 NOTE — Assessment & Plan Note (Addendum)
The pt stopped Metformin due to the news Labs

## 2016-06-22 NOTE — Assessment & Plan Note (Signed)
S/p R partial nephrectomy for renal cell cancer 10/13 Dr Wrenn 

## 2016-06-22 NOTE — Assessment & Plan Note (Signed)
Stop smoking - monitor CBC

## 2016-06-22 NOTE — Assessment & Plan Note (Signed)
10/13 Dr Alinda Money  S/p partial R nephrectomy - renal cell ca

## 2016-06-22 NOTE — Assessment & Plan Note (Signed)
Benicar HCT; Norvasc 

## 2016-06-22 NOTE — Assessment & Plan Note (Signed)
9/17 due to smoking  Denies OSA Discussed

## 2016-06-22 NOTE — Progress Notes (Signed)
Subjective:  Patient ID: [name] [name], male    DOB: 1956-06-08  Age: 60 y.o. MRN: [id number]  CC: Annual Exam   HPI [name] [name] presents for a well exam F/u DM, HTN, GERD. He stopped Metformin  Outpatient Medications Prior to Visit  Medication Sig Dispense Refill  . amLODipine (NORVASC) 5 MG tablet TAKE 1 TABLET (5 MG TOTAL) BY MOUTH DAILY. 30 tablet 5  . cholecalciferol (VITAMIN D) 1000 UNITS tablet Take 1,000 Units by mouth daily.    Marland Kitchen losartan-hydrochlorothiazide (HYZAAR) 100-25 MG tablet TAKE 1 TABLET BY MOUTH DAILY. 90 tablet 3  . metFORMIN (GLUCOPHAGE) 500 MG tablet TAKE 2 TABLETS TWICE A DAY WITH MEALS 120 tablet 5  . olmesartan-hydrochlorothiazide (BENICAR HCT) 40-25 MG per tablet Take 1 tablet by mouth daily. 30 tablet 0  . ONETOUCH VERIO test strip TEST UP TO TWO TIMES DAILY DX:250.0 100 each 3  . pantoprazole (PROTONIX) 40 MG tablet Take 1 tablet (40 mg total) by mouth daily. 30 tablet 11   No facility-administered medications prior to visit.     ROS Review of Systems  Constitutional: Negative for appetite change, fatigue and unexpected weight change.  HENT: Negative for congestion, nosebleeds, sneezing, sore throat and trouble swallowing.   Eyes: Negative for itching and visual disturbance.  Respiratory: Negative for cough.   Cardiovascular: Negative for chest pain, palpitations and leg swelling.  Gastrointestinal: Negative for abdominal distention, blood in stool, diarrhea and nausea.  Genitourinary: Negative for frequency and hematuria.  Musculoskeletal: Negative for back pain, gait problem, joint swelling and neck pain.  Skin: Negative for rash.  Neurological: Negative for dizziness, tremors, speech difficulty and weakness.  Psychiatric/Behavioral: Negative for agitation, dysphoric mood and sleep disturbance. The patient is not nervous/anxious.     Objective:  BP 122/80   Pulse 69   Ht 5\' 6"  (1.676 m)   Wt 189 lb (85.7 kg)   SpO2 96%   BMI 30.51  kg/m   BP Readings from Last 3 Encounters:  06/22/16 122/80  06/04/16 110/78  11/07/15 130/78    Wt Readings from Last 3 Encounters:  06/22/16 189 lb (85.7 kg)  06/04/16 191 lb (86.6 kg)  11/07/15 198 lb (89.8 kg)    Physical Exam  Constitutional: He is oriented to person, place, and time. He appears well-developed and well-nourished. No distress.  HENT:  Head: Normocephalic and atraumatic.  Right Ear: External ear normal.  Left Ear: External ear normal.  Nose: Nose normal.  Mouth/Throat: Oropharynx is clear and moist. No oropharyngeal exudate.  Eyes: Conjunctivae and EOM are normal. Pupils are equal, round, and reactive to light. Right eye exhibits no discharge. Left eye exhibits no discharge. No scleral icterus.  Neck: Normal range of motion. Neck supple. No JVD present. No tracheal deviation present. No thyromegaly present.  Cardiovascular: Normal rate, regular rhythm, normal heart sounds and intact distal pulses.  Exam reveals no gallop and no friction rub.   No murmur heard. Pulmonary/Chest: Effort normal and breath sounds normal. No stridor. No respiratory distress. He has no wheezes. He has no rales. He exhibits no tenderness.  Abdominal: Soft. Bowel sounds are normal. He exhibits no distension and no mass. There is no tenderness. There is no rebound and no guarding.  Genitourinary: Rectum normal and penis normal. Rectal exam shows guaiac negative stool. No penile tenderness.  Musculoskeletal: Normal range of motion. He exhibits no edema or tenderness.  Lymphadenopathy:    He has no cervical adenopathy.  Neurological: He is  alert and oriented to person, place, and time. He has normal reflexes. No cranial nerve deficit. He exhibits normal muscle tone. Coordination normal.  Skin: Skin is warm and dry. No rash noted. He is not diaphoretic. No erythema. No pallor.  Psychiatric: He has a normal mood and affect. His behavior is normal. Judgment and thought content normal.  G (-)  stool Prostate 1+  Lab Results  Component Value Date   WBC 8.0 06/21/2016   HGB 17.4 (H) 06/21/2016   HCT 50.7 06/21/2016   PLT 164.0 06/21/2016   GLUCOSE 199 (H) 06/21/2016   CHOL 144 06/21/2016   TRIG 133.0 06/21/2016   HDL 29.60 (L) 06/21/2016   LDLCALC 88 06/21/2016   ALT 28 06/21/2016   AST 14 06/21/2016   NA 141 06/21/2016   K 3.7 06/21/2016   CL 103 06/21/2016   CREATININE 0.84 06/21/2016   BUN 20 06/21/2016   CO2 31 06/21/2016   TSH 0.94 06/21/2016   PSA 2.92 06/21/2016   INR 1.1 (H) 01/08/2012   HGBA1C 7.7 (H) 06/21/2016    Dg Chest 2 View  Result Date: 07/12/2015 CLINICAL DATA:  Renal cell adenocarcinoma EXAM: CHEST  2 VIEW COMPARISON:  08/10/2013 FINDINGS: The heart size and mediastinal contours are within normal limits. Both lungs are clear. The visualized skeletal structures are unremarkable. IMPRESSION: No active cardiopulmonary disease. Electronically Signed   By: Skipper Cliche M.D.   On: 07/12/2015 08:10    Assessment & Plan:   There are no diagnoses linked to this encounter. I am having Mr. Virden maintain his ONETOUCH VERIO, pantoprazole, cholecalciferol, olmesartan-hydrochlorothiazide, amLODipine, losartan-hydrochlorothiazide, and metFORMIN.  No orders of the defined types were placed in this encounter.    Follow-up: No Follow-up on file.  Walker Kehr, MD

## 2016-07-19 ENCOUNTER — Other Ambulatory Visit: Payer: Self-pay | Admitting: Internal Medicine

## 2016-09-02 ENCOUNTER — Other Ambulatory Visit: Payer: Self-pay | Admitting: Internal Medicine

## 2016-09-14 DIAGNOSIS — H25813 Combined forms of age-related cataract, bilateral: Secondary | ICD-10-CM | POA: Diagnosis not present

## 2016-09-14 DIAGNOSIS — D3131 Benign neoplasm of right choroid: Secondary | ICD-10-CM | POA: Diagnosis not present

## 2016-09-14 DIAGNOSIS — E119 Type 2 diabetes mellitus without complications: Secondary | ICD-10-CM | POA: Diagnosis not present

## 2016-09-14 DIAGNOSIS — D3132 Benign neoplasm of left choroid: Secondary | ICD-10-CM | POA: Diagnosis not present

## 2016-09-19 ENCOUNTER — Telehealth: Payer: Self-pay | Admitting: Internal Medicine

## 2016-09-19 NOTE — Telephone Encounter (Signed)
Patient Name: [name] [name]  DOB: [birth year]    Initial Comment Caller states wants appt; having lower back problems; could hardly straighten this morning;    Nurse Assessment      Guidelines    Guideline Title Affirmed Question Affirmed Notes       Final Disposition User   FINAL ATTEMPT MADE - message left Raphael Gibney, Therapist, sports, Vanita Ingles

## 2016-09-20 ENCOUNTER — Ambulatory Visit (INDEPENDENT_AMBULATORY_CARE_PROVIDER_SITE_OTHER): Payer: BLUE CROSS/BLUE SHIELD | Admitting: Internal Medicine

## 2016-09-20 ENCOUNTER — Encounter: Payer: Self-pay | Admitting: Internal Medicine

## 2016-09-20 VITALS — BP 130/60 | HR 75 | Wt 195.0 lb

## 2016-09-20 DIAGNOSIS — M545 Low back pain, unspecified: Secondary | ICD-10-CM

## 2016-09-20 DIAGNOSIS — I10 Essential (primary) hypertension: Secondary | ICD-10-CM | POA: Diagnosis not present

## 2016-09-20 DIAGNOSIS — E118 Type 2 diabetes mellitus with unspecified complications: Secondary | ICD-10-CM | POA: Diagnosis not present

## 2016-09-20 MED ORDER — HYDROCODONE-ACETAMINOPHEN 7.5-325 MG PO TABS: 1.0000 | ORAL_TABLET | ORAL | 0 refills | Status: DC | PRN

## 2016-09-20 NOTE — Progress Notes (Signed)
Subjective:  Patient ID: [name] [name], male    DOB: 02-Nov-1955  Age: 60 y.o. MRN: [id number]  CC: No chief complaint on file.   Back Pain  This is a new problem. The problem occurs constantly. The problem has been gradually worsening since onset. The pain is present in the lumbar spine. The quality of the pain is described as burning. The pain does not radiate. The pain is at a severity of 7/10. The pain is moderate. The symptoms are aggravated by bending, position, standing and twisting. Stiffness is present in the morning. Pertinent negatives include no chest pain, perianal numbness, tingling, weakness or weight loss. He has tried bed rest, home exercises and NSAIDs for the symptoms. The treatment provided no relief.   [name] [name] presents for HTN f/u, GERD, OA C/o stress - wife was dx'd w/lung cancer - s/p lobectomy C/o LBP  Outpatient Medications Prior to Visit  Medication Sig Dispense Refill  . amLODipine (NORVASC) 5 MG tablet TAKE 1 TABLET (5 MG TOTAL) BY MOUTH DAILY. 30 tablet 5  . cholecalciferol (VITAMIN D) 1000 UNITS tablet Take 1,000 Units by mouth daily.    Marland Kitchen losartan-hydrochlorothiazide (HYZAAR) 100-25 MG tablet TAKE 1 TABLET BY MOUTH DAILY. 90 tablet 3  . ONETOUCH VERIO test strip TEST UP TO TWO TIMES DAILY DX:250.0 100 each 3  . pantoprazole (PROTONIX) 40 MG tablet Take 1 tablet (40 mg total) by mouth daily. 30 tablet 11  . repaglinide (PRANDIN) 1 MG tablet Take 1 tablet (1 mg total) by mouth 2 (two) times daily before a meal. 60 tablet 11  . olmesartan-hydrochlorothiazide (BENICAR HCT) 40-25 MG per tablet Take 1 tablet by mouth daily. 30 tablet 0   No facility-administered medications prior to visit.     ROS Review of Systems  Constitutional: Negative for appetite change, fatigue, unexpected weight change and weight loss.  HENT: Negative for congestion, nosebleeds, sneezing, sore throat and trouble swallowing.   Eyes: Negative for itching and visual  disturbance.  Respiratory: Negative for cough.   Cardiovascular: Negative for chest pain, palpitations and leg swelling.  Gastrointestinal: Negative for abdominal distention, blood in stool, diarrhea and nausea.  Genitourinary: Negative for frequency and hematuria.  Musculoskeletal: Positive for back pain. Negative for gait problem, joint swelling and neck pain.  Skin: Negative for rash.  Neurological: Negative for dizziness, tingling, tremors, speech difficulty and weakness.  Psychiatric/Behavioral: Negative for agitation, dysphoric mood, sleep disturbance and suicidal ideas. The patient is not nervous/anxious.     Objective:  BP 130/60   Pulse 75   Wt 195 lb (88.5 kg)   SpO2 97%   BMI 31.47 kg/m   BP Readings from Last 3 Encounters:  09/20/16 130/60  06/22/16 122/80  06/04/16 110/78    Wt Readings from Last 3 Encounters:  09/20/16 195 lb (88.5 kg)  06/22/16 189 lb (85.7 kg)  06/04/16 191 lb (86.6 kg)    Physical Exam  Constitutional: He is oriented to person, place, and time. He appears well-developed. No distress.  NAD  HENT:  Mouth/Throat: Oropharynx is clear and moist.  Eyes: Conjunctivae are normal. Pupils are equal, round, and reactive to light.  Neck: Normal range of motion. No JVD present. No thyromegaly present.  Cardiovascular: Normal rate, regular rhythm, normal heart sounds and intact distal pulses.  Exam reveals no gallop and no friction rub.   No murmur heard. Pulmonary/Chest: Effort normal and breath sounds normal. No respiratory distress. He has no wheezes. He has no rales.  He exhibits no tenderness.  Abdominal: Soft. Bowel sounds are normal. He exhibits no distension and no mass. There is no tenderness. There is no rebound and no guarding.  Musculoskeletal: Normal range of motion. He exhibits no edema.  Lymphadenopathy:    He has no cervical adenopathy.  Neurological: He is alert and oriented to person, place, and time. He has normal reflexes. No cranial  nerve deficit. He exhibits normal muscle tone. He displays a negative Romberg sign. Coordination and gait normal.  Skin: Skin is warm and dry. No rash noted.  Psychiatric: He has a normal mood and affect. His behavior is normal. Judgment and thought content normal.  LS tender Str leg (-) B  Lab Results  Component Value Date   WBC 8.0 06/21/2016   HGB 17.4 (H) 06/21/2016   HCT 50.7 06/21/2016   PLT 164.0 06/21/2016   GLUCOSE 199 (H) 06/21/2016   CHOL 144 06/21/2016   TRIG 133.0 06/21/2016   HDL 29.60 (L) 06/21/2016   LDLCALC 88 06/21/2016   ALT 28 06/21/2016   AST 14 06/21/2016   NA 141 06/21/2016   K 3.7 06/21/2016   CL 103 06/21/2016   CREATININE 0.84 06/21/2016   BUN 20 06/21/2016   CO2 31 06/21/2016   TSH 0.94 06/21/2016   PSA 2.92 06/21/2016   INR 1.1 (H) 01/08/2012   HGBA1C 7.7 (H) 06/21/2016    Dg Chest 2 View  Result Date: 07/12/2015 CLINICAL DATA:  Renal cell adenocarcinoma EXAM: CHEST  2 VIEW COMPARISON:  08/10/2013 FINDINGS: The heart size and mediastinal contours are within normal limits. Both lungs are clear. The visualized skeletal structures are unremarkable. IMPRESSION: No active cardiopulmonary disease. Electronically Signed   By: Skipper Cliche M.D.   On: 07/12/2015 08:10    Assessment & Plan:   There are no diagnoses linked to this encounter. I have discontinued Mr. Orahood olmesartan-hydrochlorothiazide. I am also having him maintain his ONETOUCH VERIO, pantoprazole, cholecalciferol, repaglinide, amLODipine, and losartan-hydrochlorothiazide.  No orders of the defined types were placed in this encounter.    Follow-up: No Follow-up on file.  Walker Kehr, MD

## 2016-09-20 NOTE — Patient Instructions (Signed)

## 2016-09-20 NOTE — Progress Notes (Signed)
Pre visit review using our clinic review tool, if applicable. No additional management support is needed unless otherwise documented below in the visit note. 

## 2016-09-20 NOTE — Assessment & Plan Note (Signed)
BP Readings from Last 3 Encounters:  09/20/16 130/60  06/22/16 122/80  06/04/16 110/78  Benicar HCT, Norvasc

## 2016-09-20 NOTE — Assessment & Plan Note (Signed)
Labs

## 2016-09-20 NOTE — Assessment & Plan Note (Signed)
norco prn  Potential benefits ofa long term opioids use as well as potential risks (i.e. addiction risk, apnea etc) and complications (i.e. Somnolence, constipation and others) were explained to the patient and were aknowledged. Stretch Off work X ray

## 2016-09-24 ENCOUNTER — Ambulatory Visit (INDEPENDENT_AMBULATORY_CARE_PROVIDER_SITE_OTHER)
Admission: RE | Admit: 2016-09-24 | Discharge: 2016-09-24 | Disposition: A | Discharge status: Home / Self Care | Payer: BLUE CROSS/BLUE SHIELD | Source: Ambulatory Visit | Attending: Internal Medicine | Admitting: Internal Medicine

## 2016-09-24 ENCOUNTER — Ambulatory Visit: Payer: PRIVATE HEALTH INSURANCE | Admitting: Internal Medicine

## 2016-09-24 ENCOUNTER — Telehealth: Payer: Self-pay | Admitting: *Deleted

## 2016-09-24 DIAGNOSIS — M545 Low back pain: Secondary | ICD-10-CM

## 2016-09-24 NOTE — Telephone Encounter (Signed)
Pt informed of his L spine xray results. He still has not returned to work. He is requesting a rx for a back brace. Please advise.

## 2016-09-25 NOTE — Telephone Encounter (Signed)
Ok Pls do Thx 

## 2016-09-25 NOTE — Telephone Encounter (Signed)
Patient is follow up on request for back brace.   He also wants to know work restrictions, and would like a note writing him out of work through today 12/12.

## 2016-09-26 NOTE — Telephone Encounter (Signed)
[name] [name] °

## 2016-09-27 NOTE — Telephone Encounter (Signed)
I called pt back- PCP has not given me Rx for back brace yet.   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

## 2016-09-27 NOTE — Telephone Encounter (Signed)
Patient called back. States that the first day he missed work was on Dec 6th he came in to the office on Dec 7th. He states he wont be going back to work until the Monday at less. He states he really needs to talk to you. He states he think he is just disabled now. He does want to go back to work on pain medication. Can you please follow up with him. Thank you.

## 2016-09-27 NOTE — Telephone Encounter (Signed)
Pt called in to check the status of this note?  Can you call him when you get a chance ?

## 2016-09-28 ENCOUNTER — Telehealth: Payer: Self-pay | Admitting: Internal Medicine

## 2016-09-28 NOTE — Telephone Encounter (Signed)
Pt called in and has questions about the note he came and picked up today.  He stated that he is not going back to work until Monday.

## 2016-09-28 NOTE — Telephone Encounter (Signed)
OOW note and written Rx for back brace are upfront for pt to p/u. Pt informed. He states he now has STD papers for PCP to complete. He will leave those when he comes to pick up rx and OOW note.

## 2016-09-28 NOTE — Telephone Encounter (Signed)
Dr. Irene Pap will complete OOW note for 12/6- 12/18.  Please give me a written rx for a back brace for pt.

## 2016-10-02 ENCOUNTER — Telehealth: Payer: Self-pay | Admitting: Internal Medicine

## 2016-10-02 NOTE — Telephone Encounter (Signed)
All STD forms and patient's notes have been given to PCP for review.

## 2016-10-02 NOTE — Telephone Encounter (Signed)
Left detailed mess informing pt.  

## 2016-10-02 NOTE — Telephone Encounter (Signed)
Pt called in and said that he wanted to know the update on his short term disability.  He wants to know if he needs to come in and be seen for the paperwork to be filled out?  He also needs a refill on his pain meds  Best number [phone]

## 2016-10-03 ENCOUNTER — Telehealth: Payer: Self-pay | Admitting: *Deleted

## 2016-10-03 MED ORDER — HYDROCODONE-ACETAMINOPHEN 7.5-325 MG PO TABS: 1.0000 | ORAL_TABLET | ORAL | 0 refills | Status: DC | PRN

## 2016-10-03 NOTE — Telephone Encounter (Signed)
Ok to ref pain Rx I have not seen paperwork yet Thx

## 2016-10-03 NOTE — Telephone Encounter (Signed)
Ok to ref [employer]

## 2016-10-03 NOTE — Telephone Encounter (Signed)
Pt left msg on triage needing refill on Hydrocodone...[name]

## 2016-10-03 NOTE — Telephone Encounter (Signed)
Hydrocodone Rx printed. Pending MD sig.

## 2016-10-04 NOTE — Telephone Encounter (Signed)
OOW dates are from 09/19/16 - to be determined. FMLA and STD forms given back to PCP with dates.

## 2016-10-04 NOTE — Telephone Encounter (Signed)
Pt has been made aware rx is waiting for pick-up.../lm,b

## 2016-10-05 DIAGNOSIS — Z0289 Encounter for other administrative examinations: Secondary | ICD-10-CM

## 2016-10-05 NOTE — Telephone Encounter (Signed)
sch OV pls. Forms were completed Thx

## 2016-10-05 NOTE — Telephone Encounter (Signed)
Pt informed. OV scheduled 10/17/16 @ 3:45. Forms are at my desk for pt's signature at his upcoming appt with PCP.

## 2016-10-12 ENCOUNTER — Other Ambulatory Visit: Payer: BLUE CROSS/BLUE SHIELD

## 2016-10-17 ENCOUNTER — Encounter: Payer: Self-pay | Admitting: Internal Medicine

## 2016-10-17 ENCOUNTER — Ambulatory Visit (INDEPENDENT_AMBULATORY_CARE_PROVIDER_SITE_OTHER): Payer: BLUE CROSS/BLUE SHIELD | Admitting: Internal Medicine

## 2016-10-17 DIAGNOSIS — M5442 Lumbago with sciatica, left side: Secondary | ICD-10-CM | POA: Diagnosis not present

## 2016-10-17 DIAGNOSIS — F17200 Nicotine dependence, unspecified, uncomplicated: Secondary | ICD-10-CM

## 2016-10-17 DIAGNOSIS — D45 Polycythemia vera: Secondary | ICD-10-CM | POA: Diagnosis not present

## 2016-10-17 DIAGNOSIS — E118 Type 2 diabetes mellitus with unspecified complications: Secondary | ICD-10-CM

## 2016-10-17 DIAGNOSIS — G8929 Other chronic pain: Secondary | ICD-10-CM

## 2016-10-17 MED ORDER — HYDROCODONE-ACETAMINOPHEN 7.5-325 MG PO TABS: 1.0000 | ORAL_TABLET | ORAL | 0 refills | Status: DC | PRN

## 2016-10-17 NOTE — Assessment & Plan Note (Signed)
quit smoking in 12/17

## 2016-10-17 NOTE — Assessment & Plan Note (Signed)
CBC - quit smoking in 12/17

## 2016-10-17 NOTE — Assessment & Plan Note (Signed)
12/17 L>>R worse Start PT MRI LS Spine Remain off work

## 2016-10-17 NOTE — Progress Notes (Signed)
Pre visit review using our clinic review tool, if applicable. No additional management support is needed unless otherwise documented below in the visit note. 

## 2016-10-17 NOTE — Progress Notes (Signed)
Subjective:  Patient ID: [name] [name], male    DOB: 28-May-1956  Age: 61 y.o. MRN: [id number]  CC: No chief complaint on file.   Back Pain  This is a chronic problem. The current episode started more than 1 month ago (>6 mo). The problem occurs constantly. The problem is unchanged. The pain is present in the lumbar spine and sacro-iliac. The quality of the pain is described as aching. The pain radiates to the left thigh. The pain is at a severity of 7/10. The pain is severe. The pain is worse during the day. The symptoms are aggravated by bending, sitting, standing, position and twisting. Pertinent negatives include no bladder incontinence, bowel incontinence, chest pain, leg pain, numbness, weakness or weight loss. Risk factors include history of cancer and lack of exercise. He has tried ice, heat and bed rest for the symptoms. The treatment provided no relief.  F/u DM, stress w/wife [name] [name] presents for LBP  Outpatient Medications Prior to Visit  Medication Sig Dispense Refill  . amLODipine (NORVASC) 5 MG tablet TAKE 1 TABLET (5 MG TOTAL) BY MOUTH DAILY. 30 tablet 5  . cholecalciferol (VITAMIN D) 1000 UNITS tablet Take 1,000 Units by mouth daily.    Marland Kitchen HYDROcodone-acetaminophen (NORCO) 7.5-325 MG tablet Take 1 tablet by mouth every 4 (four) hours as needed for moderate pain. 60 tablet 0  . losartan-hydrochlorothiazide (HYZAAR) 100-25 MG tablet TAKE 1 TABLET BY MOUTH DAILY. 90 tablet 3  . ONETOUCH VERIO test strip TEST UP TO TWO TIMES DAILY DX:250.0 100 each 3  . repaglinide (PRANDIN) 1 MG tablet Take 1 tablet (1 mg total) by mouth 2 (two) times daily before a meal. 60 tablet 11  . pantoprazole (PROTONIX) 40 MG tablet Take 1 tablet (40 mg total) by mouth daily. (Patient not taking: Reported on 10/17/2016) 30 tablet 11   No facility-administered medications prior to visit.     ROS Review of Systems  Constitutional: Negative for appetite change, fatigue, unexpected weight change  and weight loss.  HENT: Negative for congestion, nosebleeds, sneezing, sore throat and trouble swallowing.   Eyes: Negative for itching and visual disturbance.  Respiratory: Negative for cough.   Cardiovascular: Negative for chest pain, palpitations and leg swelling.  Gastrointestinal: Negative for abdominal distention, blood in stool, bowel incontinence, diarrhea and nausea.  Genitourinary: Negative for bladder incontinence, frequency and hematuria.  Musculoskeletal: Positive for back pain and gait problem. Negative for joint swelling and neck pain.  Skin: Negative for rash.  Neurological: Negative for dizziness, tremors, speech difficulty, weakness and numbness.  Psychiatric/Behavioral: Negative for agitation, dysphoric mood and sleep disturbance. The patient is not nervous/anxious.     Objective:  BP 130/78 (BP Location: Left Arm, Patient Position: Sitting, Cuff Size: Normal)   Pulse (!) 59   Temp 98 F (36.7 C) (Oral)   Resp 14   Ht 5\' 6"  (1.676 m)   Wt 194 lb 8 oz (88.2 kg)   SpO2 99%   BMI 31.39 kg/m   BP Readings from Last 3 Encounters:  10/17/16 130/78  09/20/16 130/60  06/22/16 122/80    Wt Readings from Last 3 Encounters:  10/17/16 194 lb 8 oz (88.2 kg)  09/20/16 195 lb (88.5 kg)  06/22/16 189 lb (85.7 kg)    Physical Exam  Constitutional: He is oriented to person, place, and time. He appears well-developed. No distress.  NAD  HENT:  Mouth/Throat: Oropharynx is clear and moist.  Eyes: Conjunctivae are normal. Pupils are equal,  round, and reactive to light.  Neck: Normal range of motion. No JVD present. No thyromegaly present.  Cardiovascular: Normal rate, regular rhythm, normal heart sounds and intact distal pulses.  Exam reveals no gallop and no friction rub.   No murmur heard. Pulmonary/Chest: Effort normal and breath sounds normal. No respiratory distress. He has no wheezes. He has no rales. He exhibits no tenderness.  Abdominal: Soft. Bowel sounds are  normal. He exhibits no distension and no mass. There is no tenderness. There is no rebound and no guarding.  Musculoskeletal: Normal range of motion. He exhibits tenderness. He exhibits no edema.  Lymphadenopathy:    He has no cervical adenopathy.  Neurological: He is alert and oriented to person, place, and time. He has normal reflexes. No cranial nerve deficit. He exhibits normal muscle tone. He displays a negative Romberg sign. Coordination and gait normal.  Skin: Skin is warm and dry. No rash noted.  Psychiatric: He has a normal mood and affect. His behavior is normal. Judgment and thought content normal.  LS tender L leg elev is (+)   Lab Results  Component Value Date   WBC 8.0 06/21/2016   HGB 17.4 (H) 06/21/2016   HCT 50.7 06/21/2016   PLT 164.0 06/21/2016   GLUCOSE 199 (H) 06/21/2016   CHOL 144 06/21/2016   TRIG 133.0 06/21/2016   HDL 29.60 (L) 06/21/2016   LDLCALC 88 06/21/2016   ALT 28 06/21/2016   AST 14 06/21/2016   NA 141 06/21/2016   K 3.7 06/21/2016   CL 103 06/21/2016   CREATININE 0.84 06/21/2016   BUN 20 06/21/2016   CO2 31 06/21/2016   TSH 0.94 06/21/2016   PSA 2.92 06/21/2016   INR 1.1 (H) 01/08/2012   HGBA1C 7.7 (H) 06/21/2016    Dg Lumbar Spine 2-3 Views  Result Date: 09/24/2016 CLINICAL DATA:  Low back pain for the past 5 days without known injury EXAM: LUMBAR SPINE - 2-3 VIEW COMPARISON:  Coronal and sagittal images from an abdominal and pelvic CT scan of December 27, 2014 FINDINGS: The lumbar vertebral bodies are preserved in height. The pedicles and transverse processes are intact. There are anterior bridging osteophytes at L3-4 and L4-5. There is no spondylolisthesis. There is no significant disc space narrowing. There is no significant facet joint hypertrophy. IMPRESSION: Spondylosis. No compression fracture, spondylolisthesis, nor disc space narrowing. If there are radicular symptoms, lumbar spine MRI may be useful. Electronically Signed   By: David   Martinique M.D.   On: 09/24/2016 09:56    Assessment & Plan:   There are no diagnoses linked to this encounter. I am having Mr. Hocevar maintain his ONETOUCH VERIO, pantoprazole, cholecalciferol, repaglinide, amLODipine, losartan-hydrochlorothiazide, and HYDROcodone-acetaminophen.  No orders of the defined types were placed in this encounter.    Follow-up: No Follow-up on file.  Walker Kehr, MD

## 2016-10-17 NOTE — Assessment & Plan Note (Signed)
Labs

## 2016-10-19 ENCOUNTER — Encounter: Payer: Self-pay | Admitting: Internal Medicine

## 2016-10-19 ENCOUNTER — Telehealth: Payer: Self-pay | Admitting: Internal Medicine

## 2016-10-19 NOTE — Telephone Encounter (Signed)
Completed forms were faxed on 10/17/16 and 10/19/16. Pt informed by Dartha Lodge., front office team lead.

## 2016-10-19 NOTE — Telephone Encounter (Signed)
It was done ?Thx ?

## 2016-10-19 NOTE — Telephone Encounter (Signed)
Pt called in said that the short term disability paperwork is due today.  But 56 today or he will got be about to get it

## 2016-10-29 ENCOUNTER — Ambulatory Visit
Admission: RE | Admit: 2016-10-29 | Discharge: 2016-10-29 | Disposition: A | Discharge status: Home / Self Care | Payer: BLUE CROSS/BLUE SHIELD | Source: Ambulatory Visit | Attending: Internal Medicine | Admitting: Internal Medicine

## 2016-10-29 DIAGNOSIS — M5442 Lumbago with sciatica, left side: Principal | ICD-10-CM

## 2016-10-29 DIAGNOSIS — M48061 Spinal stenosis, lumbar region without neurogenic claudication: Secondary | ICD-10-CM | POA: Diagnosis not present

## 2016-10-29 DIAGNOSIS — G8929 Other chronic pain: Secondary | ICD-10-CM

## 2016-11-01 ENCOUNTER — Ambulatory Visit: Payer: BLUE CROSS/BLUE SHIELD | Admitting: Internal Medicine

## 2016-11-13 ENCOUNTER — Encounter: Payer: Self-pay | Admitting: Internal Medicine

## 2016-11-13 ENCOUNTER — Other Ambulatory Visit (INDEPENDENT_AMBULATORY_CARE_PROVIDER_SITE_OTHER): Payer: BLUE CROSS/BLUE SHIELD

## 2016-11-13 ENCOUNTER — Ambulatory Visit (INDEPENDENT_AMBULATORY_CARE_PROVIDER_SITE_OTHER): Payer: BLUE CROSS/BLUE SHIELD | Admitting: Internal Medicine

## 2016-11-13 VITALS — BP 130/78 | HR 58 | Temp 98.5°F | Resp 14 | Ht 66.0 in | Wt 191.4 lb

## 2016-11-13 DIAGNOSIS — D45 Polycythemia vera: Secondary | ICD-10-CM

## 2016-11-13 DIAGNOSIS — M159 Polyosteoarthritis, unspecified: Secondary | ICD-10-CM

## 2016-11-13 DIAGNOSIS — M150 Primary generalized (osteo)arthritis: Secondary | ICD-10-CM | POA: Diagnosis not present

## 2016-11-13 DIAGNOSIS — M5442 Lumbago with sciatica, left side: Secondary | ICD-10-CM

## 2016-11-13 DIAGNOSIS — G8929 Other chronic pain: Secondary | ICD-10-CM

## 2016-11-13 DIAGNOSIS — M8949 Other hypertrophic osteoarthropathy, multiple sites: Secondary | ICD-10-CM

## 2016-11-13 LAB — CBC WITH DIFFERENTIAL/PLATELET
Basophils Absolute: 0 10*3/uL (ref 0.0–0.1)
Basophils Relative: 0.5 % (ref 0.0–3.0)
Eosinophils Absolute: 0.2 10*3/uL (ref 0.0–0.7)
Eosinophils Relative: 3.2 % (ref 0.0–5.0)
HCT: 48.1 % (ref 39.0–52.0)
Hemoglobin: 16.7 g/dL (ref 13.0–17.0)
Lymphocytes Relative: 22 % (ref 12.0–46.0)
Lymphs Abs: 1.4 10*3/uL (ref 0.7–4.0)
MCHC: 34.7 g/dL (ref 30.0–36.0)
MCV: 86.2 fl (ref 78.0–100.0)
Monocytes Absolute: 0.5 10*3/uL (ref 0.1–1.0)
Monocytes Relative: 7.4 % (ref 3.0–12.0)
Neutro Abs: 4.4 10*3/uL (ref 1.4–7.7)
Neutrophils Relative %: 66.9 % (ref 43.0–77.0)
Platelets: 152 10*3/uL (ref 150.0–400.0)
RBC: 5.58 Mil/uL (ref 4.22–5.81)
RDW: 14.3 % (ref 11.5–15.5)
WBC: 6.6 10*3/uL (ref 4.0–10.5)

## 2016-11-13 LAB — BASIC METABOLIC PANEL
BUN: 18 mg/dL (ref 6–23)
CO2: 29 mEq/L (ref 19–32)
Calcium: 9.2 mg/dL (ref 8.4–10.5)
Chloride: 110 mEq/L (ref 96–112)
Creatinine, Ser: 0.78 mg/dL (ref 0.40–1.50)
GFR: 107.09 mL/min (ref >60.00)
Glucose, Bld: 142 mg/dL — ABNORMAL HIGH (ref 70–99)
Potassium: 3.3 mEq/L — ABNORMAL LOW (ref 3.5–5.1)
Sodium: 143 mEq/L (ref 135–145)

## 2016-11-13 LAB — HEPATIC FUNCTION PANEL
ALT: 36 U/L (ref 0–53)
AST: 16 U/L (ref 0–37)
Albumin: 4.1 g/dL (ref 3.5–5.2)
Alkaline Phosphatase: 91 U/L (ref 39–117)
Bilirubin, Direct: 0.1 mg/dL (ref 0.0–0.3)
Total Bilirubin: 0.6 mg/dL (ref 0.2–1.2)
Total Protein: 6.4 g/dL (ref 6.0–8.3)

## 2016-11-13 LAB — HEMOGLOBIN A1C: Hgb A1c MFr Bld: 6.6 % — ABNORMAL HIGH (ref 4.6–6.5)

## 2016-11-13 MED ORDER — GLUCOSE BLOOD VI STRP: ORAL_STRIP | 3 refills | Status: DC

## 2016-11-13 MED ORDER — HYDROCODONE-ACETAMINOPHEN 7.5-325 MG PO TABS: 1.0000 | ORAL_TABLET | ORAL | 0 refills | Status: DC | PRN

## 2016-11-13 NOTE — Progress Notes (Signed)
Subjective:  Patient ID: [name] [name], male    DOB: 06/07/1956  Age: 61 y.o. MRN: [id number]  CC: No chief complaint on file.   HPI [name] presents for LBP with L leg pain - no change per pt. LBP is unchanged - 3/10. Worse in AM He has not started PT yet. C/o stress w/wife - cancer  Outpatient Medications Prior to Visit  Medication Sig Dispense Refill  . amLODipine (NORVASC) 5 MG tablet TAKE 1 TABLET (5 MG TOTAL) BY MOUTH DAILY. 30 tablet 5  . cholecalciferol (VITAMIN D) 1000 UNITS tablet Take 1,000 Units by mouth daily.    Marland Kitchen HYDROcodone-acetaminophen (NORCO) 7.5-325 MG tablet Take 1 tablet by mouth every 4 (four) hours as needed for moderate pain. 60 tablet 0  . losartan-hydrochlorothiazide (HYZAAR) 100-25 MG tablet TAKE 1 TABLET BY MOUTH DAILY. 90 tablet 3  . ONETOUCH VERIO test strip TEST UP TO TWO TIMES DAILY DX:250.0 100 each 3  . repaglinide (PRANDIN) 1 MG tablet Take 1 tablet (1 mg total) by mouth 2 (two) times daily before a meal. 60 tablet 11  . pantoprazole (PROTONIX) 40 MG tablet Take 1 tablet (40 mg total) by mouth daily. (Patient not taking: Reported on 10/17/2016) 30 tablet 11   No facility-administered medications prior to visit.     ROS Review of Systems  Constitutional: Negative for appetite change, fatigue and unexpected weight change.  HENT: Negative for congestion, nosebleeds, sneezing, sore throat and trouble swallowing.   Eyes: Negative for itching and visual disturbance.  Respiratory: Negative for cough.   Cardiovascular: Negative for chest pain, palpitations and leg swelling.  Gastrointestinal: Negative for abdominal distention, blood in stool, diarrhea and nausea.  Genitourinary: Negative for frequency and hematuria.  Musculoskeletal: Positive for back pain. Negative for gait problem, joint swelling and neck pain.  Skin: Negative for rash.  Neurological: Negative for dizziness, tremors, speech difficulty and weakness.    Psychiatric/Behavioral: Negative for agitation, dysphoric mood, sleep disturbance and suicidal ideas. The patient is nervous/anxious.     Objective:  BP 130/78   Pulse (!) 58   Temp 98.5 F (36.9 C) (Oral)   Resp 14   Ht 5\' 6"  (1.676 m)   Wt 191 lb 6.4 oz (86.8 kg)   SpO2 98%   BMI 30.89 kg/m   BP Readings from Last 3 Encounters:  11/13/16 130/78  10/17/16 130/78  09/20/16 130/60    Wt Readings from Last 3 Encounters:  11/13/16 191 lb 6.4 oz (86.8 kg)  10/17/16 194 lb 8 oz (88.2 kg)  09/20/16 195 lb (88.5 kg)    Physical Exam  Constitutional: He is oriented to person, place, and time. He appears well-developed. No distress.  NAD  HENT:  Mouth/Throat: Oropharynx is clear and moist.  Eyes: Conjunctivae are normal. Pupils are equal, round, and reactive to light.  Neck: Normal range of motion. No JVD present. No thyromegaly present.  Cardiovascular: Normal rate, regular rhythm, normal heart sounds and intact distal pulses.  Exam reveals no gallop and no friction rub.   No murmur heard. Pulmonary/Chest: Effort normal and breath sounds normal. No respiratory distress. He has no wheezes. He has no rales. He exhibits no tenderness.  Abdominal: Soft. Bowel sounds are normal. He exhibits no distension and no mass. There is no tenderness. There is no rebound and no guarding.  Musculoskeletal: Normal range of motion. He exhibits tenderness. He exhibits no edema.  Lymphadenopathy:    He has no cervical adenopathy.  Neurological:  He is alert and oriented to person, place, and time. He has normal reflexes. No cranial nerve deficit. He exhibits normal muscle tone. He displays a negative Romberg sign. Coordination and gait normal.  Skin: Skin is warm and dry. No rash noted.  Psychiatric: He has a normal mood and affect. His behavior is normal. Judgment and thought content normal.  LS is tender Str leg elev (+/-) B  Lab Results  Component Value Date   WBC 8.0 06/21/2016   HGB 17.4  (H) 06/21/2016   HCT 50.7 06/21/2016   PLT 164.0 06/21/2016   GLUCOSE 199 (H) 06/21/2016   CHOL 144 06/21/2016   TRIG 133.0 06/21/2016   HDL 29.60 (L) 06/21/2016   LDLCALC 88 06/21/2016   ALT 28 06/21/2016   AST 14 06/21/2016   NA 141 06/21/2016   K 3.7 06/21/2016   CL 103 06/21/2016   CREATININE 0.84 06/21/2016   BUN 20 06/21/2016   CO2 31 06/21/2016   TSH 0.94 06/21/2016   PSA 2.92 06/21/2016   INR 1.1 (H) 01/08/2012   HGBA1C 7.7 (H) 06/21/2016    [name]  Result Date: 10/29/2016 CLINICAL DATA:  Chronic bilateral low back pain with left-sided sciatica. History of renal cancer EXAM: MRI LUMBAR SPINE WITHOUT CONTRAST TECHNIQUE: Multiplanar, multisequence MR imaging of the lumbar spine was performed. No intravenous contrast was administered. COMPARISON:  Lumbar spine radiographs 09/24/2016 FINDINGS: Segmentation:  Normal Alignment:  Normal Vertebrae: Negative for fracture or mass. Negative for metastatic disease. Anterior spurring and adjacent bone marrow edema on the left at L3-4 appears degenerative. Conus medullaris: Extends to the L1-2 level and appears normal. Paraspinal and other soft tissues: Paraspinous muscles normal. No retroperitoneal mass or adenopathy. Disc levels: L1-2:  Negative L2-3:  Mild disc bulging without significant stenosis. L3-4: Small disc protrusion in the left foramen and lateral to the foramen with displacement of the left L3 nerve root lateral to the foramen. Mild left foraminal narrowing. Bilateral facet hypertrophy with mild spinal stenosis. Subarticular stenosis on the left. Anterior osteophyte on the left with adjacent bone marrow edema. L4-5: Mild disc bulging and mild facet degeneration. No significant stenosis L5-S1:  Negative IMPRESSION: Small disc protrusion in the left foramen extraforaminal space at L3-4. Mild subarticular and left foraminal narrowing. Mild spinal stenosis. Mild disc and facet degeneration L4-5 without stenosis.  Electronically Signed   By: [name] M.D.   On: 10/29/2016 15:29    Assessment & Plan:   There are no diagnoses linked to this encounter. I am having [name] maintain his ONETOUCH VERIO, pantoprazole, cholecalciferol, repaglinide, amLODipine, losartan-hydrochlorothiazide, and HYDROcodone-acetaminophen.  No orders of the defined types were placed in this encounter.    Follow-up: No Follow-up on file.  Walker Kehr, MD

## 2016-11-13 NOTE — Assessment & Plan Note (Signed)
LBP discussed Norco prn  Potential benefits of a long term opioids use as well as potential risks (i.e. addiction risk, apnea etc) and complications (i.e. Somnolence, constipation and others) were explained to the patient and were aknowledged.

## 2016-11-13 NOTE — Assessment & Plan Note (Signed)
Chronic  L>>R worse since 12/17  LS MRI IMPRESSION: Small disc protrusion in the left foramen extraforaminal space at L3-4. Mild subarticular and left foraminal narrowing. Mild spinal stenosis.  Mild disc and facet degeneration L4-5 without stenosis.   Electronically Signed   By: [name] M.D.   On: 10/29/2016 15:29  Norco prn Ortho ref

## 2016-11-13 NOTE — Progress Notes (Signed)
Pre visit review using our clinic review tool, if applicable. No additional management support is needed unless otherwise documented below in the visit note. 

## 2016-11-13 NOTE — Assessment & Plan Note (Signed)
Monitor CBC 

## 2016-11-23 ENCOUNTER — Other Ambulatory Visit: Payer: Self-pay | Admitting: Internal Medicine

## 2016-11-23 MED ORDER — REPAGLINIDE 1 MG PO TABS: 1.0000 mg | ORAL_TABLET | Freq: Three times a day (TID) | ORAL | 11 refills | Status: DC

## 2016-11-27 DIAGNOSIS — M545 Low back pain: Secondary | ICD-10-CM | POA: Diagnosis not present

## 2016-12-06 ENCOUNTER — Other Ambulatory Visit: Payer: Self-pay

## 2016-12-06 DIAGNOSIS — E118 Type 2 diabetes mellitus with unspecified complications: Secondary | ICD-10-CM

## 2016-12-06 MED ORDER — GLUCOSE BLOOD VI STRP: ORAL_STRIP | 12 refills | Status: DC

## 2016-12-06 NOTE — Telephone Encounter (Signed)
Glucose test strip ordered sent to [facility]

## 2016-12-07 ENCOUNTER — Telehealth: Payer: Self-pay

## 2016-12-07 NOTE — Telephone Encounter (Signed)
Pt lmom rq rf for hydrocodone.   Please advise.

## 2016-12-08 NOTE — Telephone Encounter (Signed)
OK to fill this/these prescription(s) with additional refills x0 Thank you!  

## 2016-12-10 MED ORDER — HYDROCODONE-ACETAMINOPHEN 7.5-325 MG PO TABS: 1.0000 | ORAL_TABLET | ORAL | 0 refills | Status: DC | PRN

## 2016-12-10 NOTE — Telephone Encounter (Signed)
Pt informed rx is ready for pick up

## 2016-12-10 NOTE — Telephone Encounter (Signed)
rx printed

## 2016-12-11 ENCOUNTER — Ambulatory Visit (INDEPENDENT_AMBULATORY_CARE_PROVIDER_SITE_OTHER): Payer: BLUE CROSS/BLUE SHIELD | Admitting: Internal Medicine

## 2016-12-11 ENCOUNTER — Encounter: Payer: Self-pay | Admitting: Internal Medicine

## 2016-12-11 DIAGNOSIS — I10 Essential (primary) hypertension: Secondary | ICD-10-CM | POA: Diagnosis not present

## 2016-12-11 DIAGNOSIS — E118 Type 2 diabetes mellitus with unspecified complications: Secondary | ICD-10-CM | POA: Diagnosis not present

## 2016-12-11 DIAGNOSIS — G8929 Other chronic pain: Secondary | ICD-10-CM | POA: Diagnosis not present

## 2016-12-11 DIAGNOSIS — M5442 Lumbago with sciatica, left side: Secondary | ICD-10-CM

## 2016-12-11 DIAGNOSIS — E876 Hypokalemia: Secondary | ICD-10-CM | POA: Insufficient documentation

## 2016-12-11 MED ORDER — GABAPENTIN 100 MG PO CAPS: 100.0000 mg | ORAL_CAPSULE | Freq: Three times a day (TID) | ORAL | 3 refills | Status: DC | PRN

## 2016-12-11 NOTE — Progress Notes (Signed)
Pre-visit discussion using our clinic review tool. No additional management support is needed unless otherwise documented below in the visit note.  

## 2016-12-11 NOTE — Assessment & Plan Note (Signed)
On Prandin 

## 2016-12-11 NOTE — Patient Instructions (Signed)

## 2016-12-11 NOTE — Assessment & Plan Note (Signed)
Benicar HCT, Norvasc 

## 2016-12-11 NOTE — Assessment & Plan Note (Signed)
KCl po

## 2016-12-11 NOTE — Assessment & Plan Note (Addendum)
He is a little  better. He saw Dr Rolena Infante - PT was recommended. He has not started PT yet - he will start soon. Pain is worse w/activity. Unable to work yet... Will let Kazimierz go back to work on 12/20/16 Added Gabapentin prn Norco prn

## 2016-12-11 NOTE — Progress Notes (Signed)
Subjective:  Patient ID: [name] [name], male    DOB: 1956/03/07  Age: 61 y.o. MRN: [id number]  CC: Follow-up (back pain)   HPI ZAKARIAH WALTMAN presents for LBP - not better. He saw Dr Rolena Infante - PT was recommended. He has not started PT yet. Pain is worse w/activity. Unable to work yet... C/o stress at home  Outpatient Medications Prior to Visit  Medication Sig Dispense Refill  . amLODipine (NORVASC) 5 MG tablet TAKE 1 TABLET (5 MG TOTAL) BY MOUTH DAILY. 30 tablet 5  . cholecalciferol (VITAMIN D) 1000 UNITS tablet Take 1,000 Units by mouth daily.    Marland Kitchen glucose blood test strip Use as instructed 100 each 12  . HYDROcodone-acetaminophen (NORCO) 7.5-325 MG tablet Take 1 tablet by mouth every 4 (four) hours as needed for moderate pain. 60 tablet 0  . losartan-hydrochlorothiazide (HYZAAR) 100-25 MG tablet TAKE 1 TABLET BY MOUTH DAILY. 90 tablet 3  . repaglinide (PRANDIN) 1 MG tablet Take 1 tablet (1 mg total) by mouth 3 (three) times daily before meals. 90 tablet 11  . pantoprazole (PROTONIX) 40 MG tablet Take 1 tablet (40 mg total) by mouth daily. (Patient not taking: Reported on 10/17/2016) 30 tablet 11   No facility-administered medications prior to visit.     ROS Review of Systems  Constitutional: Negative for appetite change, fatigue and unexpected weight change.  HENT: Negative for congestion, nosebleeds, sneezing, sore throat and trouble swallowing.   Eyes: Negative for itching and visual disturbance.  Respiratory: Negative for cough.   Cardiovascular: Negative for chest pain, palpitations and leg swelling.  Gastrointestinal: Negative for abdominal distention, blood in stool, diarrhea and nausea.  Genitourinary: Negative for frequency and hematuria.  Musculoskeletal: Positive for back pain and gait problem. Negative for joint swelling and neck pain.  Skin: Negative for rash.  Neurological: Negative for dizziness, tremors, speech difficulty and weakness.  Psychiatric/Behavioral:  Negative for agitation, dysphoric mood and sleep disturbance. The patient is not nervous/anxious.     Objective:  BP 132/82   Pulse 65   Temp 98.4 F (36.9 C) (Oral)   Resp 16   Ht 5\' 6"  (1.676 m)   Wt 193 lb 8 oz (87.8 kg)   BMI 31.23 kg/m   BP Readings from Last 3 Encounters:  12/11/16 132/82  11/13/16 130/78  10/17/16 130/78    Wt Readings from Last 3 Encounters:  12/11/16 193 lb 8 oz (87.8 kg)  11/13/16 191 lb 6.4 oz (86.8 kg)  10/17/16 194 lb 8 oz (88.2 kg)    Physical Exam  Constitutional: He is oriented to person, place, and time. He appears well-developed. No distress.  NAD  HENT:  Mouth/Throat: Oropharynx is clear and moist.  Eyes: Conjunctivae are normal. Pupils are equal, round, and reactive to light.  Neck: Normal range of motion. No JVD present. No thyromegaly present.  Cardiovascular: Normal rate, regular rhythm, normal heart sounds and intact distal pulses.  Exam reveals no gallop and no friction rub.   No murmur heard. Pulmonary/Chest: Effort normal and breath sounds normal. No respiratory distress. He has no wheezes. He has no rales. He exhibits no tenderness.  Abdominal: Soft. Bowel sounds are normal. He exhibits no distension and no mass. There is no tenderness. There is no rebound and no guarding.  Musculoskeletal: Normal range of motion. He exhibits tenderness. He exhibits no edema.  Lymphadenopathy:    He has no cervical adenopathy.  Neurological: He is alert and oriented to person, place, and time.  He has normal reflexes. No cranial nerve deficit. He exhibits normal muscle tone. He displays a negative Romberg sign. Coordination and gait normal.  Skin: Skin is warm and dry. No rash noted.  Psychiatric: He has a normal mood and affect. His behavior is normal. Judgment and thought content normal.  LS is tender w/ROM Str leg elevation is +/-  Lab Results  Component Value Date   WBC 6.6 11/13/2016   HGB 16.7 11/13/2016   HCT 48.1 11/13/2016   PLT  152.0 11/13/2016   GLUCOSE 142 (H) 11/13/2016   CHOL 144 06/21/2016   TRIG 133.0 06/21/2016   HDL 29.60 (L) 06/21/2016   LDLCALC 88 06/21/2016   ALT 36 11/13/2016   AST 16 11/13/2016   NA 143 11/13/2016   K 3.3 (L) 11/13/2016   CL 110 11/13/2016   CREATININE 0.78 11/13/2016   BUN 18 11/13/2016   CO2 29 11/13/2016   TSH 0.94 06/21/2016   PSA 2.92 06/21/2016   INR 1.1 (H) 01/08/2012   HGBA1C 6.6 (H) 11/13/2016    [name]  Result Date: 10/29/2016 CLINICAL DATA:  Chronic bilateral low back pain with left-sided sciatica. History of renal cancer EXAM: MRI LUMBAR SPINE WITHOUT CONTRAST TECHNIQUE: Multiplanar, multisequence MR imaging of the lumbar spine was performed. No intravenous contrast was administered. COMPARISON:  Lumbar spine radiographs 09/24/2016 FINDINGS: Segmentation:  Normal Alignment:  Normal Vertebrae: Negative for fracture or mass. Negative for metastatic disease. Anterior spurring and adjacent bone marrow edema on the left at L3-4 appears degenerative. Conus medullaris: Extends to the L1-2 level and appears normal. Paraspinal and other soft tissues: Paraspinous muscles normal. No retroperitoneal mass or adenopathy. Disc levels: L1-2:  Negative L2-3:  Mild disc bulging without significant stenosis. L3-4: Small disc protrusion in the left foramen and lateral to the foramen with displacement of the left L3 nerve root lateral to the foramen. Mild left foraminal narrowing. Bilateral facet hypertrophy with mild spinal stenosis. Subarticular stenosis on the left. Anterior osteophyte on the left with adjacent bone marrow edema. L4-5: Mild disc bulging and mild facet degeneration. No significant stenosis L5-S1:  Negative IMPRESSION: Small disc protrusion in the left foramen extraforaminal space at L3-4. Mild subarticular and left foraminal narrowing. Mild spinal stenosis. Mild disc and facet degeneration L4-5 without stenosis. Electronically Signed   By: [name]  M.D.   On: 10/29/2016 15:29    Assessment & Plan:   There are no diagnoses linked to this encounter. I have discontinued Mr. Suarez pantoprazole. I am also having him maintain his cholecalciferol, amLODipine, losartan-hydrochlorothiazide, repaglinide, glucose blood, and HYDROcodone-acetaminophen.  No orders of the defined types were placed in this encounter.    Follow-up: No Follow-up on file.  Walker Kehr, MD

## 2016-12-13 ENCOUNTER — Other Ambulatory Visit: Payer: Self-pay | Admitting: Internal Medicine

## 2016-12-14 DIAGNOSIS — M545 Low back pain: Secondary | ICD-10-CM | POA: Diagnosis not present

## 2016-12-16 ENCOUNTER — Other Ambulatory Visit: Payer: Self-pay | Admitting: Internal Medicine

## 2016-12-18 ENCOUNTER — Telehealth: Payer: Self-pay

## 2016-12-18 DIAGNOSIS — E118 Type 2 diabetes mellitus with unspecified complications: Secondary | ICD-10-CM

## 2016-12-18 NOTE — Telephone Encounter (Signed)
PA for One[employer] received.   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

## 2016-12-19 MED ORDER — GLUCOSE BLOOD VI STRP: ORAL_STRIP | 12 refills | Status: DC

## 2016-12-19 MED ORDER — LANCETS MISC: 3 refills | Status: AC

## 2016-12-19 MED ORDER — BLOOD GLUCOSE MONITOR KIT: PACK | 0 refills | Status: AC

## 2016-12-19 NOTE — Telephone Encounter (Signed)
rx faxed to [facility].  

## 2016-12-19 NOTE — Telephone Encounter (Signed)
Printed for PCP to sign 

## 2016-12-28 DIAGNOSIS — M545 Low back pain: Secondary | ICD-10-CM | POA: Diagnosis not present

## 2017-01-16 DIAGNOSIS — M545 Low back pain: Secondary | ICD-10-CM | POA: Diagnosis not present

## 2017-01-17 ENCOUNTER — Other Ambulatory Visit: Payer: Self-pay | Admitting: Internal Medicine

## 2017-01-17 NOTE — Telephone Encounter (Signed)
Pt requesting pain medication for back pain.  Last seen 12/11/2016... Last refill 12/10/2016....please advise

## 2017-01-18 ENCOUNTER — Encounter: Payer: Self-pay | Admitting: Internal Medicine

## 2017-01-18 ENCOUNTER — Ambulatory Visit (INDEPENDENT_AMBULATORY_CARE_PROVIDER_SITE_OTHER): Payer: BLUE CROSS/BLUE SHIELD | Admitting: Internal Medicine

## 2017-01-18 VITALS — BP 138/76 | HR 73 | Temp 98.7°F | Ht 66.0 in | Wt 187.1 lb

## 2017-01-18 DIAGNOSIS — I10 Essential (primary) hypertension: Secondary | ICD-10-CM | POA: Diagnosis not present

## 2017-01-18 DIAGNOSIS — M5442 Lumbago with sciatica, left side: Secondary | ICD-10-CM

## 2017-01-18 DIAGNOSIS — E118 Type 2 diabetes mellitus with unspecified complications: Secondary | ICD-10-CM

## 2017-01-18 DIAGNOSIS — L0591 Pilonidal cyst without abscess: Secondary | ICD-10-CM

## 2017-01-18 DIAGNOSIS — G8929 Other chronic pain: Secondary | ICD-10-CM

## 2017-01-18 DIAGNOSIS — D485 Neoplasm of uncertain behavior of skin: Secondary | ICD-10-CM | POA: Diagnosis not present

## 2017-01-18 MED ORDER — HYDROCODONE-ACETAMINOPHEN 7.5-325 MG PO TABS: 1.0000 | ORAL_TABLET | ORAL | 0 refills | Status: DC | PRN

## 2017-01-18 MED ORDER — DOXYCYCLINE HYCLATE 100 MG PO TABS: 100.0000 mg | ORAL_TABLET | Freq: Two times a day (BID) | ORAL | 0 refills | Status: DC

## 2017-01-18 NOTE — Progress Notes (Signed)
Subjective:  Patient ID: [name] [name], male    DOB: [birth year]  Age: 61 y.o. MRN: [id number]  CC: No chief complaint on file.   HPI [name] [name] presents for LBP, DM, HTN f/u LBP is better  Outpatient Medications Prior to Visit  Medication Sig Dispense Refill  . amLODipine (NORVASC) 5 MG tablet TAKE 1 TABLET (5 MG TOTAL) BY MOUTH DAILY. 30 tablet 5  . blood glucose meter kit and supplies KIT Use to test blood sugar once a day. DX: E11.9 1 each 0  . cholecalciferol (VITAMIN D) 1000 UNITS tablet Take 1,000 Units by mouth daily.    Marland Kitchen glucose blood test strip Use to check blood sugar once per day. 100 each 12  . HYDROcodone-acetaminophen (NORCO) 7.5-325 MG tablet Take 1 tablet by mouth every 4 (four) hours as needed for moderate pain. 60 tablet 0  . Lancets MISC Use to check blood sugar once per day. 100 each 3  . losartan-hydrochlorothiazide (HYZAAR) 100-25 MG tablet TAKE 1 TABLET BY MOUTH DAILY. 90 tablet 3  . repaglinide (PRANDIN) 1 MG tablet Take 1 tablet (1 mg total) by mouth 3 (three) times daily before meals. 90 tablet 11  . gabapentin (NEURONTIN) 100 MG capsule Take 1-2 capsules (100-200 mg total) by mouth 3 (three) times daily as needed. (Patient not taking: Reported on 01/18/2017) 90 capsule 3   No facility-administered medications prior to visit.     ROS Review of Systems  Constitutional: Negative for appetite change, fatigue and unexpected weight change.  HENT: Negative for congestion, nosebleeds, sneezing, sore throat and trouble swallowing.   Eyes: Negative for itching and visual disturbance.  Respiratory: Negative for cough.   Cardiovascular: Negative for chest pain, palpitations and leg swelling.  Gastrointestinal: Negative for abdominal distention, blood in stool, diarrhea and nausea.  Genitourinary: Negative for frequency and hematuria.  Musculoskeletal: Positive for back pain. Negative for gait problem, joint swelling and neck pain.  Skin: Negative for rash.   Neurological: Negative for dizziness, tremors, speech difficulty and weakness.  Psychiatric/Behavioral: Negative for agitation, dysphoric mood and sleep disturbance. The patient is not nervous/anxious.     Objective:  BP 138/76 (BP Location: Right Arm, Patient Position: Sitting, Cuff Size: Large)   Pulse 73   Temp 98.7 F (37.1 C) (Oral)   Ht _0  (1.676 m)   Wt 187 lb 1.9 oz (84.9 kg)   SpO2 96%   BMI 30.20 kg/m   BP Readings from Last 3 Encounters:  01/18/17 138/76  12/11/16 132/82  11/13/16 130/78    Wt Readings from Last 3 Encounters:  01/18/17 187 lb 1.9 oz (84.9 kg)  12/11/16 193 lb 8 oz (87.8 kg)  11/13/16 191 lb 6.4 oz (86.8 kg)    Physical Exam  Constitutional: He is oriented to person, place, and time. He appears well-developed. No distress.  NAD  HENT:  Mouth/Throat: Oropharynx is clear and moist.  Eyes: Conjunctivae are normal. Pupils are equal, round, and reactive to light.  Neck: Normal range of motion. No JVD present. No thyromegaly present.  Cardiovascular: Normal rate, regular rhythm, normal heart sounds and intact distal pulses.  Exam reveals no gallop and no friction rub.   No murmur heard. Pulmonary/Chest: Effort normal and breath sounds normal. No respiratory distress. He has no wheezes. He has no rales. He exhibits no tenderness.  Abdominal: Soft. Bowel sounds are normal. He exhibits no distension and no mass. There is no tenderness. There is no rebound and no guarding.  Musculoskeletal: Normal range of motion. He exhibits tenderness. He exhibits no edema.  Lymphadenopathy:    He has no cervical adenopathy.  Neurological: He is alert and oriented to person, place, and time. He has normal reflexes. No cranial nerve deficit. He exhibits normal muscle tone. He displays a negative Romberg sign. Coordination and gait normal.  Skin: Skin is warm and dry. No rash noted.  Psychiatric: He has a normal mood and affect. His behavior is normal. Judgment and  thought content normal.  LS tender w/ROM Moles on back  Lab Results  Component Value Date   WBC 6.6 11/13/2016   HGB 16.7 11/13/2016   HCT 48.1 11/13/2016   PLT 152.0 11/13/2016   GLUCOSE 142 (H) 11/13/2016   CHOL 144 06/21/2016   TRIG 133.0 06/21/2016   HDL 29.60 (L) 06/21/2016   LDLCALC 88 06/21/2016   ALT 36 11/13/2016   AST 16 11/13/2016   NA 143 11/13/2016   K 3.3 (L) 11/13/2016   CL 110 11/13/2016   CREATININE 0.78 11/13/2016   BUN 18 11/13/2016   CO2 29 11/13/2016   TSH 0.94 06/21/2016   PSA 2.92 06/21/2016   INR 1.1 (H) 01/08/2012   HGBA1C 6.6 (H) 11/13/2016    [name]  Result Date: 10/29/2016 CLINICAL DATA:  Chronic bilateral low back pain with left-sided sciatica. History of renal cancer EXAM: MRI LUMBAR SPINE WITHOUT CONTRAST TECHNIQUE: Multiplanar, multisequence MR imaging of the lumbar spine was performed. No intravenous contrast was administered. COMPARISON:  Lumbar spine radiographs 09/24/2016 FINDINGS: Segmentation:  Normal Alignment:  Normal Vertebrae: Negative for fracture or mass. Negative for metastatic disease. Anterior spurring and adjacent bone marrow edema on the left at L3-4 appears degenerative. Conus medullaris: Extends to the L1-2 level and appears normal. Paraspinal and other soft tissues: Paraspinous muscles normal. No retroperitoneal mass or adenopathy. Disc levels: L1-2:  Negative L2-3:  Mild disc bulging without significant stenosis. L3-4: Small disc protrusion in the left foramen and lateral to the foramen with displacement of the left L3 nerve root lateral to the foramen. Mild left foraminal narrowing. Bilateral facet hypertrophy with mild spinal stenosis. Subarticular stenosis on the left. Anterior osteophyte on the left with adjacent bone marrow edema. L4-5: Mild disc bulging and mild facet degeneration. No significant stenosis L5-S1:  Negative IMPRESSION: Small disc protrusion in the left foramen extraforaminal space at L3-4.  Mild subarticular and left foraminal narrowing. Mild spinal stenosis. Mild disc and facet degeneration L4-5 without stenosis. Electronically Signed   By: [name] M.D.   On: 10/29/2016 15:29    Assessment & Plan:   There are no diagnoses linked to this encounter. I am having [name] [name] maintain his cholecalciferol, amLODipine, losartan-hydrochlorothiazide, repaglinide, HYDROcodone-acetaminophen, gabapentin, blood glucose meter kit and supplies, glucose blood, Lancets, and metFORMIN.  Meds ordered this encounter  Medications  . metFORMIN (GLUCOPHAGE) 500 MG tablet    Refill:  4     Follow-up: No Follow-up on file.  [name] Kehr, MD

## 2017-01-18 NOTE — Progress Notes (Signed)
Pre visit review using our clinic review tool, if applicable. No additional management support is needed unless otherwise documented below in the visit note. 

## 2017-01-18 NOTE — Patient Instructions (Signed)
Pilonidal Cyst A pilonidal cyst is a fluid-filled sac. It forms beneath the skin near your tailbone, at the top of the crease of your buttocks. A pilonidal cyst that is not large or infected may not cause symptoms or problems. If the cyst becomes irritated or infected, it may fill with pus. This causes pain and swelling (pilonidal abscess). An infected cyst may need to be treated with medicine, drained, or removed. What are the causes? The cause of a pilonidal cyst is not known. One cause may be a hair that grows into your skin (ingrown hair). What increases the risk? Pilonidal cysts are more common in boys and men. Risk factors include:  Having lots of hair near the crease of the buttocks.  Being overweight.  Having a pilonidal dimple.  Wearing tight clothing.  Not bathing or showering frequently.  Sitting for long periods of time. What are the signs or symptoms? Signs and symptoms of a pilonidal cyst may include:  Redness.  Pain and tenderness.  Warmth.  Swelling.  Pus.  Fever. How is this diagnosed? Your health care provider may diagnose a pilonidal cyst based on your symptoms and a physical exam. The health care provider may do a blood test to check for infection. If your cyst is draining pus, your health care provider may take a sample of the drainage to be tested at a laboratory. How is this treated? Surgery is the usual treatment for an infected pilonidal cyst. You may also have to take medicines before surgery. The type of surgery you have depends on the size and severity of the infected cyst. The different kinds of surgery include:  Incision and drainage. This is a procedure to open and drain the cyst.  Marsupialization. In this procedure, a large cyst or abscess may be opened and kept open by stitching the edges of the skin to the cyst walls.  Cyst removal. This procedure involves opening the skin and removing all or part of the cyst. Follow these instructions at  home:  Follow all of your surgeon's instructions carefully if you had surgery.  Take medicines only as directed by your health care provider.  If you were prescribed an antibiotic medicine, finish it all even if you start to feel better.  Keep the area around your pilonidal cyst clean and dry.  Clean the area as directed by your health care provider. Pat the area dry with a clean towel. Do not rub it as this may cause bleeding.  Remove hair from the area around the cyst as directed by your health care provider.  Do not wear tight clothing or sit in one place for long periods of time.  There are many different ways to close and cover an incision, including stitches, skin glue, and adhesive strips. Follow your health care provider's instructions on:  Incision care.  Bandage (dressing) changes and removal.  Incision closure removal. Contact a health care provider if:  You have drainage, redness, swelling, or pain at the site of the cyst.  You have a fever. This information is not intended to replace advice given to you by your health care provider. Make sure you discuss any questions you have with your health care provider. Document Released: 09/28/2000 Document Revised: 03/08/2016 Document Reviewed: 02/18/2014 Elsevier Interactive Patient Education  2017 [employer].

## 2017-01-18 NOTE — Assessment & Plan Note (Signed)
Benicar HCT, Norvasc 

## 2017-01-18 NOTE — Assessment & Plan Note (Signed)
On Prandin Labs

## 2017-01-18 NOTE — Assessment & Plan Note (Signed)
Doxy Dr Johney Maine ref

## 2017-01-18 NOTE — Assessment & Plan Note (Addendum)
Not taking Gabapentin Feeling better after PT Norco - using less  Potential benefits of a long term opioids use as well as potential risks (i.e. addiction risk, apnea etc) and complications (i.e. Somnolence, constipation and others) were explained to the patient and were aknowledged.

## 2017-01-18 NOTE — Assessment & Plan Note (Signed)
Skin bx w/me 

## 2017-01-22 ENCOUNTER — Encounter: Payer: Self-pay | Admitting: Internal Medicine

## 2017-01-22 ENCOUNTER — Ambulatory Visit (INDEPENDENT_AMBULATORY_CARE_PROVIDER_SITE_OTHER): Payer: BLUE CROSS/BLUE SHIELD | Admitting: Internal Medicine

## 2017-01-22 ENCOUNTER — Other Ambulatory Visit: Payer: Self-pay | Admitting: Internal Medicine

## 2017-01-22 VITALS — BP 134/86 | HR 66 | Temp 98.8°F | Ht 66.0 in | Wt 189.1 lb

## 2017-01-22 DIAGNOSIS — L820 Inflamed seborrheic keratosis: Secondary | ICD-10-CM

## 2017-01-22 DIAGNOSIS — D485 Neoplasm of uncertain behavior of skin: Secondary | ICD-10-CM

## 2017-01-22 DIAGNOSIS — C4490 Unspecified malignant neoplasm of skin, unspecified: Secondary | ICD-10-CM

## 2017-01-22 DIAGNOSIS — D225 Melanocytic nevi of trunk: Secondary | ICD-10-CM | POA: Diagnosis not present

## 2017-01-22 NOTE — Progress Notes (Signed)
Pre visit review using our clinic review tool, if applicable. No additional management support is needed unless otherwise documented below in the visit note. 

## 2017-01-24 DIAGNOSIS — M545 Low back pain: Secondary | ICD-10-CM | POA: Diagnosis not present

## 2017-01-27 ENCOUNTER — Encounter: Payer: Self-pay | Admitting: Internal Medicine

## 2017-01-27 NOTE — Assessment & Plan Note (Signed)
See procedure 

## 2017-01-27 NOTE — Progress Notes (Signed)
Subjective:  Patient ID: [name] [name], male    DOB: [birth year]  Age: 61 y.o. MRN: [id number]  CC: No chief complaint on file.   HPI [name] [name] presents for skin bx  Outpatient Medications Prior to Visit  Medication Sig Dispense Refill  . amLODipine (NORVASC) 5 MG tablet TAKE 1 TABLET (5 MG TOTAL) BY MOUTH DAILY. 30 tablet 5  . blood glucose meter kit and supplies KIT Use to test blood sugar once a day. DX: E11.9 1 each 0  . cholecalciferol (VITAMIN D) 1000 UNITS tablet Take 1,000 Units by mouth daily.    Marland Kitchen doxycycline (VIBRA-TABS) 100 MG tablet Take 1 tablet (100 mg total) by mouth 2 (two) times daily. 20 tablet 0  . gabapentin (NEURONTIN) 100 MG capsule Take 1-2 capsules (100-200 mg total) by mouth 3 (three) times daily as needed. 90 capsule 3  . glucose blood test strip Use to check blood sugar once per day. 100 each 12  . HYDROcodone-acetaminophen (NORCO) 7.5-325 MG tablet Take 1 tablet by mouth every 4 (four) hours as needed for moderate pain. 40 tablet 0  . Lancets MISC Use to check blood sugar once per day. 100 each 3  . losartan-hydrochlorothiazide (HYZAAR) 100-25 MG tablet TAKE 1 TABLET BY MOUTH DAILY. 90 tablet 3  . metFORMIN (GLUCOPHAGE) 500 MG tablet   4  . repaglinide (PRANDIN) 1 MG tablet Take 1 tablet (1 mg total) by mouth 3 (three) times daily before meals. 90 tablet 11   No facility-administered medications prior to visit.     ROS Review of Systems  Objective:  BP 134/86 (BP Location: Left Arm, Patient Position: Sitting, Cuff Size: Large)   Pulse 66   Temp 98.8 F (37.1 C) (Oral)   Ht _0  (1.676 m)   Wt 189 lb 1.9 oz (85.8 kg)   SpO2 99%   BMI 30.52 kg/m   BP Readings from Last 3 Encounters:  01/22/17 134/86  01/18/17 138/76  12/11/16 132/82    Wt Readings from Last 3 Encounters:  01/22/17 189 lb 1.9 oz (85.8 kg)  01/18/17 187 lb 1.9 oz (84.9 kg)  12/11/16 193 lb 8 oz (87.8 kg)    Physical Exam     Procedure Note :     Procedure  :  Skin biopsy   Indication:  Changing mole (s ),  Suspicious lesion(s)   Risks including unsuccessful procedure , bleeding, infection, bruising, scar, a need for another complete procedure and others were explained to the patient in detail as well as the benefits. Informed consent was obtained.   The patient was placed in a decubitus position.  Lesion #1 on  Upper back    measuring  11x9   mm   Skin over lesion #1  was prepped with Betadine and alcohol  and anesthetized with 1 cc of 2% lidocaine and epinephrine, using a 25-gauge 1 inch needle.  Shave biopsy with a sterile Dermablade was carried out in the usual fashion. Hyfrecator was used to destroy the rest of the lesion potentially left behind and for hemostasis. Band-Aid was applied with antibiotic ointment.    Lesion #2 on  R lumbar back   measuring 6x5  mm   Skin over lesion #2  was prepped with Betadine and alcohol  and anesthetized with 1 cc of 2% lidocaine and epinephrine, using a 25-gauge 1 inch needle.  Shave biopsy with a sterile Dermablade was carried out in the usual fashion. Hyfrecator was used to destroy  the rest of the lesion potentially left behind and for hemostasis. Band-Aid was applied with antibiotic ointment.  Lesion #3 on R lumbar back below #2   Measuring 3x3   mm   Skin over lesion #3  was prepped with Betadine and alcohol  and anesthetized with 1 cc of 2% lidocaine and epinephrine, using a 25-gauge 1 inch needle.  Shave biopsy with a sterile Dermablade was carried out in the usual fashion. Hyfrecator was used to destroy the rest of the lesion potentially left behind and for hemostasis. Band-Aid was applied with antibiotic ointment.   Tolerated well. Complications none.          Lab Results  Component Value Date   WBC 6.6 11/13/2016   HGB 16.7 11/13/2016   HCT 48.1 11/13/2016   PLT 152.0 11/13/2016   GLUCOSE 142 (H) 11/13/2016   CHOL 144 06/21/2016   TRIG 133.0 06/21/2016   HDL 29.60 (L) 06/21/2016     LDLCALC 88 06/21/2016   ALT 36 11/13/2016   AST 16 11/13/2016   NA 143 11/13/2016   K 3.3 (L) 11/13/2016   CL 110 11/13/2016   CREATININE 0.78 11/13/2016   BUN 18 11/13/2016   CO2 29 11/13/2016   TSH 0.94 06/21/2016   PSA 2.92 06/21/2016   INR 1.1 (H) 01/08/2012   HGBA1C 6.6 (H) 11/13/2016    [name]  Result Date: 10/29/2016 CLINICAL DATA:  Chronic bilateral low back pain with left-sided sciatica. History of renal cancer EXAM: MRI LUMBAR SPINE WITHOUT CONTRAST TECHNIQUE: Multiplanar, multisequence MR imaging of the lumbar spine was performed. No intravenous contrast was administered. COMPARISON:  Lumbar spine radiographs 09/24/2016 FINDINGS: Segmentation:  Normal Alignment:  Normal Vertebrae: Negative for fracture or mass. Negative for metastatic disease. Anterior spurring and adjacent bone marrow edema on the left at L3-4 appears degenerative. Conus medullaris: Extends to the L1-2 level and appears normal. Paraspinal and other soft tissues: Paraspinous muscles normal. No retroperitoneal mass or adenopathy. Disc levels: L1-2:  Negative L2-3:  Mild disc bulging without significant stenosis. L3-4: Small disc protrusion in the left foramen and lateral to the foramen with displacement of the left L3 nerve root lateral to the foramen. Mild left foraminal narrowing. Bilateral facet hypertrophy with mild spinal stenosis. Subarticular stenosis on the left. Anterior osteophyte on the left with adjacent bone marrow edema. L4-5: Mild disc bulging and mild facet degeneration. No significant stenosis L5-S1:  Negative IMPRESSION: Small disc protrusion in the left foramen extraforaminal space at L3-4. Mild subarticular and left foraminal narrowing. Mild spinal stenosis. Mild disc and facet degeneration L4-5 without stenosis. Electronically Signed   By: [name] M.D.   On: 10/29/2016 15:29    Assessment & Plan:   Diagnoses and all orders for this visit:  Unspecified malignant  neoplasm of skin, unspecified -     Dermatology pathology; Future   I am having [name] [name] maintain his cholecalciferol, amLODipine, losartan-hydrochlorothiazide, repaglinide, gabapentin, blood glucose meter kit and supplies, glucose blood, Lancets, metFORMIN, doxycycline, and HYDROcodone-acetaminophen.  No orders of the defined types were placed in this encounter.    Follow-up: No Follow-up on file.  Walker Kehr, MD

## 2017-02-16 ENCOUNTER — Encounter: Payer: Self-pay | Admitting: Internal Medicine

## 2017-04-04 ENCOUNTER — Other Ambulatory Visit: Payer: Self-pay | Admitting: Internal Medicine

## 2017-04-22 ENCOUNTER — Other Ambulatory Visit: Payer: BLUE CROSS/BLUE SHIELD

## 2017-04-22 ENCOUNTER — Telehealth: Payer: Self-pay | Admitting: Internal Medicine

## 2017-04-22 DIAGNOSIS — M5442 Lumbago with sciatica, left side: Secondary | ICD-10-CM

## 2017-04-22 DIAGNOSIS — G8929 Other chronic pain: Secondary | ICD-10-CM

## 2017-04-22 DIAGNOSIS — E118 Type 2 diabetes mellitus with unspecified complications: Secondary | ICD-10-CM

## 2017-04-22 NOTE — Telephone Encounter (Signed)
Labs entered.

## 2017-04-22 NOTE — Telephone Encounter (Signed)
Patient has appointment for tomorrow with Dr. Camila Li.  States went to lab this morning and there was no labs entered.  Patient would like to come back to have labs done as soon as possible.

## 2017-04-23 ENCOUNTER — Ambulatory Visit (INDEPENDENT_AMBULATORY_CARE_PROVIDER_SITE_OTHER): Payer: BLUE CROSS/BLUE SHIELD | Admitting: Internal Medicine

## 2017-04-23 ENCOUNTER — Encounter: Payer: Self-pay | Admitting: Internal Medicine

## 2017-04-23 VITALS — BP 118/74 | HR 68 | Temp 98.3°F | Ht 66.0 in | Wt 193.0 lb

## 2017-04-23 DIAGNOSIS — D751 Secondary polycythemia: Secondary | ICD-10-CM | POA: Diagnosis not present

## 2017-04-23 DIAGNOSIS — D485 Neoplasm of uncertain behavior of skin: Secondary | ICD-10-CM | POA: Diagnosis not present

## 2017-04-23 DIAGNOSIS — E118 Type 2 diabetes mellitus with unspecified complications: Secondary | ICD-10-CM

## 2017-04-23 DIAGNOSIS — I10 Essential (primary) hypertension: Secondary | ICD-10-CM

## 2017-04-23 DIAGNOSIS — M159 Polyosteoarthritis, unspecified: Secondary | ICD-10-CM

## 2017-04-23 DIAGNOSIS — M8949 Other hypertrophic osteoarthropathy, multiple sites: Secondary | ICD-10-CM

## 2017-04-23 DIAGNOSIS — G8929 Other chronic pain: Secondary | ICD-10-CM

## 2017-04-23 DIAGNOSIS — L910 Hypertrophic scar: Secondary | ICD-10-CM

## 2017-04-23 DIAGNOSIS — M150 Primary generalized (osteo)arthritis: Secondary | ICD-10-CM

## 2017-04-23 DIAGNOSIS — M5442 Lumbago with sciatica, left side: Secondary | ICD-10-CM

## 2017-04-23 MED ORDER — TRIAMCINOLONE ACETONIDE 0.1 % EX OINT: 1.0000 "application " | TOPICAL_OINTMENT | Freq: Two times a day (BID) | CUTANEOUS | 2 refills | Status: DC

## 2017-04-23 MED ORDER — METHYLPREDNISOLONE ACETATE 40 MG/ML IJ SUSP
40.0000 mg | Freq: Once | INTRAMUSCULAR | Status: AC
  Administered 2017-04-23: 20 mg via INTRAMUSCULAR

## 2017-04-23 NOTE — Assessment & Plan Note (Addendum)
Labs °Prandin °

## 2017-04-23 NOTE — Assessment & Plan Note (Signed)
CBC

## 2017-04-23 NOTE — Progress Notes (Signed)
Subjective:  Patient ID: [name] [name], male    DOB: 28-Apr-1956  Age: 61 y.o. MRN: [id number]  CC: No chief complaint on file.   HPI ELIAH MARQUARD presents for LBP, HTN, moles f/u C/o itching scars on the leg  Outpatient Medications Prior to Visit  Medication Sig Dispense Refill  . amLODipine (NORVASC) 5 MG tablet TAKE 1 TABLET (5 MG TOTAL) BY MOUTH DAILY. 90 tablet 1  . blood glucose meter kit and supplies KIT Use to test blood sugar once a day. DX: E11.9 1 each 0  . cholecalciferol (VITAMIN D) 1000 UNITS tablet Take 1,000 Units by mouth daily.    Marland Kitchen glucose blood test strip Use to check blood sugar once per day. 100 each 12  . HYDROcodone-acetaminophen (NORCO) 7.5-325 MG tablet Take 1 tablet by mouth every 4 (four) hours as needed for moderate pain. 40 tablet 0  . Lancets MISC Use to check blood sugar once per day. 100 each 3  . losartan-hydrochlorothiazide (HYZAAR) 100-25 MG tablet TAKE 1 TABLET BY MOUTH DAILY. 90 tablet 3  . repaglinide (PRANDIN) 1 MG tablet Take 1 tablet (1 mg total) by mouth 3 (three) times daily before meals. 90 tablet 11  . doxycycline (VIBRA-TABS) 100 MG tablet Take 1 tablet (100 mg total) by mouth 2 (two) times daily. (Patient not taking: Reported on 04/23/2017) 20 tablet 0  . gabapentin (NEURONTIN) 100 MG capsule Take 1-2 capsules (100-200 mg total) by mouth 3 (three) times daily as needed. (Patient not taking: Reported on 04/23/2017) 90 capsule 3  . metFORMIN (GLUCOPHAGE) 500 MG tablet   4   No facility-administered medications prior to visit.     ROS Review of Systems  Constitutional: Negative for appetite change, fatigue and unexpected weight change.  HENT: Negative for congestion, nosebleeds, sneezing, sore throat and trouble swallowing.   Eyes: Negative for itching and visual disturbance.  Respiratory: Negative for cough.   Cardiovascular: Negative for chest pain, palpitations and leg swelling.  Gastrointestinal: Negative for abdominal  distention, blood in stool, diarrhea and nausea.  Genitourinary: Negative for frequency and hematuria.  Musculoskeletal: Negative for back pain, gait problem, joint swelling and neck pain.  Skin: Negative for rash.  Neurological: Negative for dizziness, tremors, speech difficulty and weakness.  Psychiatric/Behavioral: Negative for agitation, dysphoric mood, sleep disturbance and suicidal ideas. The patient is not nervous/anxious.     Objective:  BP 118/74 (BP Location: Left Arm, Patient Position: Sitting, Cuff Size: Normal)   Pulse 68   Temp 98.3 F (36.8 C) (Oral)   Ht '5\' 6"'$  (1.676 m)   Wt 193 lb (87.5 kg)   SpO2 100%   BMI 31.15 kg/m   BP Readings from Last 3 Encounters:  04/23/17 118/74  01/22/17 134/86  01/18/17 138/76    Wt Readings from Last 3 Encounters:  04/23/17 193 lb (87.5 kg)  01/22/17 189 lb 1.9 oz (85.8 kg)  01/18/17 187 lb 1.9 oz (84.9 kg)    Physical Exam  Constitutional: He is oriented to person, place, and time. He appears well-developed. No distress.  NAD  HENT:  Mouth/Throat: Oropharynx is clear and moist.  Eyes: Conjunctivae are normal. Pupils are equal, round, and reactive to light.  Neck: Normal range of motion. No JVD present. No thyromegaly present.  Cardiovascular: Normal rate, regular rhythm, normal heart sounds and intact distal pulses.  Exam reveals no gallop and no friction rub.   No murmur heard. Pulmonary/Chest: Effort normal and breath sounds normal. No respiratory distress.  He has no wheezes. He has no rales. He exhibits no tenderness.  Abdominal: Soft. Bowel sounds are normal. He exhibits no distension and no mass. There is no tenderness. There is no rebound and no guarding.  Musculoskeletal: Normal range of motion. He exhibits no edema or tenderness.  Lymphadenopathy:    He has no cervical adenopathy.  Neurological: He is alert and oriented to person, place, and time. He has normal reflexes. No cranial nerve deficit. He exhibits normal  muscle tone. He displays a negative Romberg sign. Coordination and gait normal.  Skin: Skin is warm and dry. No rash noted.  Psychiatric: He has a normal mood and affect. His behavior is normal. Judgment and thought content normal.   L anterior ankle scaly papule 11x10 mm lesion Moles abd and back 2-3 mm R post shin nodules/scars 4 mm   Procedure: Indication: keloid scars 2 keloid lesions injected w/20 mg Depo-medrol each Tolerated well Complications - none  Lab Results  Component Value Date   WBC 6.6 11/13/2016   HGB 16.7 11/13/2016   HCT 48.1 11/13/2016   PLT 152.0 11/13/2016   GLUCOSE 142 (H) 11/13/2016   CHOL 144 06/21/2016   TRIG 133.0 06/21/2016   HDL 29.60 (L) 06/21/2016   LDLCALC 88 06/21/2016   ALT 36 11/13/2016   AST 16 11/13/2016   NA 143 11/13/2016   K 3.3 (L) 11/13/2016   CL 110 11/13/2016   CREATININE 0.78 11/13/2016   BUN 18 11/13/2016   CO2 29 11/13/2016   TSH 0.94 06/21/2016   PSA 2.92 06/21/2016   INR 1.1 (H) 01/08/2012   HGBA1C 6.6 (H) 11/13/2016    [name]  Result Date: 10/29/2016 CLINICAL DATA:  Chronic bilateral low back pain with left-sided sciatica. History of renal cancer EXAM: MRI LUMBAR SPINE WITHOUT CONTRAST TECHNIQUE: Multiplanar, multisequence MR imaging of the lumbar spine was performed. No intravenous contrast was administered. COMPARISON:  Lumbar spine radiographs 09/24/2016 FINDINGS: Segmentation:  Normal Alignment:  Normal Vertebrae: Negative for fracture or mass. Negative for metastatic disease. Anterior spurring and adjacent bone marrow edema on the left at L3-4 appears degenerative. Conus medullaris: Extends to the L1-2 level and appears normal. Paraspinal and other soft tissues: Paraspinous muscles normal. No retroperitoneal mass or adenopathy. Disc levels: L1-2:  Negative L2-3:  Mild disc bulging without significant stenosis. L3-4: Small disc protrusion in the left foramen and lateral to the foramen with displacement  of the left L3 nerve root lateral to the foramen. Mild left foraminal narrowing. Bilateral facet hypertrophy with mild spinal stenosis. Subarticular stenosis on the left. Anterior osteophyte on the left with adjacent bone marrow edema. L4-5: Mild disc bulging and mild facet degeneration. No significant stenosis L5-S1:  Negative IMPRESSION: Small disc protrusion in the left foramen extraforaminal space at L3-4. Mild subarticular and left foraminal narrowing. Mild spinal stenosis. Mild disc and facet degeneration L4-5 without stenosis. Electronically Signed   By: [name] M.D.   On: 10/29/2016 15:29    Assessment & Plan:   There are no diagnoses linked to this encounter. I am having Mr. Zimmerle maintain his cholecalciferol, losartan-hydrochlorothiazide, repaglinide, gabapentin, blood glucose meter kit and supplies, glucose blood, Lancets, metFORMIN, doxycycline, HYDROcodone-acetaminophen, and amLODipine.  No orders of the defined types were placed in this encounter.    Follow-up: No Follow-up on file.  Walker Kehr, MD

## 2017-04-23 NOTE — Assessment & Plan Note (Signed)
   R post shin nodules 4 mm x 2 Will inject

## 2017-04-23 NOTE — Assessment & Plan Note (Signed)
L anterior ankle scaly papule 11x10 mm lesion Will bx if not better w/topical kenalog oint

## 2017-04-23 NOTE — Assessment & Plan Note (Signed)
Benicar HCT; Norvasc Labs

## 2017-04-23 NOTE — Assessment & Plan Note (Signed)
Doing well 

## 2017-04-23 NOTE — Assessment & Plan Note (Signed)
Doing fair 

## 2017-04-23 NOTE — Patient Instructions (Signed)
Skin bx w/me 

## 2017-04-23 NOTE — Progress Notes (Signed)
Pre visit review using our clinic review tool, if applicable. No additional management support is needed unless otherwise documented below in the visit note. 

## 2017-05-15 ENCOUNTER — Other Ambulatory Visit (INDEPENDENT_AMBULATORY_CARE_PROVIDER_SITE_OTHER): Payer: BLUE CROSS/BLUE SHIELD

## 2017-05-15 DIAGNOSIS — G8929 Other chronic pain: Secondary | ICD-10-CM

## 2017-05-15 DIAGNOSIS — M5442 Lumbago with sciatica, left side: Secondary | ICD-10-CM | POA: Diagnosis not present

## 2017-05-15 DIAGNOSIS — E118 Type 2 diabetes mellitus with unspecified complications: Secondary | ICD-10-CM

## 2017-05-15 LAB — CBC WITH DIFFERENTIAL/PLATELET
Basophils Absolute: 0.1 10*3/uL (ref 0.0–0.1)
Basophils Relative: 1.1 % (ref 0.0–3.0)
Eosinophils Absolute: 0.3 10*3/uL (ref 0.0–0.7)
Eosinophils Relative: 4.4 % (ref 0.0–5.0)
HCT: 48.8 % (ref 39.0–52.0)
Hemoglobin: 16.6 g/dL (ref 13.0–17.0)
Lymphocytes Relative: 23.1 % (ref 12.0–46.0)
Lymphs Abs: 1.6 10*3/uL (ref 0.7–4.0)
MCHC: 34.1 g/dL (ref 30.0–36.0)
MCV: 89.4 fl (ref 78.0–100.0)
Monocytes Absolute: 0.6 10*3/uL (ref 0.1–1.0)
Monocytes Relative: 8.2 % (ref 3.0–12.0)
Neutro Abs: 4.4 10*3/uL (ref 1.4–7.7)
Neutrophils Relative %: 63.2 % (ref 43.0–77.0)
Platelets: 150 10*3/uL (ref 150.0–400.0)
RBC: 5.46 Mil/uL (ref 4.22–5.81)
RDW: 13.9 % (ref 11.5–15.5)
WBC: 7 10*3/uL (ref 4.0–10.5)

## 2017-05-15 LAB — COMPREHENSIVE METABOLIC PANEL
ALT: 31 U/L (ref 0–53)
AST: 15 U/L (ref 0–37)
Albumin: 4.1 g/dL (ref 3.5–5.2)
Alkaline Phosphatase: 82 U/L (ref 39–117)
BUN: 18 mg/dL (ref 6–23)
CO2: 29 mEq/L (ref 19–32)
Calcium: 9.2 mg/dL (ref 8.4–10.5)
Chloride: 107 mEq/L (ref 96–112)
Creatinine, Ser: 0.84 mg/dL (ref 0.40–1.50)
GFR: 98.15 mL/min (ref >60.00)
Glucose, Bld: 147 mg/dL — ABNORMAL HIGH (ref 70–99)
Potassium: 3.5 mEq/L (ref 3.5–5.1)
Sodium: 142 mEq/L (ref 135–145)
Total Bilirubin: 0.7 mg/dL (ref 0.2–1.2)
Total Protein: 6.6 g/dL (ref 6.0–8.3)

## 2017-05-15 LAB — HEMOGLOBIN A1C: Hgb A1c MFr Bld: 7 % — ABNORMAL HIGH (ref 4.6–6.5)

## 2017-05-15 LAB — HEPATIC FUNCTION PANEL
ALT: 31 U/L (ref 0–53)
AST: 15 U/L (ref 0–37)
Albumin: 4.1 g/dL (ref 3.5–5.2)
Alkaline Phosphatase: 82 U/L (ref 39–117)
Bilirubin, Direct: 0.2 mg/dL (ref 0.0–0.3)
Total Bilirubin: 0.7 mg/dL (ref 0.2–1.2)
Total Protein: 6.6 g/dL (ref 6.0–8.3)

## 2017-05-19 ENCOUNTER — Encounter: Payer: Self-pay | Admitting: Internal Medicine

## 2017-06-24 ENCOUNTER — Encounter: Payer: Self-pay | Admitting: Internal Medicine

## 2017-06-24 ENCOUNTER — Ambulatory Visit (INDEPENDENT_AMBULATORY_CARE_PROVIDER_SITE_OTHER): Payer: BLUE CROSS/BLUE SHIELD | Admitting: Internal Medicine

## 2017-06-24 ENCOUNTER — Other Ambulatory Visit: Payer: BLUE CROSS/BLUE SHIELD

## 2017-06-24 VITALS — BP 116/74 | HR 60 | Temp 98.3°F | Ht 66.0 in | Wt 192.0 lb

## 2017-06-24 DIAGNOSIS — D485 Neoplasm of uncertain behavior of skin: Secondary | ICD-10-CM

## 2017-06-24 DIAGNOSIS — L814 Other melanin hyperpigmentation: Secondary | ICD-10-CM | POA: Diagnosis not present

## 2017-06-24 DIAGNOSIS — D225 Melanocytic nevi of trunk: Secondary | ICD-10-CM | POA: Diagnosis not present

## 2017-06-24 NOTE — Progress Notes (Signed)
Subjective:  Patient ID: [name] [name], male    DOB: November 08, 1955  Age: 61 y.o. MRN: [id number]  CC: No chief complaint on file.   HPI [name] [name] presents for skin bx  Outpatient Medications Prior to Visit  Medication Sig Dispense Refill  . amLODipine (NORVASC) 5 MG tablet TAKE 1 TABLET (5 MG TOTAL) BY MOUTH DAILY. 90 tablet 1  . blood glucose meter kit and supplies KIT Use to test blood sugar once a day. DX: E11.9 1 each 0  . cholecalciferol (VITAMIN D) 1000 UNITS tablet Take 1,000 Units by mouth daily.    Marland Kitchen glucose blood test strip Use to check blood sugar once per day. 100 each 12  . Lancets MISC Use to check blood sugar once per day. 100 each 3  . losartan-hydrochlorothiazide (HYZAAR) 100-25 MG tablet TAKE 1 TABLET BY MOUTH DAILY. 90 tablet 3  . repaglinide (PRANDIN) 1 MG tablet Take 1 tablet (1 mg total) by mouth 3 (three) times daily before meals. 90 tablet 11  . triamcinolone ointment (KENALOG) 0.1 % Apply 1 application topically 2 (two) times daily. 30 g 2  . doxycycline (VIBRA-TABS) 100 MG tablet Take 1 tablet (100 mg total) by mouth 2 (two) times daily. (Patient not taking: Reported on 04/23/2017) 20 tablet 0  . gabapentin (NEURONTIN) 100 MG capsule Take 1-2 capsules (100-200 mg total) by mouth 3 (three) times daily as needed. (Patient not taking: Reported on 04/23/2017) 90 capsule 3  . HYDROcodone-acetaminophen (NORCO) 7.5-325 MG tablet Take 1 tablet by mouth every 4 (four) hours as needed for moderate pain. (Patient not taking: Reported on 06/24/2017) 40 tablet 0  . metFORMIN (GLUCOPHAGE) 500 MG tablet   4   No facility-administered medications prior to visit.     ROS Review of Systems  Objective:  BP 116/74 (BP Location: Left Arm, Patient Position: Sitting, Cuff Size: Large)   Pulse 60   Temp 98.3 F (36.8 C) (Oral)   Ht '5\' 6"'$  (1.676 m)   Wt 192 lb (87.1 kg)   SpO2 99%   BMI 30.99 kg/m   BP Readings from Last 3 Encounters:  06/24/17 116/74  04/23/17  118/74  01/22/17 134/86    Wt Readings from Last 3 Encounters:  06/24/17 192 lb (87.1 kg)  04/23/17 193 lb (87.5 kg)  01/22/17 189 lb 1.9 oz (85.8 kg)    Physical Exam     Procedure Note :     Procedure :  Skin biopsy   Indication:  Changing mole (s ),  Suspicious lesion(s)   Risks including unsuccessful procedure , bleeding, infection, bruising, scar, a need for another complete procedure and others were explained to the patient in detail as well as the benefits. Informed consent was obtained.  The patient was placed in a decubitus position.  Lesion #1 on  R abd   measuring   3x3  mm   Skin over lesion #1  was prepped with Betadine and alcohol  and anesthetized with 1 cc of 2% lidocaine and epinephrine, using a 25-gauge 1 inch needle.  Shave biopsy with a sterile Dermablade was carried out in the usual fashion. Hyfrecator was used to destroy the rest of the lesion potentially left behind and for hemostasis. Band-Aid was applied with antibiotic ointment.    Lesion #2 on L abd    measuring 3x3  mm   Skin over lesion #2  was prepped with Betadine and alcohol  and anesthetized with 1 cc of 2% lidocaine  and epinephrine, using a 25-gauge 1 inch needle.  Shave biopsy with a sterile Dermablade was carried out in the usual fashion. Hyfrecator was used to destroy the rest of the lesion potentially left behind and for hemostasis. Band-Aid was applied with antibiotic ointment.   Tolerated well. Complications none.      Postprocedure instructions :    A Band-Aid should be  changed twice daily. You can take a shower tomorrow.  Keep the wounds clean. You can wash them with liquid soap and water. Pat dry with gauze or a Kleenex tissue  Before applying antibiotic ointment and a Band-Aid.   You need to report immediately  if fever, chills or any signs of infection develop.    The biopsy results should be available in 1 -2 weeks.   Lab Results  Component Value Date   WBC 7.0  05/15/2017   HGB 16.6 05/15/2017   HCT 48.8 05/15/2017   PLT 150.0 05/15/2017   GLUCOSE 147 (H) 05/15/2017   CHOL 144 06/21/2016   TRIG 133.0 06/21/2016   HDL 29.60 (L) 06/21/2016   LDLCALC 88 06/21/2016   ALT 31 05/15/2017   ALT 31 05/15/2017   AST 15 05/15/2017   AST 15 05/15/2017   NA 142 05/15/2017   K 3.5 05/15/2017   CL 107 05/15/2017   CREATININE 0.84 05/15/2017   BUN 18 05/15/2017   CO2 29 05/15/2017   TSH 0.94 06/21/2016   PSA 2.92 06/21/2016   INR 1.1 (H) 01/08/2012   HGBA1C 7.0 (H) 05/15/2017    [name]  Result Date: 10/29/2016 CLINICAL DATA:  Chronic bilateral low back pain with left-sided sciatica. History of renal cancer EXAM: MRI LUMBAR SPINE WITHOUT CONTRAST TECHNIQUE: Multiplanar, multisequence MR imaging of the lumbar spine was performed. No intravenous contrast was administered. COMPARISON:  Lumbar spine radiographs 09/24/2016 FINDINGS: Segmentation:  Normal Alignment:  Normal Vertebrae: Negative for fracture or mass. Negative for metastatic disease. Anterior spurring and adjacent bone marrow edema on the left at L3-4 appears degenerative. Conus medullaris: Extends to the L1-2 level and appears normal. Paraspinal and other soft tissues: Paraspinous muscles normal. No retroperitoneal mass or adenopathy. Disc levels: L1-2:  Negative L2-3:  Mild disc bulging without significant stenosis. L3-4: Small disc protrusion in the left foramen and lateral to the foramen with displacement of the left L3 nerve root lateral to the foramen. Mild left foraminal narrowing. Bilateral facet hypertrophy with mild spinal stenosis. Subarticular stenosis on the left. Anterior osteophyte on the left with adjacent bone marrow edema. L4-5: Mild disc bulging and mild facet degeneration. No significant stenosis L5-S1:  Negative IMPRESSION: Small disc protrusion in the left foramen extraforaminal space at L3-4. Mild subarticular and left foraminal narrowing. Mild spinal stenosis.  Mild disc and facet degeneration L4-5 without stenosis. Electronically Signed   By: [name] M.D.   On: 10/29/2016 15:29    Assessment & Plan:   There are no diagnoses linked to this encounter. I have discontinued Mr. Gadbois gabapentin, metFORMIN, doxycycline, and HYDROcodone-acetaminophen. I am also having him maintain his cholecalciferol, losartan-hydrochlorothiazide, repaglinide, blood glucose meter kit and supplies, glucose blood, Lancets, amLODipine, and triamcinolone ointment.  No orders of the defined types were placed in this encounter.    Follow-up: No Follow-up on file.  Walker Kehr, MD

## 2017-06-24 NOTE — Assessment & Plan Note (Signed)
See procedure 

## 2017-06-24 NOTE — Patient Instructions (Signed)
Postprocedure instructions :    A Band-Aid should be  changed twice daily. You can take a shower tomorrow.  Keep the wounds clean. You can wash them with liquid soap and water. Pat dry with gauze or a Kleenex tissue  Before applying antibiotic ointment and a Band-Aid.   You need to report immediately  if fever, chills or any signs of infection develop.    The biopsy results should be available in 1 -2 weeks. 

## 2017-07-19 ENCOUNTER — Other Ambulatory Visit: Payer: Self-pay | Admitting: Internal Medicine

## 2017-07-24 ENCOUNTER — Encounter: Payer: Self-pay | Admitting: Internal Medicine

## 2017-07-30 ENCOUNTER — Other Ambulatory Visit: Payer: Self-pay | Admitting: Internal Medicine

## 2017-08-27 ENCOUNTER — Other Ambulatory Visit (INDEPENDENT_AMBULATORY_CARE_PROVIDER_SITE_OTHER): Payer: BLUE CROSS/BLUE SHIELD

## 2017-08-27 DIAGNOSIS — E118 Type 2 diabetes mellitus with unspecified complications: Secondary | ICD-10-CM | POA: Diagnosis not present

## 2017-08-27 LAB — BASIC METABOLIC PANEL
BUN: 17 mg/dL (ref 6–23)
CO2: 29 mEq/L (ref 19–32)
Calcium: 9.1 mg/dL (ref 8.4–10.5)
Chloride: 106 mEq/L (ref 96–112)
Creatinine, Ser: 0.77 mg/dL (ref 0.40–1.50)
GFR: 108.42 mL/min (ref >60.00)
Glucose, Bld: 178 mg/dL — ABNORMAL HIGH (ref 70–99)
Potassium: 3.3 mEq/L — ABNORMAL LOW (ref 3.5–5.1)
Sodium: 141 mEq/L (ref 135–145)

## 2017-08-27 LAB — HEMOGLOBIN A1C: Hgb A1c MFr Bld: 7.4 % — ABNORMAL HIGH (ref 4.6–6.5)

## 2017-08-28 ENCOUNTER — Encounter: Payer: Self-pay | Admitting: Internal Medicine

## 2017-08-28 ENCOUNTER — Ambulatory Visit: Payer: BLUE CROSS/BLUE SHIELD | Admitting: Internal Medicine

## 2017-08-28 DIAGNOSIS — D751 Secondary polycythemia: Secondary | ICD-10-CM

## 2017-08-28 DIAGNOSIS — E118 Type 2 diabetes mellitus with unspecified complications: Secondary | ICD-10-CM | POA: Diagnosis not present

## 2017-08-28 DIAGNOSIS — I10 Essential (primary) hypertension: Secondary | ICD-10-CM

## 2017-08-28 DIAGNOSIS — E876 Hypokalemia: Secondary | ICD-10-CM

## 2017-08-28 DIAGNOSIS — L0591 Pilonidal cyst without abscess: Secondary | ICD-10-CM | POA: Diagnosis not present

## 2017-08-28 DIAGNOSIS — R972 Elevated prostate specific antigen [PSA]: Secondary | ICD-10-CM

## 2017-08-28 MED ORDER — REPAGLINIDE 2 MG PO TABS: 2.0000 mg | ORAL_TABLET | Freq: Three times a day (TID) | ORAL | 11 refills | Status: DC

## 2017-08-28 MED ORDER — LOSARTAN POTASSIUM 100 MG PO TABS: 100.0000 mg | ORAL_TABLET | Freq: Every day | ORAL | 3 refills | Status: DC

## 2017-08-28 MED ORDER — POTASSIUM CHLORIDE ER 8 MEQ PO TBCR: 8.0000 meq | EXTENDED_RELEASE_TABLET | Freq: Every day | ORAL | 11 refills | Status: DC

## 2017-08-28 NOTE — Assessment & Plan Note (Signed)
Prandin 

## 2017-08-28 NOTE — Assessment & Plan Note (Signed)
Benicar HCT; Norvasc 

## 2017-08-28 NOTE — Assessment & Plan Note (Signed)
No relapse 

## 2017-08-28 NOTE — Progress Notes (Signed)
Subjective:  Patient ID: [name] [name], male    DOB: 08-18-1956  Age: 61 y.o. MRN: [id number]  CC: No chief complaint on file.   HPI ED MANDICH presents for HTN, DM, LBP - resolved.  Outpatient Medications Prior to Visit  Medication Sig Dispense Refill  . amLODipine (NORVASC) 5 MG tablet TAKE 1 TABLET (5 MG TOTAL) BY MOUTH DAILY. 90 tablet 1  . blood glucose meter kit and supplies KIT Use to test blood sugar once a day. DX: E11.9 1 each 0  . cholecalciferol (VITAMIN D) 1000 UNITS tablet Take 1,000 Units by mouth daily.    Marland Kitchen glucose blood test strip Use to check blood sugar once per day. 100 each 12  . Lancets MISC Use to check blood sugar once per day. 100 each 3  . losartan-hydrochlorothiazide (HYZAAR) 100-25 MG tablet TAKE 1 TABLET BY MOUTH DAILY. 90 tablet 3  . repaglinide (PRANDIN) 1 MG tablet Take 1 tablet (1 mg total) by mouth 3 (three) times daily before meals. 90 tablet 11  . repaglinide (PRANDIN) 1 MG tablet TAKE 1 TABLET BY MOUTH TWICE A DAY BEFORE A MEAL 60 tablet 11  . repaglinide (PRANDIN) 1 MG tablet TAKE 1 TABLET BY MOUTH TWICE A DAY BEFORE A MEAL 60 tablet 2  . triamcinolone ointment (KENALOG) 0.1 % Apply 1 application topically 2 (two) times daily. 30 g 2   No facility-administered medications prior to visit.     ROS Review of Systems  Constitutional: Negative for appetite change, fatigue and unexpected weight change.  HENT: Negative for congestion, nosebleeds, sneezing, sore throat and trouble swallowing.   Eyes: Negative for itching and visual disturbance.  Respiratory: Negative for cough.   Cardiovascular: Negative for chest pain, palpitations and leg swelling.  Gastrointestinal: Negative for abdominal distention, blood in stool, diarrhea and nausea.  Genitourinary: Negative for frequency and hematuria.  Musculoskeletal: Negative for back pain, gait problem, joint swelling and neck pain.  Skin: Negative for rash.  Neurological: Negative for  dizziness, tremors, speech difficulty and weakness.  Psychiatric/Behavioral: Negative for agitation, dysphoric mood and sleep disturbance. The patient is not nervous/anxious.     Objective:  BP 124/74 (BP Location: Left Arm, Patient Position: Sitting, Cuff Size: Normal)   Pulse 72   Temp 98.2 F (36.8 C) (Oral)   Ht '5\' 6"'$  (1.676 m)   Wt 193 lb (87.5 kg)   SpO2 98%   BMI 31.15 kg/m   BP Readings from Last 3 Encounters:  08/28/17 124/74  06/24/17 116/74  04/23/17 118/74    Wt Readings from Last 3 Encounters:  08/28/17 193 lb (87.5 kg)  06/24/17 192 lb (87.1 kg)  04/23/17 193 lb (87.5 kg)    Physical Exam  Constitutional: He is oriented to person, place, and time. He appears well-developed. No distress.  NAD  HENT:  Mouth/Throat: Oropharynx is clear and moist.  Eyes: Conjunctivae are normal. Pupils are equal, round, and reactive to light.  Neck: Normal range of motion. No JVD present. No thyromegaly present.  Cardiovascular: Normal rate, regular rhythm, normal heart sounds and intact distal pulses. Exam reveals no gallop and no friction rub.  No murmur heard. Pulmonary/Chest: Effort normal and breath sounds normal. No respiratory distress. He has no wheezes. He has no rales. He exhibits no tenderness.  Abdominal: Soft. Bowel sounds are normal. He exhibits no distension and no mass. There is no tenderness. There is no rebound and no guarding.  Musculoskeletal: Normal range of motion. He exhibits  no edema or tenderness.  Lymphadenopathy:    He has no cervical adenopathy.  Neurological: He is alert and oriented to person, place, and time. He has normal reflexes. No cranial nerve deficit. He exhibits normal muscle tone. He displays a negative Romberg sign. Coordination and gait normal.  Skin: Skin is warm and dry. No rash noted.  Psychiatric: He has a normal mood and affect. His behavior is normal. Judgment and thought content normal.    Lab Results  Component Value Date    WBC 7.0 05/15/2017   HGB 16.6 05/15/2017   HCT 48.8 05/15/2017   PLT 150.0 05/15/2017   GLUCOSE 178 (H) 08/27/2017   CHOL 144 06/21/2016   TRIG 133.0 06/21/2016   HDL 29.60 (L) 06/21/2016   LDLCALC 88 06/21/2016   ALT 31 05/15/2017   ALT 31 05/15/2017   AST 15 05/15/2017   AST 15 05/15/2017   NA 141 08/27/2017   K 3.3 (L) 08/27/2017   CL 106 08/27/2017   CREATININE 0.77 08/27/2017   BUN 17 08/27/2017   CO2 29 08/27/2017   TSH 0.94 06/21/2016   PSA 2.92 06/21/2016   INR 1.1 (H) 01/08/2012   HGBA1C 7.4 (H) 08/27/2017    [name]  Result Date: 10/29/2016 CLINICAL DATA:  Chronic bilateral low back pain with left-sided sciatica. History of renal cancer EXAM: MRI LUMBAR SPINE WITHOUT CONTRAST TECHNIQUE: Multiplanar, multisequence MR imaging of the lumbar spine was performed. No intravenous contrast was administered. COMPARISON:  Lumbar spine radiographs 09/24/2016 FINDINGS: Segmentation:  Normal Alignment:  Normal Vertebrae: Negative for fracture or mass. Negative for metastatic disease. Anterior spurring and adjacent bone marrow edema on the left at L3-4 appears degenerative. Conus medullaris: Extends to the L1-2 level and appears normal. Paraspinal and other soft tissues: Paraspinous muscles normal. No retroperitoneal mass or adenopathy. Disc levels: L1-2:  Negative L2-3:  Mild disc bulging without significant stenosis. L3-4: Small disc protrusion in the left foramen and lateral to the foramen with displacement of the left L3 nerve root lateral to the foramen. Mild left foraminal narrowing. Bilateral facet hypertrophy with mild spinal stenosis. Subarticular stenosis on the left. Anterior osteophyte on the left with adjacent bone marrow edema. L4-5: Mild disc bulging and mild facet degeneration. No significant stenosis L5-S1:  Negative IMPRESSION: Small disc protrusion in the left foramen extraforaminal space at L3-4. Mild subarticular and left foraminal narrowing. Mild  spinal stenosis. Mild disc and facet degeneration L4-5 without stenosis. Electronically Signed   By: [name] M.D.   On: 10/29/2016 15:29    Assessment & Plan:   There are no diagnoses linked to this encounter. I am having [name] [name] maintain his cholecalciferol, losartan-hydrochlorothiazide, repaglinide, blood glucose meter kit and supplies, glucose blood, Lancets, amLODipine, triamcinolone ointment, repaglinide, and repaglinide.  No orders of the defined types were placed in this encounter.    Follow-up: No Follow-up on file.  Walker Kehr, MD

## 2017-08-28 NOTE — Assessment & Plan Note (Signed)
D/c Losart HCTZ KCl x 1 month

## 2017-08-28 NOTE — Assessment & Plan Note (Signed)
Per Dr Jeffie Pollock

## 2017-08-28 NOTE — Assessment & Plan Note (Signed)
Labs

## 2017-11-29 ENCOUNTER — Ambulatory Visit: Payer: BLUE CROSS/BLUE SHIELD | Admitting: Internal Medicine

## 2017-12-02 ENCOUNTER — Telehealth: Payer: Self-pay | Admitting: Internal Medicine

## 2017-12-02 DIAGNOSIS — E118 Type 2 diabetes mellitus with unspecified complications: Secondary | ICD-10-CM

## 2017-12-02 DIAGNOSIS — E785 Hyperlipidemia, unspecified: Secondary | ICD-10-CM

## 2017-12-02 NOTE — Telephone Encounter (Signed)
Labs entered, pt notified

## 2017-12-02 NOTE — Telephone Encounter (Signed)
Copied from [place] [id number]. Topic: Quick Communication - See Telephone Encounter >> Dec 02, 2017  3:43 PM Boyd Kerbs wrote: CRM for notification. See Telephone encounter for:   Pt is needing lab work done and needs order in.  Pt. Is wanting to come in on Friday 2/22    12/02/17.

## 2017-12-04 ENCOUNTER — Ambulatory Visit: Payer: BLUE CROSS/BLUE SHIELD | Admitting: Internal Medicine

## 2017-12-10 ENCOUNTER — Ambulatory Visit: Payer: BLUE CROSS/BLUE SHIELD | Admitting: Internal Medicine

## 2017-12-10 DIAGNOSIS — Z0289 Encounter for other administrative examinations: Secondary | ICD-10-CM

## 2017-12-11 ENCOUNTER — Other Ambulatory Visit (INDEPENDENT_AMBULATORY_CARE_PROVIDER_SITE_OTHER): Payer: BLUE CROSS/BLUE SHIELD

## 2017-12-11 ENCOUNTER — Encounter: Payer: Self-pay | Admitting: Internal Medicine

## 2017-12-11 DIAGNOSIS — E785 Hyperlipidemia, unspecified: Secondary | ICD-10-CM | POA: Diagnosis not present

## 2017-12-11 DIAGNOSIS — E118 Type 2 diabetes mellitus with unspecified complications: Secondary | ICD-10-CM | POA: Diagnosis not present

## 2017-12-11 LAB — CBC WITH DIFFERENTIAL/PLATELET
Basophils Absolute: 0.1 10*3/uL (ref 0.0–0.1)
Basophils Relative: 1.2 % (ref 0.0–3.0)
Eosinophils Absolute: 0.3 10*3/uL (ref 0.0–0.7)
Eosinophils Relative: 3.5 % (ref 0.0–5.0)
HCT: 48.4 % (ref 39.0–52.0)
Hemoglobin: 16.8 g/dL (ref 13.0–17.0)
Lymphocytes Relative: 20.7 % (ref 12.0–46.0)
Lymphs Abs: 1.5 10*3/uL (ref 0.7–4.0)
MCHC: 34.6 g/dL (ref 30.0–36.0)
MCV: 86.8 fl (ref 78.0–100.0)
Monocytes Absolute: 0.6 10*3/uL (ref 0.1–1.0)
Monocytes Relative: 7.9 % (ref 3.0–12.0)
Neutro Abs: 5 10*3/uL (ref 1.4–7.7)
Neutrophils Relative %: 66.7 % (ref 43.0–77.0)
Platelets: 163 10*3/uL (ref 150.0–400.0)
RBC: 5.58 Mil/uL (ref 4.22–5.81)
RDW: 14.3 % (ref 11.5–15.5)
WBC: 7.5 10*3/uL (ref 4.0–10.5)

## 2017-12-11 LAB — LIPID PANEL
Cholesterol: 123 mg/dL (ref 0–200)
HDL: 32.5 mg/dL — ABNORMAL LOW (ref >39.00)
LDL Cholesterol: 79 mg/dL (ref 0–99)
NonHDL: 90.66
Total CHOL/HDL Ratio: 4
Triglycerides: 56 mg/dL (ref 0.0–149.0)
VLDL: 11.2 mg/dL (ref 0.0–40.0)

## 2017-12-11 LAB — COMPREHENSIVE METABOLIC PANEL
ALT: 32 U/L (ref 0–53)
AST: 17 U/L (ref 0–37)
Albumin: 4 g/dL (ref 3.5–5.2)
Alkaline Phosphatase: 108 U/L (ref 39–117)
BUN: 14 mg/dL (ref 6–23)
CO2: 27 mEq/L (ref 19–32)
Calcium: 9.5 mg/dL (ref 8.4–10.5)
Chloride: 106 mEq/L (ref 96–112)
Creatinine, Ser: 0.86 mg/dL (ref 0.40–1.50)
GFR: 95.35 mL/min (ref >60.00)
Glucose, Bld: 163 mg/dL — ABNORMAL HIGH (ref 70–99)
Potassium: 3.6 mEq/L (ref 3.5–5.1)
Sodium: 143 mEq/L (ref 135–145)
Total Bilirubin: 0.4 mg/dL (ref 0.2–1.2)
Total Protein: 6.7 g/dL (ref 6.0–8.3)

## 2017-12-11 LAB — HEMOGLOBIN A1C: Hgb A1c MFr Bld: 6.8 % — ABNORMAL HIGH (ref 4.6–6.5)

## 2017-12-12 ENCOUNTER — Other Ambulatory Visit: Payer: Self-pay | Admitting: Internal Medicine

## 2017-12-12 MED ORDER — LOSARTAN POTASSIUM 100 MG PO TABS: 100.0000 mg | ORAL_TABLET | Freq: Every day | ORAL | 0 refills | Status: DC

## 2017-12-12 MED ORDER — REPAGLINIDE 2 MG PO TABS: 2.0000 mg | ORAL_TABLET | Freq: Three times a day (TID) | ORAL | 2 refills | Status: DC

## 2017-12-12 MED ORDER — AMLODIPINE BESYLATE 5 MG PO TABS: 5.0000 mg | ORAL_TABLET | Freq: Every day | ORAL | 0 refills | Status: DC

## 2017-12-12 MED ORDER — REPAGLINIDE 2 MG PO TABS: 2.0000 mg | ORAL_TABLET | Freq: Three times a day (TID) | ORAL | 3 refills | Status: DC

## 2017-12-12 MED ORDER — LOSARTAN POTASSIUM 100 MG PO TABS: 100.0000 mg | ORAL_TABLET | Freq: Every day | ORAL | 3 refills | Status: DC

## 2017-12-12 MED ORDER — AMLODIPINE BESYLATE 5 MG PO TABS: 5.0000 mg | ORAL_TABLET | Freq: Every day | ORAL | 3 refills | Status: DC

## 2017-12-17 ENCOUNTER — Encounter: Payer: Self-pay | Admitting: Internal Medicine

## 2018-01-07 ENCOUNTER — Ambulatory Visit: Payer: BLUE CROSS/BLUE SHIELD | Admitting: Internal Medicine

## 2018-01-07 ENCOUNTER — Encounter: Payer: Self-pay | Admitting: Internal Medicine

## 2018-01-07 DIAGNOSIS — D751 Secondary polycythemia: Secondary | ICD-10-CM

## 2018-01-07 DIAGNOSIS — I10 Essential (primary) hypertension: Secondary | ICD-10-CM | POA: Diagnosis not present

## 2018-01-07 DIAGNOSIS — E785 Hyperlipidemia, unspecified: Secondary | ICD-10-CM

## 2018-01-07 DIAGNOSIS — E118 Type 2 diabetes mellitus with unspecified complications: Secondary | ICD-10-CM | POA: Diagnosis not present

## 2018-01-07 DIAGNOSIS — E278 Other specified disorders of adrenal gland: Secondary | ICD-10-CM

## 2018-01-07 DIAGNOSIS — E279 Disorder of adrenal gland, unspecified: Secondary | ICD-10-CM | POA: Diagnosis not present

## 2018-01-07 NOTE — Progress Notes (Signed)
Subjective:  Patient ID: [name] [name], male    DOB: October 24, 1955  Age: 62 y.o. MRN: [id number]  CC: No chief complaint on file.   HPI [name] [name] presents for HTN, CRI, DM2 f/u  Outpatient Medications Prior to Visit  Medication Sig Dispense Refill  . amLODipine (NORVASC) 5 MG tablet Take 1 tablet (5 mg total) by mouth daily. 90 tablet 3  . blood glucose meter kit and supplies KIT Use to test blood sugar once a day. DX: E11.9 1 each 0  . cholecalciferol (VITAMIN D) 1000 UNITS tablet Take 1,000 Units by mouth daily.    Marland Kitchen glucose blood test strip Use to check blood sugar once per day. 100 each 12  . Lancets MISC Use to check blood sugar once per day. 100 each 3  . losartan (COZAAR) 100 MG tablet Take 1 tablet (100 mg total) by mouth daily. 90 tablet 3  . repaglinide (PRANDIN) 2 MG tablet Take 1 tablet (2 mg total) by mouth 3 (three) times daily before meals. 270 tablet 3  . triamcinolone ointment (KENALOG) 0.1 % Apply 1 application topically 2 (two) times daily. 30 g 2  . potassium chloride (KLOR-CON) 8 MEQ tablet Take 1 tablet (8 mEq total) daily by mouth. (Patient not taking: Reported on 01/07/2018) 30 tablet 11   No facility-administered medications prior to visit.     ROS Review of Systems  Constitutional: Negative for appetite change, fatigue and unexpected weight change.  HENT: Negative for congestion, nosebleeds, sneezing, sore throat and trouble swallowing.   Eyes: Negative for itching and visual disturbance.  Respiratory: Negative for cough.   Cardiovascular: Negative for chest pain, palpitations and leg swelling.  Gastrointestinal: Negative for abdominal distention, blood in stool, diarrhea and nausea.  Genitourinary: Negative for frequency and hematuria.  Musculoskeletal: Negative for back pain, gait problem, joint swelling and neck pain.  Skin: Negative for rash.  Neurological: Negative for dizziness, tremors, speech difficulty and weakness.    Psychiatric/Behavioral: Negative for agitation, dysphoric mood and sleep disturbance. The patient is not nervous/anxious.     Objective:  BP 126/82 (BP Location: Left Arm, Patient Position: Sitting, Cuff Size: Large)   Pulse 66   Temp 98.2 F (36.8 C) (Oral)   Ht '5\' 6"'$  (1.676 m)   Wt 198 lb (89.8 kg)   SpO2 99%   BMI 31.96 kg/m   BP Readings from Last 3 Encounters:  01/07/18 126/82  08/28/17 124/74  06/24/17 116/74    Wt Readings from Last 3 Encounters:  01/07/18 198 lb (89.8 kg)  08/28/17 193 lb (87.5 kg)  06/24/17 192 lb (87.1 kg)    Physical Exam  Constitutional: He is oriented to person, place, and time. He appears well-developed. No distress.  NAD  HENT:  Mouth/Throat: Oropharynx is clear and moist.  Eyes: Pupils are equal, round, and reactive to light. Conjunctivae are normal.  Neck: Normal range of motion. No JVD present. No thyromegaly present.  Cardiovascular: Normal rate, regular rhythm, normal heart sounds and intact distal pulses. Exam reveals no gallop and no friction rub.  No murmur heard. Pulmonary/Chest: Effort normal and breath sounds normal. No respiratory distress. He has no wheezes. He has no rales. He exhibits no tenderness.  Abdominal: Soft. Bowel sounds are normal. He exhibits no distension and no mass. There is no tenderness. There is no rebound and no guarding.  Musculoskeletal: Normal range of motion. He exhibits no edema or tenderness.  Lymphadenopathy:    He has no cervical  adenopathy.  Neurological: He is alert and oriented to person, place, and time. He has normal reflexes. No cranial nerve deficit. He exhibits normal muscle tone. He displays a negative Romberg sign. Coordination and gait normal.  Skin: Skin is warm and dry. No rash noted.  Psychiatric: He has a normal mood and affect. His behavior is normal. Judgment and thought content normal.    Lab Results  Component Value Date   WBC 7.5 12/11/2017   HGB 16.8 12/11/2017   HCT 48.4  12/11/2017   PLT 163.0 12/11/2017   GLUCOSE 163 (H) 12/11/2017   CHOL 123 12/11/2017   TRIG 56.0 12/11/2017   HDL 32.50 (L) 12/11/2017   LDLCALC 79 12/11/2017   ALT 32 12/11/2017   AST 17 12/11/2017   NA 143 12/11/2017   K 3.6 12/11/2017   CL 106 12/11/2017   CREATININE 0.86 12/11/2017   BUN 14 12/11/2017   CO2 27 12/11/2017   TSH 0.94 06/21/2016   PSA 2.92 06/21/2016   INR 1.1 (H) 01/08/2012   HGBA1C 6.8 (H) 12/11/2017    [name]  Result Date: 10/29/2016 CLINICAL DATA:  Chronic bilateral low back pain with left-sided sciatica. History of renal cancer EXAM: MRI LUMBAR SPINE WITHOUT CONTRAST TECHNIQUE: Multiplanar, multisequence MR imaging of the lumbar spine was performed. No intravenous contrast was administered. COMPARISON:  Lumbar spine radiographs 09/24/2016 FINDINGS: Segmentation:  Normal Alignment:  Normal Vertebrae: Negative for fracture or mass. Negative for metastatic disease. Anterior spurring and adjacent bone marrow edema on the left at L3-4 appears degenerative. Conus medullaris: Extends to the L1-2 level and appears normal. Paraspinal and other soft tissues: Paraspinous muscles normal. No retroperitoneal mass or adenopathy. Disc levels: L1-2:  Negative L2-3:  Mild disc bulging without significant stenosis. L3-4: Small disc protrusion in the left foramen and lateral to the foramen with displacement of the left L3 nerve root lateral to the foramen. Mild left foraminal narrowing. Bilateral facet hypertrophy with mild spinal stenosis. Subarticular stenosis on the left. Anterior osteophyte on the left with adjacent bone marrow edema. L4-5: Mild disc bulging and mild facet degeneration. No significant stenosis L5-S1:  Negative IMPRESSION: Small disc protrusion in the left foramen extraforaminal space at L3-4. Mild subarticular and left foraminal narrowing. Mild spinal stenosis. Mild disc and facet degeneration L4-5 without stenosis. Electronically Signed   By:  [name] M.D.   On: 10/29/2016 15:29    Assessment & Plan:   There are no diagnoses linked to this encounter. I have discontinued [name] [name]'s potassium chloride. I am also having him maintain his cholecalciferol, blood glucose meter kit and supplies, glucose blood, Lancets, triamcinolone ointment, amLODipine, losartan, and repaglinide.  No orders of the defined types were placed in this encounter.    Follow-up: No follow-ups on file.  Walker Kehr, MD

## 2018-01-07 NOTE — Assessment & Plan Note (Signed)
Monitoring labs 

## 2018-01-07 NOTE — Assessment & Plan Note (Signed)
Declined statins. 

## 2018-01-07 NOTE — Assessment & Plan Note (Signed)
Prandin 

## 2018-01-07 NOTE — Assessment & Plan Note (Signed)
Benicar HCT; Norvasc 

## 2018-01-07 NOTE — Assessment & Plan Note (Signed)
S/p R partial nephrectomy for renal cell cancer 10/13 Dr Jeffie Pollock

## 2018-02-19 ENCOUNTER — Encounter: Payer: Self-pay | Admitting: Internal Medicine

## 2018-04-08 ENCOUNTER — Other Ambulatory Visit (INDEPENDENT_AMBULATORY_CARE_PROVIDER_SITE_OTHER): Payer: BLUE CROSS/BLUE SHIELD

## 2018-04-08 DIAGNOSIS — E118 Type 2 diabetes mellitus with unspecified complications: Secondary | ICD-10-CM

## 2018-04-08 LAB — BASIC METABOLIC PANEL
BUN: 14 mg/dL (ref 6–23)
CO2: 29 mEq/L (ref 19–32)
Calcium: 9.3 mg/dL (ref 8.4–10.5)
Chloride: 105 mEq/L (ref 96–112)
Creatinine, Ser: 0.79 mg/dL (ref 0.40–1.50)
GFR: 105.05 mL/min (ref >60.00)
Glucose, Bld: 180 mg/dL — ABNORMAL HIGH (ref 70–99)
Potassium: 3.1 mEq/L — ABNORMAL LOW (ref 3.5–5.1)
Sodium: 141 mEq/L (ref 135–145)

## 2018-04-08 LAB — HEMOGLOBIN A1C: Hgb A1c MFr Bld: 7.1 % — ABNORMAL HIGH (ref 4.6–6.5)

## 2018-04-09 ENCOUNTER — Encounter: Payer: Self-pay | Admitting: Internal Medicine

## 2018-04-09 ENCOUNTER — Ambulatory Visit: Payer: BLUE CROSS/BLUE SHIELD | Admitting: Internal Medicine

## 2018-04-09 DIAGNOSIS — D751 Secondary polycythemia: Secondary | ICD-10-CM

## 2018-04-09 DIAGNOSIS — B351 Tinea unguium: Secondary | ICD-10-CM | POA: Diagnosis not present

## 2018-04-09 DIAGNOSIS — E876 Hypokalemia: Secondary | ICD-10-CM

## 2018-04-09 DIAGNOSIS — I10 Essential (primary) hypertension: Secondary | ICD-10-CM | POA: Diagnosis not present

## 2018-04-09 DIAGNOSIS — E118 Type 2 diabetes mellitus with unspecified complications: Secondary | ICD-10-CM

## 2018-04-09 MED ORDER — POTASSIUM CHLORIDE CRYS ER 20 MEQ PO TBCR: 20.0000 meq | EXTENDED_RELEASE_TABLET | Freq: Every day | ORAL | 3 refills | Status: DC

## 2018-04-09 MED ORDER — METFORMIN HCL 500 MG PO TABS: 500.0000 mg | ORAL_TABLET | Freq: Every day | ORAL | 3 refills | Status: DC

## 2018-04-09 NOTE — Progress Notes (Signed)
Subjective:  Patient ID: [name] [name], male    DOB: 1956/09/09  Age: 62 y.o. MRN: [id number]  CC: No chief complaint on file.   HPI [name] [name] presents for DM, HTN, polycythemia f/u C/o URI sx's x3 d - better  Outpatient Medications Prior to Visit  Medication Sig Dispense Refill  . amLODipine (NORVASC) 5 MG tablet Take 1 tablet (5 mg total) by mouth daily. 90 tablet 3  . blood glucose meter kit and supplies KIT Use to test blood sugar once a day. DX: E11.9 1 each 0  . cholecalciferol (VITAMIN D) 1000 UNITS tablet Take 1,000 Units by mouth daily.    Marland Kitchen glucose blood test strip Use to check blood sugar once per day. 100 each 12  . Lancets MISC Use to check blood sugar once per day. 100 each 3  . losartan (COZAAR) 100 MG tablet Take 1 tablet (100 mg total) by mouth daily. 90 tablet 3  . repaglinide (PRANDIN) 2 MG tablet Take 1 tablet (2 mg total) by mouth 3 (three) times daily before meals. 270 tablet 3  . triamcinolone ointment (KENALOG) 0.1 % Apply 1 application topically 2 (two) times daily. 30 g 2   No facility-administered medications prior to visit.     ROS: Review of Systems  Constitutional: Negative for appetite change, fatigue and unexpected weight change.  HENT: Negative for congestion, nosebleeds, sneezing, sore throat and trouble swallowing.   Eyes: Negative for itching and visual disturbance.  Respiratory: Positive for cough.   Cardiovascular: Negative for chest pain, palpitations and leg swelling.  Gastrointestinal: Negative for abdominal distention, blood in stool, diarrhea and nausea.  Genitourinary: Negative for frequency and hematuria.  Musculoskeletal: Negative for back pain, gait problem, joint swelling and neck pain.  Skin: Negative for rash.  Neurological: Negative for dizziness, tremors, speech difficulty and weakness.  Psychiatric/Behavioral: Negative for agitation, dysphoric mood and sleep disturbance. The patient is not nervous/anxious.      Objective:  BP 126/74 (BP Location: Left Arm, Patient Position: Sitting, Cuff Size: Large)   Pulse 75   Temp 98.4 F (36.9 C) (Oral)   Ht '5\' 6"'$  (1.676 m)   Wt 196 lb (88.9 kg)   SpO2 98%   BMI 31.64 kg/m   BP Readings from Last 3 Encounters:  04/09/18 126/74  01/07/18 126/82  08/28/17 124/74    Wt Readings from Last 3 Encounters:  04/09/18 196 lb (88.9 kg)  01/07/18 198 lb (89.8 kg)  08/28/17 193 lb (87.5 kg)    Physical Exam  Constitutional: He is oriented to person, place, and time. He appears well-developed. No distress.  NAD  HENT:  Mouth/Throat: Oropharynx is clear and moist.  Eyes: Pupils are equal, round, and reactive to light. Conjunctivae are normal.  Neck: Normal range of motion. No JVD present. No thyromegaly present.  Cardiovascular: Normal rate, regular rhythm, normal heart sounds and intact distal pulses. Exam reveals no gallop and no friction rub.  No murmur heard. Pulmonary/Chest: Effort normal and breath sounds normal. No respiratory distress. He has no wheezes. He has no rales. He exhibits no tenderness.  Abdominal: Soft. Bowel sounds are normal. He exhibits no distension and no mass. There is no tenderness. There is no rebound and no guarding.  Musculoskeletal: Normal range of motion. He exhibits no edema or tenderness.  Lymphadenopathy:    He has no cervical adenopathy.  Neurological: He is alert and oriented to person, place, and time. He has normal reflexes. No cranial nerve  deficit. He exhibits normal muscle tone. He displays a negative Romberg sign. Coordination and gait normal.  Skin: Skin is warm and dry. No rash noted.  Psychiatric: He has a normal mood and affect. His behavior is normal. Judgment and thought content normal.  B onycho - severe Obese  Lab Results  Component Value Date   WBC 7.5 12/11/2017   HGB 16.8 12/11/2017   HCT 48.4 12/11/2017   PLT 163.0 12/11/2017   GLUCOSE 180 (H) 04/08/2018   CHOL 123 12/11/2017   TRIG 56.0  12/11/2017   HDL 32.50 (L) 12/11/2017   LDLCALC 79 12/11/2017   ALT 32 12/11/2017   AST 17 12/11/2017   NA 141 04/08/2018   K 3.1 (L) 04/08/2018   CL 105 04/08/2018   CREATININE 0.79 04/08/2018   BUN 14 04/08/2018   CO2 29 04/08/2018   TSH 0.94 06/21/2016   PSA 2.92 06/21/2016   INR 1.1 (H) 01/08/2012   HGBA1C 7.1 (H) 04/08/2018    [name]  Result Date: 10/29/2016 CLINICAL DATA:  Chronic bilateral low back pain with left-sided sciatica. History of renal cancer EXAM: MRI LUMBAR SPINE WITHOUT CONTRAST TECHNIQUE: Multiplanar, multisequence MR imaging of the lumbar spine was performed. No intravenous contrast was administered. COMPARISON:  Lumbar spine radiographs 09/24/2016 FINDINGS: Segmentation:  Normal Alignment:  Normal Vertebrae: Negative for fracture or mass. Negative for metastatic disease. Anterior spurring and adjacent bone marrow edema on the left at L3-4 appears degenerative. Conus medullaris: Extends to the L1-2 level and appears normal. Paraspinal and other soft tissues: Paraspinous muscles normal. No retroperitoneal mass or adenopathy. Disc levels: L1-2:  Negative L2-3:  Mild disc bulging without significant stenosis. L3-4: Small disc protrusion in the left foramen and lateral to the foramen with displacement of the left L3 nerve root lateral to the foramen. Mild left foraminal narrowing. Bilateral facet hypertrophy with mild spinal stenosis. Subarticular stenosis on the left. Anterior osteophyte on the left with adjacent bone marrow edema. L4-5: Mild disc bulging and mild facet degeneration. No significant stenosis L5-S1:  Negative IMPRESSION: Small disc protrusion in the left foramen extraforaminal space at L3-4. Mild subarticular and left foraminal narrowing. Mild spinal stenosis. Mild disc and facet degeneration L4-5 without stenosis. Electronically Signed   By: [name] [name] M.D.   On: 10/29/2016 15:29    Assessment & Plan:   There are no diagnoses linked  to this encounter.   No orders of the defined types were placed in this encounter.    Follow-up: No follow-ups on file.  Walker Kehr, MD

## 2018-04-09 NOTE — Patient Instructions (Signed)
You can use over-the-counter  "cold" medicines  such as " Delsym" or" Robitussin" cough syrup varietis for cough.  You can use plain "Tylenol" or "Advil" for fever, chills and achyness. Use Halls or Ricola cough drops. ° ° "Common cold" symptoms are usually triggered by a virus.  The antibiotics are usually not necessary. On average, a" viral cold" illness would take 4-7 days to resolve. ° ° Please, make an appointment if you are not better or if you're worse. ° °

## 2018-04-09 NOTE — Assessment & Plan Note (Signed)
Benicar HCT; Norvasc

## 2018-04-09 NOTE — Assessment & Plan Note (Signed)
CBC

## 2018-04-09 NOTE — Assessment & Plan Note (Addendum)
The pt stopped Metformin due to the news 2017 Worse - re-start Metformin  Prandin

## 2018-04-09 NOTE — Assessment & Plan Note (Signed)
Worse. Increase KCl to 20 meq qd

## 2018-04-09 NOTE — Assessment & Plan Note (Addendum)
Podiatry ref Dr Jacqualyn Posey - ?removal

## 2018-05-03 ENCOUNTER — Encounter: Payer: Self-pay | Admitting: Internal Medicine

## 2018-05-05 ENCOUNTER — Encounter: Payer: Self-pay | Admitting: Internal Medicine

## 2018-07-08 ENCOUNTER — Encounter: Payer: Self-pay | Admitting: Podiatry

## 2018-07-08 ENCOUNTER — Ambulatory Visit: Payer: BLUE CROSS/BLUE SHIELD | Admitting: Podiatry

## 2018-07-08 VITALS — BP 169/89 | HR 67

## 2018-07-08 DIAGNOSIS — B351 Tinea unguium: Secondary | ICD-10-CM | POA: Diagnosis not present

## 2018-07-09 DIAGNOSIS — L608 Other nail disorders: Secondary | ICD-10-CM | POA: Diagnosis not present

## 2018-07-09 DIAGNOSIS — B351 Tinea unguium: Secondary | ICD-10-CM | POA: Diagnosis not present

## 2018-07-09 NOTE — Progress Notes (Signed)
Subjective:   Patient ID: [name] [name], male   DOB: 62 y.o.   MRN: [id number]   HPI 62 year old male presents the office with concerns of toenail fungus which is been ongoing about 20 years.  He states he was on itraconazole walking 2-3 times for a yeast infection of the toenails.  He also treated with topical ointments which did not help.  He states he has been thinking to have the toenails removed he is not quite ready to have this done.  Denies any pain or swelling or any redness or drainage the toenail sites.  No other concerns.   Review of Systems  All other systems reviewed and are negative.  Past Medical History:  Diagnosis Date  . Cancer ([place]) 07/28/2012   kidney  . Diabetes mellitus type II   . Difficult intubation 07/28/2012   hard to place breathing tube  . Elevated glucose   . Elevated PSA   . GERD (gastroesophageal reflux disease)   . Herniated cervical disc    no current pain  . History of kidney stones   . Hyperlipidemia   . Hypertension   . Osteoarthritis   . Renal neoplasm    RT  . Substance abuse [facility])     Past Surgical History:  Procedure Laterality Date  . COLONOSCOPY N/A 03/03/2013   Procedure: COLONOSCOPY;  Surgeon: Irene Shipper, MD;  Location: [facility] ENDOSCOPY;  Service: Endoscopy;  Laterality: N/A;  . KIDNEY SURGERY Right 07/28/12   Had 10% removed for cancer   . TONSILLECTOMY  1965     Current Outpatient Medications:  .  amLODipine (NORVASC) 5 MG tablet, Take 1 tablet (5 mg total) by mouth daily., Disp: 90 tablet, Rfl: 3 .  blood glucose meter kit and supplies KIT, Use to test blood sugar once a day. DX: E11.9, Disp: 1 each, Rfl: 0 .  cholecalciferol (VITAMIN D) 1000 UNITS tablet, Take 1,000 Units by mouth daily., Disp: , Rfl:  .  glucose blood test strip, Use to check blood sugar once per day., Disp: 100 each, Rfl: 12 .  Lancets MISC, Use to check blood sugar once per day., Disp: 100 each, Rfl: 3 .  losartan (COZAAR) 100 MG tablet, Take 1 tablet  (100 mg total) by mouth daily. (Patient not taking: Reported on 07/08/2018), Disp: 90 tablet, Rfl: 3 .  metFORMIN (GLUCOPHAGE) 500 MG tablet, Take 1 tablet (500 mg total) by mouth daily with breakfast., Disp: 90 tablet, Rfl: 3 .  potassium chloride SA (K-DUR,KLOR-CON) 20 MEQ tablet, Take 1 tablet (20 mEq total) by mouth daily., Disp: 90 tablet, Rfl: 3 .  repaglinide (PRANDIN) 2 MG tablet, Take 1 tablet (2 mg total) by mouth 3 (three) times daily before meals., Disp: 270 tablet, Rfl: 3 .  triamcinolone ointment (KENALOG) 0.1 %, Apply 1 application topically 2 (two) times daily., Disp: 30 g, Rfl: 2  Allergies  Allergen Reactions  . Codeine Sulfate Itching  . Tamsulosin Other (See Comments)    Unable to perform sexually        Objective:  Physical Exam  General: AAO x3, NAD  Dermatological: The toenails are dystrophic, discolored with yellow to brown discoloration.  There is no pain in the nails there is no surrounding redness or drainage or any clinical signs of infection present today.  No open lesions.  Vascular: Dorsalis Pedis artery and Posterior Tibial artery pedal pulses are 2/4 bilateral with immedate capillary fill time. There is no pain with calf compression, swelling, warmth,  erythema.   Neruologic: Grossly intact via light touch bilateral.Protective threshold with Semmes Wienstein monofilament intact to all pedal sites bilateral.   Musculoskeletal: No gross boney pedal deformities bilateral. No pain, crepitus, or limitation noted with foot and ankle range of motion bilateral. Muscular strength 5/5 in all groups tested bilateral.  Gait: Unassisted, Nonantalgic.    Assessment:   Onychomycosis     Plan:  -Treatment options discussed including all alternatives, risks, and complications -Etiology of symptoms were discussed -I do think that he has a component of fungus but also very damaged toenails.  He had multiple treatments a significant improvement.  Discussed with him  toenail removal he is not quite ready to do this.  Also he has had multiple treatment so because of this I am going to culture the toenail.  I debrided the nails today and I sent for culture to [facility] labs.  Specimen was given to Cranford Mon, CMA -Follow-up with your nail culture sooner if needed.  Trula Slade DPM

## 2018-07-16 ENCOUNTER — Ambulatory Visit: Payer: BLUE CROSS/BLUE SHIELD | Admitting: Internal Medicine

## 2018-07-22 ENCOUNTER — Telehealth: Payer: Self-pay | Admitting: *Deleted

## 2018-07-22 NOTE — Telephone Encounter (Signed)
Left message for pt to call for results  

## 2018-07-22 NOTE — Telephone Encounter (Signed)
-----   Message from Trula Slade, DPM sent at 07/22/2018 12:50 PM EDT ----- VAl- please let him know that the culture did not show fungus but did show that he likely had it before but the nail is unfortunately damaged at this point. Would try urea cream. If he wanted to do antifungal medication again would do the topical through [facility] with the acid to help thin it. Thanks.

## 2018-07-24 NOTE — Telephone Encounter (Signed)
Left message on Mobile and  Home phone requesting call back to discuss results.

## 2018-08-04 ENCOUNTER — Telehealth: Payer: Self-pay | Admitting: *Deleted

## 2018-08-04 ENCOUNTER — Encounter: Payer: Self-pay | Admitting: *Deleted

## 2018-08-04 NOTE — Telephone Encounter (Signed)
-----   Message from Trula Slade, DPM sent at 07/22/2018 12:50 PM EDT ----- VAl- please let him know that the culture did not show fungus but did show that he likely had it before but the nail is unfortunately damaged at this point. Would try urea cream. If he wanted to do antifungal medication again would do the topical through [facility] with the acid to help thin it. Thanks.

## 2018-08-04 NOTE — Telephone Encounter (Signed)
Left message on pt's home and mobile phone to call for results. Mailed letter explaining results to pt and requesting call back to discuss treatment.

## 2018-08-07 ENCOUNTER — Encounter: Payer: Self-pay | Admitting: Podiatry

## 2018-08-07 NOTE — Telephone Encounter (Signed)
Pt states he was taking 2 tablets a day on his metformin. rx that was sent in was for one time a day. Pls advise if ok to resend for twice a day.Marland KitchenJohny [name]

## 2018-08-08 ENCOUNTER — Other Ambulatory Visit: Payer: Self-pay | Admitting: Internal Medicine

## 2018-08-08 MED ORDER — METFORMIN HCL 500 MG PO TABS: 500.0000 mg | ORAL_TABLET | Freq: Every day | ORAL | 3 refills | Status: DC

## 2018-08-08 MED ORDER — METFORMIN HCL 500 MG PO TABS: 500.0000 mg | ORAL_TABLET | Freq: Two times a day (BID) | ORAL | 3 refills | Status: DC

## 2018-08-25 ENCOUNTER — Other Ambulatory Visit (INDEPENDENT_AMBULATORY_CARE_PROVIDER_SITE_OTHER): Payer: BLUE CROSS/BLUE SHIELD

## 2018-08-25 DIAGNOSIS — E118 Type 2 diabetes mellitus with unspecified complications: Secondary | ICD-10-CM

## 2018-08-25 LAB — BASIC METABOLIC PANEL
BUN: 15 mg/dL (ref 6–23)
CO2: 25 mEq/L (ref 19–32)
Calcium: 9.2 mg/dL (ref 8.4–10.5)
Chloride: 107 mEq/L (ref 96–112)
Creatinine, Ser: 0.83 mg/dL (ref 0.40–1.50)
GFR: 99.11 mL/min (ref >60.00)
Glucose, Bld: 195 mg/dL — ABNORMAL HIGH (ref 70–99)
Potassium: 3.5 mEq/L (ref 3.5–5.1)
Sodium: 143 mEq/L (ref 135–145)

## 2018-08-25 LAB — HEMOGLOBIN A1C: Hgb A1c MFr Bld: 7.3 % — ABNORMAL HIGH (ref 4.6–6.5)

## 2018-08-27 ENCOUNTER — Ambulatory Visit: Payer: BLUE CROSS/BLUE SHIELD | Admitting: Internal Medicine

## 2018-08-27 ENCOUNTER — Encounter: Payer: Self-pay | Admitting: Internal Medicine

## 2018-08-27 ENCOUNTER — Encounter: Payer: Self-pay | Admitting: Podiatry

## 2018-08-27 DIAGNOSIS — H6121 Impacted cerumen, right ear: Secondary | ICD-10-CM | POA: Diagnosis not present

## 2018-08-27 DIAGNOSIS — M159 Polyosteoarthritis, unspecified: Secondary | ICD-10-CM

## 2018-08-27 DIAGNOSIS — M8949 Other hypertrophic osteoarthropathy, multiple sites: Secondary | ICD-10-CM

## 2018-08-27 DIAGNOSIS — H6120 Impacted cerumen, unspecified ear: Secondary | ICD-10-CM | POA: Insufficient documentation

## 2018-08-27 DIAGNOSIS — I10 Essential (primary) hypertension: Secondary | ICD-10-CM

## 2018-08-27 DIAGNOSIS — M150 Primary generalized (osteo)arthritis: Secondary | ICD-10-CM

## 2018-08-27 DIAGNOSIS — E118 Type 2 diabetes mellitus with unspecified complications: Secondary | ICD-10-CM | POA: Diagnosis not present

## 2018-08-27 MED ORDER — CANAGLIFLOZIN-METFORMIN HCL ER 50-1000 MG PO TB24: 2.0000 | ORAL_TABLET | Freq: Every day | ORAL | 11 refills | Status: DC

## 2018-08-27 NOTE — Progress Notes (Signed)
Subjective:  Patient ID: [name] [name], male    DOB: [birth year]  Age: 62 y.o. MRN: [id number]  CC: No chief complaint on file.   HPI DERVIN VORE presents for HTN, DM, OA f/u  Outpatient Medications Prior to Visit  Medication Sig Dispense Refill  . amLODipine (NORVASC) 5 MG tablet Take 1 tablet (5 mg total) by mouth daily. 90 tablet 3  . blood glucose meter kit and supplies KIT Use to test blood sugar once a day. DX: E11.9 1 each 0  . cholecalciferol (VITAMIN D) 1000 UNITS tablet Take 1,000 Units by mouth daily.    Marland Kitchen glucose blood test strip Use to check blood sugar once per day. 100 each 12  . Lancets MISC Use to check blood sugar once per day. 100 each 3  . losartan (COZAAR) 100 MG tablet Take 1 tablet (100 mg total) by mouth daily. 90 tablet 3  . metFORMIN (GLUCOPHAGE) 500 MG tablet Take 1 tablet (500 mg total) by mouth 2 (two) times daily with a meal. 180 tablet 3  . potassium chloride SA (K-DUR,KLOR-CON) 20 MEQ tablet Take 1 tablet (20 mEq total) by mouth daily. 90 tablet 3  . repaglinide (PRANDIN) 2 MG tablet Take 1 tablet (2 mg total) by mouth 3 (three) times daily before meals. 270 tablet 3  . triamcinolone ointment (KENALOG) 0.1 % Apply 1 application topically 2 (two) times daily. 30 g 2   No facility-administered medications prior to visit.     ROS: Review of Systems  Constitutional: Positive for fatigue. Negative for appetite change and unexpected weight change.  HENT: Negative for congestion, nosebleeds, sneezing, sore throat and trouble swallowing.   Eyes: Negative for itching and visual disturbance.  Respiratory: Negative for cough.   Cardiovascular: Negative for chest pain, palpitations and leg swelling.  Gastrointestinal: Negative for abdominal distention, blood in stool, diarrhea and nausea.  Genitourinary: Negative for frequency and hematuria.  Musculoskeletal: Positive for arthralgias and back pain. Negative for gait problem, joint swelling and neck  pain.  Skin: Negative for rash.  Neurological: Negative for dizziness, tremors, speech difficulty and weakness.  Psychiatric/Behavioral: Negative for agitation, dysphoric mood and sleep disturbance. The patient is nervous/anxious.     Objective:  BP (!) 152/78 (BP Location: Left Arm, Patient Position: Sitting, Cuff Size: Large)   Pulse 64   Temp 97.8 F (36.6 C) (Oral)   Ht _0  (1.676 m)   Wt 195 lb (88.5 kg)   SpO2 98%   BMI 31.47 kg/m   BP Readings from Last 3 Encounters:  08/27/18 (!) 152/78  07/08/18 (!) 169/89  04/09/18 126/74    Wt Readings from Last 3 Encounters:  08/27/18 195 lb (88.5 kg)  04/09/18 196 lb (88.9 kg)  01/07/18 198 lb (89.8 kg)    Physical Exam  Constitutional: He is oriented to person, place, and time. He appears well-developed. No distress.  NAD  HENT:  Mouth/Throat: Oropharynx is clear and moist.  Eyes: Pupils are equal, round, and reactive to light. Conjunctivae are normal.  Neck: Normal range of motion. No JVD present. No thyromegaly present.  Cardiovascular: Normal rate, regular rhythm, normal heart sounds and intact distal pulses. Exam reveals no gallop and no friction rub.  No murmur heard. Pulmonary/Chest: Effort normal and breath sounds normal. No respiratory distress. He has no wheezes. He has no rales. He exhibits no tenderness.  Abdominal: Soft. Bowel sounds are normal. He exhibits no distension and no mass. There is no tenderness. There  is no rebound and no guarding.  Musculoskeletal: Normal range of motion. He exhibits no edema or tenderness.  Lymphadenopathy:    He has no cervical adenopathy.  Neurological: He is alert and oriented to person, place, and time. He has normal reflexes. No cranial nerve deficit. He exhibits normal muscle tone. He displays a negative Romberg sign. Coordination and gait normal.  Skin: Skin is warm and dry. No rash noted.  Psychiatric: He has a normal mood and affect. His behavior is normal. Judgment and  thought content normal.  wax R    Procedure Note :     Procedure :  Ear irrigation   Indication:  Cerumen impaction R   Risks, including pain, dizziness, eardrum perforation, bleeding, infection and others as well as benefits were explained to the patient in detail. Verbal consent was obtained and the patient agreed to proceed.    We used "The Elephant Ear Irrigation Device" filled with lukewarm water for irrigation. A large amount wax was recovered. Procedure has also required manual wax removal with an ear wax curette and ear forceps.   Tolerated well. Complications: None.   Postprocedure instructions :  Call if problems.    Lab Results  Component Value Date   WBC 7.5 12/11/2017   HGB 16.8 12/11/2017   HCT 48.4 12/11/2017   PLT 163.0 12/11/2017   GLUCOSE 195 (H) 08/25/2018   CHOL 123 12/11/2017   TRIG 56.0 12/11/2017   HDL 32.50 (L) 12/11/2017   LDLCALC 79 12/11/2017   ALT 32 12/11/2017   AST 17 12/11/2017   NA 143 08/25/2018   K 3.5 08/25/2018   CL 107 08/25/2018   CREATININE 0.83 08/25/2018   BUN 15 08/25/2018   CO2 25 08/25/2018   TSH 0.94 06/21/2016   PSA 2.92 06/21/2016   INR 1.1 (H) 01/08/2012   HGBA1C 7.3 (H) 08/25/2018    [name]  Result Date: 10/29/2016 CLINICAL DATA:  Chronic bilateral low back pain with left-sided sciatica. History of renal cancer EXAM: MRI LUMBAR SPINE WITHOUT CONTRAST TECHNIQUE: Multiplanar, multisequence MR imaging of the lumbar spine was performed. No intravenous contrast was administered. COMPARISON:  Lumbar spine radiographs 09/24/2016 FINDINGS: Segmentation:  Normal Alignment:  Normal Vertebrae: Negative for fracture or mass. Negative for metastatic disease. Anterior spurring and adjacent bone marrow edema on the left at L3-4 appears degenerative. Conus medullaris: Extends to the L1-2 level and appears normal. Paraspinal and other soft tissues: Paraspinous muscles normal. No retroperitoneal mass or adenopathy.  Disc levels: L1-2:  Negative L2-3:  Mild disc bulging without significant stenosis. L3-4: Small disc protrusion in the left foramen and lateral to the foramen with displacement of the left L3 nerve root lateral to the foramen. Mild left foraminal narrowing. Bilateral facet hypertrophy with mild spinal stenosis. Subarticular stenosis on the left. Anterior osteophyte on the left with adjacent bone marrow edema. L4-5: Mild disc bulging and mild facet degeneration. No significant stenosis L5-S1:  Negative IMPRESSION: Small disc protrusion in the left foramen extraforaminal space at L3-4. Mild subarticular and left foraminal narrowing. Mild spinal stenosis. Mild disc and facet degeneration L4-5 without stenosis. Electronically Signed   By: [name] M.D.   On: 10/29/2016 15:29    Assessment & Plan:   There are no diagnoses linked to this encounter.   No orders of the defined types were placed in this encounter.    Follow-up: No follow-ups on file.  Walker Kehr, MD

## 2018-08-27 NOTE — Assessment & Plan Note (Signed)
Worse Metformin - d/c Prandin d/c Start Invokamet Rx

## 2018-08-27 NOTE — Assessment & Plan Note (Addendum)
Invokamet Rx added

## 2018-08-27 NOTE — Assessment & Plan Note (Signed)
See procedure 

## 2018-09-01 NOTE — Assessment & Plan Note (Signed)
Tylenol prn 

## 2018-10-26 ENCOUNTER — Encounter: Payer: Self-pay | Admitting: Podiatry

## 2018-10-28 ENCOUNTER — Telehealth: Payer: Self-pay | Admitting: *Deleted

## 2018-10-28 ENCOUNTER — Encounter: Payer: Self-pay | Admitting: Podiatry

## 2018-10-28 MED ORDER — NONFORMULARY OR COMPOUNDED ITEM: 5 refills | Status: DC

## 2018-10-28 NOTE — Telephone Encounter (Signed)
DR. Jacqualyn Posey ordered the [facility] Antifungal cream. Faxed orders to [employer] [facility].

## 2018-12-02 ENCOUNTER — Ambulatory Visit: Payer: BLUE CROSS/BLUE SHIELD | Admitting: Internal Medicine

## 2019-01-29 NOTE — Telephone Encounter (Signed)
Spoke with patient, appointment has been made into virtual visit.

## 2019-01-29 NOTE — Telephone Encounter (Signed)
Copied from [place] [id number]. Topic: Quick Communication - See Telephone Encounter >> Jan 29, 2019  9:28 AM Vernona Rieger wrote: CRM for notification. See Telephone encounter for: 01/29/19. Pt would like to know could he go ahead and do his labs tomorrow morning since he has an appt with Dr Alain Marion on Monday. He also said he would like to do that appt Virtual and would like to have someone call him to set that up. He said to try and call him first on [phone] and if you do not get him then try mychart.

## 2019-01-30 ENCOUNTER — Other Ambulatory Visit (INDEPENDENT_AMBULATORY_CARE_PROVIDER_SITE_OTHER): Payer: Self-pay

## 2019-01-30 DIAGNOSIS — E118 Type 2 diabetes mellitus with unspecified complications: Secondary | ICD-10-CM

## 2019-01-30 LAB — BASIC METABOLIC PANEL
BUN: 20 mg/dL (ref 6–23)
CO2: 28 mEq/L (ref 19–32)
Calcium: 9.7 mg/dL (ref 8.4–10.5)
Chloride: 106 mEq/L (ref 96–112)
Creatinine, Ser: 0.79 mg/dL (ref 0.40–1.50)
GFR: 98.58 mL/min (ref >60.00)
Glucose, Bld: 137 mg/dL — ABNORMAL HIGH (ref 70–99)
Potassium: 3.5 mEq/L (ref 3.5–5.1)
Sodium: 143 mEq/L (ref 135–145)

## 2019-01-30 LAB — HEMOGLOBIN A1C: Hgb A1c MFr Bld: 7.1 % — ABNORMAL HIGH (ref 4.6–6.5)

## 2019-02-02 ENCOUNTER — Encounter: Payer: Self-pay | Admitting: Internal Medicine

## 2019-02-02 ENCOUNTER — Ambulatory Visit (INDEPENDENT_AMBULATORY_CARE_PROVIDER_SITE_OTHER): Payer: BLUE CROSS/BLUE SHIELD | Admitting: Internal Medicine

## 2019-02-02 ENCOUNTER — Ambulatory Visit: Payer: Self-pay | Admitting: Internal Medicine

## 2019-02-02 ENCOUNTER — Ambulatory Visit: Payer: Self-pay | Admitting: *Deleted

## 2019-02-02 DIAGNOSIS — Z20828 Contact with and (suspected) exposure to other viral communicable diseases: Secondary | ICD-10-CM | POA: Diagnosis not present

## 2019-02-02 DIAGNOSIS — I10 Essential (primary) hypertension: Secondary | ICD-10-CM

## 2019-02-02 DIAGNOSIS — N2889 Other specified disorders of kidney and ureter: Secondary | ICD-10-CM | POA: Diagnosis not present

## 2019-02-02 DIAGNOSIS — E118 Type 2 diabetes mellitus with unspecified complications: Secondary | ICD-10-CM | POA: Diagnosis not present

## 2019-02-02 DIAGNOSIS — Z20822 Contact with and (suspected) exposure to covid-19: Secondary | ICD-10-CM

## 2019-02-02 NOTE — Assessment & Plan Note (Signed)
Continue with Invokanamet, Norvasc, Benicar HCT

## 2019-02-02 NOTE — Telephone Encounter (Signed)
Please send his work note to his email [email or web address] Answer Assessment - Initial Assessment Questions 1. REASON FOR CALL or QUESTION: "What is your reason for calling today?" or "How can I best help you?" or "What question do you have that I can help answer?"    He would like the work note by Dr. Mamie Nick to be sent to his email instead of MyChart.  2. CALLER: Document the source of call. (e.g., laboratory, patient).     Patient.  Protocols used: PCP CALL - NO TRIAGE-A-AH

## 2019-02-02 NOTE — Telephone Encounter (Signed)
Mr. Olivero called to request the work note that Dr. Alain Marion placed in his 59, be emailed to him instead.  email [email or web address]. Routing to PCP as requested. Thank you.

## 2019-02-02 NOTE — Assessment & Plan Note (Signed)
Continue with Invokamet [name] [name]'s last hemoglobin A1c was 7.1%-better than before

## 2019-02-02 NOTE — Progress Notes (Signed)
Virtual Visit via Telephone Note  I connected with [name] [name] on 02/02/19 at  3:40 PM EDT by telephone and verified that I am speaking with the correct person using two identifiers.   I discussed the limitations, risks, security and privacy concerns of performing an evaluation and management service by telephone and the availability of in person appointments. I also discussed with the patient that there may be a patient responsible charge related to this service. The patient expressed understanding and agreed to proceed.   History of Present Illness:   [name] [name] is to follow-up on his hypertension, diabetes, renal cancer. [name] [name]'s daughter works for [employer].  They have 2 daughters tested positive for COVID-19.  [name] [name] was in direct contact with his daughter.  He was quarantined for 2 weeks.  He is still scared to go back to work.  He lost 15 pounds at home, allegedly on diet. He has no respiratory symptoms.  Observations/Objective:  [name] [name] is in no acute distress Assessment and Plan:  See plan Follow Up Instructions:    I discussed the assessment and treatment plan with the patient. The patient was provided an opportunity to ask questions and all were answered. The patient agreed with the plan and demonstrated an understanding of the instructions.   The patient was advised to call back or seek an in-person evaluation if the symptoms worsen or if the condition fails to improve as anticipated.  I provided  20 minutes of non-face-to-face time during this encounter.   Walker Kehr, MD

## 2019-02-02 NOTE — Assessment & Plan Note (Signed)
S/p partial R nephrectomy - renal cell carcinoma

## 2019-02-02 NOTE — Assessment & Plan Note (Addendum)
Markale's daughter works for [employer].  They have 2 daughters tested positive for COVID-19.  Devereaux was in direct contact with his daughter.  He was quarantined for 2 weeks.  Burnett is at high risk for [id number] complications.  We will keep him out of work for another week  I will fill out his FMLA and a short-term disability form when he submits them to me

## 2019-02-02 NOTE — Telephone Encounter (Signed)
I have a 3:40 appt with Plotnikov as a virtual visit.   I have a new e mail address.   I also don't know how to open up the visit when he calls.  I have changed your e mail address to the correct one.    I will let the office know and have someone call you to verify they have the right one so everything will be set up by the time Dr. Alain Marion calls you.  I spoke with Gareth Eagle and gave her the correct e mail address [email or web address]  She is going to send him a new text link.    I called him back and let him know how it works via the text.  To watch for the new text link they are going to send to you.  It won't work until about 5 minutes before the appt time. He verbalized understanding of these instructions.   I let him know to call us back if he had further questions or problems connecting.  He ver

## 2019-02-04 ENCOUNTER — Other Ambulatory Visit: Payer: Self-pay | Admitting: Internal Medicine

## 2019-02-04 MED ORDER — DOXYCYCLINE HYCLATE 100 MG PO TABS: 100.0000 mg | ORAL_TABLET | Freq: Two times a day (BID) | ORAL | 0 refills | Status: DC

## 2019-02-04 NOTE — Telephone Encounter (Signed)
Letter emailed

## 2019-02-26 ENCOUNTER — Other Ambulatory Visit: Payer: Self-pay | Admitting: Internal Medicine

## 2019-03-30 ENCOUNTER — Telehealth: Payer: Self-pay | Admitting: Internal Medicine

## 2019-03-30 NOTE — Telephone Encounter (Signed)
Pt requesting covid testing, possible exposure. Pt is asymptomatic. Friends daughter tested positive, resulted today; he was at their home yesterday.  Please advise: [phone]

## 2019-03-31 ENCOUNTER — Ambulatory Visit: Payer: Self-pay | Admitting: *Deleted

## 2019-03-31 ENCOUNTER — Telehealth: Payer: Self-pay

## 2019-03-31 ENCOUNTER — Other Ambulatory Visit: Payer: BLUE CROSS/BLUE SHIELD

## 2019-03-31 DIAGNOSIS — Z20822 Contact with and (suspected) exposure to covid-19: Secondary | ICD-10-CM

## 2019-03-31 DIAGNOSIS — R6889 Other general symptoms and signs: Secondary | ICD-10-CM | POA: Diagnosis not present

## 2019-03-31 NOTE — Telephone Encounter (Signed)
Message sent to testing center for covid testing

## 2019-03-31 NOTE — Telephone Encounter (Signed)
Dr. Alain Marion requests COVID 19 TEST for exposure. Pt. Scheduled for today.

## 2019-03-31 NOTE — Telephone Encounter (Signed)
See nurse triage noted dated 03/31/2019.

## 2019-03-31 NOTE — Telephone Encounter (Signed)
Pt called stating that he was exposed to a friend who tested positive for COVID; his last encounter with the person was on 03/29/2019; the pt is denies symptoms; he is concerned because he has a history of smoking, HTN, and diabetes; nurse triage initiated and recommendations made per protocol; he verbalized understanding; the pt sees Dr Alain Marion, and can be contacted at [phone]; also see telephone encounter dated 03/30/2019.   Reason for Disposition . [1] COVID-19 EXPOSURE (Close Contact) AND [2] within last 14 days BUT [3] NO symptoms  Answer Assessment - Initial Assessment Questions 1. CLOSE CONTACT: "Who is the person with the confirmed or suspected COVID-19 infection that you were exposed to?"     friend 2. PLACE of CONTACT: "Where were you when you were exposed to COVID-19?" (e.g., home, school, medical waiting room; which city?) friend's home [place], [place] 3. TYPE of CONTACT: "How much contact was there?" (e.g., sitting next to, live in same house, work in same office, same building)     Contact at home and church 4. DURATION of CONTACT: "How long were you in contact with the COVID-19 patient?" (e.g., a few seconds, passed by person, a few minutes, live with the patient)     1 hour 5. DATE of CONTACT: "When did you have contact with a COVID-19 patient?" (e.g., how many days ago)     03/29/2019 6. TRAVEL: "Have you traveled out of the country recently?" If so, "When and where?"     * Also ask about out-of-state travel, since the CDC has identified some high-risk cities for community spread in the [place].     * Note: Travel becomes less relevant if there is widespread community transmission where the patient lives.     no 7. COMMUNITY SPREAD: "Are there lots of cases of COVID-19 (community spread) where you live?" (See public health department website, if unsure)       Major community spread 8. SYMPTOMS: "Do you have any symptoms?" (e.g., fever, cough, breathing difficulty)     no 9.  PREGNANCY OR POSTPARTUM: "Is there any chance you are pregnant?" "When was your last menstrual period?" "Did you deliver in the last 2 weeks?"     n/a 10. HIGH RISK: "Do you have any heart or lung problems? Do you have a weak immune system?" (e.g., CHF, COPD, asthma, HIV positive, chemotherapy, renal failure, diabetes mellitus, sickle cell anemia)     Diabetes, hypertension, smoker  Protocols used: CORONAVIRUS (COVID-19) EXPOSURE-A-AH

## 2019-04-02 LAB — NOVEL CORONAVIRUS, NAA: SARS-CoV-2, NAA: NOT DETECTED

## 2019-04-03 ENCOUNTER — Ambulatory Visit: Payer: Self-pay | Admitting: Internal Medicine

## 2019-04-03 ENCOUNTER — Telehealth: Payer: Self-pay | Admitting: Internal Medicine

## 2019-04-03 NOTE — Telephone Encounter (Signed)
Patient states he received his COVID testing through My Chart but the results did not have his name on them.  Patient states that his employer is requesting the results to have his name to submit to the CDC.  Can we please send a letter to patient through mychart with those results?

## 2019-04-03 NOTE — Telephone Encounter (Signed)
Pt called back stating that he needs a copy of his COVID results emailed to him because the my char results do not have his name on it; the pt says that he needs this information emailed to him because his employer is requiring it; pt transferred to [place] at [facility].

## 2019-04-03 NOTE — Telephone Encounter (Signed)
Patient advised that covid19 test results letter has been created and sent to his mychart account---patient wants to print from there---pt advised that doctor's name is typed on this letter, but no way to have doctor signature----if his work requires more information, call us back on monday

## 2019-04-03 NOTE — Telephone Encounter (Signed)
I got my test results back yesterday for the COVID-19.  My employer is requesting a copy of the results with my name on it.   "They are acting weird".   My name should be on there. I let him know his name should already be on the results.   I don't know if he hung up or we got disconnected but the line went dead.

## 2019-04-16 ENCOUNTER — Other Ambulatory Visit: Payer: Self-pay | Admitting: *Deleted

## 2019-04-16 MED ORDER — LOSARTAN POTASSIUM 100 MG PO TABS: 100.0000 mg | ORAL_TABLET | Freq: Every day | ORAL | 3 refills | Status: DC

## 2019-06-16 ENCOUNTER — Ambulatory Visit (INDEPENDENT_AMBULATORY_CARE_PROVIDER_SITE_OTHER): Payer: BC Managed Care – PPO | Admitting: Internal Medicine

## 2019-06-16 ENCOUNTER — Encounter: Payer: Self-pay | Admitting: Internal Medicine

## 2019-06-16 DIAGNOSIS — B349 Viral infection, unspecified: Secondary | ICD-10-CM

## 2019-06-16 NOTE — Progress Notes (Signed)
Virtual Visit via Video Note  I connected with [name] [name] on 06/16/19 at  3:15 PM EDT by a video enabled telemedicine application and verified that I am speaking with the correct person using two identifiers.   I discussed the limitations of evaluation and management by telemedicine and the availability of in person appointments. The patient expressed understanding and agreed to proceed.  The patient is currently at home and I am in the office.    No referring provider.    History of Present Illness: This is an acute visit for cold symptoms.  2 nights ago he had a hot/cold feeling and felt feverish all night.  When he woke up yesterday he felt sick.  He had body aches and headache.  He took Tylenol, but continued to not feel well all day.  This morning he again woke up and felt feverish, fatigued, has had headaches and had a temperature of 99.7.  He has not had a sore throat, diarrhea or other cold symptoms.  He does feel slightly better today than he did yesterday.  He does have a possible COVID exposure-he has been around his grandson and 2 of his grandsons friends tested positive for COVID.  He was tested himself approximately a month ago and it was negative.   Review of Systems  Constitutional: Positive for chills, diaphoresis and fever (99.7).  HENT: Negative for sore throat.   Respiratory: Positive for cough (chronic).   Gastrointestinal: Positive for nausea. Negative for diarrhea.  Musculoskeletal: Positive for myalgias.  Neurological: Positive for headaches.      Social History   Socioeconomic History  . Marital status: Married    Spouse name: Not on file  . Number of children: Not on file  . Years of education: Not on file  . Highest education level: Not on file  Occupational History  . Not on file  Social Needs  . Financial resource strain: Not on file  . Food insecurity    Worry: Not on file    Inability: Not on file  . Transportation needs    Medical:  Not on file    Non-medical: Not on file  Tobacco Use  . Smoking status: Former Smoker    Packs/day: 0.50    Years: 30.00    Pack years: 15.00    Types: Cigarettes  . Smokeless tobacco: Never Used  Substance and Sexual Activity  . Alcohol use: No    Comment: less than 1 beer a month  . Drug use: Yes    Frequency: 4.0 times per week    Types: Marijuana    Comment: marijuana-smokes daily  . Sexual activity: Yes  Lifestyle  . Physical activity    Days per week: Not on file    Minutes per session: Not on file  . Stress: Not on file  Relationships  . Social Herbalist on phone: Not on file    Gets together: Not on file    Attends religious service: Not on file    Active member of club or organization: Not on file    Attends meetings of clubs or organizations: Not on file    Relationship status: Not on file  Other Topics Concern  . Not on file  Social History Narrative   Occupation: Personal assistant   Current smoker   Regular exercise- no   Married           Observations/Objective: Appears well in NAD Breathing normally  Blood pressure this  morning 133/75, heart rate 69 Temperature this morning 99.7  Assessment and Plan:  See Problem List for Assessment and Plan of chronic medical problems.   Follow Up Instructions:    I discussed the assessment and treatment plan with the patient. The patient was provided an opportunity to ask questions and all were answered. The patient agreed with the plan and demonstrated an understanding of the instructions.   The patient was advised to call back or seek an in-person evaluation if the symptoms worsen or if the condition fails to improve as anticipated.    Binnie Rail, MD

## 2019-06-16 NOTE — Assessment & Plan Note (Signed)
Symptoms consistent with a viral illness, possibly COVID, especially given his exposure to his grandson who has been playing with 2 friends that tested positive recently Discussed that he should get tested-he agrees.  Test ordered and he will go tomorrow Advised him to stay at home and self quarantine until results come back.  If negative can return to work Symptomatic treatment at this time, continue Tylenol, rest, fluids  Call with any questions or concerns

## 2019-06-17 ENCOUNTER — Other Ambulatory Visit: Payer: Self-pay

## 2019-06-17 DIAGNOSIS — R6889 Other general symptoms and signs: Secondary | ICD-10-CM | POA: Diagnosis not present

## 2019-06-17 DIAGNOSIS — Z20822 Contact with and (suspected) exposure to covid-19: Secondary | ICD-10-CM

## 2019-06-19 ENCOUNTER — Encounter: Payer: Self-pay | Admitting: Internal Medicine

## 2019-06-19 LAB — NOVEL CORONAVIRUS, NAA: SARS-CoV-2, NAA: NOT DETECTED

## 2019-06-22 ENCOUNTER — Encounter: Payer: Self-pay | Admitting: Internal Medicine

## 2019-07-09 ENCOUNTER — Encounter: Payer: Self-pay | Admitting: Internal Medicine

## 2019-07-09 ENCOUNTER — Telehealth: Payer: Self-pay | Admitting: Internal Medicine

## 2019-07-09 NOTE — Telephone Encounter (Signed)
Per chart pt is a difficult intubation and has to have procedures at the hospital. Dr. Blanch Media next hospital date is 09/07/19. Left message for pt to call back.

## 2019-07-21 ENCOUNTER — Telehealth: Payer: Self-pay | Admitting: *Deleted

## 2019-07-21 ENCOUNTER — Other Ambulatory Visit: Payer: Self-pay

## 2019-07-21 ENCOUNTER — Ambulatory Visit (AMBULATORY_SURGERY_CENTER): Payer: Self-pay | Admitting: *Deleted

## 2019-07-21 VITALS — Temp 96.5°F | Ht 66.0 in | Wt 187.4 lb

## 2019-07-21 DIAGNOSIS — Z86010 Personal history of colonic polyps: Secondary | ICD-10-CM

## 2019-07-21 MED ORDER — NA SULFATE-K SULFATE-MG SULF 17.5-3.13-1.6 GM/177ML PO SOLN: 1.0000 | Freq: Once | ORAL | 0 refills | Status: AC

## 2019-07-21 NOTE — Telephone Encounter (Signed)
Call placed to pt.to verify if he wants to come in for pre-visit without appointment scheduled for hospital,pt. Will still come in for pre-visit without time and dates on instructions.

## 2019-07-21 NOTE — Progress Notes (Signed)
No egg or soy allergy known to patient  No issues with past sedation with any surgeries  or procedures,   No diet pills per patient No home 02 use per patient  No blood thinners per patient  Pt denies issues with constipation  No A fib or A flutter  EMMI video sent to pt's e mail   Due to the COVID-19 pandemic we are asking patients to follow these guidelines. Please only bring one care partner. Please be aware that your care partner may wait in the car in the parking lot or if they feel like they will be too hot to wait in the car, they may wait in the lobby on the 4th floor. All care partners are required to wear a mask the entire time (we do not have any that we can provide them), they need to practice social distancing, and we will do a Covid check for all patient's and care partners when you arrive. Also we will check their temperature and your temperature. If the care partner waits in their car they need to stay in the parking lot the entire time and we will call them on their cell phone when the patient is ready for discharge so they can bring the car to the front of the building. Also all patient's will need to wear a mask into building.  Pt. Has h/o difficult intubation,procedure was cancelled for lec, went over blank instruction and informed pt. That someone from the office will schedule procedure at hospital and would contact him about the rest of instructions,encourage pt. If he has not heard from the office to please call back,he verbalize understanding.

## 2019-07-24 ENCOUNTER — Other Ambulatory Visit: Payer: Self-pay

## 2019-07-24 DIAGNOSIS — Z1211 Encounter for screening for malignant neoplasm of colon: Secondary | ICD-10-CM

## 2019-07-24 NOTE — Telephone Encounter (Signed)
Pt scheduled for covid testing 11/19@11am , Colon scheduled at [facility] 09/07/19@10 :30am. Pt to arrive there at 9am. Pt had previsit and has instructions. Pt aware of appts.

## 2019-07-28 ENCOUNTER — Encounter: Payer: Self-pay | Admitting: Internal Medicine

## 2019-08-05 ENCOUNTER — Encounter: Payer: BC Managed Care – PPO | Admitting: Internal Medicine

## 2019-08-13 ENCOUNTER — Telehealth: Payer: Self-pay | Admitting: Internal Medicine

## 2019-08-13 NOTE — Telephone Encounter (Signed)
Appt cancelled and rescheduled at [facility] for 09/29/19@9 :30am. Pt to arrive there at 8am. Left message for pt to call back.

## 2019-08-14 ENCOUNTER — Telehealth: Payer: Self-pay | Admitting: Internal Medicine

## 2019-08-14 NOTE — Telephone Encounter (Signed)
Patient is asking for a Coupon  for the invokana. The drug store wants to charge for $200. When the patient first got a voucher her did not pay anything. [facility] sent the patient a message that there was no coupon available at this time.  Cb- [phone]

## 2019-08-14 NOTE — Telephone Encounter (Signed)
Pt aware of new appt date. Covid test rescheduled for 09/29/19@9 :30am. Pt aware of both appointments.

## 2019-08-17 NOTE — Telephone Encounter (Signed)
LM notifying pt that we did not have a coupon but I did find a savings program and printed info for patient to pick up.

## 2019-08-17 NOTE — Telephone Encounter (Signed)
noted 

## 2019-08-17 NOTE — Telephone Encounter (Signed)
Patient called to let Inez Catalina know that the problem with his medication and savings has all been taken care of.  He wanted to thank everyone for their help.

## 2019-09-03 ENCOUNTER — Other Ambulatory Visit (HOSPITAL_COMMUNITY): Payer: BC Managed Care – PPO

## 2019-09-25 ENCOUNTER — Other Ambulatory Visit (HOSPITAL_COMMUNITY)
Admission: RE | Admit: 2019-09-25 | Discharge: 2019-09-25 | Disposition: A | Discharge status: Home / Self Care | Payer: BC Managed Care – PPO | Source: Ambulatory Visit | Attending: Internal Medicine | Admitting: Internal Medicine

## 2019-09-25 DIAGNOSIS — Z20828 Contact with and (suspected) exposure to other viral communicable diseases: Secondary | ICD-10-CM | POA: Insufficient documentation

## 2019-09-25 DIAGNOSIS — Z01812 Encounter for preprocedural laboratory examination: Secondary | ICD-10-CM | POA: Diagnosis not present

## 2019-09-26 LAB — NOVEL CORONAVIRUS, NAA (HOSP ORDER, SEND-OUT TO REF LAB; TAT 18-24 HRS): SARS-CoV-2, NAA: NOT DETECTED

## 2019-09-29 ENCOUNTER — Ambulatory Visit (HOSPITAL_COMMUNITY): Payer: BC Managed Care – PPO | Admitting: Certified Registered Nurse Anesthetist

## 2019-09-29 ENCOUNTER — Ambulatory Visit (HOSPITAL_COMMUNITY)
Admission: RE | Admit: 2019-09-29 | Discharge: 2019-09-29 | Disposition: A | Discharge status: Home / Self Care | Payer: BC Managed Care – PPO | Attending: Internal Medicine | Admitting: Internal Medicine

## 2019-09-29 ENCOUNTER — Other Ambulatory Visit: Payer: Self-pay

## 2019-09-29 ENCOUNTER — Encounter (HOSPITAL_COMMUNITY): Payer: Self-pay | Admitting: Internal Medicine

## 2019-09-29 ENCOUNTER — Encounter (HOSPITAL_COMMUNITY)
Admission: RE | Disposition: A | Discharge status: Home / Self Care | Payer: Self-pay | Source: Home / Self Care | Attending: Internal Medicine

## 2019-09-29 DIAGNOSIS — K5730 Diverticulosis of large intestine without perforation or abscess without bleeding: Secondary | ICD-10-CM | POA: Insufficient documentation

## 2019-09-29 DIAGNOSIS — Z860100 Personal history of colon polyps, unspecified: Secondary | ICD-10-CM

## 2019-09-29 DIAGNOSIS — E119 Type 2 diabetes mellitus without complications: Secondary | ICD-10-CM | POA: Diagnosis not present

## 2019-09-29 DIAGNOSIS — K648 Other hemorrhoids: Secondary | ICD-10-CM | POA: Diagnosis not present

## 2019-09-29 DIAGNOSIS — E785 Hyperlipidemia, unspecified: Secondary | ICD-10-CM | POA: Insufficient documentation

## 2019-09-29 DIAGNOSIS — Z7984 Long term (current) use of oral hypoglycemic drugs: Secondary | ICD-10-CM | POA: Insufficient documentation

## 2019-09-29 DIAGNOSIS — Z87891 Personal history of nicotine dependence: Secondary | ICD-10-CM | POA: Insufficient documentation

## 2019-09-29 DIAGNOSIS — Z85528 Personal history of other malignant neoplasm of kidney: Secondary | ICD-10-CM | POA: Insufficient documentation

## 2019-09-29 DIAGNOSIS — D122 Benign neoplasm of ascending colon: Secondary | ICD-10-CM | POA: Diagnosis not present

## 2019-09-29 DIAGNOSIS — I10 Essential (primary) hypertension: Secondary | ICD-10-CM | POA: Diagnosis not present

## 2019-09-29 DIAGNOSIS — D123 Benign neoplasm of transverse colon: Secondary | ICD-10-CM

## 2019-09-29 DIAGNOSIS — Z1211 Encounter for screening for malignant neoplasm of colon: Secondary | ICD-10-CM | POA: Diagnosis not present

## 2019-09-29 HISTORY — PX: POLYPECTOMY: SHX5525

## 2019-09-29 HISTORY — PX: COLONOSCOPY WITH PROPOFOL: SHX5780

## 2019-09-29 LAB — GLUCOSE, CAPILLARY: Glucose-Capillary: 208 mg/dL — ABNORMAL HIGH (ref 70–99)

## 2019-09-29 SURGERY — COLONOSCOPY WITH PROPOFOL
Anesthesia: Monitor Anesthesia Care

## 2019-09-29 MED ORDER — LACTATED RINGERS IV SOLN
INTRAVENOUS | Status: DC
  Administered 2019-09-29: 1000 mL via INTRAVENOUS

## 2019-09-29 MED ORDER — PROPOFOL 500 MG/50ML IV EMUL
INTRAVENOUS | Status: AC
  Filled 2019-09-29: qty 50

## 2019-09-29 MED ORDER — PROPOFOL 10 MG/ML IV BOLUS
INTRAVENOUS | Status: DC | PRN
  Administered 2019-09-29 (×2): 20 mg via INTRAVENOUS

## 2019-09-29 MED ORDER — PROPOFOL 500 MG/50ML IV EMUL
INTRAVENOUS | Status: DC | PRN
  Administered 2019-09-29: 140 ug/kg/min via INTRAVENOUS

## 2019-09-29 MED ORDER — SODIUM CHLORIDE 0.9 % IV SOLN: INTRAVENOUS | Status: DC

## 2019-09-29 SURGICAL SUPPLY — 21 items

## 2019-09-29 NOTE — Discharge Instructions (Signed)
YOU HAD AN ENDOSCOPIC PROCEDURE TODAY: Refer to the procedure report and other information in the discharge instructions given to you for any specific questions about what was found during the examination. If this information does not answer your questions, please call [place] office at [phone] to clarify.   YOU SHOULD EXPECT: Some feelings of bloating in the abdomen. Passage of more gas than usual. Walking can help get rid of the air that was put into your GI tract during the procedure and reduce the bloating. If you had a lower endoscopy (such as a colonoscopy or flexible sigmoidoscopy) you may notice spotting of blood in your stool or on the toilet paper. Some abdominal soreness may be present for a day or two, also.  DIET: Your first meal following the procedure should be a light meal and then it is ok to progress to your normal diet. A half-sandwich or bowl of soup is an example of a good first meal. Heavy or fried foods are harder to digest and may make you feel nauseous or bloated. Drink plenty of fluids but you should avoid alcoholic beverages for 24 hours. If you had a esophageal dilation, please see attached instructions for diet.    ACTIVITY: Your care partner should take you home directly after the procedure. You should plan to take it easy, moving slowly for the rest of the day. You can resume normal activity the day after the procedure however YOU SHOULD NOT DRIVE, use power tools, machinery or perform tasks that involve climbing or major physical exertion for 24 hours (because of the sedation medicines used during the test).   SYMPTOMS TO REPORT IMMEDIATELY: A gastroenterologist can be reached at any hour. Please call [phone]  for any of the following symptoms:  Following lower endoscopy (colonoscopy, flexible sigmoidoscopy) Excessive amounts of blood in the stool  Significant tenderness, worsening of abdominal pains  Swelling of the abdomen that is new, acute  Fever of 100 or  higher  Following upper endoscopy (EGD, EUS, ERCP, esophageal dilation) Vomiting of blood or coffee ground material  New, significant abdominal pain  New, significant chest pain or pain under the shoulder blades  Painful or persistently difficult swallowing  New shortness of breath  Black, tarry-looking or red, bloody stools  FOLLOW UP:  If any biopsies were taken you will be contacted by phone or by letter within the next 1-3 weeks. Call [phone]  if you have not heard about the biopsies in 3 weeks.  Please also call with any specific questions about appointments or follow up tests.     Please excuse _________________ from work on ____________ due to them having an outpatient procedure. They can return to work on ____________.   Thanks,    Dr. Scarlette Shorts  ____________________________

## 2019-09-29 NOTE — H&P (Signed)
HISTORY OF PRESENT ILLNESS:  [name] [name] is a 63 y.o. male with past medical history as listed below who presents today regarding surveillance colonoscopy.  Patient has had previous colonoscopy in 2007, 2008, in 2014.  Found to have hyperplastic, sessile serrated polyps, and diminutive tubular adenoma.  No active GI complaints.  He tolerated his prep well.  His case is being done at the hospital with monitored anesthesia care due to a reported history of difficult intubation.  REVIEW OF SYSTEMS:  All non-GI ROS negative unless otherwise stated in the HPI except for arthritis  Past Medical History:  Diagnosis Date  . Cancer ([place]) 07/28/2012   kidney  . Diabetes mellitus type II   . Difficult intubation 07/28/2012   hard to place breathing tube  . Elevated glucose   . Elevated PSA   . GERD (gastroesophageal reflux disease)    pt.denies 07/21/19  . Herniated cervical disc    no current pain  . History of kidney stones   . Hyperlipidemia    pt. denies  . Hypertension   . Osteoarthritis   . Renal neoplasm    RT  . Substance abuse [facility])     Past Surgical History:  Procedure Laterality Date  . COLONOSCOPY N/A 03/03/2013   Procedure: COLONOSCOPY;  Surgeon: Irene Shipper, MD;  Location: [facility] ENDOSCOPY;  Service: Endoscopy;  Laterality: N/A;  . COLONOSCOPY    . KIDNEY SURGERY Right 07/28/12   Had 10% removed for cancer   . POLYPECTOMY    . TONSILLECTOMY  1965    Social History [name] [name]  reports that he has quit smoking. His smoking use included cigarettes. He has a 15.00 pack-year smoking history. He has never used smokeless tobacco. He reports current drug use. Frequency: 4.00 times per week. Drug: Marijuana. He reports that he does not drink alcohol.  family history includes Colon cancer in his paternal aunt; Colon cancer (age of onset: 69) in his mother; Lymphoma in his mother.  Allergies  Allergen Reactions  . Codeine Sulfate Itching  . Tamsulosin Other (See  Comments)    Unable to perform sexually       PHYSICAL EXAMINATION: Vital signs: BP (!) 161/87   Pulse 74   Temp 97.7 F (36.5 C) (Oral)   Resp 20   Ht 5\' 6"  (1.676 m)   Wt 81 kg   SpO2 98%   BMI 28.82 kg/m   Constitutional: generally well-appearing, no acute distress Psychiatric: alert and oriented x3, cooperative Eyes: extraocular movements intact, anicteric, conjunctiva pink Mouth: oral pharynx moist, no lesions Neck: supple no lymphadenopathy Cardiovascular: heart regular rate and rhythm, no murmur Lungs: clear to auscultation bilaterally Abdomen: soft, nontender, nondistended, no obvious ascites, no peritoneal signs, normal bowel sounds, no organomegaly Rectal: Deferred until colonoscopy Extremities: no lower extremity edema bilaterally Skin: no lesions on visible extremities Neuro: No focal deficits.  Cranial nerves intact  ASSESSMENT:  1.  History of sessile serrated polyps and tubular adenoma.  Due for surveillance colonoscopy 2.  Reported history of difficult intubation.  Being done at the hospital under monitored anesthesia care   PLAN:  1.  Colonoscopy with polypectomy if necessary.The nature of the procedure, as well as the risks, benefits, and alternatives were carefully and thoroughly reviewed with the patient. Ample time for discussion and questions allowed. The patient understood, was satisfied, and agreed to proceed.

## 2019-09-29 NOTE — Anesthesia Procedure Notes (Signed)
Procedure Name: MAC Date/Time: 09/29/2019 9:26 AM Performed by: Claudia Desanctis, CRNA Pre-anesthesia Checklist: Patient identified, Emergency Drugs available, Suction available and Patient being monitored Oxygen Delivery Method: Simple face mask

## 2019-09-29 NOTE — Op Note (Signed)
[facility] Patient Name: [name] [name] Procedure Date: 09/29/2019 MRN: [id number] Attending MD: Docia Chuck. Henrene Pastor , MD Date of Birth: [birth year] CSN: [id number] Age: 63 Admit Type: Outpatient Procedure:                Colonoscopy with cold snare polypectomy x 3 Indications:              High risk colon cancer surveillance: Personal                            history of non-advanced adenoma, High risk colon                            cancer surveillance: Personal history of sessile                            serrated colon polyp (10 mm or greater in size).                            Previous examinations 2007, 2008, 2014 Providers:                Docia Chuck. Henrene Pastor, MD, Carmie End, RN, Lazaro Arms, Technician Referring MD:              Medicines:                Monitored Anesthesia Care Complications:            No immediate complications. Estimated blood loss:                            None. Estimated Blood Loss:     Estimated blood loss: none. Procedure:                Pre-Anesthesia Assessment:                           - Prior to the procedure, a History and Physical                            was performed, and patient medications and                            allergies were reviewed. The patient's tolerance of                            previous anesthesia was also reviewed. The risks                            and benefits of the procedure and the sedation                            options and risks were discussed with the patient.  All questions were answered, and informed consent                            was obtained. Prior Anticoagulants: The patient has                            taken no previous anticoagulant or antiplatelet                            agents. ASA Grade Assessment: II - A patient with                            mild systemic disease. After reviewing the risks                             and benefits, the patient was deemed in                            satisfactory condition to undergo the procedure.                           After obtaining informed consent, the colonoscope                            was passed under direct vision. Throughout the                            procedure, the patient's blood pressure, pulse, and                            oxygen saturations were monitored continuously. The                            [id number]) Olympus colonoscope was                            introduced through the anus and advanced to the the                            cecum, identified by appendiceal orifice and                            ileocecal valve. The ileocecal valve, appendiceal                            orifice, and rectum were photographed. The quality                            of the bowel preparation was excellent. The                            colonoscopy was performed without difficulty. The  patient tolerated the procedure well. The bowel                            preparation used was SUPREP via split dose                            instruction. Scope In: 9:36:29 AM Scope Out: 9:51:06 AM Scope Withdrawal Time: 0 hours 12 minutes 16 seconds  Total Procedure Duration: 0 hours 14 minutes 37 seconds  Findings:      Three polyps were found in the transverse colon and ascending colon. The       polyps were 4 to 6 mm in size. These polyps were removed with a cold       snare. Resection and retrieval were complete.      Multiple diverticula were found in the entire colon.      Internal hemorrhoids were found during retroflexion.      The exam was otherwise without abnormality on direct and retroflexion       views. Impression:               - Three 4 to 6 mm polyps in the transverse colon                            and in the ascending colon, removed with a cold                            snare. Resected and retrieved.                            - Diverticulosis in the entire examined colon.                           - Internal hemorrhoids.                           - The examination was otherwise normal on direct                            and retroflexion views. Moderate Sedation:      none Recommendation:           - Repeat colonoscopy in 5 years for surveillance.                           - Patient has a contact number available for                            emergencies. The signs and symptoms of potential                            delayed complications were discussed with the                            patient. Return to normal activities tomorrow.  Written discharge instructions were provided to the                            patient.                           - Resume previous diet.                           - Continue present medications.                           - Await pathology results.                           - Okay to return to work tomorrow. Provide work note Procedure Code(s):        --- Professional ---                           [id number], Colonoscopy, flexible; with removal of                            tumor(s), polyp(s), or other lesion(s) by snare                            technique Diagnosis Code(s):        --- Professional ---                           K63.5, Polyp of colon                           K64.8, Other hemorrhoids                           Z86.010, Personal history of colonic polyps                           K57.30, Diverticulosis of large intestine without                            perforation or abscess without bleeding CPT copyright 2019 American Medical Association. All rights reserved. The codes documented in this report are preliminary and upon coder review may  be revised to meet current compliance requirements. Docia Chuck. Henrene Pastor, MD 09/29/2019 10:03:30 AM This report has been signed electronically. Number of Addenda: 0

## 2019-09-29 NOTE — Transfer of Care (Signed)
Immediate Anesthesia Transfer of Care Note  Patient: [name] [name]  Procedure(s) Performed: COLONOSCOPY WITH PROPOFOL (N/A ) POLYPECTOMY  Patient Location: PACU  Anesthesia Type:MAC  Level of Consciousness: awake and patient cooperative  Airway & Oxygen Therapy: Patient Spontanous Breathing and Patient connected to face mask  Post-op Assessment: Report given to RN and Post -op Vital signs reviewed and stable  Post vital signs: Reviewed and stable  Last Vitals:  Vitals Value Taken Time  BP 163/94 09/29/19 0956  Temp    Pulse 67 09/29/19 0957  Resp 18 09/29/19 0957  SpO2 100 % 09/29/19 0957  Vitals shown include unvalidated device data.  Last Pain:  Vitals:   09/29/19 0831  TempSrc: Oral  PainSc: 0-No pain         Complications: No apparent anesthesia complications

## 2019-09-29 NOTE — Anesthesia Preprocedure Evaluation (Signed)
Anesthesia Evaluation  Patient identified by MRN, date of birth, ID band Patient awake    Reviewed: Allergy & Precautions, NPO status , Patient's Chart, lab work & pertinent test results  History of Anesthesia Complications (+) DIFFICULT AIRWAY and history of anesthetic complications  Airway Mallampati: IV  TM Distance: <3 FB Neck ROM: Full  Mouth opening: Limited Mouth Opening Comment: beard Dental  (+) Edentulous Upper, Missing, Poor Dentition,    Pulmonary neg shortness of breath, neg recent URI, former smoker,    breath sounds clear to auscultation       Cardiovascular hypertension, Pt. on medications (-) angina(-) Past MI and (-) CHF  Rhythm:Regular     Neuro/Psych  Headaches, negative psych ROS   GI/Hepatic Neg liver ROS, GERD  ,  Endo/Other  diabetes, Type 2  Renal/GU Renal diseaseS/p resection of neoplasm     Musculoskeletal  (+) Arthritis ,   Abdominal   Peds  Hematology negative hematology ROS (+)   Anesthesia Other Findings   Reproductive/Obstetrics                             Anesthesia Physical Anesthesia Plan  ASA: II  Anesthesia Plan: MAC   Post-op Pain Management:    Induction: Intravenous  PONV Risk Score and Plan: Treatment may vary due to age or medical condition and Propofol infusion  Airway Management Planned: Nasal Cannula  Additional Equipment:   Intra-op Plan:   Post-operative Plan:   Informed Consent: I have reviewed the patients History and Physical, chart, labs and discussed the procedure including the risks, benefits and alternatives for the proposed anesthesia with the patient or authorized representative who has indicated his/her understanding and acceptance.     Dental advisory given  Plan Discussed with: CRNA and Surgeon  Anesthesia Plan Comments:         Anesthesia Quick Evaluation

## 2019-09-30 ENCOUNTER — Encounter: Payer: Self-pay | Admitting: *Deleted

## 2019-09-30 LAB — SURGICAL PATHOLOGY

## 2019-09-30 NOTE — Progress Notes (Signed)
Attempted, unable to leave message/bt 

## 2019-09-30 NOTE — Anesthesia Postprocedure Evaluation (Signed)
Anesthesia Post Note  Patient: [name] [name]  Procedure(s) Performed: COLONOSCOPY WITH PROPOFOL (N/A ) POLYPECTOMY     Patient location during evaluation: Endoscopy Anesthesia Type: MAC Level of consciousness: awake and patient cooperative Pain management: pain level controlled Vital Signs Assessment: post-procedure vital signs reviewed and stable Respiratory status: spontaneous breathing, nonlabored ventilation, respiratory function stable and patient connected to nasal cannula oxygen Cardiovascular status: stable and blood pressure returned to baseline Postop Assessment: no apparent nausea or vomiting Anesthetic complications: no    Last Vitals:  Vitals:   09/29/19 1010 09/29/19 1020  BP: (!) 150/86 (!) 165/83  Pulse: (!) 59 60  Resp: 19 15  Temp:    SpO2: 98% 99%    Last Pain:  Vitals:   09/29/19 1000  TempSrc: Oral  PainSc: 0-No pain                 Nelton Amsden

## 2019-10-06 ENCOUNTER — Other Ambulatory Visit: Payer: Self-pay | Admitting: Internal Medicine

## 2019-10-19 ENCOUNTER — Other Ambulatory Visit (INDEPENDENT_AMBULATORY_CARE_PROVIDER_SITE_OTHER): Payer: BC Managed Care – PPO

## 2019-10-19 DIAGNOSIS — E118 Type 2 diabetes mellitus with unspecified complications: Secondary | ICD-10-CM | POA: Diagnosis not present

## 2019-10-19 LAB — BASIC METABOLIC PANEL
BUN: 14 mg/dL (ref 6–23)
CO2: 26 mEq/L (ref 19–32)
Calcium: 9.5 mg/dL (ref 8.4–10.5)
Chloride: 108 mEq/L (ref 96–112)
Creatinine, Ser: 0.78 mg/dL (ref 0.40–1.50)
GFR: 99.82 mL/min (ref >60.00)
Glucose, Bld: 178 mg/dL — ABNORMAL HIGH (ref 70–99)
Potassium: 3.6 mEq/L (ref 3.5–5.1)
Sodium: 145 mEq/L (ref 135–145)

## 2019-10-19 LAB — HEMOGLOBIN A1C: Hgb A1c MFr Bld: 8.2 % — ABNORMAL HIGH (ref 4.6–6.5)

## 2019-10-20 ENCOUNTER — Encounter: Payer: Self-pay | Admitting: Internal Medicine

## 2019-10-20 ENCOUNTER — Ambulatory Visit (INDEPENDENT_AMBULATORY_CARE_PROVIDER_SITE_OTHER): Payer: BC Managed Care – PPO | Admitting: Internal Medicine

## 2019-10-20 ENCOUNTER — Other Ambulatory Visit: Payer: Self-pay

## 2019-10-20 VITALS — BP 162/102 | HR 71 | Temp 97.9°F | Ht 66.0 in | Wt 187.0 lb

## 2019-10-20 DIAGNOSIS — E785 Hyperlipidemia, unspecified: Secondary | ICD-10-CM

## 2019-10-20 DIAGNOSIS — I10 Essential (primary) hypertension: Secondary | ICD-10-CM | POA: Diagnosis not present

## 2019-10-20 DIAGNOSIS — N320 Bladder-neck obstruction: Secondary | ICD-10-CM

## 2019-10-20 DIAGNOSIS — E118 Type 2 diabetes mellitus with unspecified complications: Secondary | ICD-10-CM

## 2019-10-20 DIAGNOSIS — F17200 Nicotine dependence, unspecified, uncomplicated: Secondary | ICD-10-CM | POA: Diagnosis not present

## 2019-10-20 MED ORDER — AMLODIPINE BESYLATE 5 MG PO TABS: 5.0000 mg | ORAL_TABLET | Freq: Every day | ORAL | 3 refills | Status: DC

## 2019-10-20 MED ORDER — TAMSULOSIN HCL 0.4 MG PO CAPS: 0.4000 mg | ORAL_CAPSULE | Freq: Every day | ORAL | 3 refills | Status: DC

## 2019-10-20 MED ORDER — OLMESARTAN MEDOXOMIL 40 MG PO TABS: 40.0000 mg | ORAL_TABLET | Freq: Every day | ORAL | 3 refills | Status: DC

## 2019-10-20 MED ORDER — INVOKAMET XR 50-1000 MG PO TB24: 2.0000 | ORAL_TABLET | Freq: Every day | ORAL | 0 refills | Status: DC

## 2019-10-20 NOTE — Progress Notes (Signed)
Subjective:  Patient ID: [name] [name], male    DOB: 03-Jan-1956  Age: 64 y.o. MRN: [id number]  CC: No chief complaint on file.   HPI [name] [name] presents for DM w/elevated CBGs >200 F/u HTN C/o wt gain C/o smoker's cough   Outpatient Medications Prior to Visit  Medication Sig Dispense Refill  . amLODipine (NORVASC) 5 MG tablet TAKE 1 TABLET BY MOUTH EVERY DAY (Patient taking differently: Take 5 mg by mouth daily. ) 90 tablet 3  . Ascorbic Acid (VITAMIN C) 1000 MG tablet Take 1,000 mg by mouth daily as needed (immunine).    . blood glucose meter kit and supplies KIT Use to test blood sugar once a day. DX: E11.9 1 each 0  . Canagliflozin-metFORMIN HCl ER (INVOKAMET XR) 50-1000 MG TB24 Take 2 tablets by mouth at bedtime. Overdue for Annual appt must see provider for future refills 60 tablet 0  . cholecalciferol (VITAMIN D) 1000 UNITS tablet Take 1,000 Units by mouth daily.    Marland Kitchen glucose blood test strip Use to check blood sugar once per day. 100 each 12  . Lancets MISC Use to check blood sugar once per day. 100 each 3  . losartan (COZAAR) 100 MG tablet Take 1 tablet (100 mg total) by mouth daily. 90 tablet 3  . NONFORMULARY OR COMPOUNDED ITEM [facility]:  Antifungal Cream - Terbinafine 3%, Fluconazole 2%, Tea Tree Oil 5%, Urea 10%, Ibuprofen 2% in DMSO suspension #66m. Apply to the affected nail(s) once (at bedtime) or twice daily. 30 each 5  . potassium chloride SA (K-DUR,KLOR-CON) 20 MEQ tablet Take 1 tablet (20 mEq total) by mouth daily. 90 tablet 3  . triamcinolone ointment (KENALOG) 0.1 % Apply 1 application topically 2 (two) times daily. 30 g 2   No facility-administered medications prior to visit.    ROS: Review of Systems  Constitutional: Positive for unexpected weight change. Negative for appetite change and fatigue.  HENT: Negative for congestion, nosebleeds, sneezing, sore throat and trouble swallowing.   Eyes: Negative for itching and visual disturbance.   Respiratory: Positive for cough. Negative for shortness of breath and wheezing.   Cardiovascular: Negative for chest pain, palpitations and leg swelling.  Gastrointestinal: Negative for abdominal distention, blood in stool, diarrhea and nausea.  Genitourinary: Negative for frequency and hematuria.  Musculoskeletal: Negative for back pain, gait problem, joint swelling and neck pain.  Skin: Negative for rash.  Neurological: Negative for dizziness, tremors, speech difficulty and weakness.  Psychiatric/Behavioral: Negative for agitation, dysphoric mood, sleep disturbance and suicidal ideas. The patient is not nervous/anxious.     Objective:  BP (!) 162/102   Pulse 71   Temp 97.9 F (36.6 C) (Oral)   Ht _0  (1.676 m)   Wt 187 lb (84.8 kg)   SpO2 98%   BMI 30.18 kg/m   BP Readings from Last 3 Encounters:  10/20/19 (!) 162/102  09/29/19 (!) 165/83  08/27/18 (!) 152/78    Wt Readings from Last 3 Encounters:  10/20/19 187 lb (84.8 kg)  09/29/19 178 lb 9.2 oz (81 kg)  07/21/19 187 lb 6.4 oz (85 kg)    Physical Exam Constitutional:      General: He is not in acute distress.    Appearance: He is well-developed. He is obese.     Comments: NAD  Eyes:     Conjunctiva/sclera: Conjunctivae normal.     Pupils: Pupils are equal, round, and reactive to light.  Neck:  Thyroid: No thyromegaly.     Vascular: No JVD.  Cardiovascular:     Rate and Rhythm: Normal rate and regular rhythm.     Heart sounds: Normal heart sounds. No murmur. No friction rub. No gallop.   Pulmonary:     Effort: Pulmonary effort is normal. No respiratory distress.     Breath sounds: Normal breath sounds. No wheezing or rales.  Chest:     Chest wall: No tenderness.  Abdominal:     General: Bowel sounds are normal. There is no distension.     Palpations: Abdomen is soft. There is no mass.     Tenderness: There is no abdominal tenderness. There is no guarding or rebound.  Musculoskeletal:        General:  No tenderness. Normal range of motion.     Cervical back: Normal range of motion.  Lymphadenopathy:     Cervical: No cervical adenopathy.  Skin:    General: Skin is warm and dry.     Findings: No rash.  Neurological:     Mental Status: He is alert and oriented to person, place, and time.     Cranial Nerves: No cranial nerve deficit.     Motor: No abnormal muscle tone.     Coordination: Coordination normal.     Gait: Gait normal.     Deep Tendon Reflexes: Reflexes are normal and symmetric.  Psychiatric:        Behavior: Behavior normal.        Thought Content: Thought content normal.        Judgment: Judgment normal.     Lab Results  Component Value Date   WBC 7.5 12/11/2017   HGB 16.8 12/11/2017   HCT 48.4 12/11/2017   PLT 163.0 12/11/2017   GLUCOSE 178 (H) 10/19/2019   CHOL 123 12/11/2017   TRIG 56.0 12/11/2017   HDL 32.50 (L) 12/11/2017   LDLCALC 79 12/11/2017   ALT 32 12/11/2017   AST 17 12/11/2017   NA 145 10/19/2019   K 3.6 10/19/2019   CL 108 10/19/2019   CREATININE 0.78 10/19/2019   BUN 14 10/19/2019   CO2 26 10/19/2019   TSH 0.94 06/21/2016   PSA 2.92 06/21/2016   INR 1.1 (H) 01/08/2012   HGBA1C 8.2 (H) 10/19/2019    No results found.  Assessment & Plan:   There are no diagnoses linked to this encounter.   No orders of the defined types were placed in this encounter.    Follow-up: No follow-ups on file.  Walker Kehr, MD

## 2019-10-20 NOTE — Patient Instructions (Signed)

## 2019-10-20 NOTE — Assessment & Plan Note (Signed)
Discussed - 1/3 ppd He is trying

## 2019-10-20 NOTE — Assessment & Plan Note (Signed)
Flomax F/u w/Urology PSA

## 2019-10-20 NOTE — Assessment & Plan Note (Signed)
Diet, wt loss 

## 2019-10-20 NOTE — Assessment & Plan Note (Signed)
Olmesartan; Norvasc 

## 2019-12-08 MED ORDER — CONTOUR NEXT MONITOR W/DEVICE KIT: 1.0000 | PACK | Freq: Every day | 0 refills | Status: AC

## 2019-12-23 MED ORDER — INVOKAMET XR 50-1000 MG PO TB24: 2.0000 | ORAL_TABLET | Freq: Every day | ORAL | 3 refills | Status: DC

## 2019-12-30 ENCOUNTER — Other Ambulatory Visit: Payer: Self-pay | Admitting: Internal Medicine

## 2019-12-30 DIAGNOSIS — E118 Type 2 diabetes mellitus with unspecified complications: Secondary | ICD-10-CM

## 2020-01-18 ENCOUNTER — Ambulatory Visit: Payer: BC Managed Care – PPO | Admitting: Internal Medicine

## 2020-01-23 ENCOUNTER — Ambulatory Visit: Payer: BC Managed Care – PPO | Attending: Internal Medicine

## 2020-01-23 DIAGNOSIS — Z23 Encounter for immunization: Secondary | ICD-10-CM

## 2020-01-23 NOTE — Progress Notes (Signed)
   Covid-19 Vaccination Clinic  Name:  KIJANI CRILLEY    MRN: [id number] DOB: 1956-08-30  01/23/2020  Mr. Reibel was observed post Covid-19 immunization for 15 minutes without incident. He was provided with Vaccine Information Sheet and instruction to access the V-Safe system.   Mr. Greason was instructed to call 911 with any severe reactions post vaccine: Marland Kitchen Difficulty breathing  . Swelling of face and throat  . A fast heartbeat  . A bad rash all over body  . Dizziness and weakness   Immunizations Administered    Name Date Dose VIS Date Route   Pfizer COVID-19 Vaccine 01/23/2020  1:17 PM 0.3 mL 09/25/2019 Intramuscular   Manufacturer: [place]   Lot: [id number]   [place]: [id number]

## 2020-02-05 ENCOUNTER — Other Ambulatory Visit (INDEPENDENT_AMBULATORY_CARE_PROVIDER_SITE_OTHER): Payer: BC Managed Care – PPO

## 2020-02-05 DIAGNOSIS — E785 Hyperlipidemia, unspecified: Secondary | ICD-10-CM | POA: Diagnosis not present

## 2020-02-05 DIAGNOSIS — N320 Bladder-neck obstruction: Secondary | ICD-10-CM

## 2020-02-05 DIAGNOSIS — E118 Type 2 diabetes mellitus with unspecified complications: Secondary | ICD-10-CM

## 2020-02-05 DIAGNOSIS — I10 Essential (primary) hypertension: Secondary | ICD-10-CM

## 2020-02-05 LAB — HEPATIC FUNCTION PANEL
ALT: 20 U/L (ref 0–53)
AST: 12 U/L (ref 0–37)
Albumin: 4.5 g/dL (ref 3.5–5.2)
Alkaline Phosphatase: 101 U/L (ref 39–117)
Bilirubin, Direct: 0.1 mg/dL (ref 0.0–0.3)
Total Bilirubin: 0.4 mg/dL (ref 0.2–1.2)
Total Protein: 6.5 g/dL (ref 6.0–8.3)

## 2020-02-05 LAB — CBC WITH DIFFERENTIAL/PLATELET
Basophils Absolute: 0.1 K/uL (ref 0.0–0.1)
Basophils Relative: 1 % (ref 0.0–3.0)
Eosinophils Absolute: 0.4 K/uL (ref 0.0–0.7)
Eosinophils Relative: 4.1 % (ref 0.0–5.0)
HCT: 53 % — ABNORMAL HIGH (ref 39.0–52.0)
Hemoglobin: 17.9 g/dL — ABNORMAL HIGH (ref 13.0–17.0)
Lymphocytes Relative: 14.9 % (ref 12.0–46.0)
Lymphs Abs: 1.3 K/uL (ref 0.7–4.0)
MCHC: 33.7 g/dL (ref 30.0–36.0)
MCV: 88.7 fl (ref 78.0–100.0)
Monocytes Absolute: 0.7 K/uL (ref 0.1–1.0)
Monocytes Relative: 7.9 % (ref 3.0–12.0)
Neutro Abs: 6.3 K/uL (ref 1.4–7.7)
Neutrophils Relative %: 72.1 % (ref 43.0–77.0)
Platelets: 156 K/uL (ref 150.0–400.0)
RBC: 5.98 Mil/uL — ABNORMAL HIGH (ref 4.22–5.81)
RDW: 15 % (ref 11.5–15.5)
WBC: 8.7 K/uL (ref 4.0–10.5)

## 2020-02-05 LAB — BASIC METABOLIC PANEL
BUN: 19 mg/dL (ref 6–23)
CO2: 26 mEq/L (ref 19–32)
Calcium: 9.3 mg/dL (ref 8.4–10.5)
Chloride: 107 mEq/L (ref 96–112)
Creatinine, Ser: 0.84 mg/dL (ref 0.40–1.50)
GFR: 91.55 mL/min (ref >60.00)
Glucose, Bld: 124 mg/dL — ABNORMAL HIGH (ref 70–99)
Potassium: 3.8 mEq/L (ref 3.5–5.1)
Sodium: 144 mEq/L (ref 135–145)

## 2020-02-05 LAB — LIPID PANEL
Cholesterol: 149 mg/dL (ref 0–200)
HDL: 31.2 mg/dL — ABNORMAL LOW (ref >39.00)
LDL Cholesterol: 93 mg/dL (ref 0–99)
NonHDL: 118.24
Total CHOL/HDL Ratio: 5
Triglycerides: 127 mg/dL (ref 0.0–149.0)
VLDL: 25.4 mg/dL (ref 0.0–40.0)

## 2020-02-05 LAB — HEMOGLOBIN A1C: Hgb A1c MFr Bld: 7 % — ABNORMAL HIGH (ref 4.6–6.5)

## 2020-02-05 LAB — PSA: PSA: 3.59 ng/mL (ref 0.10–4.00)

## 2020-02-05 LAB — TSH: TSH: 0.86 u[IU]/mL (ref 0.35–4.50)

## 2020-02-08 ENCOUNTER — Encounter: Payer: Self-pay | Admitting: Internal Medicine

## 2020-02-08 ENCOUNTER — Other Ambulatory Visit: Payer: Self-pay

## 2020-02-08 ENCOUNTER — Ambulatory Visit: Payer: BC Managed Care – PPO | Admitting: Internal Medicine

## 2020-02-08 DIAGNOSIS — D751 Secondary polycythemia: Secondary | ICD-10-CM

## 2020-02-08 DIAGNOSIS — F17200 Nicotine dependence, unspecified, uncomplicated: Secondary | ICD-10-CM | POA: Diagnosis not present

## 2020-02-08 DIAGNOSIS — E118 Type 2 diabetes mellitus with unspecified complications: Secondary | ICD-10-CM

## 2020-02-08 DIAGNOSIS — N320 Bladder-neck obstruction: Secondary | ICD-10-CM

## 2020-02-08 DIAGNOSIS — E785 Hyperlipidemia, unspecified: Secondary | ICD-10-CM

## 2020-02-08 DIAGNOSIS — I10 Essential (primary) hypertension: Secondary | ICD-10-CM

## 2020-02-08 MED ORDER — REPAGLINIDE 2 MG PO TABS: 2.0000 mg | ORAL_TABLET | Freq: Three times a day (TID) | ORAL | 3 refills | Status: DC

## 2020-02-08 MED ORDER — FINASTERIDE 5 MG PO TABS
5.0000 mg | ORAL_TABLET | Freq: Every day | ORAL | 3 refills | Status: DC
Start: 2020-02-08 — End: 2020-12-26

## 2020-02-08 MED ORDER — INVOKAMET XR 50-1000 MG PO TB24: 2.0000 | ORAL_TABLET | Freq: Every day | ORAL | 3 refills | Status: DC

## 2020-02-08 NOTE — Assessment & Plan Note (Signed)
Declined statins. 

## 2020-02-08 NOTE — Progress Notes (Signed)
Subjective:  Patient ID: [name] [name], male    DOB: [birth year]  Age: 64 y.o. MRN: [id number]  CC: No chief complaint on file.   HPI [name] [name] presents for DM, dyslipidemia, abn CBC f/u  Outpatient Medications Prior to Visit  Medication Sig Dispense Refill  . amLODipine (NORVASC) 5 MG tablet Take 1 tablet (5 mg total) by mouth daily. 90 tablet 3  . Ascorbic Acid (VITAMIN C) 1000 MG tablet Take 1,000 mg by mouth daily as needed (immunine).    . blood glucose meter kit and supplies KIT Use to test blood sugar once a day. DX: E11.9 1 each 0  . Blood Glucose Monitoring Suppl (CONTOUR NEXT MONITOR) w/Device KIT 1 Device by Does not apply route daily. 1 kit 0  . Canagliflozin-metFORMIN HCl ER (INVOKAMET XR) 50-1000 MG TB24 Take 2 tablets by mouth at bedtime. 60 tablet 3  . cholecalciferol (VITAMIN D) 1000 UNITS tablet Take 1,000 Units by mouth daily.    . CONTOUR NEXT TEST test strip USE AS DIRECTED 100 strip 0  . Lancets MISC Use to check blood sugar once per day. 100 each 3  . NONFORMULARY OR COMPOUNDED ITEM [facility]:  Antifungal Cream - Terbinafine 3%, Fluconazole 2%, Tea Tree Oil 5%, Urea 10%, Ibuprofen 2% in DMSO suspension #67m. Apply to the affected nail(s) once (at bedtime) or twice daily. 30 each 5  . olmesartan (BENICAR) 40 MG tablet Take 1 tablet (40 mg total) by mouth daily. 90 tablet 3  . potassium chloride SA (K-DUR,KLOR-CON) 20 MEQ tablet Take 1 tablet (20 mEq total) by mouth daily. (Patient not taking: Reported on 02/08/2020) 90 tablet 3  . tamsulosin (FLOMAX) 0.4 MG CAPS capsule Take 1 capsule (0.4 mg total) by mouth daily. (Patient not taking: Reported on 02/08/2020) 90 capsule 3  . triamcinolone ointment (KENALOG) 0.1 % Apply 1 application topically 2 (two) times daily. (Patient not taking: Reported on 02/08/2020) 30 g 2   No facility-administered medications prior to visit.    ROS: Review of Systems  Constitutional: Negative for appetite change,  fatigue and unexpected weight change.  HENT: Negative for congestion, nosebleeds, sneezing, sore throat and trouble swallowing.   Eyes: Negative for itching and visual disturbance.  Respiratory: Negative for cough.   Cardiovascular: Negative for chest pain, palpitations and leg swelling.  Gastrointestinal: Negative for abdominal distention, blood in stool, diarrhea and nausea.  Genitourinary: Negative for frequency and hematuria.  Musculoskeletal: Negative for back pain, gait problem, joint swelling and neck pain.  Skin: Negative for rash.  Neurological: Negative for dizziness, tremors, speech difficulty and weakness.  Psychiatric/Behavioral: Negative for agitation, dysphoric mood and sleep disturbance. The patient is not nervous/anxious.     Objective:  BP 130/78 (BP Location: Left Arm, Patient Position: Sitting, Cuff Size: Large)   Pulse 74   Temp 98.5 F (36.9 C) (Oral)   Ht _0  (1.676 m)   Wt 186 lb (84.4 kg)   SpO2 96%   BMI 30.02 kg/m   BP Readings from Last 3 Encounters:  02/08/20 130/78  10/20/19 (!) 162/102  09/29/19 (!) 165/83    Wt Readings from Last 3 Encounters:  02/08/20 186 lb (84.4 kg)  10/20/19 187 lb (84.8 kg)  09/29/19 178 lb 9.2 oz (81 kg)    Physical Exam Constitutional:      General: He is not in acute distress.    Appearance: He is well-developed. He is obese.     Comments: NAD  Eyes:  Conjunctiva/sclera: Conjunctivae normal.     Pupils: Pupils are equal, round, and reactive to light.  Neck:     Thyroid: No thyromegaly.     Vascular: No JVD.  Cardiovascular:     Rate and Rhythm: Normal rate and regular rhythm.     Heart sounds: Normal heart sounds. No murmur. No friction rub. No gallop.   Pulmonary:     Effort: Pulmonary effort is normal. No respiratory distress.     Breath sounds: Normal breath sounds. No wheezing or rales.  Chest:     Chest wall: No tenderness.  Abdominal:     General: Bowel sounds are normal. There is no  distension.     Palpations: Abdomen is soft. There is no mass.     Tenderness: There is no abdominal tenderness. There is no guarding or rebound.  Musculoskeletal:        General: No tenderness. Normal range of motion.     Cervical back: Normal range of motion.  Lymphadenopathy:     Cervical: No cervical adenopathy.  Skin:    General: Skin is warm and dry.     Findings: No rash.  Neurological:     Mental Status: He is alert and oriented to person, place, and time.     Cranial Nerves: No cranial nerve deficit.     Motor: No abnormal muscle tone.     Coordination: Coordination normal.     Gait: Gait normal.     Deep Tendon Reflexes: Reflexes are normal and symmetric.  Psychiatric:        Behavior: Behavior normal.        Thought Content: Thought content normal.        Judgment: Judgment normal.     Lab Results  Component Value Date   WBC 8.7 02/05/2020   HGB 17.9 (H) 02/05/2020   HCT 53.0 (H) 02/05/2020   PLT 156.0 02/05/2020   GLUCOSE 124 (H) 02/05/2020   CHOL 149 02/05/2020   TRIG 127.0 02/05/2020   HDL 31.20 (L) 02/05/2020   LDLCALC 93 02/05/2020   ALT 20 02/05/2020   AST 12 02/05/2020   NA 144 02/05/2020   K 3.8 02/05/2020   CL 107 02/05/2020   CREATININE 0.84 02/05/2020   BUN 19 02/05/2020   CO2 26 02/05/2020   TSH 0.86 02/05/2020   PSA 3.59 02/05/2020   INR 1.1 (H) 01/08/2012   HGBA1C 7.0 (H) 02/05/2020    No results found.  Assessment & Plan:   There are no diagnoses linked to this encounter.   No orders of the defined types were placed in this encounter.    Follow-up: No follow-ups on file.  Walker Kehr, MD

## 2020-02-08 NOTE — Assessment & Plan Note (Signed)
Discussed.

## 2020-02-08 NOTE — Assessment & Plan Note (Signed)
A1c 7.0% Invokamet, Prandin

## 2020-02-08 NOTE — Assessment & Plan Note (Signed)
Finasteride

## 2020-02-08 NOTE — Assessment & Plan Note (Signed)
due to smoking  Denies OSA Labs in 1 month ASA 81 mg  Qd Can become a  Blood donor

## 2020-02-08 NOTE — Assessment & Plan Note (Signed)
BP Readings from Last 3 Encounters:  02/08/20 130/78  10/20/19 (!) 162/102  09/29/19 (!) 165/83

## 2020-02-09 ENCOUNTER — Encounter: Payer: Self-pay | Admitting: Internal Medicine

## 2020-02-15 ENCOUNTER — Ambulatory Visit: Payer: BC Managed Care – PPO | Attending: Internal Medicine

## 2020-02-15 DIAGNOSIS — Z23 Encounter for immunization: Secondary | ICD-10-CM

## 2020-02-15 NOTE — Progress Notes (Signed)
   Covid-[facility] Vaccination [facility]  Name:  [name] [name]    MRN: [id number] DOB: 1956/09/09  02/15/2020  [name] was observed post Covid-19 immunization for 15 minutes without incident. He was provided with Vaccine Information Sheet and instruction to access the V-Safe system.   [name] was instructed to call 911 with any severe reactions post vaccine: Marland Kitchen Difficulty breathing  . Swelling of face and throat  . A fast heartbeat  . A bad rash all over body  . Dizziness and weakness   Immunizations Administered    Name Date Dose VIS Date Route   Pfizer COVID-19 Vaccine 02/15/2020  1:01 PM 0.3 mL 12/09/2018 Intramuscular   Manufacturer: [place]   Lot: [id number]   [place]owler: [id number]

## 2020-03-20 ENCOUNTER — Other Ambulatory Visit: Payer: Self-pay | Admitting: Internal Medicine

## 2020-03-20 DIAGNOSIS — E118 Type 2 diabetes mellitus with unspecified complications: Secondary | ICD-10-CM

## 2020-05-09 ENCOUNTER — Ambulatory Visit: Payer: BC Managed Care – PPO | Admitting: Internal Medicine

## 2020-05-20 ENCOUNTER — Other Ambulatory Visit (INDEPENDENT_AMBULATORY_CARE_PROVIDER_SITE_OTHER): Payer: BC Managed Care – PPO

## 2020-05-20 DIAGNOSIS — E118 Type 2 diabetes mellitus with unspecified complications: Secondary | ICD-10-CM

## 2020-05-20 LAB — HEMOGLOBIN A1C: Hgb A1c MFr Bld: 7 % — ABNORMAL HIGH (ref 4.6–6.5)

## 2020-05-20 LAB — BASIC METABOLIC PANEL
BUN: 17 mg/dL (ref 6–23)
CO2: 26 mEq/L (ref 19–32)
Calcium: 9.4 mg/dL (ref 8.4–10.5)
Chloride: 107 mEq/L (ref 96–112)
Creatinine, Ser: 0.77 mg/dL (ref 0.40–1.50)
GFR: 101.13 mL/min (ref >60.00)
Glucose, Bld: 122 mg/dL — ABNORMAL HIGH (ref 70–99)
Potassium: 3.8 mEq/L (ref 3.5–5.1)
Sodium: 140 mEq/L (ref 135–145)

## 2020-05-23 ENCOUNTER — Encounter: Payer: Self-pay | Admitting: Internal Medicine

## 2020-05-23 ENCOUNTER — Other Ambulatory Visit: Payer: Self-pay

## 2020-05-23 ENCOUNTER — Ambulatory Visit: Payer: BC Managed Care – PPO | Admitting: Internal Medicine

## 2020-05-23 DIAGNOSIS — E118 Type 2 diabetes mellitus with unspecified complications: Secondary | ICD-10-CM

## 2020-05-23 DIAGNOSIS — N200 Calculus of kidney: Secondary | ICD-10-CM

## 2020-05-23 DIAGNOSIS — N320 Bladder-neck obstruction: Secondary | ICD-10-CM | POA: Diagnosis not present

## 2020-05-23 DIAGNOSIS — I10 Essential (primary) hypertension: Secondary | ICD-10-CM

## 2020-05-23 DIAGNOSIS — E278 Other specified disorders of adrenal gland: Secondary | ICD-10-CM

## 2020-05-23 DIAGNOSIS — D751 Secondary polycythemia: Secondary | ICD-10-CM

## 2020-05-23 MED ORDER — SYNJARDY XR 12.5-1000 MG PO TB24: 1.0000 | ORAL_TABLET | Freq: Two times a day (BID) | ORAL | 3 refills | Status: DC

## 2020-05-23 NOTE — Progress Notes (Signed)
Subjective:  Patient ID: [name] [name], male    DOB: 06-23-1956  Age: 64 y.o. MRN: [id number]  CC: No chief complaint on file.   HPI [name] [name] presents for DM - Invokamet is not covered any longer F/u HTN, BPH  Outpatient Medications Prior to Visit  Medication Sig Dispense Refill  . amLODipine (NORVASC) 5 MG tablet Take 1 tablet (5 mg total) by mouth daily. 90 tablet 3  . Ascorbic Acid (VITAMIN C) 1000 MG tablet Take 1,000 mg by mouth daily as needed (immunine).    . blood glucose meter kit and supplies KIT Use to test blood sugar once a day. DX: E11.9 1 each 0  . Blood Glucose Monitoring Suppl (CONTOUR NEXT MONITOR) w/Device KIT 1 Device by Does not apply route daily. 1 kit 0  . Canagliflozin-metFORMIN HCl ER (INVOKAMET XR) 50-1000 MG TB24 Take 2 tablets by mouth at bedtime. 180 tablet 3  . cholecalciferol (VITAMIN D) 1000 UNITS tablet Take 1,000 Units by mouth daily.    . CONTOUR NEXT TEST test strip USE AS DIRECTED 100 strip 5  . finasteride (PROSCAR) 5 MG tablet Take 1 tablet (5 mg total) by mouth daily. 100 tablet 3  . Lancets MISC Use to check blood sugar once per day. 100 each 3  . NONFORMULARY OR COMPOUNDED ITEM [facility]:  Antifungal Cream - Terbinafine 3%, Fluconazole 2%, Tea Tree Oil 5%, Urea 10%, Ibuprofen 2% in DMSO suspension #59m. Apply to the affected nail(s) once (at bedtime) or twice daily. 30 each 5  . olmesartan (BENICAR) 40 MG tablet Take 1 tablet (40 mg total) by mouth daily. 90 tablet 3  . repaglinide (PRANDIN) 2 MG tablet Take 1 tablet (2 mg total) by mouth 3 (three) times daily before meals. 270 tablet 3   No facility-administered medications prior to visit.    ROS: Review of Systems  Constitutional: Negative for appetite change, fatigue and unexpected weight change.  HENT: Negative for congestion, nosebleeds, sneezing, sore throat and trouble swallowing.   Eyes: Negative for itching and visual disturbance.  Respiratory: Negative for  cough.   Cardiovascular: Negative for chest pain, palpitations and leg swelling.  Gastrointestinal: Negative for abdominal distention, blood in stool, diarrhea and nausea.  Genitourinary: Negative for frequency and hematuria.  Musculoskeletal: Negative for back pain, gait problem, joint swelling and neck pain.  Skin: Negative for rash.  Neurological: Negative for dizziness, tremors, speech difficulty and weakness.  Psychiatric/Behavioral: Negative for agitation, dysphoric mood and sleep disturbance. The patient is not nervous/anxious.     Objective:  BP 130/90 (BP Location: Left Arm, Patient Position: Sitting, Cuff Size: Large)   Pulse 86   Temp 98.2 F (36.8 C) (Oral)   Ht '5\' 6"'$  (1.676 m)   Wt 188 lb (85.3 kg)   SpO2 97%   BMI 30.34 kg/m   BP Readings from Last 3 Encounters:  05/23/20 130/90  02/08/20 130/78  10/20/19 (!) 162/102    Wt Readings from Last 3 Encounters:  05/23/20 188 lb (85.3 kg)  02/08/20 186 lb (84.4 kg)  10/20/19 187 lb (84.8 kg)    Physical Exam Constitutional:      General: He is not in acute distress.    Appearance: He is well-developed. He is obese.     Comments: NAD  Eyes:     Conjunctiva/sclera: Conjunctivae normal.     Pupils: Pupils are equal, round, and reactive to light.  Neck:     Thyroid: No thyromegaly.  Vascular: No JVD.  Cardiovascular:     Rate and Rhythm: Normal rate and regular rhythm.     Heart sounds: Normal heart sounds. No murmur heard.  No friction rub. No gallop.   Pulmonary:     Effort: Pulmonary effort is normal. No respiratory distress.     Breath sounds: Normal breath sounds. No wheezing or rales.  Chest:     Chest wall: No tenderness.  Abdominal:     General: Bowel sounds are normal. There is no distension.     Palpations: Abdomen is soft. There is no mass.     Tenderness: There is no abdominal tenderness. There is no guarding or rebound.  Musculoskeletal:        General: No tenderness. Normal range of  motion.     Cervical back: Normal range of motion.  Lymphadenopathy:     Cervical: No cervical adenopathy.  Skin:    General: Skin is warm and dry.     Findings: No rash.  Neurological:     Mental Status: He is alert and oriented to person, place, and time.     Cranial Nerves: No cranial nerve deficit.     Motor: No abnormal muscle tone.     Coordination: Coordination normal.     Gait: Gait normal.     Deep Tendon Reflexes: Reflexes are normal and symmetric.  Psychiatric:        Behavior: Behavior normal.        Thought Content: Thought content normal.        Judgment: Judgment normal.     Lab Results  Component Value Date   WBC 8.7 02/05/2020   HGB 17.9 (H) 02/05/2020   HCT 53.0 (H) 02/05/2020   PLT 156.0 02/05/2020   GLUCOSE 122 (H) 05/20/2020   CHOL 149 02/05/2020   TRIG 127.0 02/05/2020   HDL 31.20 (L) 02/05/2020   LDLCALC 93 02/05/2020   ALT 20 02/05/2020   AST 12 02/05/2020   NA 140 05/20/2020   K 3.8 05/20/2020   CL 107 05/20/2020   CREATININE 0.77 05/20/2020   BUN 17 05/20/2020   CO2 26 05/20/2020   TSH 0.86 02/05/2020   PSA 3.59 02/05/2020   INR 1.1 (H) 01/08/2012   HGBA1C 7.0 (H) 05/20/2020    No results found.  Assessment & Plan:    Walker Kehr, MD

## 2020-05-23 NOTE — Assessment & Plan Note (Signed)
Chronic Olmesartan; Norvasc

## 2020-05-23 NOTE — Assessment & Plan Note (Signed)
Invokamet is not covered any longer. Start [place]

## 2020-05-23 NOTE — Assessment & Plan Note (Signed)
F/u w/Dr Wrenn  

## 2020-05-23 NOTE — Assessment & Plan Note (Signed)
Finesteride 

## 2020-05-23 NOTE — Assessment & Plan Note (Signed)
CBC

## 2020-07-20 DIAGNOSIS — Z20822 Contact with and (suspected) exposure to covid-19: Secondary | ICD-10-CM | POA: Diagnosis not present

## 2020-07-20 DIAGNOSIS — Z20828 Contact with and (suspected) exposure to other viral communicable diseases: Secondary | ICD-10-CM | POA: Diagnosis not present

## 2020-07-20 DIAGNOSIS — A09 Infectious gastroenteritis and colitis, unspecified: Secondary | ICD-10-CM | POA: Diagnosis not present

## 2020-08-03 DIAGNOSIS — Z85528 Personal history of other malignant neoplasm of kidney: Secondary | ICD-10-CM | POA: Diagnosis not present

## 2020-08-03 DIAGNOSIS — R3915 Urgency of urination: Secondary | ICD-10-CM | POA: Diagnosis not present

## 2020-08-03 DIAGNOSIS — N401 Enlarged prostate with lower urinary tract symptoms: Secondary | ICD-10-CM | POA: Diagnosis not present

## 2020-08-03 DIAGNOSIS — Z87442 Personal history of urinary calculi: Secondary | ICD-10-CM | POA: Diagnosis not present

## 2020-08-03 DIAGNOSIS — N5201 Erectile dysfunction due to arterial insufficiency: Secondary | ICD-10-CM | POA: Diagnosis not present

## 2020-08-15 ENCOUNTER — Other Ambulatory Visit (HOSPITAL_COMMUNITY): Payer: Self-pay | Admitting: Urology

## 2020-08-15 ENCOUNTER — Other Ambulatory Visit: Payer: Self-pay

## 2020-08-15 ENCOUNTER — Ambulatory Visit (HOSPITAL_COMMUNITY)
Admission: RE | Admit: 2020-08-15 | Discharge: 2020-08-15 | Disposition: A | Discharge status: Home / Self Care | Payer: BC Managed Care – PPO | Source: Ambulatory Visit | Attending: Urology | Admitting: Urology

## 2020-08-15 DIAGNOSIS — Z85528 Personal history of other malignant neoplasm of kidney: Secondary | ICD-10-CM | POA: Insufficient documentation

## 2020-08-15 DIAGNOSIS — R918 Other nonspecific abnormal finding of lung field: Secondary | ICD-10-CM | POA: Diagnosis not present

## 2020-08-23 ENCOUNTER — Ambulatory Visit: Payer: BC Managed Care – PPO | Admitting: Internal Medicine

## 2020-09-05 ENCOUNTER — Other Ambulatory Visit (INDEPENDENT_AMBULATORY_CARE_PROVIDER_SITE_OTHER): Payer: BC Managed Care – PPO

## 2020-09-05 DIAGNOSIS — E118 Type 2 diabetes mellitus with unspecified complications: Secondary | ICD-10-CM

## 2020-09-05 LAB — BASIC METABOLIC PANEL
BUN: 17 mg/dL (ref 6–23)
CO2: 26 mEq/L (ref 19–32)
Calcium: 9 mg/dL (ref 8.4–10.5)
Chloride: 110 mEq/L (ref 96–112)
Creatinine, Ser: 0.73 mg/dL (ref 0.40–1.50)
GFR: 95.07 mL/min (ref >60.00)
Glucose, Bld: 125 mg/dL — ABNORMAL HIGH (ref 70–99)
Potassium: 3.4 mEq/L — ABNORMAL LOW (ref 3.5–5.1)
Sodium: 144 mEq/L (ref 135–145)

## 2020-09-05 LAB — HEMOGLOBIN A1C: Hgb A1c MFr Bld: 7.3 % — ABNORMAL HIGH (ref 4.6–6.5)

## 2020-09-06 ENCOUNTER — Other Ambulatory Visit: Payer: Self-pay

## 2020-09-06 ENCOUNTER — Encounter: Payer: Self-pay | Admitting: Internal Medicine

## 2020-09-06 ENCOUNTER — Ambulatory Visit: Payer: BC Managed Care – PPO | Admitting: Internal Medicine

## 2020-09-06 DIAGNOSIS — I10 Essential (primary) hypertension: Secondary | ICD-10-CM

## 2020-09-06 DIAGNOSIS — N320 Bladder-neck obstruction: Secondary | ICD-10-CM | POA: Diagnosis not present

## 2020-09-06 DIAGNOSIS — E118 Type 2 diabetes mellitus with unspecified complications: Secondary | ICD-10-CM

## 2020-09-06 DIAGNOSIS — E876 Hypokalemia: Secondary | ICD-10-CM

## 2020-09-06 DIAGNOSIS — E785 Hyperlipidemia, unspecified: Secondary | ICD-10-CM | POA: Diagnosis not present

## 2020-09-06 DIAGNOSIS — Z0000 Encounter for general adult medical examination without abnormal findings: Secondary | ICD-10-CM

## 2020-09-06 MED ORDER — POTASSIUM CHLORIDE CRYS ER 20 MEQ PO TBCR: 20.0000 meq | EXTENDED_RELEASE_TABLET | Freq: Every day | ORAL | 11 refills | Status: DC

## 2020-09-06 NOTE — Assessment & Plan Note (Addendum)
A cardiac CT scan for calcium scoring offered 

## 2020-09-06 NOTE — Assessment & Plan Note (Signed)
[name] [name]

## 2020-09-06 NOTE — Assessment & Plan Note (Signed)
Olmesartan; Norvasc Invokamet Rx - d/c  Invokamet is not covered any longer. On Synjardy

## 2020-09-06 NOTE — Assessment & Plan Note (Signed)
Finasteride

## 2020-09-06 NOTE — Patient Instructions (Addendum)
Cardiac CT calcium scoring test $150 Tel # is [phone]   Computed tomography, more commonly known as a CT or CAT scan, is a diagnostic medical imaging test. Like traditional x-rays, it produces multiple images or pictures of the inside of the body. The cross-sectional images generated during a CT scan can be reformatted in multiple planes. They can even generate three-dimensional images. These images can be viewed on a computer monitor, printed on film or by a 3D printer, or transferred to a CD or DVD. CT images of internal organs, bones, soft tissue and blood vessels provide greater detail than traditional x-rays, particularly of soft tissues and blood vessels. A cardiac CT scan for coronary calcium is a non-invasive way of obtaining information about the presence, location and extent of calcified plaque in the coronary arteries-the vessels that supply oxygen-containing blood to the heart muscle. Calcified plaque results when there is a build-up of fat and other substances under the inner layer of the artery. This material can calcify which signals the presence of atherosclerosis, a disease of the vessel wall, also called coronary artery disease (CAD). People with this disease have an increased risk for heart attacks. In addition, over time, progression of plaque build up (CAD) can narrow the arteries or even close off blood flow to the heart. The result may be chest pain, sometimes called "angina," or a heart attack. Because calcium is a marker of CAD, the amount of calcium detected on a cardiac CT scan is a helpful prognostic tool. The findings on cardiac CT are expressed as a calcium score. Another name for this test is coronary artery calcium scoring.  What are some common uses of the procedure? The goal of cardiac CT scan for calcium scoring is to determine if CAD is present and to what extent, even if there are no symptoms. It is a screening study that may be recommended by a physician for  patients with risk factors for CAD but no clinical symptoms. The major risk factors for CAD are: . high blood cholesterol levels  . family history of heart attacks  . diabetes  . high blood pressure  . cigarette smoking  . overweight or obese  . physical inactivity   A negative cardiac CT scan for calcium scoring shows no calcification within the coronary arteries. This suggests that CAD is absent or so minimal it cannot be seen by this technique. The chance of having a heart attack over the next two to five years is very low under these circumstances. A positive test means that CAD is present, regardless of whether or not the patient is experiencing any symptoms. The amount of calcification-expressed as the calcium score-may help to predict the likelihood of a myocardial infarction (heart attack) in the coming years and helps your medical doctor or cardiologist decide whether the patient may need to take preventive medicine or undertake other measures such as diet and exercise to lower the risk for heart attack. The extent of CAD is graded according to your calcium score:  Calcium Score  Presence of CAD (coronary artery disease)  0 No evidence of CAD   1-10 Minimal evidence of CAD  11-100 Mild evidence of CAD  101-400 Moderate evidence of CAD  Over 400 Extensive evidence of CAD       B-complex with Niacin 100 mg    Lion's mane  

## 2020-09-06 NOTE — Progress Notes (Signed)
Subjective:  Patient ID: [name] [name], male    DOB: 29-Apr-1956  Age: 64 y.o. MRN: [id number]  CC: Follow-up (3 month F/U)   HPI AMARU BURROUGHS presents for DM, HTN, BPH f/u  Outpatient Medications Prior to Visit  Medication Sig Dispense Refill  . amLODipine (NORVASC) 5 MG tablet Take 1 tablet (5 mg total) by mouth daily. 90 tablet 3  . Ascorbic Acid (VITAMIN C) 1000 MG tablet Take 1,000 mg by mouth daily as needed (immunine).    . blood glucose meter kit and supplies KIT Use to test blood sugar once a day. DX: E11.9 1 each 0  . Blood Glucose Monitoring Suppl (CONTOUR NEXT MONITOR) w/Device KIT 1 Device by Does not apply route daily. 1 kit 0  . cholecalciferol (VITAMIN D) 1000 UNITS tablet Take 1,000 Units by mouth daily.    . CONTOUR NEXT TEST test strip USE AS DIRECTED 100 strip 5  . Empagliflozin-metFORMIN HCl ER (SYNJARDY XR) 12.02-999 MG TB24 Take 1 tablet by mouth 2 (two) times daily. 180 tablet 3  . finasteride (PROSCAR) 5 MG tablet Take 1 tablet (5 mg total) by mouth daily. 100 tablet 3  . Lancets MISC Use to check blood sugar once per day. 100 each 3  . NONFORMULARY OR COMPOUNDED ITEM [facility]:  Antifungal Cream - Terbinafine 3%, Fluconazole 2%, Tea Tree Oil 5%, Urea 10%, Ibuprofen 2% in DMSO suspension #85m. Apply to the affected nail(s) once (at bedtime) or twice daily. 30 each 5  . olmesartan (BENICAR) 40 MG tablet Take 1 tablet (40 mg total) by mouth daily. 90 tablet 3  . repaglinide (PRANDIN) 2 MG tablet Take 1 tablet (2 mg total) by mouth 3 (three) times daily before meals. 270 tablet 3  . tadalafil (CIALIS) 5 MG tablet Take 5 mg by mouth daily.     No facility-administered medications prior to visit.    ROS: Review of Systems  Constitutional: Negative for appetite change, fatigue and unexpected weight change.  HENT: Negative for congestion, nosebleeds, sneezing, sore throat and trouble swallowing.   Eyes: Negative for itching and visual disturbance.   Respiratory: Negative for cough.   Cardiovascular: Negative for chest pain, palpitations and leg swelling.  Gastrointestinal: Negative for abdominal distention, blood in stool, diarrhea and nausea.  Genitourinary: Negative for frequency and hematuria.  Musculoskeletal: Negative for back pain, gait problem, joint swelling and neck pain.  Skin: Negative for rash.  Neurological: Negative for dizziness, tremors, speech difficulty and weakness.  Psychiatric/Behavioral: Negative for agitation, dysphoric mood, sleep disturbance and suicidal ideas. The patient is not nervous/anxious.     Objective:  BP 140/82 (BP Location: Left Arm)   Pulse 72   Temp 98.5 F (36.9 C) (Oral)   Wt 192 lb (87.1 kg)   SpO2 97%   BMI 30.99 kg/m   BP Readings from Last 3 Encounters:  09/06/20 140/82  05/23/20 130/90  02/08/20 130/78    Wt Readings from Last 3 Encounters:  09/06/20 192 lb (87.1 kg)  05/23/20 188 lb (85.3 kg)  02/08/20 186 lb (84.4 kg)    Physical Exam Constitutional:      General: He is not in acute distress.    Appearance: He is well-developed.     Comments: NAD  Eyes:     Conjunctiva/sclera: Conjunctivae normal.     Pupils: Pupils are equal, round, and reactive to light.  Neck:     Thyroid: No thyromegaly.     Vascular: No JVD.  Cardiovascular:  Rate and Rhythm: Normal rate and regular rhythm.     Heart sounds: Normal heart sounds. No murmur heard.  No friction rub. No gallop.   Pulmonary:     Effort: Pulmonary effort is normal. No respiratory distress.     Breath sounds: Normal breath sounds. No wheezing or rales.  Chest:     Chest wall: No tenderness.  Abdominal:     General: Bowel sounds are normal. There is no distension.     Palpations: Abdomen is soft. There is no mass.     Tenderness: There is no abdominal tenderness. There is no guarding or rebound.  Musculoskeletal:        General: No tenderness. Normal range of motion.     Cervical back: Normal range of  motion.  Lymphadenopathy:     Cervical: No cervical adenopathy.  Skin:    General: Skin is warm and dry.     Findings: No rash.  Neurological:     Mental Status: He is alert and oriented to person, place, and time.     Cranial Nerves: No cranial nerve deficit.     Motor: No abnormal muscle tone.     Coordination: Coordination normal.     Gait: Gait normal.     Deep Tendon Reflexes: Reflexes are normal and symmetric.  Psychiatric:        Behavior: Behavior normal.        Thought Content: Thought content normal.        Judgment: Judgment normal.     Lab Results  Component Value Date   WBC 8.7 02/05/2020   HGB 17.9 (H) 02/05/2020   HCT 53.0 (H) 02/05/2020   PLT 156.0 02/05/2020   GLUCOSE 125 (H) 09/05/2020   CHOL 149 02/05/2020   TRIG 127.0 02/05/2020   HDL 31.20 (L) 02/05/2020   LDLCALC 93 02/05/2020   ALT 20 02/05/2020   AST 12 02/05/2020   NA 144 09/05/2020   K 3.4 (L) 09/05/2020   CL 110 09/05/2020   CREATININE 0.73 09/05/2020   BUN 17 09/05/2020   CO2 26 09/05/2020   TSH 0.86 02/05/2020   PSA 3.59 02/05/2020   INR 1.1 (H) 01/08/2012   HGBA1C 7.3 (H) 09/05/2020    DG Chest 2 View  Result Date: 08/16/2020 CLINICAL DATA:  History of renal carcinoma, no acute symptomatology noted EXAM: CHEST - 2 VIEW COMPARISON:  07/11/2015 FINDINGS: Cardiac shadows within normal limits. Lungs are well aerated bilaterally. Mild fullness is noted in the right infrahilar region likely related to prominent vasculature and mild patient rotation to the left. This is not well appreciated on the lateral projection. No focal infiltrate is seen. No acute bony abnormality is noted. IMPRESSION: Mild prominence in the right infrahilar region likely related underlying vasculature and patient rotation as it is not well appreciated on the lateral projection. Given the patient's clinical history however a baseline CT with contrast may be helpful for confirmation. Electronically Signed   By: Inez Catalina  M.D.   On: 08/16/2020 19:31    Assessment & Plan:    Walker Kehr, MD

## 2020-09-06 NOTE — Assessment & Plan Note (Signed)
Re-start [facility]

## 2020-11-18 ENCOUNTER — Other Ambulatory Visit: Payer: Self-pay | Admitting: Internal Medicine

## 2020-11-18 DIAGNOSIS — E118 Type 2 diabetes mellitus with unspecified complications: Secondary | ICD-10-CM

## 2020-11-18 DIAGNOSIS — E785 Hyperlipidemia, unspecified: Secondary | ICD-10-CM

## 2020-11-23 DIAGNOSIS — N401 Enlarged prostate with lower urinary tract symptoms: Secondary | ICD-10-CM | POA: Diagnosis not present

## 2020-11-23 DIAGNOSIS — N210 Calculus in bladder: Secondary | ICD-10-CM | POA: Diagnosis not present

## 2020-11-23 DIAGNOSIS — R3915 Urgency of urination: Secondary | ICD-10-CM | POA: Diagnosis not present

## 2020-11-23 DIAGNOSIS — N200 Calculus of kidney: Secondary | ICD-10-CM | POA: Diagnosis not present

## 2020-11-24 ENCOUNTER — Other Ambulatory Visit: Payer: Self-pay | Admitting: Urology

## 2020-11-28 MED ORDER — OLMESARTAN MEDOXOMIL 40 MG PO TABS: 40.0000 mg | ORAL_TABLET | Freq: Every day | ORAL | 3 refills | Status: DC

## 2020-11-28 NOTE — Patient Instructions (Addendum)
DUE TO COVID-19 ONLY ONE VISITOR IS ALLOWED TO COME WITH YOU AND STAY IN THE WAITING ROOM ONLY DURING PRE OP AND PROCEDURE DAY OF SURGERY. THE 1 VISITOR  MAY VISIT WITH YOU AFTER SURGERY IN YOUR PRIVATE ROOM DURING VISITING HOURS ONLY!  YOU NEED TO HAVE A COVID 19 TEST ON___2/23____ @__2 :15_____, THIS TEST MUST BE DONE BEFORE SURGERY,  COVID TESTING SITE [address] [place] [id number], IT IS ON THE RIGHT GOING OUT [address] APPROXIMATELY  2 MINUTES PAST [employer] ON THE RIGHT. ONCE YOUR COVID TEST IS COMPLETED,  PLEASE BEGIN THE QUARANTINE INSTRUCTIONS AS OUTLINED IN YOUR HANDOUT.                [name] [name]    Your procedure is scheduled on: 12/09/20   Report to [facility] Main  Entrance   Report to admitting at  7:45 AM     Call this number if you have problems the morning of surgery [phone]    Remember: Do not eat food or drink liquids :After Midnight.   BRUSH YOUR TEETH MORNING OF SURGERY AND RINSE YOUR MOUTH OUT, NO CHEWING GUM CANDY OR MINTS.     Take these medicines the morning of surgery with A SIP OF WATER: Amlodipine, Finasteride  How to Manage Your Diabetes Before and After Surgery  Why is it important to control my blood sugar before and after surgery? . Improving blood sugar levels before and after surgery helps healing and can limit problems. . A way of improving blood sugar control is eating a healthy diet by: o  Eating less sugar and carbohydrates o  Increasing activity/exercise o  Talking with your doctor about reaching your blood sugar goals . High blood sugars (greater than 180 mg/dL) can raise your risk of infections and slow your recovery, so you will need to focus on controlling your diabetes during the weeks before surgery. . Make sure that the doctor who takes care of your diabetes knows about your planned surgery including the date and location.  How do I manage my blood sugar before surgery? . Check your  blood sugar at least 4 times a day, starting 2 days before surgery, to make sure that the level is not too high or low. o Check your blood sugar the morning of your surgery when you wake up and every 2 hours until you get to the Short Stay unit. . If your blood sugar is less than 70 mg/dL, you will need to treat for low blood sugar: o Do not take insulin. o Treat a low blood sugar (less than 70 mg/dL) with  cup of clear juice (cranberry or apple), 4 glucose tablets, OR glucose gel. o Recheck blood sugar in 15 minutes after treatment (to make sure it is greater than 70 mg/dL). If your blood sugar is not greater than 70 mg/dL on recheck, call [phone] for further instructions. . Report your blood sugar to the short stay nurse when you get to Short Stay.  . If you are admitted to the hospital after surgery: o Your blood sugar will be checked by the staff and you will probably be given insulin after surgery (instead of oral diabetes medicines) to make sure you have good blood sugar levels. o The goal for blood sugar control after surgery is 80-180 mg/dL.   WHAT DO I DO ABOUT MY DIABETES MEDICATION?  Marland Kitchen Do not take oral diabetes medicines (pills) the morning of surgery.Marland Kitchen  You may not have any metal on your body including              piercings  Do not wear jewelry,  lotions, powders or deodorant                         Men may shave face and neck.   Do not bring valuables to the hospital. [place].  Contacts, dentures or bridgework may not be worn into surgery.       Patients discharged the day of surgery will not be allowed to drive home.   IF YOU ARE HAVING SURGERY AND GOING HOME THE SAME DAY, YOU MUST HAVE AN ADULT TO DRIVE YOU HOME AND BE WITH YOU FOR 24 HOURS.   YOU MAY GO HOME BY TAXI OR UBER OR ORTHERWISE, BUT AN ADULT MUST ACCOMPANY YOU HOME AND STAY WITH YOU FOR 24 HOURS.  Name and phone  number of your driver:  Special Instructions: N/A              Please read over the following fact sheets you were given: _____________________________________________________________________             [facility] - Preparing for Surgery Before surgery, you can play an important role.  Because skin is not sterile, your skin needs to be as free of germs as possible.  You can reduce the number of germs on your skin by washing with CHG (chlorahexidine gluconate) soap before surgery.  CHG is an antiseptic cleaner which kills germs and bonds with the skin to continue killing germs even after washing. Please DO NOT use if you have an allergy to CHG or antibacterial soaps.  If your skin becomes reddened/irritated stop using the CHG and inform your nurse when you arrive at Short Stay.  You may shave your face/neck. Please follow these instructions carefully:  1.  Shower with CHG Soap the night before surgery and the  morning of Surgery.  2.  If you choose to wash your hair, wash your hair first as usual with your  normal  shampoo.  3.  After you shampoo, rinse your hair and body thoroughly to remove the  shampoo.                                        4.  Use CHG as you would any other liquid soap.  You can apply chg directly  to the skin and wash                       Gently with a scrungie or clean washcloth.  5.  Apply the CHG Soap to your body ONLY FROM THE NECK DOWN.   Do not use on face/ open                           Wound or open sores. Avoid contact with eyes, ears mouth and genitals (private parts).                       Wash face,  Genitals (private parts) with your normal soap.             6.  Wash thoroughly, paying special attention to the area where your surgery  will be performed.  7.  Thoroughly rinse your body with warm water from the neck down.  8.  DO NOT shower/wash with your normal soap after using and rinsing off  the CHG Soap.             9.  Pat yourself dry with a clean  towel.            10.  Wear clean pajamas.            11.  Place clean sheets on your bed the night of your first shower and do not  sleep with pets. Day of Surgery : Do not apply any lotions/deodorants the morning of surgery.  Please wear clean clothes to the hospital/surgery center.  FAILURE TO FOLLOW THESE INSTRUCTIONS MAY RESULT IN THE CANCELLATION OF YOUR SURGERY PATIENT SIGNATURE_________________________________  NURSE SIGNATURE__________________________________  ________________________________________________________________________

## 2020-11-29 ENCOUNTER — Encounter (HOSPITAL_COMMUNITY)
Admission: RE | Admit: 2020-11-29 | Discharge: 2020-11-29 | Disposition: A | Discharge status: Home / Self Care | Payer: BC Managed Care – PPO | Source: Ambulatory Visit | Attending: Urology | Admitting: Urology

## 2020-11-29 ENCOUNTER — Other Ambulatory Visit: Payer: Self-pay

## 2020-11-29 ENCOUNTER — Encounter (HOSPITAL_COMMUNITY): Payer: Self-pay

## 2020-11-29 DIAGNOSIS — Z01818 Encounter for other preprocedural examination: Secondary | ICD-10-CM | POA: Insufficient documentation

## 2020-11-29 DIAGNOSIS — I10 Essential (primary) hypertension: Secondary | ICD-10-CM | POA: Diagnosis not present

## 2020-11-29 HISTORY — DX: Atherosclerotic heart disease of native coronary artery without angina pectoris: I25.10

## 2020-11-29 LAB — BASIC METABOLIC PANEL
Anion gap: 9 (ref 5–15)
BUN: 18 mg/dL (ref 8–23)
CO2: 24 mmol/L (ref 22–32)
Calcium: 9.5 mg/dL (ref 8.9–10.3)
Chloride: 108 mmol/L (ref 98–111)
Creatinine, Ser: 0.74 mg/dL (ref 0.61–1.24)
GFR, Estimated: >60 mL/min (ref >60)
Glucose, Bld: 142 mg/dL — ABNORMAL HIGH (ref 70–99)
Potassium: 3.5 mmol/L (ref 3.5–5.1)
Sodium: 141 mmol/L (ref 135–145)

## 2020-11-29 LAB — CBC
HCT: 52.7 % — ABNORMAL HIGH (ref 39.0–52.0)
Hemoglobin: 17.6 g/dL — ABNORMAL HIGH (ref 13.0–17.0)
MCH: 29.2 pg (ref 26.0–34.0)
MCHC: 33.4 g/dL (ref 30.0–36.0)
MCV: 87.4 fL (ref 80.0–100.0)
Platelets: 152 10*3/uL (ref 150–400)
RBC: 6.03 MIL/uL — ABNORMAL HIGH (ref 4.22–5.81)
RDW: 13.9 % (ref 11.5–15.5)
WBC: 7.7 10*3/uL (ref 4.0–10.5)
nRBC: 0 % (ref 0.0–0.2)

## 2020-11-29 LAB — GLUCOSE, CAPILLARY: Glucose-Capillary: 145 mg/dL — ABNORMAL HIGH (ref 70–99)

## 2020-11-29 NOTE — Progress Notes (Signed)
COVID Vaccine Completed:Yes Date COVID Vaccine completed:02/15/20- booster 10/05/20 COVID vaccine manufacturer: Pfizer     PCP - Dr. Alain Marion Cardiologist - none  Chest x-ray - no EKG - 11/29/20 chart, epic Stress Test - no ECHO - no Cardiac Cath - no Pacemaker/ICD device last checked:NA  Sleep Study - no CPAP -   Fasting Blood Sugar - pt doesn't test Checks Blood Sugar _____ times a day  Blood Thinner Instructions:NA Aspirin Instructions: Last Dose:  Anesthesia review:   Patient denies shortness of breath, fever, cough and chest pain at PAT appointment  yes Patient verbalized understanding of instructions that were given to them at the PAT appointment. Patient was also instructed that they will need to review over the PAT instructions again at home before surgery.yes Pt is a smoker but reports no SOB climbing stairs, doing work around the house or with ADLs.

## 2020-11-30 LAB — HEMOGLOBIN A1C
Hgb A1c MFr Bld: 6.9 % — ABNORMAL HIGH (ref 4.8–5.6)
Mean Plasma Glucose: 151 mg/dL

## 2020-12-07 ENCOUNTER — Other Ambulatory Visit (HOSPITAL_COMMUNITY)
Admission: RE | Admit: 2020-12-07 | Discharge: 2020-12-07 | Disposition: A | Discharge status: Home / Self Care | Payer: BC Managed Care – PPO | Source: Ambulatory Visit | Attending: Urology | Admitting: Urology

## 2020-12-07 ENCOUNTER — Ambulatory Visit: Payer: BC Managed Care – PPO | Admitting: Internal Medicine

## 2020-12-07 ENCOUNTER — Encounter: Payer: Self-pay | Admitting: Internal Medicine

## 2020-12-07 ENCOUNTER — Other Ambulatory Visit: Payer: Self-pay

## 2020-12-07 ENCOUNTER — Telehealth: Payer: Self-pay | Admitting: *Deleted

## 2020-12-07 VITALS — BP 148/82 | HR 76 | Temp 98.6°F | Wt 191.6 lb

## 2020-12-07 DIAGNOSIS — H42 Glaucoma in diseases classified elsewhere: Secondary | ICD-10-CM | POA: Diagnosis not present

## 2020-12-07 DIAGNOSIS — F17200 Nicotine dependence, unspecified, uncomplicated: Secondary | ICD-10-CM | POA: Diagnosis not present

## 2020-12-07 DIAGNOSIS — N200 Calculus of kidney: Secondary | ICD-10-CM

## 2020-12-07 DIAGNOSIS — Z01812 Encounter for preprocedural laboratory examination: Secondary | ICD-10-CM | POA: Insufficient documentation

## 2020-12-07 DIAGNOSIS — Z885 Allergy status to narcotic agent status: Secondary | ICD-10-CM | POA: Diagnosis not present

## 2020-12-07 DIAGNOSIS — I10 Essential (primary) hypertension: Secondary | ICD-10-CM

## 2020-12-07 DIAGNOSIS — Z79899 Other long term (current) drug therapy: Secondary | ICD-10-CM | POA: Diagnosis not present

## 2020-12-07 DIAGNOSIS — Z905 Acquired absence of kidney: Secondary | ICD-10-CM | POA: Diagnosis not present

## 2020-12-07 DIAGNOSIS — E1139 Type 2 diabetes mellitus with other diabetic ophthalmic complication: Secondary | ICD-10-CM | POA: Diagnosis not present

## 2020-12-07 DIAGNOSIS — C675 Malignant neoplasm of bladder neck: Secondary | ICD-10-CM | POA: Diagnosis not present

## 2020-12-07 DIAGNOSIS — Z0000 Encounter for general adult medical examination without abnormal findings: Secondary | ICD-10-CM

## 2020-12-07 DIAGNOSIS — Z20822 Contact with and (suspected) exposure to covid-19: Secondary | ICD-10-CM | POA: Insufficient documentation

## 2020-12-07 DIAGNOSIS — R351 Nocturia: Secondary | ICD-10-CM | POA: Diagnosis not present

## 2020-12-07 DIAGNOSIS — E278 Other specified disorders of adrenal gland: Secondary | ICD-10-CM

## 2020-12-07 DIAGNOSIS — R3915 Urgency of urination: Secondary | ICD-10-CM | POA: Diagnosis not present

## 2020-12-07 DIAGNOSIS — N401 Enlarged prostate with lower urinary tract symptoms: Secondary | ICD-10-CM | POA: Diagnosis not present

## 2020-12-07 DIAGNOSIS — Q6102 Congenital multiple renal cysts: Secondary | ICD-10-CM | POA: Diagnosis not present

## 2020-12-07 DIAGNOSIS — E279 Disorder of adrenal gland, unspecified: Secondary | ICD-10-CM | POA: Diagnosis not present

## 2020-12-07 DIAGNOSIS — Z8051 Family history of malignant neoplasm of kidney: Secondary | ICD-10-CM | POA: Diagnosis not present

## 2020-12-07 DIAGNOSIS — Z888 Allergy status to other drugs, medicaments and biological substances status: Secondary | ICD-10-CM | POA: Diagnosis not present

## 2020-12-07 DIAGNOSIS — E785 Hyperlipidemia, unspecified: Secondary | ICD-10-CM

## 2020-12-07 DIAGNOSIS — E118 Type 2 diabetes mellitus with unspecified complications: Secondary | ICD-10-CM

## 2020-12-07 DIAGNOSIS — R82998 Other abnormal findings in urine: Secondary | ICD-10-CM

## 2020-12-07 DIAGNOSIS — C641 Malignant neoplasm of right kidney, except renal pelvis: Secondary | ICD-10-CM

## 2020-12-07 DIAGNOSIS — R350 Frequency of micturition: Secondary | ICD-10-CM | POA: Diagnosis not present

## 2020-12-07 DIAGNOSIS — N210 Calculus in bladder: Secondary | ICD-10-CM | POA: Diagnosis not present

## 2020-12-07 DIAGNOSIS — R972 Elevated prostate specific antigen [PSA]: Secondary | ICD-10-CM

## 2020-12-07 DIAGNOSIS — N3943 Post-void dribbling: Secondary | ICD-10-CM | POA: Diagnosis not present

## 2020-12-07 DIAGNOSIS — N211 Calculus in urethra: Secondary | ICD-10-CM | POA: Diagnosis not present

## 2020-12-07 DIAGNOSIS — Z800 Family history of malignant neoplasm of digestive organs: Secondary | ICD-10-CM | POA: Diagnosis not present

## 2020-12-07 DIAGNOSIS — N3081 Other cystitis with hematuria: Secondary | ICD-10-CM | POA: Diagnosis not present

## 2020-12-07 DIAGNOSIS — D494 Neoplasm of unspecified behavior of bladder: Secondary | ICD-10-CM | POA: Diagnosis not present

## 2020-12-07 DIAGNOSIS — Z85528 Personal history of other malignant neoplasm of kidney: Secondary | ICD-10-CM | POA: Diagnosis not present

## 2020-12-07 LAB — COMPREHENSIVE METABOLIC PANEL
ALT: 22 U/L (ref 0–53)
AST: 10 U/L (ref 0–37)
Albumin: 4.1 g/dL (ref 3.5–5.2)
Alkaline Phosphatase: 98 U/L (ref 39–117)
BUN: 17 mg/dL (ref 6–23)
CO2: 33 mEq/L — ABNORMAL HIGH (ref 19–32)
Calcium: 9.4 mg/dL (ref 8.4–10.5)
Chloride: 107 mEq/L (ref 96–112)
Creatinine, Ser: 0.86 mg/dL (ref 0.40–1.50)
GFR: 90.32 mL/min (ref >60.00)
Glucose, Bld: 137 mg/dL — ABNORMAL HIGH (ref 70–99)
Potassium: 3.9 mEq/L (ref 3.5–5.1)
Sodium: 144 mEq/L (ref 135–145)
Total Bilirubin: 0.4 mg/dL (ref 0.2–1.2)
Total Protein: 6.4 g/dL (ref 6.0–8.3)

## 2020-12-07 LAB — HEMOGLOBIN A1C: Hgb A1c MFr Bld: 6.9 % — ABNORMAL HIGH (ref 4.6–6.5)

## 2020-12-07 LAB — SARS CORONAVIRUS 2 (TAT 6-24 HRS): SARS Coronavirus 2: NEGATIVE

## 2020-12-07 NOTE — Assessment & Plan Note (Signed)
Cystoscopy on 12/08/20

## 2020-12-07 NOTE — Assessment & Plan Note (Signed)
Synjardy, Prandin

## 2020-12-07 NOTE — Assessment & Plan Note (Signed)
Ordered PSA

## 2020-12-07 NOTE — Assessment & Plan Note (Signed)
Pt declined statins A cardiac CT scan for calcium scoring ordered

## 2020-12-07 NOTE — Assessment & Plan Note (Signed)
Olmesartan; Norvasc

## 2020-12-07 NOTE — Assessment & Plan Note (Signed)
S/p R partial nephrectomy for renal cell cancer

## 2020-12-07 NOTE — Progress Notes (Signed)
Subjective:  Patient ID: [name] [name], male    DOB: December 26, 1955  Age: 65 y.o. MRN: [id number]  CC: Follow-up (3 month f/u)   HPI DONIVEN VANPATTEN presents for DM, cancer, HTN, CRI f/u  Outpatient Medications Prior to Visit  Medication Sig Dispense Refill  . amLODipine (NORVASC) 5 MG tablet Take 1 tablet (5 mg total) by mouth daily. 90 tablet 3  . Ascorbic Acid (VITAMIN C) 1000 MG tablet Take 1,000 mg by mouth daily as needed (immunine).    . blood glucose meter kit and supplies KIT Use to test blood sugar once a day. DX: E11.9 1 each 0  . Blood Glucose Monitoring Suppl (CONTOUR NEXT MONITOR) w/Device KIT 1 Device by Does not apply route daily. 1 kit 0  . cholecalciferol (VITAMIN D) 1000 UNITS tablet Take 1,000 Units by mouth daily.    . CONTOUR NEXT TEST test strip USE AS DIRECTED 100 strip 5  . Empagliflozin-metFORMIN HCl ER (SYNJARDY XR) 12.02-999 MG TB24 Take 1 tablet by mouth 2 (two) times daily. 180 tablet 3  . finasteride (PROSCAR) 5 MG tablet Take 1 tablet (5 mg total) by mouth daily. 100 tablet 3  . Lancets MISC Use to check blood sugar once per day. 100 each 3  . NONFORMULARY OR COMPOUNDED ITEM [facility]:  Antifungal Cream - Terbinafine 3%, Fluconazole 2%, Tea Tree Oil 5%, Urea 10%, Ibuprofen 2% in DMSO suspension #50m. Apply to the affected nail(s) once (at bedtime) or twice daily. 30 each 5  . olmesartan (BENICAR) 40 MG tablet Take 1 tablet (40 mg total) by mouth daily. 90 tablet 3  . potassium chloride SA (KLOR-CON) 20 MEQ tablet Take 1 tablet (20 mEq total) by mouth daily. 90 tablet 11  . repaglinide (PRANDIN) 2 MG tablet Take 1 tablet (2 mg total) by mouth 3 (three) times daily before meals. 270 tablet 3  . tadalafil (CIALIS) 5 MG tablet Take 5 mg by mouth daily.     No facility-administered medications prior to visit.    ROS: Review of Systems  Constitutional: Negative for appetite change, fatigue and unexpected weight change.  HENT: Negative for  congestion, nosebleeds, sneezing, sore throat and trouble swallowing.   Eyes: Negative for itching and visual disturbance.  Respiratory: Negative for cough.   Cardiovascular: Negative for chest pain, palpitations and leg swelling.  Gastrointestinal: Negative for abdominal distention, blood in stool, diarrhea and nausea.  Genitourinary: Negative for frequency and hematuria.  Musculoskeletal: Negative for back pain, gait problem, joint swelling and neck pain.  Skin: Negative for rash.  Neurological: Negative for dizziness, tremors, speech difficulty and weakness.  Psychiatric/Behavioral: Negative for agitation, dysphoric mood and sleep disturbance. The patient is not nervous/anxious.     Objective:  BP (!) 148/82 (BP Location: Left Arm)   Pulse 76   Temp 98.6 F (37 C) (Oral)   Wt 191 lb 9.6 oz (86.9 kg)   SpO2 96%   BMI 30.93 kg/m   BP Readings from Last 3 Encounters:  12/07/20 (!) 148/82  11/29/20 (!) 172/97  09/06/20 140/82    Wt Readings from Last 3 Encounters:  12/07/20 191 lb 9.6 oz (86.9 kg)  11/29/20 189 lb (85.7 kg)  09/06/20 192 lb (87.1 kg)    Physical Exam Constitutional:      General: He is not in acute distress.    Appearance: He is well-developed.     Comments: NAD  HENT:     Mouth/Throat:     Mouth: Oropharynx  is clear and moist.  Eyes:     Conjunctiva/sclera: Conjunctivae normal.     Pupils: Pupils are equal, round, and reactive to light.  Neck:     Thyroid: No thyromegaly.     Vascular: No JVD.  Cardiovascular:     Rate and Rhythm: Normal rate and regular rhythm.     Pulses: Intact distal pulses.     Heart sounds: Normal heart sounds. No murmur heard. No friction rub. No gallop.   Pulmonary:     Effort: Pulmonary effort is normal. No respiratory distress.     Breath sounds: Normal breath sounds. No wheezing or rales.  Chest:     Chest wall: No tenderness.  Abdominal:     General: Bowel sounds are normal. There is no distension.      Palpations: Abdomen is soft. There is no mass.     Tenderness: There is no abdominal tenderness. There is no guarding or rebound.  Musculoskeletal:        General: No tenderness or edema. Normal range of motion.     Cervical back: Normal range of motion.  Lymphadenopathy:     Cervical: No cervical adenopathy.  Skin:    General: Skin is warm and dry.     Findings: No rash.  Neurological:     Mental Status: He is alert and oriented to person, place, and time.     Cranial Nerves: No cranial nerve deficit.     Motor: No abnormal muscle tone.     Coordination: He displays a negative Romberg sign. Coordination normal.     Gait: Gait normal.     Deep Tendon Reflexes: Reflexes are normal and symmetric.  Psychiatric:        Mood and Affect: Mood and affect normal.        Behavior: Behavior normal.        Thought Content: Thought content normal.        Judgment: Judgment normal.     Lab Results  Component Value Date   WBC 7.7 11/29/2020   HGB 17.6 (H) 11/29/2020   HCT 52.7 (H) 11/29/2020   PLT 152 11/29/2020   GLUCOSE 137 (H) 12/07/2020   CHOL 149 02/05/2020   TRIG 127.0 02/05/2020   HDL 31.20 (L) 02/05/2020   LDLCALC 93 02/05/2020   ALT 22 12/07/2020   AST 10 12/07/2020   NA 144 12/07/2020   K 3.9 12/07/2020   CL 107 12/07/2020   CREATININE 0.86 12/07/2020   BUN 17 12/07/2020   CO2 33 (H) 12/07/2020   TSH 0.86 02/05/2020   PSA 3.59 02/05/2020   INR 1.1 (H) 01/08/2012   HGBA1C 6.9 (H) 12/07/2020    No results found.  Assessment & Plan:   Follow-up: No follow-ups on file.  Walker Kehr, MD

## 2020-12-07 NOTE — Patient Instructions (Signed)

## 2020-12-07 NOTE — Telephone Encounter (Signed)
[name] [name]! [name] [name] ([id number]) is here for labs. He said that Dr Camila Li wanted him to have labs done before his appointment today. No orders in sxs per MD ok A1c and Cmet. Place order in epic.Marland KitchenJohny Chess

## 2020-12-08 NOTE — Progress Notes (Signed)
Anesthesia Chart Review   Case: [id number] Date/Time: 12/09/20 0945   Procedure: CYSTOSCOPY WITH BIOPSY AND FULGURATION POSSIBLE STONE REMOVAL (N/A ) - REQUESTING 71 MINS   Anesthesia type: General   Pre-op diagnosis: bladder lesion and possible stone   Location: [facility] ROOM 08 / [facility]   Surgeons: Irine Seal, MD      DISCUSSION:65 y.o. current every day smoker with h/o HTN, GERD, DM II, s/p right partial nephrectomy, bladder lesion and possible stone scheduled for above procedure 12/09/2020 with Dr. Irine Seal.   Per airway note 07/28/2012, "Difficulty was unanticipated, Difficult Airway- due to anterior larynx, Difficult Airway- due to limited oral opening and Difficult Airway- due to reduced neck mobility. Future Recommendations: Recommend- induction with short-acting agent, and alternative techniques readily available Comments: Unable to visualize cords with Sabra Heck 3 or Mac 4  Glidescope used and difficult to see cords with Video."  Last seen by PCP 12/07/2020.  VS: BP (!) 172/97   Pulse 68   Temp 36.9 C (Oral)   Resp 20   Ht _0  (1.676 m)   Wt 85.7 kg   SpO2 98%   BMI 30.51 kg/m   PROVIDERS: Plotnikov, Evie Lacks, MD is PCP     LABS: Labs reviewed: Acceptable for surgery. (all labs ordered are listed, but only abnormal results are displayed)  Labs Reviewed  HEMOGLOBIN A1C - Abnormal; Notable for the following components:      Result Value   Hgb A1c MFr Bld 6.9 (*)    All other components within normal limits  BASIC METABOLIC PANEL - Abnormal; Notable for the following components:   Glucose, Bld 142 (*)    All other components within normal limits  CBC - Abnormal; Notable for the following components:   RBC 6.03 (*)    Hemoglobin 17.6 (*)    HCT 52.7 (*)    All other components within normal limits  GLUCOSE, CAPILLARY - Abnormal; Notable for the following components:   Glucose-Capillary 145 (*)    All other components within normal limits      IMAGES:   EKG: 11/29/2020 Rate 63 bpm  Normal sinus rhythm Cannot rule out Anterior infarct , age undetermined Non-specific ST-t changes Abnormal ECG No significant change since last tracing  CV:  Past Medical History:  Diagnosis Date  . Cancer ([place]) 07/28/2012   kidney  . Coronary artery disease   . Diabetes mellitus type II   . Difficult intubation 07/28/2012   hard to place breathing tube  . Elevated glucose   . Elevated PSA   . GERD (gastroesophageal reflux disease)    pt.denies 07/21/19  . Herniated cervical disc 1980s   no current pain  . History of kidney stones   . Hyperlipidemia    pt. denies  . Hypertension   . Osteoarthritis    Back  . Renal neoplasm    RT    Past Surgical History:  Procedure Laterality Date  . COLONOSCOPY N/A 03/03/2013   Procedure: COLONOSCOPY;  Surgeon: Irene Shipper, MD;  Location: [facility];  Service: Endoscopy;  Laterality: N/A;  . COLONOSCOPY    . COLONOSCOPY WITH PROPOFOL N/A 09/29/2019   Procedure: COLONOSCOPY WITH PROPOFOL;  Surgeon: Irene Shipper, MD;  [facility];  Service: Endoscopy;  Laterality: N/A;  . KIDNEY SURGERY Right 07/28/12   Had 10% removed for cancer   . POLYPECTOMY    . POLYPECTOMY  09/29/2019   Procedure: POLYPECTOMY;  Surgeon: Irene Shipper, MD;  [facility];  Service: Endoscopy;;  . TONSILLECTOMY  1965    MEDICATIONS: . amLODipine (NORVASC) 5 MG tablet  . Ascorbic Acid (VITAMIN C) 1000 MG tablet  . blood glucose meter kit and supplies KIT  . Blood Glucose Monitoring Suppl (CONTOUR NEXT MONITOR) w/Device KIT  . cholecalciferol (VITAMIN D) 1000 UNITS tablet  . CONTOUR NEXT TEST test strip  . Empagliflozin-metFORMIN HCl ER (SYNJARDY XR) 12.02-999 MG TB24  . finasteride (PROSCAR) 5 MG tablet  . Lancets MISC  . NONFORMULARY OR COMPOUNDED ITEM  . olmesartan (BENICAR) 40 MG tablet  . potassium chloride SA (KLOR-CON) 20 MEQ tablet  . repaglinide (PRANDIN) 2 MG tablet  .  tadalafil (CIALIS) 5 MG tablet   No current facility-administered medications for this encounter.     Konrad Felix, PA-C [facility] [phone]

## 2020-12-08 NOTE — Anesthesia Preprocedure Evaluation (Addendum)
Anesthesia Evaluation  Patient identified by MRN, date of birth, ID band Patient awake    Reviewed: Allergy & Precautions, H&P , NPO status , Patient's Chart, lab work & pertinent test results  History of Anesthesia Complications (+) DIFFICULT AIRWAY  Airway Mallampati: III  TM Distance: >3 FB Neck ROM: Limited   Comment: Full beard Dental  (+) Upper Dentures, Lower Dentures   Pulmonary Current Smoker,    Pulmonary exam normal breath sounds clear to auscultation       Cardiovascular hypertension, Normal cardiovascular exam Rhythm:Regular Rate:Normal     Neuro/Psych negative neurological ROS  negative psych ROS   GI/Hepatic negative GI ROS, Neg liver ROS,   Endo/Other  diabetes  Renal/GU negative Renal ROS  negative genitourinary   Musculoskeletal negative musculoskeletal ROS (+)   Abdominal   Peds negative pediatric ROS (+)  Hematology negative hematology ROS (+)   Anesthesia Other Findings   Reproductive/Obstetrics negative OB ROS                           Anesthesia Physical Anesthesia Plan  ASA: III  Anesthesia Plan: General   Post-op Pain Management:    Induction: Intravenous  PONV Risk Score and Plan: 1 and Ondansetron and Treatment may vary due to age or medical condition  Airway Management Planned: LMA  Additional Equipment:   Intra-op Plan:   Post-operative Plan: Extubation in OR  Informed Consent: I have reviewed the patients History and Physical, chart, labs and discussed the procedure including the risks, benefits and alternatives for the proposed anesthesia with the patient or authorized representative who has indicated his/her understanding and acceptance.     Dental advisory given  Plan Discussed with: CRNA and Surgeon  Anesthesia Plan Comments: (See PAT note 11/29/2020, Konrad Felix, PA-C)       Anesthesia Quick Evaluation

## 2020-12-09 ENCOUNTER — Encounter (HOSPITAL_COMMUNITY): Payer: Self-pay | Admitting: Urology

## 2020-12-09 ENCOUNTER — Ambulatory Visit (HOSPITAL_COMMUNITY)
Admission: RE | Admit: 2020-12-09 | Discharge: 2020-12-09 | Disposition: A | Discharge status: Home / Self Care | Payer: BC Managed Care – PPO | Attending: Urology | Admitting: Urology

## 2020-12-09 ENCOUNTER — Encounter (HOSPITAL_COMMUNITY)
Admission: RE | Disposition: A | Discharge status: Home / Self Care | Payer: Self-pay | Source: Home / Self Care | Attending: Urology

## 2020-12-09 ENCOUNTER — Ambulatory Visit (HOSPITAL_COMMUNITY): Payer: BC Managed Care – PPO | Admitting: Certified Registered"

## 2020-12-09 ENCOUNTER — Ambulatory Visit (HOSPITAL_COMMUNITY): Payer: BC Managed Care – PPO | Admitting: Physician Assistant

## 2020-12-09 DIAGNOSIS — R350 Frequency of micturition: Secondary | ICD-10-CM | POA: Diagnosis not present

## 2020-12-09 DIAGNOSIS — Z885 Allergy status to narcotic agent status: Secondary | ICD-10-CM | POA: Insufficient documentation

## 2020-12-09 DIAGNOSIS — C673 Malignant neoplasm of anterior wall of bladder: Secondary | ICD-10-CM | POA: Diagnosis not present

## 2020-12-09 DIAGNOSIS — R351 Nocturia: Secondary | ICD-10-CM | POA: Diagnosis not present

## 2020-12-09 DIAGNOSIS — Z9889 Other specified postprocedural states: Secondary | ICD-10-CM

## 2020-12-09 DIAGNOSIS — N210 Calculus in bladder: Secondary | ICD-10-CM | POA: Insufficient documentation

## 2020-12-09 DIAGNOSIS — K219 Gastro-esophageal reflux disease without esophagitis: Secondary | ICD-10-CM | POA: Diagnosis not present

## 2020-12-09 DIAGNOSIS — N3080 Other cystitis without hematuria: Secondary | ICD-10-CM | POA: Diagnosis not present

## 2020-12-09 DIAGNOSIS — Z20822 Contact with and (suspected) exposure to covid-19: Secondary | ICD-10-CM | POA: Insufficient documentation

## 2020-12-09 DIAGNOSIS — N3943 Post-void dribbling: Secondary | ICD-10-CM | POA: Diagnosis not present

## 2020-12-09 DIAGNOSIS — Z800 Family history of malignant neoplasm of digestive organs: Secondary | ICD-10-CM | POA: Insufficient documentation

## 2020-12-09 DIAGNOSIS — H42 Glaucoma in diseases classified elsewhere: Secondary | ICD-10-CM | POA: Insufficient documentation

## 2020-12-09 DIAGNOSIS — N320 Bladder-neck obstruction: Secondary | ICD-10-CM | POA: Diagnosis not present

## 2020-12-09 DIAGNOSIS — N3081 Other cystitis with hematuria: Secondary | ICD-10-CM | POA: Diagnosis not present

## 2020-12-09 DIAGNOSIS — Z79899 Other long term (current) drug therapy: Secondary | ICD-10-CM | POA: Insufficient documentation

## 2020-12-09 DIAGNOSIS — E785 Hyperlipidemia, unspecified: Secondary | ICD-10-CM | POA: Diagnosis not present

## 2020-12-09 DIAGNOSIS — C675 Malignant neoplasm of bladder neck: Secondary | ICD-10-CM | POA: Insufficient documentation

## 2020-12-09 DIAGNOSIS — E876 Hypokalemia: Secondary | ICD-10-CM | POA: Diagnosis not present

## 2020-12-09 DIAGNOSIS — Z8051 Family history of malignant neoplasm of kidney: Secondary | ICD-10-CM | POA: Insufficient documentation

## 2020-12-09 DIAGNOSIS — N211 Calculus in urethra: Secondary | ICD-10-CM | POA: Diagnosis not present

## 2020-12-09 DIAGNOSIS — Z888 Allergy status to other drugs, medicaments and biological substances status: Secondary | ICD-10-CM | POA: Insufficient documentation

## 2020-12-09 DIAGNOSIS — Z85528 Personal history of other malignant neoplasm of kidney: Secondary | ICD-10-CM | POA: Insufficient documentation

## 2020-12-09 DIAGNOSIS — N401 Enlarged prostate with lower urinary tract symptoms: Secondary | ICD-10-CM | POA: Insufficient documentation

## 2020-12-09 DIAGNOSIS — R3915 Urgency of urination: Secondary | ICD-10-CM | POA: Insufficient documentation

## 2020-12-09 DIAGNOSIS — E1139 Type 2 diabetes mellitus with other diabetic ophthalmic complication: Secondary | ICD-10-CM | POA: Insufficient documentation

## 2020-12-09 DIAGNOSIS — D494 Neoplasm of unspecified behavior of bladder: Secondary | ICD-10-CM | POA: Diagnosis not present

## 2020-12-09 DIAGNOSIS — Z905 Acquired absence of kidney: Secondary | ICD-10-CM | POA: Insufficient documentation

## 2020-12-09 DIAGNOSIS — N3289 Other specified disorders of bladder: Secondary | ICD-10-CM | POA: Diagnosis not present

## 2020-12-09 DIAGNOSIS — E279 Disorder of adrenal gland, unspecified: Secondary | ICD-10-CM | POA: Insufficient documentation

## 2020-12-09 DIAGNOSIS — Q6102 Congenital multiple renal cysts: Secondary | ICD-10-CM | POA: Diagnosis not present

## 2020-12-09 DIAGNOSIS — F17200 Nicotine dependence, unspecified, uncomplicated: Secondary | ICD-10-CM | POA: Insufficient documentation

## 2020-12-09 HISTORY — PX: CYSTOSCOPY WITH BIOPSY: SHX5122

## 2020-12-09 LAB — GLUCOSE, CAPILLARY
Glucose-Capillary: 135 mg/dL — ABNORMAL HIGH (ref 70–99)
Glucose-Capillary: 111 mg/dL — ABNORMAL HIGH (ref 70–99)

## 2020-12-09 SURGERY — CYSTOSCOPY, WITH BIOPSY
Anesthesia: General

## 2020-12-09 MED ORDER — SODIUM CHLORIDE 0.9% FLUSH: 3.0000 mL | Freq: Two times a day (BID) | INTRAVENOUS | Status: DC

## 2020-12-09 MED ORDER — FENTANYL CITRATE (PF) 100 MCG/2ML IJ SOLN
INTRAMUSCULAR | Status: DC | PRN
  Administered 2020-12-09: 50 ug via INTRAVENOUS

## 2020-12-09 MED ORDER — MIDAZOLAM HCL 2 MG/2ML IJ SOLN
INTRAMUSCULAR | Status: DC | PRN
  Administered 2020-12-09: 2 mg via INTRAVENOUS

## 2020-12-09 MED ORDER — SODIUM CHLORIDE 0.9% FLUSH: 3.0000 mL | INTRAVENOUS | Status: DC | PRN

## 2020-12-09 MED ORDER — MIDAZOLAM HCL 2 MG/2ML IJ SOLN
INTRAMUSCULAR | Status: AC
  Filled 2020-12-09: qty 2

## 2020-12-09 MED ORDER — SODIUM CHLORIDE 0.9 % IR SOLN
Status: DC | PRN
  Administered 2020-12-09: 3000 mL

## 2020-12-09 MED ORDER — SODIUM CHLORIDE 0.9 % IV SOLN: 250.0000 mL | INTRAVENOUS | Status: DC | PRN

## 2020-12-09 MED ORDER — HYDROCODONE-ACETAMINOPHEN 5-325 MG PO TABS: 1.0000 | ORAL_TABLET | Freq: Four times a day (QID) | ORAL | 0 refills | Status: DC | PRN

## 2020-12-09 MED ORDER — DEXAMETHASONE SODIUM PHOSPHATE 10 MG/ML IJ SOLN
INTRAMUSCULAR | Status: AC
  Filled 2020-12-09: qty 1

## 2020-12-09 MED ORDER — PROPOFOL 10 MG/ML IV BOLUS
INTRAVENOUS | Status: DC | PRN
  Administered 2020-12-09: 150 mg via INTRAVENOUS
  Administered 2020-12-09: 50 mg via INTRAVENOUS

## 2020-12-09 MED ORDER — FENTANYL CITRATE (PF) 100 MCG/2ML IJ SOLN: 25.0000 ug | INTRAMUSCULAR | Status: DC | PRN

## 2020-12-09 MED ORDER — ACETAMINOPHEN 10 MG/ML IV SOLN: 1000.0000 mg | Freq: Once | INTRAVENOUS | Status: DC | PRN

## 2020-12-09 MED ORDER — LIDOCAINE 2% (20 MG/ML) 5 ML SYRINGE
INTRAMUSCULAR | Status: DC | PRN
  Administered 2020-12-09: 100 mg via INTRAVENOUS

## 2020-12-09 MED ORDER — LIDOCAINE HCL (PF) 2 % IJ SOLN
INTRAMUSCULAR | Status: AC
  Filled 2020-12-09: qty 5

## 2020-12-09 MED ORDER — ONDANSETRON HCL 4 MG/2ML IJ SOLN
INTRAMUSCULAR | Status: AC
  Filled 2020-12-09: qty 2

## 2020-12-09 MED ORDER — DEXAMETHASONE SODIUM PHOSPHATE 10 MG/ML IJ SOLN
INTRAMUSCULAR | Status: DC | PRN
  Administered 2020-12-09: 4 mg via INTRAVENOUS

## 2020-12-09 MED ORDER — FENTANYL CITRATE (PF) 100 MCG/2ML IJ SOLN
INTRAMUSCULAR | Status: AC
  Filled 2020-12-09: qty 2

## 2020-12-09 MED ORDER — ORAL CARE MOUTH RINSE: 15.0000 mL | Freq: Once | OROMUCOSAL | Status: AC

## 2020-12-09 MED ORDER — CEFAZOLIN SODIUM-DEXTROSE 2-4 GM/100ML-% IV SOLN
2.0000 g | INTRAVENOUS | Status: AC
  Administered 2020-12-09: 2 g via INTRAVENOUS
  Filled 2020-12-09: qty 100

## 2020-12-09 MED ORDER — SODIUM CHLORIDE 0.9 % IR SOLN
Status: DC | PRN
  Administered 2020-12-09: 1000 mL

## 2020-12-09 MED ORDER — LACTATED RINGERS IV SOLN: INTRAVENOUS | Status: DC

## 2020-12-09 MED ORDER — OXYCODONE HCL 5 MG PO TABS: 5.0000 mg | ORAL_TABLET | ORAL | Status: DC | PRN

## 2020-12-09 MED ORDER — PROPOFOL 10 MG/ML IV BOLUS
INTRAVENOUS | Status: AC
  Filled 2020-12-09: qty 20

## 2020-12-09 MED ORDER — ONDANSETRON HCL 4 MG/2ML IJ SOLN
INTRAMUSCULAR | Status: DC | PRN
  Administered 2020-12-09: 4 mg via INTRAVENOUS

## 2020-12-09 MED ORDER — STERILE WATER FOR IRRIGATION IR SOLN
Status: DC | PRN
  Administered 2020-12-09: 3000 mL

## 2020-12-09 MED ORDER — CHLORHEXIDINE GLUCONATE 0.12 % MT SOLN
15.0000 mL | Freq: Once | OROMUCOSAL | Status: AC
  Administered 2020-12-09: 15 mL via OROMUCOSAL

## 2020-12-09 SURGICAL SUPPLY — 19 items
BAG URINE DRAIN 2000ML AR STRL (UROLOGICAL SUPPLIES) ×2 IMPLANT
BAG URO CATCHER STRL LF (MISCELLANEOUS) ×2 IMPLANT
CATH FOLEY 2WAY SLVR  5CC 18FR (CATHETERS) ×2
CATH FOLEY 2WAY SLVR 5CC 18FR (CATHETERS) ×1 IMPLANT
CATH FOLEY 3WAY 30CC 22FR (CATHETERS) IMPLANT
CATH URET 5FR 28IN OPEN ENDED (CATHETERS) IMPLANT
GLOVE SURG POLYISO LF SZ8 (GLOVE) IMPLANT
GOWN STRL REUS W/TWL XL LVL3 (GOWN DISPOSABLE) ×2 IMPLANT
HOLDER FOLEY CATH W/STRAP (MISCELLANEOUS) ×2 IMPLANT
KIT TURNOVER KIT A (KITS) ×2 IMPLANT
LOOP CUT BIPOLAR 24F LRG (ELECTROSURGICAL) ×2 IMPLANT
MANIFOLD NEPTUNE II (INSTRUMENTS) ×2 IMPLANT
PACK CYSTO (CUSTOM PROCEDURE TRAY) ×2 IMPLANT
SUT ETHILON 3 0 PS 1 (SUTURE) IMPLANT
SYR 30ML LL (SYRINGE) ×2 IMPLANT
SYR TOOMEY IRRIG 70ML (MISCELLANEOUS) ×2
SYRINGE TOOMEY IRRIG 70ML (MISCELLANEOUS) ×1 IMPLANT
TUBING CONNECTING 10 (TUBING) ×2 IMPLANT
TUBING UROLOGY SET (TUBING) IMPLANT

## 2020-12-09 NOTE — Transfer of Care (Signed)
Immediate Anesthesia Transfer of Care Note  Patient: [name] [name]  Procedure(s) Performed: CYSTOSCOPY WITH TURBT, BIOPSY AND FULGURATION (N/A )  Patient Location: PACU  Anesthesia Type:General  Level of Consciousness: awake, alert  and oriented  Airway & Oxygen Therapy: Patient Spontanous Breathing and Patient connected to face mask oxygen  Post-op Assessment: Report given to RN, Post -op Vital signs reviewed and stable and Patient moving all extremities X 4  Post vital signs: Reviewed and stable  Last Vitals:  Vitals Value Taken Time  BP    Temp    Pulse 71 12/09/20 1120  Resp 19 12/09/20 1120  SpO2 100 % 12/09/20 1120  Vitals shown include unvalidated device data.  Last Pain:  Vitals:   12/09/20 0810  TempSrc: Oral         Complications: No complications documented.

## 2020-12-09 NOTE — Discharge Instructions (Addendum)
Transurethral Resection of Bladder Tumor, Care After This sheet gives you information about how to care for yourself after your procedure. Your health care provider may also give you more specific instructions. If you have problems or questions, contact your health care provider. What can I expect after the procedure? After the procedure, it is common to have:  A small amount of blood in your urine for up to 2 weeks.  Soreness or mild pain from your catheter. After your catheter is removed, you may have mild soreness, especially when urinating.  Pain in your lower abdomen. Follow these instructions at home: Medicines  Take over-the-counter and prescription medicines only as told by your health care provider.  If you were prescribed an antibiotic medicine, take it as told by your health care provider. Do not stop taking the antibiotic even if you start to feel better.  Do not drive for 24 hours if you were given a sedative during your procedure.  Ask your health care provider if the medicine prescribed to you: ? Requires you to avoid driving or using heavy machinery. ? Can cause constipation. You may need to take these actions to prevent or treat constipation:  Take over-the-counter or prescription medicines.  Eat foods that are high in fiber, such as beans, whole grains, and fresh fruits and vegetables.  Limit foods that are high in fat and processed sugars, such as fried or sweet foods.   Activity  Return to your normal activities as told by your health care provider. Ask your health care provider what activities are safe for you.  Do not lift anything that is heavier than 10 lb (4.5 kg), or the limit that you are told, until your health care provider says that it is safe.  Avoid intense physical activity for as long as told by your health care provider.  Rest as told by your health care provider.  Avoid sitting for a long time without moving. Get up to take short walks every  1-2 hours. This is important to improve blood flow and breathing. Ask for help if you feel weak or unsteady. General instructions  Do not drink alcohol for as long as told by your health care provider. This is especially important if you are taking prescription pain medicines.  Do not take baths, swim, or use a hot tub until your health care provider approves. Ask your health care provider if you may take showers. You may only be allowed to take sponge baths.  If you have a catheter, follow instructions from your health care provider about caring for your catheter and your drainage bag.  Drink enough fluid to keep your urine pale yellow.  Wear compression stockings as told by your health care provider. These stockings help to prevent blood clots and reduce swelling in your legs.  Keep all follow-up visits as told by your health care provider. This is important. ? You will need to be followed closely with regular checks of your bladder and urethra (cystoscopies) to make sure that the cancer does not come back.   Contact a health care provider if:  You have pain that gets worse or does not improve with medicine.  You have blood in your urine for more than 2 weeks.  You have cloudy or bad-smelling urine.  You become constipated. Signs of constipation may include having: ? Fewer than three bowel movements in a week. ? Difficulty having a bowel movement. ? Stools that are dry, hard, or larger than normal.  You have a fever. Get help right away if:  You have: ? Severe pain. ? Bright red blood in your urine. ? Blood clots in your urine. ? A lot of blood in your urine.  Your catheter has been removed and you are not able to urinate.  You have a catheter in place and the catheter is not draining urine. Summary  After your procedure, it is common to have a small amount of blood in your urine, soreness or mild pain from your catheter, and pain in your lower abdomen.  Take  over-the-counter and prescription medicines only as told by your health care provider.  Rest as told by your health care provider. Follow your health care provider's instructions about returning to normal activities. Ask what activities are safe for you.  If you have a catheter, follow instructions from your health care provider about caring for your catheter and your drainage bag.  Get help right away if you cannot urinate, you have severe pain, or you have bright red blood or blood clots in your urine. This information is not intended to replace advice given to you by your health care provider. Make sure you discuss any questions you have with your health care provider.  You may remove the catheter in the morning.  The nursing staff should provide you with instructions for that.    Document Revised: 05/01/2018 Document Reviewed: 05/01/2018 Elsevier Patient Education  [place].

## 2020-12-09 NOTE — Anesthesia Procedure Notes (Signed)
Procedure Name: LMA Insertion Date/Time: 12/09/2020 10:27 AM Performed by: Niel Hummer, CRNA Pre-anesthesia Checklist: Patient identified, Emergency Drugs available, Suction available and Patient being monitored Patient Re-evaluated:Patient Re-evaluated prior to induction Oxygen Delivery Method: Circle system utilized Preoxygenation: Pre-oxygenation with 100% oxygen Induction Type: IV induction LMA: LMA inserted LMA Size: 4.0 Number of attempts: 1 Dental Injury: Teeth and Oropharynx as per pre-operative assessment

## 2020-12-09 NOTE — H&P (View-Only) (Signed)
2/9/22[name] returns today in f/u to complete the evaluation of his hematuria and urgency. A CT demonstrated bilateral small renal stones and a possible small bladder stone. There are renal cysts as well. No renal neoplasms are seen. He has stable adrenal adenomas. His UA has 3+ glucose but no blood.   He had a CXR as well that showed a possible hilar abnormality and a CT was recommended. He has spoken to Dr. Alain Marion who suggested a coronary calcium scoring CT which would be lower cost and should assess the area of concern.    CLINICAL DATA: Patient with gross hematuria.   EXAM:  CT ABDOMEN AND PELVIS WITHOUT AND WITH CONTRAST   TECHNIQUE:  Multidetector CT imaging of the abdomen and pelvis was performed  following the standard protocol before and following the bolus  administration of intravenous contrast.   CONTRAST: 125 cc Omnipaque 300   COMPARISON: CT abdomen pelvis 12/27/2014; CT abdomen pelvis  02/17/2014   FINDINGS:  Lower chest: Normal heart size. Atelectasis/scarring within the  lower lobes bilaterally. No pleural effusion.   Hepatobiliary: The liver is normal in size and contour. Stable  subcentimeter low-attenuation lesions right and left hepatic lobe  most compatible with cysts. Gallbladder is unremarkable. No  intrahepatic or extrahepatic biliary ductal dilatation.   Pancreas: Unremarkable   Spleen: Unremarkable   Adrenals/Urinary Tract: Similar bilateral adrenal lesions post  compatible with adenomas. Noncontrast images demonstrate bilateral  nephrolithiasis. Multiple 2-3 mm punctate stones are demonstrated  bilaterally. No ureterolithiasis. No hydronephrosis. Similar  bilateral renal cysts. Kidneys enhance symmetrically with contrast.  Suggestion of a punctate 1-2 mm stone within the urinary bladder  (image 73; series 604). Delayed images demonstrate excreted contrast  material into the bilateral renal collecting systems and ureters. No  filling defects  identified within the opacified portions.   Stomach/Bowel: Sigmoid colonic diverticulosis. No CT evidence for  acute diverticulitis. No abnormal bowel wall thickening or evidence  for bowel obstruction. No free fluid or free intraperitoneal air.  Normal morphology of the stomach.   Vascular/Lymphatic: Normal caliber abdominal aorta. Peripheral  calcified atherosclerotic plaque. No retroperitoneal  lymphadenopathy.   Reproductive: Heterogeneous prostate.   Other: Small bilateral fat containing inguinal hernias.   Musculoskeletal: Lower thoracic and lumbar spine degenerative  changes. No aggressive or acute appearing osseous lesions. Stable  probable bone island within the left hemi sacrum.   IMPRESSION:  Bilateral nephrolithiasis. No ureterolithiasis. No hydronephrosis.  No suspicious enhancing renal masses. No filling defects identified  within the opacified renal collecting systems and ureters.   Suggestion of a 2 mm calcification within the urinary bladder,  potentially representing a recently passed stone.    Electronically Signed  By: Lovey Newcomer M.D.  On: 08/15/2020 14:51   08/15/20: [name] [name] returns today at the request of Dr. Alain Marion. He had some hematuria about 2 months ago and may have passed a stone. His PSA has been rising and was 3.59 in 4/21. It wa s2.92 in 9/17 and 2.31 in 8/12. He has a history of a right renal cell CA treated with a PN in 2014. His last CXR was in 2016 and was clear. He has some frequency and nocturia 1-2x with urgency that can be assocated with UUI. He has some Post void dribbling. He is a diabetic and has 2+ blood in the urine today. He has ED. He can get an erection but he can't maintain it. He hasn't tried any meds. He doesn't like tamsulosin because of ejaculatory dysfunction. he  had a normal Cr in 8/21.   GU hx:   S/P ESWL. S/P 14X 7 mm (L) renal pelvic stone ESWL 01/10/15.    Stone analysis: calcium oxalate 80/%/carbonate apatite 10%/uric acid  10%    Hx of right renal cell carcinoma. He had a right PN on 07/28/13. Last CXR 2014.     CC: AUA Questions Scoring.  HPI: [name] [name] is a 65 year-old male established patient who is here for evaluation.      AUA Symptom Score: Less than 50% of the time he has the sensation of not emptying his bladder completely when finished urinating. Less than 20% of the time he has to urinate again fewer than two hours after he has finished urinating. He does not have to stop and start again several times when he urinates. More than 50% of the time he finds it difficult to postpone urination. Less than 50% of the time he has a weak urinary stream. He never has to push or strain to begin urination. He has to get up to urinate 2 times from the time he goes to bed until the time he gets up in the morning.   Calculated AUA Symptom Score: 11    ALLERGIES: Codeine Derivatives Flomax CAPS Ondansetron HCl TABS Vicodin TABS    MEDICATIONS: Amlodipine Besilate  Inkovonmet  Repeglinide     GU PSH: ESWL - 2016, 2013 Locm 300-399Mg /Ml Iodine,1Ml - 08/15/2020 Partial nephrectomy (laparoscopic) - 2013       PSH Notes: Lithotripsy, Kidney Surgery Laparoscopic Partial Nephrectomy, Lithotripsy   NON-GU PSH: None   GU PMH: History of kidney cancer - 08/15/2020, I will get a CXR for his history of renal cell CA. , - 08/03/2020 BPH w/LUTS, His PSA has been rising and I will repeat that today. - 08/03/2020, Benign prostatic hyperplasia with urinary obstruction, - 2015 ED due to arterial insufficiency, He has progressive ED and LUTS. I discussed options and will try him on tadalafil 5mg  daily. He is a daily MJ user so he could have low T. I will check his level.. - 08/03/2020 History of urolithiasis, He had gross hematuria and may have passed a stone a couple of months ago. I will get him set up for a CT Hematuria study to assess the cause of the hematuria. - 08/03/2020, History of renal calculi, -  2016 Urinary Frequency - 08/03/2020 Urinary Urgency - 08/03/2020 Kidney Cancer Unspec, except renal pelvis, Renal cell carcinoma, unspecified laterality - 2016 Other microscopic hematuria, Microscopic hematuria - 2016 Adrenal mass Unspec, Adrenal cortical adenoma, unspecified laterality - 2015      PMH Notes: cancer kidney   NON-GU PMH: Encounter for general adult medical examination without abnormal findings, Encounter for preventive health examination - 2016 Cervical disc disorder, unspecified, unspecified cervical region, Cervical Disc Degeneration - 2014 Personal history of other diseases of the circulatory system, History of hypertension - 2014 Personal history of other diseases of the nervous system and sense organs, History of glaucoma - 2014 Personal history of other endocrine, nutritional and metabolic disease, History of diabetes mellitus - 2014 Hypertension    FAMILY HISTORY: 1 Daughter - Other 1 son - Other Colon Cancer - Mother Kidney Cancer - No Family History   SOCIAL HISTORY: Marital Status: Married Current Smoking Status: Patient smokes. Smokes less than 1/2 pack per day.   Tobacco Use Assessment Completed: Used Tobacco in last 30 days? Has never drank.  Drinks 4+ caffeinated drinks per day.     Notes:  Current every day smoker, Using Marijuana, Tobacco Use, Marital History - Currently Married, Alcohol Use, Occupation:   REVIEW OF SYSTEMS:    GU Review Male:   Patient denies frequent urination, hard to postpone urination, burning/ pain with urination, get up at night to urinate, leakage of urine, stream starts and stops, trouble starting your stream, have to strain to urinate , erection problems, and penile pain.  Gastrointestinal (Upper):   Patient denies nausea, vomiting, and indigestion/ heartburn.  Gastrointestinal (Lower):   Patient denies diarrhea and constipation.  Constitutional:   Patient denies fever, night sweats, weight loss, and fatigue.  Skin:    Patient denies skin rash/ lesion and itching.  Eyes:   Patient denies double vision and blurred vision.  Ears/ Nose/ Throat:   Patient denies sore throat and sinus problems.  Hematologic/Lymphatic:   Patient denies swollen glands and easy bruising.  Cardiovascular:   Patient denies leg swelling and chest pains.  Respiratory:   Patient denies cough and shortness of breath.  Endocrine:   Patient denies excessive thirst.  Musculoskeletal:   Patient denies back pain and joint pain.  Neurological:   Patient denies headaches and dizziness.  Psychologic:   Patient denies depression and anxiety.   VITAL SIGNS:      11/23/2020 03:50 PM  Weight 190 lb / 86.18 kg  Height 66 in / 167.64 cm  BP 175/103 mmHg  Pulse 78 /min  Temperature 96.9 F / 36.0 C  BMI 30.7 kg/m   Complexity of Data:  Records Review:   AUA Symptom Score, Previous Patient Records  Urine Test Review:   Urinalysis  X-Ray Review: Chest X-Ray Outside.: Reviewed Report. Discussed With Patient.  C.T. Hematuria: Reviewed Films. Reviewed Report. Discussed With Patient.     08/03/20 08/12/07  PSA  Total PSA 3.78 ng/mL 2.26     PROCEDURES:         Flexible Cystoscopy - 52000  Risks, benefits, and some of the potential complications of the procedure were discussed. 40ml of 2% lidocaine jelly was instilled intraurethrally.  Cipro 500mg  given for antibiotic prophylaxis.     Meatus:  Normal size. Normal location. Normal condition.  Urethra:  No strictures.  External Sphincter:  Normal.  Verumontanum:  Normal.  Prostate:  Obstructing. Enlarged median lobe. Moderate hyperplasia. there was a 59mm stone in the prostatic urethra that flushed back into the bladder.   Bladder Neck:  Non-obstructing.  Ureteral Orifices:  Normal location. Normal size. Normal shape. Effluxed clear urine.  Bladder:  Mild trabeculation. Erythematous mucosa of the bladder neck area anteriorly that is suspicious for CIS vs inflammation. No tumors. No stones.       The procedure was well tolerated and there were no complications.         Urinalysis Dipstick Dipstick Cont'd  Color: Yellow Bilirubin: Neg mg/dL  Appearance: Clear Ketones: Neg mg/dL  Specific Gravity: 1.020 Blood: Neg ery/uL  pH: 7.0 Protein: Neg mg/dL  Glucose: 3+ mg/dL Urobilinogen: 0.2 mg/dL    Nitrites: Neg    Leukocyte Esterase: Neg leu/uL    ASSESSMENT:      ICD-10 Details  1 GU:   BPH w/LUTS - N40.1 Chronic, Stable - He has persistent urgency with BPH and BOO on cystoscopy but there is worrisome asymmetric erythema of the bladder neck that is worrisome for CIS but he did have a small stone in the prostatic urethra that was in the bladder on CT and could have caused inflammatory changes. I will send a  cytology and have him scheduled for a cystoscopy with bladder biopsy. The risks of bleeding, infection, injury to the urinary tract, thrombotic events and anesthetic complications reviewed.   2   Urinary Urgency - R39.15 Chronic, Stable  3   Renal calculus - N20.0 Undiagnosed New Problem - He has small bilateral renal stones and cysts.   4   Bladder Stone - N21.0 Undiagnosed New Problem - I will remove this if still present at cystoscopy.  5   Adrenal mass Unspec - D35.00 Chronic, Stable - He has stable adrenal adenomas.     PLAN:           Orders Labs Urine Cytology:  Was negative.          Schedule Return Visit/Planned Activity: Next Available Appointment - Schedule Surgery             Note: cystoscopy with bladder biopsy.

## 2020-12-09 NOTE — H&P (Signed)
2/9/22[name] returns today in f/u to complete the evaluation of his hematuria and urgency. A CT demonstrated bilateral small renal stones and a possible small bladder stone. There are renal cysts as well. No renal neoplasms are seen. He has stable adrenal adenomas. His UA has 3+ glucose but no blood.   He had a CXR as well that showed a possible hilar abnormality and a CT was recommended. He has spoken to Dr. Alain Marion who suggested a coronary calcium scoring CT which would be lower cost and should assess the area of concern.    CLINICAL DATA: Patient with gross hematuria.   EXAM:  CT ABDOMEN AND PELVIS WITHOUT AND WITH CONTRAST   TECHNIQUE:  Multidetector CT imaging of the abdomen and pelvis was performed  following the standard protocol before and following the bolus  administration of intravenous contrast.   CONTRAST: 125 cc Omnipaque 300   COMPARISON: CT abdomen pelvis 12/27/2014; CT abdomen pelvis  02/17/2014   FINDINGS:  Lower chest: Normal heart size. Atelectasis/scarring within the  lower lobes bilaterally. No pleural effusion.   Hepatobiliary: The liver is normal in size and contour. Stable  subcentimeter low-attenuation lesions right and left hepatic lobe  most compatible with cysts. Gallbladder is unremarkable. No  intrahepatic or extrahepatic biliary ductal dilatation.   Pancreas: Unremarkable   Spleen: Unremarkable   Adrenals/Urinary Tract: Similar bilateral adrenal lesions post  compatible with adenomas. Noncontrast images demonstrate bilateral  nephrolithiasis. Multiple 2-3 mm punctate stones are demonstrated  bilaterally. No ureterolithiasis. No hydronephrosis. Similar  bilateral renal cysts. Kidneys enhance symmetrically with contrast.  Suggestion of a punctate 1-2 mm stone within the urinary bladder  (image 73; series 604). Delayed images demonstrate excreted contrast  material into the bilateral renal collecting systems and ureters. No  filling defects  identified within the opacified portions.   Stomach/Bowel: Sigmoid colonic diverticulosis. No CT evidence for  acute diverticulitis. No abnormal bowel wall thickening or evidence  for bowel obstruction. No free fluid or free intraperitoneal air.  Normal morphology of the stomach.   Vascular/Lymphatic: Normal caliber abdominal aorta. Peripheral  calcified atherosclerotic plaque. No retroperitoneal  lymphadenopathy.   Reproductive: Heterogeneous prostate.   Other: Small bilateral fat containing inguinal hernias.   Musculoskeletal: Lower thoracic and lumbar spine degenerative  changes. No aggressive or acute appearing osseous lesions. Stable  probable bone island within the left hemi sacrum.   IMPRESSION:  Bilateral nephrolithiasis. No ureterolithiasis. No hydronephrosis.  No suspicious enhancing renal masses. No filling defects identified  within the opacified renal collecting systems and ureters.   Suggestion of a 2 mm calcification within the urinary bladder,  potentially representing a recently passed stone.    Electronically Signed  By: Lovey Newcomer M.D.  On: 08/15/2020 14:51   08/15/20: [name] [name] returns today at the request of Dr. Alain Marion. He had some hematuria about 2 months ago and may have passed a stone. His PSA has been rising and was 3.59 in 4/21. It wa s2.92 in 9/17 and 2.31 in 8/12. He has a history of a right renal cell CA treated with a PN in 2014. His last CXR was in 2016 and was clear. He has some frequency and nocturia 1-2x with urgency that can be assocated with UUI. He has some Post void dribbling. He is a diabetic and has 2+ blood in the urine today. He has ED. He can get an erection but he can't maintain it. He hasn't tried any meds. He doesn't like tamsulosin because of ejaculatory dysfunction. he  had a normal Cr in 8/21.   GU hx:   S/P ESWL. S/P 14X 7 mm (L) renal pelvic stone ESWL 01/10/15.    Stone analysis: calcium oxalate 80/%/carbonate apatite 10%/uric acid  10%    Hx of right renal cell carcinoma. He had a right PN on 07/28/13. Last CXR 2014.     CC: AUA Questions Scoring.  HPI: [name] [name] is a 65 year-old male established patient who is here for evaluation.      AUA Symptom Score: Less than 50% of the time he has the sensation of not emptying his bladder completely when finished urinating. Less than 20% of the time he has to urinate again fewer than two hours after he has finished urinating. He does not have to stop and start again several times when he urinates. More than 50% of the time he finds it difficult to postpone urination. Less than 50% of the time he has a weak urinary stream. He never has to push or strain to begin urination. He has to get up to urinate 2 times from the time he goes to bed until the time he gets up in the morning.   Calculated AUA Symptom Score: 11    ALLERGIES: Codeine Derivatives Flomax CAPS Ondansetron HCl TABS Vicodin TABS    MEDICATIONS: Amlodipine Besilate  Inkovonmet  Repeglinide     GU PSH: ESWL - 2016, 2013 Locm 300-399Mg /Ml Iodine,1Ml - 08/15/2020 Partial nephrectomy (laparoscopic) - 2013       PSH Notes: Lithotripsy, Kidney Surgery Laparoscopic Partial Nephrectomy, Lithotripsy   NON-GU PSH: None   GU PMH: History of kidney cancer - 08/15/2020, I will get a CXR for his history of renal cell CA. , - 08/03/2020 BPH w/LUTS, His PSA has been rising and I will repeat that today. - 08/03/2020, Benign prostatic hyperplasia with urinary obstruction, - 2015 ED due to arterial insufficiency, He has progressive ED and LUTS. I discussed options and will try him on tadalafil 5mg  daily. He is a daily MJ user so he could have low T. I will check his level.. - 08/03/2020 History of urolithiasis, He had gross hematuria and may have passed a stone a couple of months ago. I will get him set up for a CT Hematuria study to assess the cause of the hematuria. - 08/03/2020, History of renal calculi, -  2016 Urinary Frequency - 08/03/2020 Urinary Urgency - 08/03/2020 Kidney Cancer Unspec, except renal pelvis, Renal cell carcinoma, unspecified laterality - 2016 Other microscopic hematuria, Microscopic hematuria - 2016 Adrenal mass Unspec, Adrenal cortical adenoma, unspecified laterality - 2015      PMH Notes: cancer kidney   NON-GU PMH: Encounter for general adult medical examination without abnormal findings, Encounter for preventive health examination - 2016 Cervical disc disorder, unspecified, unspecified cervical region, Cervical Disc Degeneration - 2014 Personal history of other diseases of the circulatory system, History of hypertension - 2014 Personal history of other diseases of the nervous system and sense organs, History of glaucoma - 2014 Personal history of other endocrine, nutritional and metabolic disease, History of diabetes mellitus - 2014 Hypertension    FAMILY HISTORY: 1 Daughter - Other 1 son - Other Colon Cancer - Mother Kidney Cancer - No Family History   SOCIAL HISTORY: Marital Status: Married Current Smoking Status: Patient smokes. Smokes less than 1/2 pack per day.   Tobacco Use Assessment Completed: Used Tobacco in last 30 days? Has never drank.  Drinks 4+ caffeinated drinks per day.     Notes:  Current every day smoker, Using Marijuana, Tobacco Use, Marital History - Currently Married, Alcohol Use, Occupation:   REVIEW OF SYSTEMS:    GU Review Male:   Patient denies frequent urination, hard to postpone urination, burning/ pain with urination, get up at night to urinate, leakage of urine, stream starts and stops, trouble starting your stream, have to strain to urinate , erection problems, and penile pain.  Gastrointestinal (Upper):   Patient denies nausea, vomiting, and indigestion/ heartburn.  Gastrointestinal (Lower):   Patient denies diarrhea and constipation.  Constitutional:   Patient denies fever, night sweats, weight loss, and fatigue.  Skin:    Patient denies skin rash/ lesion and itching.  Eyes:   Patient denies double vision and blurred vision.  Ears/ Nose/ Throat:   Patient denies sore throat and sinus problems.  Hematologic/Lymphatic:   Patient denies swollen glands and easy bruising.  Cardiovascular:   Patient denies leg swelling and chest pains.  Respiratory:   Patient denies cough and shortness of breath.  Endocrine:   Patient denies excessive thirst.  Musculoskeletal:   Patient denies back pain and joint pain.  Neurological:   Patient denies headaches and dizziness.  Psychologic:   Patient denies depression and anxiety.   VITAL SIGNS:      11/23/2020 03:50 PM  Weight 190 lb / 86.18 kg  Height 66 in / 167.64 cm  BP 175/103 mmHg  Pulse 78 /min  Temperature 96.9 F / 36.0 C  BMI 30.7 kg/m   Complexity of Data:  Records Review:   AUA Symptom Score, Previous Patient Records  Urine Test Review:   Urinalysis  X-Ray Review: Chest X-Ray Outside.: Reviewed Report. Discussed With Patient.  C.T. Hematuria: Reviewed Films. Reviewed Report. Discussed With Patient.     08/03/20 08/12/07  PSA  Total PSA 3.78 ng/mL 2.26     PROCEDURES:         Flexible Cystoscopy - 52000  Risks, benefits, and some of the potential complications of the procedure were discussed. 34ml of 2% lidocaine jelly was instilled intraurethrally.  Cipro 500mg  given for antibiotic prophylaxis.     Meatus:  Normal size. Normal location. Normal condition.  Urethra:  No strictures.  External Sphincter:  Normal.  Verumontanum:  Normal.  Prostate:  Obstructing. Enlarged median lobe. Moderate hyperplasia. there was a 7mm stone in the prostatic urethra that flushed back into the bladder.   Bladder Neck:  Non-obstructing.  Ureteral Orifices:  Normal location. Normal size. Normal shape. Effluxed clear urine.  Bladder:  Mild trabeculation. Erythematous mucosa of the bladder neck area anteriorly that is suspicious for CIS vs inflammation. No tumors. No stones.       The procedure was well tolerated and there were no complications.         Urinalysis Dipstick Dipstick Cont'd  Color: Yellow Bilirubin: Neg mg/dL  Appearance: Clear Ketones: Neg mg/dL  Specific Gravity: 1.020 Blood: Neg ery/uL  pH: 7.0 Protein: Neg mg/dL  Glucose: 3+ mg/dL Urobilinogen: 0.2 mg/dL    Nitrites: Neg    Leukocyte Esterase: Neg leu/uL    ASSESSMENT:      ICD-10 Details  1 GU:   BPH w/LUTS - N40.1 Chronic, Stable - He has persistent urgency with BPH and BOO on cystoscopy but there is worrisome asymmetric erythema of the bladder neck that is worrisome for CIS but he did have a small stone in the prostatic urethra that was in the bladder on CT and could have caused inflammatory changes. I will send a  cytology and have him scheduled for a cystoscopy with bladder biopsy. The risks of bleeding, infection, injury to the urinary tract, thrombotic events and anesthetic complications reviewed.   2   Urinary Urgency - R39.15 Chronic, Stable  3   Renal calculus - N20.0 Undiagnosed New Problem - He has small bilateral renal stones and cysts.   4   Bladder Stone - N21.0 Undiagnosed New Problem - I will remove this if still present at cystoscopy.  5   Adrenal mass Unspec - D35.00 Chronic, Stable - He has stable adrenal adenomas.     PLAN:           Orders Labs Urine Cytology:  Was negative.          Schedule Return Visit/Planned Activity: Next Available Appointment - Schedule Surgery             Note: cystoscopy with bladder biopsy.

## 2020-12-09 NOTE — Op Note (Signed)
Procedure: 1.  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 2.  Transurethral biopsy of a right proximal prostatic lesion.  Preop diagnosis: Hematuria with erythematous bladder lesion on office cystoscopy and possible bladder stone.  Postop diagnosis: 3 cm anterior bladder wall neoplasm with dystrophic calcification, 1.5 cm right anterior lateral wall bladder neoplasm and 1.5 cm mucosal lesion of the right posterior proximal prostatic urethra.  Surgeon: Dr. Irine Seal.  Anesthesia: General.  Specimen: 1.  Chips from anterior wall bladder lesion, 2.  Chips from right lateral wall bladder lesion and 3.  Chips from right posterior prostatic lesion.  EBL: None.  Drains: 22 French Foley catheter.  Complications: None.  Indications: [name] [name] is a 65 year old male who was recently seen for cystoscopy for evaluation of hematuria.  He was found to have a small stone in the proximal static urethra and erythema of the bladder neck on the right it was felt that cystoscopy with bladder biopsy was indicated and removal of the stone as well.  He had a preoperative cytology that was negative.  Procedure: He was taken operating room where he was given 2 g of Ancef.  A general anesthetic was induced.  He was placed in lithotomy position and fitted with PAS hose.  His perineum and genitalia were prepped with Betadine solution he was draped in usual sterile fashion.  Cystoscopy was performed using a 23 [place] scope and 30 and 70 degree lenses.  Examination revealed a normal urethra.  The prostatic urethra was approximately 3 cm in length with bilobar hyperplasia with mild coaptation and some asymmetric enlargement of the middle lobe which is more prominent on the right.  There was some increased erythema and cobblestoning of the mucosa over the middle lobe on the right.  Inspection of the bladder revealed mild trabeculation.  The left ureteral orifice was unremarkable but I did not clearly  see the right ureteral orifice.  There was some small grains of stone material at the base the bladder which were evacuated.  Further inspection of the bladder demonstrated lesion on the right anterior lateral wall approximately 1.5 cm in size it was worrisome for a urothelial neoplasm and possibly carcinoma in situ.  There was a larger 3 to 4 cm lesion on the anterior bladder wall near the bladder neck that had some papillary changes consistent with a urothelial neoplasm with some associated dystrophic calcification but there was also some velvety mucosa once again worrisome for carcinoma in situ.  Once a thorough inspection of been performed, the cystoscope was removed and the 26 French continuous-flow resectoscope sheath was inserted.  The scope was fitted with an Beatrix Fetters handle with a bipolar loop and a 30 degree lens.  Saline was used as the irrigant.  The lesion on the anterior bladder wall was then resected and the specimen was retrieved.  The base of the resection was then generously fulgurated along with surrounding mucosa without evidence of residual tumor or bladder wall perforation.  I felt the muscle was in the specimen.  I then resected the 1.5 cm right anterior lateral lesion and once again felt muscle was in the specimen but there was no bladder perforation.  Finally the prostatic urethral lesion was resected and the specimen was collected.  The resection sites were then generously fulgurated to achieve hemostasis and anterior commissure of the prostate required some additional fulguration due to bleeding from the angulation required to resect the anterior lesion.  Final inspection revealed no active bleeding.  The left  ureteral orifice was once again seen but I was unable to identify the right ureteral orifice; however I did not feel it was involved in the resection field which was prostatic or anterior well away from the trigone.  The scope was removed and an 18 French Foley catheter was  inserted.  The balloon was filled with 10 mL of sterile fluid.  The catheter was placed to straight drainage with clear return.  He was taken down from lithotomy position, his anesthetic was reversed and he was moved to recovery in stable condition.  I did not instill gemcitabine since he appeared to have findings suggestive of carcinoma in situ and with his multifocal disease will need subsequent intravesical therapy.  There were no complications.

## 2020-12-09 NOTE — Anesthesia Postprocedure Evaluation (Signed)
Anesthesia Post Note  Patient: [name]  Procedure([name]) Performed: CYSTOSCOPY WITH TURBT, BIOPSY AND FULGURATION (N/A )     Patient location during evaluation: PACU Anesthesia Type: General Level of consciousness: awake and alert Pain management: pain level controlled Vital Signs Assessment: post-procedure vital signs reviewed and stable Respiratory status: spontaneous breathing, nonlabored ventilation, respiratory function stable and patient connected to nasal cannula oxygen Cardiovascular status: blood pressure returned to baseline and stable Postop Assessment: no apparent nausea or vomiting Anesthetic complications: no   No complications documented.  Last Vitals:  Vitals:   12/09/20 1119 12/09/20 1207  BP: (!) 154/91 (!) 162/94  Pulse: 70 67  Resp: 18 16  Temp: 36.5 C 37 C  SpO2: 100% 99%    Last Pain:  Vitals:   12/09/20 1207  TempSrc: Oral  PainSc: 0-No pain                 [name] [name],[name] [name]

## 2020-12-10 ENCOUNTER — Encounter (HOSPITAL_COMMUNITY): Payer: Self-pay | Admitting: Urology

## 2020-12-12 LAB — SURGICAL PATHOLOGY

## 2020-12-13 ENCOUNTER — Other Ambulatory Visit: Payer: Self-pay | Admitting: Urology

## 2020-12-23 DIAGNOSIS — C673 Malignant neoplasm of anterior wall of bladder: Secondary | ICD-10-CM | POA: Diagnosis not present

## 2020-12-25 ENCOUNTER — Other Ambulatory Visit: Payer: Self-pay | Admitting: Internal Medicine

## 2021-01-04 ENCOUNTER — Encounter (HOSPITAL_BASED_OUTPATIENT_CLINIC_OR_DEPARTMENT_OTHER): Payer: Self-pay | Admitting: Urology

## 2021-01-04 ENCOUNTER — Other Ambulatory Visit (HOSPITAL_COMMUNITY)
Admission: RE | Admit: 2021-01-04 | Discharge: 2021-01-04 | Disposition: A | Discharge status: Home / Self Care | Payer: BC Managed Care – PPO | Source: Ambulatory Visit | Attending: Urology | Admitting: Urology

## 2021-01-04 DIAGNOSIS — Z7984 Long term (current) use of oral hypoglycemic drugs: Secondary | ICD-10-CM | POA: Diagnosis not present

## 2021-01-04 DIAGNOSIS — Z20822 Contact with and (suspected) exposure to covid-19: Secondary | ICD-10-CM | POA: Insufficient documentation

## 2021-01-04 DIAGNOSIS — Z888 Allergy status to other drugs, medicaments and biological substances status: Secondary | ICD-10-CM | POA: Diagnosis not present

## 2021-01-04 DIAGNOSIS — R310 Gross hematuria: Secondary | ICD-10-CM | POA: Diagnosis not present

## 2021-01-04 DIAGNOSIS — Z01812 Encounter for preprocedural laboratory examination: Secondary | ICD-10-CM | POA: Insufficient documentation

## 2021-01-04 DIAGNOSIS — Z8551 Personal history of malignant neoplasm of bladder: Secondary | ICD-10-CM | POA: Diagnosis not present

## 2021-01-04 DIAGNOSIS — Z885 Allergy status to narcotic agent status: Secondary | ICD-10-CM | POA: Diagnosis not present

## 2021-01-04 DIAGNOSIS — F17210 Nicotine dependence, cigarettes, uncomplicated: Secondary | ICD-10-CM | POA: Diagnosis not present

## 2021-01-04 DIAGNOSIS — Z79899 Other long term (current) drug therapy: Secondary | ICD-10-CM | POA: Diagnosis not present

## 2021-01-04 LAB — SARS CORONAVIRUS 2 (TAT 6-24 HRS): SARS Coronavirus 2: NEGATIVE

## 2021-01-04 NOTE — Progress Notes (Addendum)
Reviewed pt chart for pre-op interview.  Noted pt has history difficult airway with anterior larynx.  Reviewed chart w/ anesthesia, Dr [name] [name]. [name] [name] MDA, stated since did ok with LMA with last surgery done @ [facility] 12-09-2020 ok to proceed at [facility].  Spoke w/ via phone for pre-op interview--- PT Lab needs dos----  Istat             Lab results------ current ekg in epic/ chart COVID test ------ done 01-04-2021 negative result in epic Arrive at ------- 0700 on 01-06-2021 NPO after MN NO Solid Food.  Clear liquids from MN until---  0600 Med rec completed Medications to take morning of surgery ----- Norvasc, Proscar Diabetic medication ----- do not take diabetic medication morning of surgery Patient instructed to bring photo id and insurance card day of surgery Patient aware to have Driver (ride ) / caregiver    for 24 hours after surgery --- wife, [name] [name] Patient Special Instructions ----- n/a  Pre-Op special Istructions ----- please see above that even though pt has hx difficult airway pt did ok with LMA with last surgery Patient verbalized understanding of instructions that were given at this phone interview. Patient denies shortness of breath, chest pain, fever, cough at this phone interview.

## 2021-01-05 ENCOUNTER — Encounter (HOSPITAL_BASED_OUTPATIENT_CLINIC_OR_DEPARTMENT_OTHER): Payer: Self-pay | Admitting: Urology

## 2021-01-05 ENCOUNTER — Ambulatory Visit (INDEPENDENT_AMBULATORY_CARE_PROVIDER_SITE_OTHER)
Admission: RE | Admit: 2021-01-05 | Discharge: 2021-01-05 | Disposition: A | Discharge status: Home / Self Care | Payer: Self-pay | Source: Ambulatory Visit | Attending: Internal Medicine | Admitting: Internal Medicine

## 2021-01-05 ENCOUNTER — Other Ambulatory Visit: Payer: Self-pay

## 2021-01-05 DIAGNOSIS — E118 Type 2 diabetes mellitus with unspecified complications: Secondary | ICD-10-CM

## 2021-01-05 DIAGNOSIS — E785 Hyperlipidemia, unspecified: Secondary | ICD-10-CM

## 2021-01-05 NOTE — Anesthesia Preprocedure Evaluation (Addendum)
Anesthesia Evaluation  Patient identified by MRN, date of birth, ID band Patient awake    Reviewed: Allergy & Precautions, H&P , NPO status , Patient's Chart, lab work & pertinent test results  History of Anesthesia Complications (+) DIFFICULT AIRWAY and history of anesthetic complications  Airway Mallampati: II  TM Distance: >3 FB Neck ROM: Full   Comment: Full beard Dental  (+) Dental Advisory Given, Edentulous Upper, Edentulous Lower   Pulmonary Current Smoker,    Pulmonary exam normal breath sounds clear to auscultation       Cardiovascular hypertension, Pt. on medications + CAD  Normal cardiovascular exam Rhythm:Regular Rate:Normal     Neuro/Psych  Headaches, negative psych ROS   GI/Hepatic Neg liver ROS, GERD  ,  Endo/Other  diabetes  Renal/GU Renal disease     Musculoskeletal  (+) Arthritis ,   Abdominal   Peds  Hematology negative hematology ROS (+)   Anesthesia Other Findings   Reproductive/Obstetrics                           Anesthesia Physical  Anesthesia Plan  ASA: III  Anesthesia Plan: General   Post-op Pain Management:    Induction: Intravenous  PONV Risk Score and Plan: 2 and Ondansetron, Treatment may vary due to age or medical condition, Dexamethasone and Midazolam  Airway Management Planned: LMA  Additional Equipment:   Intra-op Plan:   Post-operative Plan: Extubation in OR  Informed Consent: I have reviewed the patients History and Physical, chart, labs and discussed the procedure including the risks, benefits and alternatives for the proposed anesthesia with the patient or authorized representative who has indicated his/her understanding and acceptance.     Dental advisory given  Plan Discussed with: CRNA  Anesthesia Plan Comments: (See PAT note 11/29/2020, Konrad Felix, PA-C)       Anesthesia Quick Evaluation

## 2021-01-06 ENCOUNTER — Encounter (HOSPITAL_BASED_OUTPATIENT_CLINIC_OR_DEPARTMENT_OTHER): Payer: Self-pay | Admitting: Urology

## 2021-01-06 ENCOUNTER — Ambulatory Visit (HOSPITAL_BASED_OUTPATIENT_CLINIC_OR_DEPARTMENT_OTHER): Payer: BC Managed Care – PPO | Admitting: Anesthesiology

## 2021-01-06 ENCOUNTER — Encounter (HOSPITAL_BASED_OUTPATIENT_CLINIC_OR_DEPARTMENT_OTHER)
Admission: RE | Disposition: A | Discharge status: Home / Self Care | Payer: Self-pay | Source: Home / Self Care | Attending: Urology

## 2021-01-06 ENCOUNTER — Ambulatory Visit (HOSPITAL_BASED_OUTPATIENT_CLINIC_OR_DEPARTMENT_OTHER)
Admission: RE | Admit: 2021-01-06 | Discharge: 2021-01-06 | Disposition: A | Discharge status: Home / Self Care | Payer: BC Managed Care – PPO | Attending: Urology | Admitting: Urology

## 2021-01-06 DIAGNOSIS — Z888 Allergy status to other drugs, medicaments and biological substances status: Secondary | ICD-10-CM | POA: Diagnosis not present

## 2021-01-06 DIAGNOSIS — I2510 Atherosclerotic heart disease of native coronary artery without angina pectoris: Secondary | ICD-10-CM | POA: Diagnosis not present

## 2021-01-06 DIAGNOSIS — C673 Malignant neoplasm of anterior wall of bladder: Secondary | ICD-10-CM | POA: Diagnosis not present

## 2021-01-06 DIAGNOSIS — R310 Gross hematuria: Secondary | ICD-10-CM | POA: Insufficient documentation

## 2021-01-06 DIAGNOSIS — F17210 Nicotine dependence, cigarettes, uncomplicated: Secondary | ICD-10-CM | POA: Insufficient documentation

## 2021-01-06 DIAGNOSIS — D494 Neoplasm of unspecified behavior of bladder: Secondary | ICD-10-CM | POA: Diagnosis not present

## 2021-01-06 DIAGNOSIS — Z885 Allergy status to narcotic agent status: Secondary | ICD-10-CM | POA: Insufficient documentation

## 2021-01-06 DIAGNOSIS — Z79899 Other long term (current) drug therapy: Secondary | ICD-10-CM | POA: Diagnosis not present

## 2021-01-06 DIAGNOSIS — Z8551 Personal history of malignant neoplasm of bladder: Secondary | ICD-10-CM | POA: Insufficient documentation

## 2021-01-06 DIAGNOSIS — Z20822 Contact with and (suspected) exposure to covid-19: Secondary | ICD-10-CM | POA: Diagnosis not present

## 2021-01-06 DIAGNOSIS — N3289 Other specified disorders of bladder: Secondary | ICD-10-CM | POA: Diagnosis not present

## 2021-01-06 DIAGNOSIS — Z7984 Long term (current) use of oral hypoglycemic drugs: Secondary | ICD-10-CM | POA: Insufficient documentation

## 2021-01-06 DIAGNOSIS — I10 Essential (primary) hypertension: Secondary | ICD-10-CM | POA: Diagnosis not present

## 2021-01-06 DIAGNOSIS — E119 Type 2 diabetes mellitus without complications: Secondary | ICD-10-CM | POA: Diagnosis not present

## 2021-01-06 DIAGNOSIS — N3020 Other chronic cystitis without hematuria: Secondary | ICD-10-CM | POA: Diagnosis not present

## 2021-01-06 HISTORY — DX: Malignant neoplasm of bladder, unspecified: C67.9

## 2021-01-06 HISTORY — DX: Personal history of other malignant neoplasm of kidney: Z85.528

## 2021-01-06 HISTORY — DX: Other cervical disc degeneration, unspecified cervical region: M50.30

## 2021-01-06 HISTORY — DX: Benign prostatic hyperplasia with lower urinary tract symptoms: N40.1

## 2021-01-06 HISTORY — DX: Male erectile dysfunction, unspecified: N52.9

## 2021-01-06 HISTORY — DX: Personal history of colonic polyps: Z86.010

## 2021-01-06 HISTORY — DX: Personal history of adenomatous and serrated colon polyps: Z86.0101

## 2021-01-06 HISTORY — PX: TRANSURETHRAL RESECTION OF BLADDER TUMOR WITH MITOMYCIN-C: SHX6459

## 2021-01-06 HISTORY — DX: Hyperlipidemia, unspecified: E78.5

## 2021-01-06 LAB — GLUCOSE, CAPILLARY: Glucose-Capillary: 154 mg/dL — ABNORMAL HIGH (ref 70–99)

## 2021-01-06 LAB — POCT I-STAT, CHEM 8
BUN: 21 mg/dL (ref 8–23)
Calcium, Ion: 1.29 mmol/L (ref 1.15–1.40)
Chloride: 107 mmol/L (ref 98–111)
Creatinine, Ser: 0.5 mg/dL — ABNORMAL LOW (ref 0.61–1.24)
Glucose, Bld: 171 mg/dL — ABNORMAL HIGH (ref 70–99)
HCT: 53 % — ABNORMAL HIGH (ref 39.0–52.0)
Hemoglobin: 18 g/dL — ABNORMAL HIGH (ref 13.0–17.0)
Potassium: 3.6 mmol/L (ref 3.5–5.1)
Sodium: 144 mmol/L (ref 135–145)
TCO2: 25 mmol/L (ref 22–32)

## 2021-01-06 SURGERY — TRANSURETHRAL RESECTION OF BLADDER TUMOR WITH MITOMYCIN-C
Anesthesia: General | Site: Bladder

## 2021-01-06 MED ORDER — GEMCITABINE CHEMO FOR BLADDER INSTILLATION 2000 MG
2000.0000 mg | Freq: Once | INTRAVENOUS | Status: AC
  Administered 2021-01-06: 2000 mg via INTRAVESICAL
  Filled 2021-01-06: qty 2000

## 2021-01-06 MED ORDER — ONDANSETRON HCL 4 MG/2ML IJ SOLN
INTRAMUSCULAR | Status: DC | PRN
  Administered 2021-01-06: 4 mg via INTRAVENOUS

## 2021-01-06 MED ORDER — AMISULPRIDE (ANTIEMETIC) 5 MG/2ML IV SOLN: 10.0000 mg | Freq: Once | INTRAVENOUS | Status: DC | PRN

## 2021-01-06 MED ORDER — LIDOCAINE 2% (20 MG/ML) 5 ML SYRINGE
INTRAMUSCULAR | Status: AC
  Filled 2021-01-06: qty 5

## 2021-01-06 MED ORDER — DEXAMETHASONE SODIUM PHOSPHATE 10 MG/ML IJ SOLN
INTRAMUSCULAR | Status: DC | PRN
  Administered 2021-01-06: 4 mg via INTRAVENOUS

## 2021-01-06 MED ORDER — CEFAZOLIN SODIUM-DEXTROSE 2-4 GM/100ML-% IV SOLN
INTRAVENOUS | Status: AC
  Filled 2021-01-06: qty 100

## 2021-01-06 MED ORDER — DEXAMETHASONE SODIUM PHOSPHATE 10 MG/ML IJ SOLN
INTRAMUSCULAR | Status: AC
  Filled 2021-01-06: qty 1

## 2021-01-06 MED ORDER — PROPOFOL 10 MG/ML IV BOLUS
INTRAVENOUS | Status: AC
  Filled 2021-01-06: qty 40

## 2021-01-06 MED ORDER — SODIUM CHLORIDE 0.9 % IR SOLN
Status: DC | PRN
  Administered 2021-01-06 (×3): 3000 mL

## 2021-01-06 MED ORDER — FENTANYL CITRATE (PF) 100 MCG/2ML IJ SOLN
INTRAMUSCULAR | Status: AC
  Filled 2021-01-06: qty 2

## 2021-01-06 MED ORDER — SODIUM CHLORIDE 0.9% FLUSH: 3.0000 mL | Freq: Two times a day (BID) | INTRAVENOUS | Status: DC

## 2021-01-06 MED ORDER — HYDROCODONE-ACETAMINOPHEN 5-325 MG PO TABS: 1.0000 | ORAL_TABLET | Freq: Four times a day (QID) | ORAL | 0 refills | Status: DC | PRN

## 2021-01-06 MED ORDER — FENTANYL CITRATE (PF) 100 MCG/2ML IJ SOLN
INTRAMUSCULAR | Status: DC | PRN
  Administered 2021-01-06: 25 ug via INTRAVENOUS
  Administered 2021-01-06: 50 ug via INTRAVENOUS
  Administered 2021-01-06: 25 ug via INTRAVENOUS

## 2021-01-06 MED ORDER — PROPOFOL 10 MG/ML IV BOLUS
INTRAVENOUS | Status: DC | PRN
  Administered 2021-01-06: 150 mg via INTRAVENOUS

## 2021-01-06 MED ORDER — LACTATED RINGERS IV SOLN: INTRAVENOUS | Status: DC

## 2021-01-06 MED ORDER — MIDAZOLAM HCL 2 MG/2ML IJ SOLN
INTRAMUSCULAR | Status: AC
  Filled 2021-01-06: qty 2

## 2021-01-06 MED ORDER — LIDOCAINE 2% (20 MG/ML) 5 ML SYRINGE
INTRAMUSCULAR | Status: DC | PRN
  Administered 2021-01-06: 100 mg via INTRAVENOUS

## 2021-01-06 MED ORDER — FENTANYL CITRATE (PF) 100 MCG/2ML IJ SOLN
25.0000 ug | INTRAMUSCULAR | Status: DC | PRN
Start: 2021-01-06 — End: 2021-01-06

## 2021-01-06 MED ORDER — MIDAZOLAM HCL 5 MG/5ML IJ SOLN
INTRAMUSCULAR | Status: DC | PRN
  Administered 2021-01-06: 2 mg via INTRAVENOUS

## 2021-01-06 MED ORDER — CEFAZOLIN SODIUM-DEXTROSE 2-4 GM/100ML-% IV SOLN
2.0000 g | INTRAVENOUS | Status: AC
  Administered 2021-01-06: 2 g via INTRAVENOUS

## 2021-01-06 SURGICAL SUPPLY — 27 items
BAG DRAIN URO-CYSTO SKYTR STRL (DRAIN) ×2 IMPLANT
BAG DRN RND TRDRP ANRFLXCHMBR (UROLOGICAL SUPPLIES) ×1
BAG DRN UROCATH (DRAIN) ×1
BAG URINE DRAIN 2000ML AR STRL (UROLOGICAL SUPPLIES) ×2 IMPLANT
BAG URINE LEG 500ML (DRAIN) IMPLANT
CATH FOLEY 2WAY SLVR  5CC 20FR (CATHETERS)
CATH FOLEY 2WAY SLVR  5CC 22FR (CATHETERS) ×2
CATH FOLEY 2WAY SLVR 5CC 20FR (CATHETERS) IMPLANT
CATH FOLEY 2WAY SLVR 5CC 22FR (CATHETERS) ×1 IMPLANT
CLOTH BEACON ORANGE TIMEOUT ST (SAFETY) ×2 IMPLANT
ELECT REM PT RETURN 9FT ADLT (ELECTROSURGICAL) ×2
ELECTRODE REM PT RTRN 9FT ADLT (ELECTROSURGICAL) ×1 IMPLANT
GLOVE SURG POLYISO LF SZ8 (GLOVE) ×2 IMPLANT
GLOVE SURG UNDER POLY LF SZ7 (GLOVE) ×4 IMPLANT
GOWN STRL REUS W/TWL LRG LVL3 (GOWN DISPOSABLE) ×2 IMPLANT
GOWN STRL REUS W/TWL XL LVL3 (GOWN DISPOSABLE) ×2 IMPLANT
HOLDER FOLEY CATH W/STRAP (MISCELLANEOUS) ×2 IMPLANT
IV NS IRRIG 3000ML ARTHROMATIC (IV SOLUTION) ×6 IMPLANT
KIT TURNOVER CYSTO (KITS) ×2 IMPLANT
LOOP CUT BIPOLAR 24F LRG (ELECTROSURGICAL) IMPLANT
MANIFOLD NEPTUNE II (INSTRUMENTS) ×2 IMPLANT
PACK CYSTO (CUSTOM PROCEDURE TRAY) ×2 IMPLANT
PLUG CATH AND CAP STER (CATHETERS) IMPLANT
SYR TOOMEY IRRIG 70ML (MISCELLANEOUS) ×2
SYRINGE TOOMEY IRRIG 70ML (MISCELLANEOUS) ×1 IMPLANT
TUBE CONNECTING 12X1/4 (SUCTIONS) ×2 IMPLANT
TUBING UROLOGY SET (TUBING) ×2 IMPLANT

## 2021-01-06 NOTE — Interval H&P Note (Signed)
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   01/06/2021 8:53 AM  [name] [name]  has presented today for surgery, with the diagnosis of bladder cancer.  The various methods of treatment have been discussed with the patient and family. After consideration of risks, benefits and other options for treatment, the patient has consented to  Procedure(s): TRANSURETHRAL RESECTION OF BLADDER TUMOR WITH GEMCITIBINE (N/A) as a surgical intervention.  The patient's history has been reviewed, patient examined, no change in status, stable for surgery.  I have reviewed the patient's chart and labs.  Questions were answered to the patient's satisfaction.     Irine Seal

## 2021-01-06 NOTE — Discharge Instructions (Addendum)
Post Anesthesia Home Care Instructions  Activity: Get plenty of rest for the remainder of the day. A responsible adult should stay with you for 24 hours following the procedure.  For the next 24 hours, DO NOT: -Drive a car -Paediatric nurse -Drink alcoholic beverages -Take any medication unless instructed by your physician -Make any legal decisions or sign important papers.  Meals: Start with liquid foods such as gelatin or soup. Progress to regular foods as tolerated. Avoid greasy, spicy, heavy foods. If nausea and/or vomiting occur, drink only clear liquids until the nausea and/or vomiting subsides. Call your physician if vomiting continues.  Special Instructions/Symptoms: Your throat may feel dry or sore from the anesthesia or the breathing tube placed in your throat during surgery. If this causes discomfort, gargle with warm salt water. The discomfort should disappear within 24 hours.      Transurethral Resection of Bladder Tumor, Care After This sheet gives you information about how to care for yourself after your procedure. Your health care provider may also give you more specific instructions. If you have problems or questions, contact your health care provider. What can I expect after the procedure? After the procedure, it is common to have:  A small amount of blood in your urine for up to 2 weeks.  Soreness or mild pain from your catheter. After your catheter is removed, you may have mild soreness, especially when urinating.  Pain in your lower abdomen. Follow these instructions at home: Medicines  Take over-the-counter and prescription medicines only as told by your health care provider.  If you were prescribed an antibiotic medicine, take it as told by your health care provider. Do not stop taking the antibiotic even if you start to feel better.  Do not drive for 24 hours if you were given a sedative during your procedure.  Ask your health care provider if the  medicine prescribed to you: ? Requires you to avoid driving or using heavy machinery. ? Can cause constipation. You may need to take these actions to prevent or treat constipation:  Take over-the-counter or prescription medicines.  Eat foods that are high in fiber, such as beans, whole grains, and fresh fruits and vegetables.  Limit foods that are high in fat and processed sugars, such as fried or sweet foods.   Activity  Return to your normal activities as told by your health care provider. Ask your health care provider what activities are safe for you.  Do not lift anything that is heavier than 10 lb (4.5 kg), or the limit that you are told, until your health care provider says that it is safe.  Avoid intense physical activity for as long as told by your health care provider.  Rest as told by your health care provider.  Avoid sitting for a long time without moving. Get up to take short walks every 1-2 hours. This is important to improve blood flow and breathing. Ask for help if you feel weak or unsteady. General instructions  Do not drink alcohol for as long as told by your health care provider. This is especially important if you are taking prescription pain medicines.  Do not take baths, swim, or use a hot tub until your health care provider approves. Ask your health care provider if you may take showers. You may only be allowed to take sponge baths.  If you have a catheter, follow instructions from your health care provider about caring for your catheter and your drainage bag.  Drink enough  fluid to keep your urine pale yellow.  Wear compression stockings as told by your health care provider. These stockings help to prevent blood clots and reduce swelling in your legs.  Keep all follow-up visits as told by your health care provider. This is important. ? You will need to be followed closely with regular checks of your bladder and urethra (cystoscopies) to make sure that the  cancer does not come back.   Contact a health care provider if:  You have pain that gets worse or does not improve with medicine.  You have blood in your urine for more than 2 weeks.  You have cloudy or bad-smelling urine.  You become constipated. Signs of constipation may include having: ? Fewer than three bowel movements in a week. ? Difficulty having a bowel movement. ? Stools that are dry, hard, or larger than normal.  You have a fever. Get help right away if:  You have: ? Severe pain. ? Bright red blood in your urine. ? Blood clots in your urine. ? A lot of blood in your urine.  Your catheter has been removed and you are not able to urinate.  You have a catheter in place and the catheter is not draining urine. Summary  After your procedure, it is common to have a small amount of blood in your urine, soreness or mild pain from your catheter, and pain in your lower abdomen.  Take over-the-counter and prescription medicines only as told by your health care provider.  Rest as told by your health care provider. Follow your health care provider's instructions about returning to normal activities. Ask what activities are safe for you.  If you have a catheter, follow instructions from your health care provider about caring for your catheter and your drainage bag.  Get help right away if you cannot urinate, you have severe pain, or you have bright red blood or blood clots in your urine. This information is not intended to replace advice given to you by your health care provider. Make sure you discuss any questions you have with your health care provider. Document Revised: 05/01/2018 Document Reviewed: 05/01/2018 Elsevier Patient Education  [place].

## 2021-01-06 NOTE — Transfer of Care (Signed)
Immediate Anesthesia Transfer of Care Note  Patient: [name] [name]  Procedure(s) Performed: TRANSURETHRAL RESECTION OF BLADDER TUMOR WITH GEMCITIBINE IN PACU (N/A Bladder)  Patient Location: PACU  Anesthesia Type:General  Level of Consciousness: sedated  Airway & Oxygen Therapy: Patient Spontanous Breathing and Patient connected to nasal cannula oxygen  Post-op Assessment: Report given to RN  Post vital signs: Reviewed and stable  Last Vitals:  Vitals Value Taken Time  BP 128/81   Temp    Pulse 66 01/06/21 0946  Resp 15 01/06/21 0946  SpO2 97 % 01/06/21 0946  Vitals shown include unvalidated device data.  Last Pain:  Vitals:   01/06/21 0746  TempSrc: Oral  PainSc: 0-No pain      Patients Stated Pain Goal: 4 (13/08/65 [id number])  Complications: No complications documented.

## 2021-01-06 NOTE — Anesthesia Procedure Notes (Signed)
Procedure Name: LMA Insertion Date/Time: 01/06/2021 9:06 AM Performed by: Bonney Aid, CRNA Pre-anesthesia Checklist: Patient identified, Emergency Drugs available, Suction available and Patient being monitored Patient Re-evaluated:Patient Re-evaluated prior to induction Oxygen Delivery Method: Circle system utilized Preoxygenation: Pre-oxygenation with 100% oxygen Induction Type: IV induction Ventilation: Mask ventilation without difficulty LMA: LMA inserted LMA Size: 5.0 Number of attempts: 1 Airway Equipment and Method: Bite block Placement Confirmation: positive ETCO2 Tube secured with: Tape Dental Injury: Teeth and Oropharynx as per pre-operative assessment

## 2021-01-06 NOTE — Anesthesia Postprocedure Evaluation (Signed)
Anesthesia Post Note  Patient: [name] [name]  Procedure(s) Performed: TRANSURETHRAL RESECTION OF BLADDER TUMOR WITH GEMCITIBINE IN PACU (N/A Bladder)     Patient location during evaluation: PACU Anesthesia Type: General Level of consciousness: sedated and patient cooperative Pain management: pain level controlled Vital Signs Assessment: post-procedure vital signs reviewed and stable Respiratory status: spontaneous breathing Cardiovascular status: stable Anesthetic complications: no   No complications documented.  Last Vitals:  Vitals:   01/06/21 1117 01/06/21 1215  BP: (!) 141/79 (!) 157/92  Pulse: 64 62  Resp: 15 14  Temp: 36.6 C 36.8 C  SpO2: 98% 98%    Last Pain:  Vitals:   01/06/21 1215  TempSrc:   PainSc: 0-No pain                 Nolon Nations

## 2021-01-06 NOTE — Op Note (Signed)
Procedure: Cystoscopy with restaging TURBT of large anterior bladder tumor.  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  Postop diagnosis: Same.  Surgeon: Dr. Irine Seal.  Anesthesia: General.  Specimen: Bladder tumor chips.  Drains: 75 French Foley catheter.  Complications: None.  EBL: None.  Indications: The patient is a 65 year old male who previously undergone transurethral resection of 3 lesions in the bladder and prostatic urethra.  There was a large anterior wall lesion there was found to be a T1 high-grade lesion and it was felt that restaging was indicated.  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  Procedure: He was given 2 g of Ancef.  A general anesthetic was induced.  He was placed in the lithotomy position and fitted with PAS hose.  His perineum and genitalia were prepped with Betadine solution he was draped in usual sterile fashion.  His urethra was calibrated to 30 [place] with [employer] and a 26 French continuous-flow resectoscope sheath was inserted with the aid of visual obturator.  Inspection revealed a normal urethra.  The external sphincter was intact.  The prostatic urethra had bilobar hyperplasia with some coaptation and obstruction.  There was a resection defect at the right proximal posterior prostatic urethra that was in early stages of healing.  Anteriorly in the proximal prostate there was a little bit of dependent tissue that probably is residual from the prior procedure which required significant angulation the scope anteriorly.  This material was removed and the area was cauterized.  Inspection of the bladder demonstrated a resection lesion on the right lateral wall that was healing and a large resection lesion on the anterior wall with considerable fibrinous  exudate and a rim of edema and erythema but no obvious papillary lesions.  The remainder of the bladder wall had mild trabeculation but was otherwise unremarkable.  Ureteral orifices were in the normal anatomic position.  The anterior wall lesion was then reresected.  The resection was made difficult by some of the density of the fibrotic material from the prior resection but I felt I got down in the muscle and several areas.  The resection diameter was approximately 5 to 6 cm in greatest extent.  The bladder was evacuated free of the tissue and final hemostasis was achieved.  The scope was removed and a 22 French Foley catheter was inserted without difficulty.  The balloon was filled with 10 mL of sterile fluid.  The catheter was connected to straight drainage.  He was taken down from lithotomy position, his anesthetic was reversed and he was moved to recovery room in stable condition.  His bladder was instilled with 2000 mg of gemcitabine in PACU.  This was left indwelling for an hour and the catheter was then removed after drainage.  There were no complications.

## 2021-01-08 ENCOUNTER — Encounter: Payer: Self-pay | Admitting: Internal Medicine

## 2021-01-08 DIAGNOSIS — I7121 Aneurysm of the ascending aorta, without rupture: Secondary | ICD-10-CM | POA: Insufficient documentation

## 2021-01-08 DIAGNOSIS — I712 Thoracic aortic aneurysm, without rupture: Secondary | ICD-10-CM | POA: Insufficient documentation

## 2021-01-08 DIAGNOSIS — I2510 Atherosclerotic heart disease of native coronary artery without angina pectoris: Secondary | ICD-10-CM | POA: Insufficient documentation

## 2021-01-09 ENCOUNTER — Encounter (HOSPITAL_BASED_OUTPATIENT_CLINIC_OR_DEPARTMENT_OTHER): Payer: Self-pay | Admitting: Urology

## 2021-01-09 LAB — SURGICAL PATHOLOGY

## 2021-01-16 DIAGNOSIS — N401 Enlarged prostate with lower urinary tract symptoms: Secondary | ICD-10-CM | POA: Diagnosis not present

## 2021-01-16 DIAGNOSIS — R3915 Urgency of urination: Secondary | ICD-10-CM | POA: Diagnosis not present

## 2021-01-16 DIAGNOSIS — C673 Malignant neoplasm of anterior wall of bladder: Secondary | ICD-10-CM | POA: Diagnosis not present

## 2021-02-07 DIAGNOSIS — C673 Malignant neoplasm of anterior wall of bladder: Secondary | ICD-10-CM | POA: Diagnosis not present

## 2021-02-07 DIAGNOSIS — Z5111 Encounter for antineoplastic chemotherapy: Secondary | ICD-10-CM | POA: Diagnosis not present

## 2021-02-14 DIAGNOSIS — C673 Malignant neoplasm of anterior wall of bladder: Secondary | ICD-10-CM | POA: Diagnosis not present

## 2021-02-14 DIAGNOSIS — Z5111 Encounter for antineoplastic chemotherapy: Secondary | ICD-10-CM | POA: Diagnosis not present

## 2021-02-21 DIAGNOSIS — Z5111 Encounter for antineoplastic chemotherapy: Secondary | ICD-10-CM | POA: Diagnosis not present

## 2021-02-21 DIAGNOSIS — C673 Malignant neoplasm of anterior wall of bladder: Secondary | ICD-10-CM | POA: Diagnosis not present

## 2021-02-28 DIAGNOSIS — C673 Malignant neoplasm of anterior wall of bladder: Secondary | ICD-10-CM | POA: Diagnosis not present

## 2021-02-28 DIAGNOSIS — Z5111 Encounter for antineoplastic chemotherapy: Secondary | ICD-10-CM | POA: Diagnosis not present

## 2021-03-06 ENCOUNTER — Other Ambulatory Visit (INDEPENDENT_AMBULATORY_CARE_PROVIDER_SITE_OTHER): Payer: BC Managed Care – PPO

## 2021-03-06 DIAGNOSIS — E118 Type 2 diabetes mellitus with unspecified complications: Secondary | ICD-10-CM | POA: Diagnosis not present

## 2021-03-06 DIAGNOSIS — Z0000 Encounter for general adult medical examination without abnormal findings: Secondary | ICD-10-CM

## 2021-03-06 LAB — CBC WITH DIFFERENTIAL/PLATELET
Basophils Absolute: 0.1 10*3/uL (ref 0.0–0.1)
Basophils Relative: 0.9 % (ref 0.0–3.0)
Eosinophils Absolute: 0.3 10*3/uL (ref 0.0–0.7)
Eosinophils Relative: 3.7 % (ref 0.0–5.0)
HCT: 51.3 % (ref 39.0–52.0)
Hemoglobin: 17.7 g/dL — ABNORMAL HIGH (ref 13.0–17.0)
Lymphocytes Relative: 13.1 % (ref 12.0–46.0)
Lymphs Abs: 1.1 10*3/uL (ref 0.7–4.0)
MCHC: 34.4 g/dL (ref 30.0–36.0)
MCV: 87.4 fl (ref 78.0–100.0)
Monocytes Absolute: 0.6 10*3/uL (ref 0.1–1.0)
Monocytes Relative: 7.3 % (ref 3.0–12.0)
Neutro Abs: 6.1 10*3/uL (ref 1.4–7.7)
Neutrophils Relative %: 75 % (ref 43.0–77.0)
Platelets: 132 10*3/uL — ABNORMAL LOW (ref 150.0–400.0)
RBC: 5.87 Mil/uL — ABNORMAL HIGH (ref 4.22–5.81)
RDW: 14.7 % (ref 11.5–15.5)
WBC: 8.2 10*3/uL (ref 4.0–10.5)

## 2021-03-06 LAB — URINALYSIS
Bilirubin Urine: NEGATIVE
Ketones, ur: NEGATIVE
Leukocytes,Ua: NEGATIVE
Nitrite: NEGATIVE
Specific Gravity, Urine: 1.01 (ref 1.000–1.030)
Total Protein, Urine: NEGATIVE
Urine Glucose: >=1000 — AB
Urobilinogen, UA: 0.2 (ref 0.0–1.0)
pH: 6.5 (ref 5.0–8.0)

## 2021-03-06 LAB — COMPREHENSIVE METABOLIC PANEL
ALT: 23 U/L (ref 0–53)
AST: 11 U/L (ref 0–37)
Albumin: 4.4 g/dL (ref 3.5–5.2)
Alkaline Phosphatase: 109 U/L (ref 39–117)
BUN: 16 mg/dL (ref 6–23)
CO2: 28 mEq/L (ref 19–32)
Calcium: 9.6 mg/dL (ref 8.4–10.5)
Chloride: 107 mEq/L (ref 96–112)
Creatinine, Ser: 0.75 mg/dL (ref 0.40–1.50)
GFR: 93.97 mL/min (ref >60.00)
Glucose, Bld: 143 mg/dL — ABNORMAL HIGH (ref 70–99)
Potassium: 3.7 mEq/L (ref 3.5–5.1)
Sodium: 143 mEq/L (ref 135–145)
Total Bilirubin: 0.5 mg/dL (ref 0.2–1.2)
Total Protein: 6.6 g/dL (ref 6.0–8.3)

## 2021-03-06 LAB — HEMOGLOBIN A1C: Hgb A1c MFr Bld: 7.6 % — ABNORMAL HIGH (ref 4.6–6.5)

## 2021-03-06 LAB — PSA: PSA: 3.87 ng/mL (ref 0.10–4.00)

## 2021-03-06 LAB — MICROALBUMIN / CREATININE URINE RATIO
Creatinine,U: 50.4 mg/dL
Microalb Creat Ratio: 2.5 mg/g (ref 0.0–30.0)
Microalb, Ur: 1.3 mg/dL (ref 0.0–1.9)

## 2021-03-06 LAB — TSH: TSH: 0.64 u[IU]/mL (ref 0.35–4.50)

## 2021-03-06 LAB — LIPID PANEL
Cholesterol: 153 mg/dL (ref 0–200)
HDL: 33.5 mg/dL — ABNORMAL LOW (ref >39.00)
LDL Cholesterol: 86 mg/dL (ref 0–99)
NonHDL: 119.63
Total CHOL/HDL Ratio: 5
Triglycerides: 170 mg/dL — ABNORMAL HIGH (ref 0.0–149.0)
VLDL: 34 mg/dL (ref 0.0–40.0)

## 2021-03-07 DIAGNOSIS — C673 Malignant neoplasm of anterior wall of bladder: Secondary | ICD-10-CM | POA: Diagnosis not present

## 2021-03-07 DIAGNOSIS — Z5111 Encounter for antineoplastic chemotherapy: Secondary | ICD-10-CM | POA: Diagnosis not present

## 2021-03-08 ENCOUNTER — Ambulatory Visit: Payer: BC Managed Care – PPO | Admitting: Internal Medicine

## 2021-03-14 DIAGNOSIS — C673 Malignant neoplasm of anterior wall of bladder: Secondary | ICD-10-CM | POA: Diagnosis not present

## 2021-03-14 DIAGNOSIS — Z5111 Encounter for antineoplastic chemotherapy: Secondary | ICD-10-CM | POA: Diagnosis not present

## 2021-03-25 ENCOUNTER — Other Ambulatory Visit: Payer: Self-pay | Admitting: Internal Medicine

## 2021-04-19 ENCOUNTER — Ambulatory Visit: Payer: BC Managed Care – PPO | Admitting: Internal Medicine

## 2021-04-26 ENCOUNTER — Encounter: Payer: Self-pay | Admitting: Internal Medicine

## 2021-04-26 ENCOUNTER — Ambulatory Visit: Payer: BC Managed Care – PPO | Admitting: Internal Medicine

## 2021-04-26 ENCOUNTER — Other Ambulatory Visit: Payer: Self-pay

## 2021-04-26 DIAGNOSIS — I712 Thoracic aortic aneurysm, without rupture: Secondary | ICD-10-CM

## 2021-04-26 DIAGNOSIS — I700 Atherosclerosis of aorta: Secondary | ICD-10-CM

## 2021-04-26 DIAGNOSIS — E118 Type 2 diabetes mellitus with unspecified complications: Secondary | ICD-10-CM | POA: Diagnosis not present

## 2021-04-26 DIAGNOSIS — C641 Malignant neoplasm of right kidney, except renal pelvis: Secondary | ICD-10-CM | POA: Diagnosis not present

## 2021-04-26 DIAGNOSIS — I7121 Aneurysm of the ascending aorta, without rupture: Secondary | ICD-10-CM

## 2021-04-26 DIAGNOSIS — I2510 Atherosclerotic heart disease of native coronary artery without angina pectoris: Secondary | ICD-10-CM

## 2021-04-26 DIAGNOSIS — I10 Essential (primary) hypertension: Secondary | ICD-10-CM

## 2021-04-26 DIAGNOSIS — E785 Hyperlipidemia, unspecified: Secondary | ICD-10-CM

## 2021-04-26 DIAGNOSIS — I2583 Coronary atherosclerosis due to lipid rich plaque: Secondary | ICD-10-CM

## 2021-04-26 DIAGNOSIS — F17200 Nicotine dependence, unspecified, uncomplicated: Secondary | ICD-10-CM

## 2021-04-26 DIAGNOSIS — D751 Secondary polycythemia: Secondary | ICD-10-CM

## 2021-04-26 NOTE — Assessment & Plan Note (Signed)
Cont w/Synjardy, Prandin

## 2021-04-26 NOTE — Assessment & Plan Note (Signed)
Fish oil Pt declined statins

## 2021-04-26 NOTE — Assessment & Plan Note (Signed)
Pt resumed smoking - needs to quit BP control

## 2021-04-26 NOTE — Assessment & Plan Note (Signed)
Monitor CBC 

## 2021-04-26 NOTE — Assessment & Plan Note (Signed)
Cont Olmesartan; Norvasc

## 2021-04-26 NOTE — Assessment & Plan Note (Signed)
Pt resumed smoking - needs to quit

## 2021-04-26 NOTE — Progress Notes (Signed)
Subjective:  Patient ID: [name] [name], male    DOB: 1955/12/31  Age: 65 y.o. MRN: [id number]  CC: Follow-up (4-5 month f/u)   HPI  [name] [name] presents for HTN, DM, dyslipidemia  S/p surgery 12/09/20: 1.  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 2.  Transurethral biopsy of a right proximal prostatic lesion.   Preop diagnosis: Hematuria with erythematous bladder lesion on office cystoscopy and possible bladder stone.   Postop diagnosis: 3 cm anterior bladder wall neoplasm with dystrophic calcification, 1.5 cm right anterior lateral wall bladder neoplasm and 1.5 cm mucosal lesion of the right posterior proximal prostatic urethra.   Surgeon: Dr. Irine [name].  Bladen was out of meds for a while    Outpatient Medications Prior to Visit  Medication Sig Dispense Refill   amLODipine (NORVASC) 5 MG tablet TAKE 1 TABLET BY MOUTH EVERY DAY (Patient taking differently: Take 5 mg by mouth daily.) 90 tablet 3   Ascorbic Acid (VITAMIN C) 1000 MG tablet Take 1,000 mg by mouth daily as needed (immunine).     blood glucose meter kit and supplies KIT Use to test blood sugar once a day. DX: E11.9 1 each 0   Blood Glucose Monitoring Suppl (CONTOUR NEXT MONITOR) w/Device KIT 1 Device by Does not apply route daily. 1 kit 0   cholecalciferol (VITAMIN D) 1000 UNITS tablet Take 1,000 Units by mouth daily.     CONTOUR NEXT TEST test strip USE AS DIRECTED 100 strip 5   Empagliflozin-metFORMIN HCl ER (SYNJARDY XR) 12.02-999 MG TB24 Take 1 tablet by mouth 2 (two) times daily. (Patient taking differently: Take 1 tablet by mouth 2 (two) times daily.) 180 tablet 3   finasteride (PROSCAR) 5 MG tablet TAKE 1 TABLET BY MOUTH EVERY DAY (Patient taking differently: Take 5 mg by mouth daily.) 90 tablet 3   Lancets MISC Use to check blood sugar once per day. 100 each 3   olmesartan (BENICAR) 40 MG tablet Take 1 tablet (40 mg total) by mouth daily. (Patient taking differently: Take 40  mg by mouth daily.) 90 tablet 3   potassium chloride SA (KLOR-CON) 20 MEQ tablet Take 1 tablet (20 mEq total) by mouth daily. 90 tablet 11   repaglinide (PRANDIN) 2 MG tablet TAKE 1 TABLET BY MOUTH 3 TIMES A DAY BEFORE MEALS 270 tablet 3   tadalafil (CIALIS) 5 MG tablet Take 5 mg by mouth daily.     HYDROcodone-acetaminophen (NORCO/VICODIN) 5-325 MG tablet Take 1 tablet by mouth every 6 (six) hours as needed for moderate pain. (Patient not taking: Reported on 04/26/2021) 6 tablet 0   NONFORMULARY OR COMPOUNDED ITEM [facility]:  Antifungal Cream - Terbinafine 3%, Fluconazole 2%, Tea Tree Oil 5%, Urea 10%, Ibuprofen 2% in DMSO suspension #68m. Apply to the affected nail(s) once (at bedtime) or twice daily. (Patient not taking: No sig reported) 30 each 5   No facility-administered medications prior to visit.    ROS: Review of Systems  Constitutional:  Negative for appetite change, fatigue and unexpected weight change.  HENT:  Negative for congestion, nosebleeds, sneezing, sore throat and trouble swallowing.   Eyes:  Negative for itching and visual disturbance.  Respiratory:  Negative for cough.   Cardiovascular:  Negative for chest pain, palpitations and leg swelling.  Gastrointestinal:  Negative for abdominal distention, blood in stool, diarrhea and nausea.  Genitourinary:  Negative for frequency and hematuria.  Musculoskeletal:  Negative for back pain, gait problem, joint swelling and neck pain.  Skin:  Negative for rash.  Neurological:  Negative for dizziness, tremors, speech difficulty and weakness.  Psychiatric/Behavioral:  Negative for agitation, dysphoric mood and sleep disturbance. The patient is not nervous/anxious.    Objective:  BP 138/80 (BP Location: Left Arm)   Pulse 75   Temp 98.4 F (36.9 C) (Oral)   Ht _0  (1.676 m)   Wt 186 lb 3.2 oz (84.5 kg)   SpO2 97%   BMI 30.05 kg/m   BP Readings from Last 3 Encounters:  04/26/21 138/80  01/06/21 (!) 157/92   12/09/20 (!) 162/94    Wt Readings from Last 3 Encounters:  04/26/21 186 lb 3.2 oz (84.5 kg)  01/06/21 187 lb 3.2 oz (84.9 kg)  12/07/20 191 lb 9.6 oz (86.9 kg)    Physical Exam Constitutional:      General: He is not in acute distress.    Appearance: He is well-developed. He is obese.     Comments: NAD  Eyes:     Conjunctiva/sclera: Conjunctivae normal.     Pupils: Pupils are equal, round, and reactive to light.  Neck:     Thyroid: No thyromegaly.     Vascular: No JVD.  Cardiovascular:     Rate and Rhythm: Normal rate and regular rhythm.     Heart sounds: Normal heart sounds. No murmur heard.   No friction rub. No gallop.  Pulmonary:     Effort: Pulmonary effort is normal. No respiratory distress.     Breath sounds: Normal breath sounds. No wheezing or rales.  Chest:     Chest wall: No tenderness.  Abdominal:     General: Bowel sounds are normal. There is no distension.     Palpations: Abdomen is soft. There is no mass.     Tenderness: There is no abdominal tenderness. There is no guarding or rebound.  Musculoskeletal:        General: No tenderness. Normal range of motion.     Cervical back: Normal range of motion.  Lymphadenopathy:     Cervical: No cervical adenopathy.  Skin:    General: Skin is warm and dry.     Findings: No rash.  Neurological:     Mental Status: He is alert and oriented to person, place, and time.     Cranial Nerves: No cranial nerve deficit.     Motor: No abnormal muscle tone.     Coordination: Coordination normal.     Gait: Gait normal.     Deep Tendon Reflexes: Reflexes are normal and symmetric.  Psychiatric:        Behavior: Behavior normal.        Thought Content: Thought content normal.        Judgment: Judgment normal.    Lab Results  Component Value Date   WBC 8.2 03/06/2021   HGB 17.7 (H) 03/06/2021   HCT 51.3 03/06/2021   PLT 132.0 (L) 03/06/2021   GLUCOSE 143 (H) 03/06/2021   CHOL 153 03/06/2021   TRIG 170.0 (H)  03/06/2021   HDL 33.50 (L) 03/06/2021   LDLCALC 86 03/06/2021   ALT 23 03/06/2021   AST 11 03/06/2021   NA 143 03/06/2021   K 3.7 03/06/2021   CL 107 03/06/2021   CREATININE 0.75 03/06/2021   BUN 16 03/06/2021   CO2 28 03/06/2021   TSH 0.64 03/06/2021   PSA 3.87 03/06/2021   INR 1.1 (H) 01/08/2012   HGBA1C 7.6 (H) 03/06/2021   MICROALBUR 1.3 03/06/2021    CT  CARDIAC SCORING (SELF PAY ONLY)  Addendum Date: 01/06/2021   ADDENDUM REPORT: 01/06/2021 14:01 CLINICAL DATA:  Cardiovascular Disease Risk stratification EXAM: Coronary Calcium Score TECHNIQUE: A gated, non-contrast computed tomography scan of the heart was performed using 25m slice thickness. Axial images were analyzed on a dedicated workstation. Calcium scoring of the coronary arteries was performed using the Agatston method. FINDINGS: Coronary arteries: Normal origins. Coronary Calcium Score: Left main: 0 Left anterior descending artery: 20 Left circumflex artery: 0 Right coronary artery: 7.63 Total: 27.6 Percentile: 35% Pericardium: Normal. Ascending Aorta: Mildly dilated at 4cm (measurement taken at the level of the main pulmonary artery bifurcation). Non-cardiac: [facility]. IMPRESSION: Coronary calcium score of 27.6. This was 35th percentile for age-, race-, and sex-matched controls. RECOMMENDATIONS: Coronary artery calcium (CAC) score is a strong predictor of incident coronary heart disease (CHD) and provides predictive information beyond traditional risk factors. CAC scoring is reasonable to use in the decision to withhold, postpone, or initiate statin therapy in intermediate-risk or selected borderline-risk asymptomatic adults (age 67-75years and LDL-C >=70 to <190 mg/dL) who do not have diabetes or established atherosclerotic cardiovascular disease (ASCVD).* In intermediate-risk (10-year ASCVD risk >=7.5% to <20%) adults or selected borderline-risk (10-year ASCVD risk >=5% to <7.5%) adults in  whom a CAC score is measured for the purpose of making a treatment decision the following recommendations have been made: If CAC=0, it is reasonable to withhold statin therapy and reassess in 5 to 10 years, as long as higher risk conditions are absent (diabetes mellitus, family history of premature CHD in first degree relatives (males <55 years; females <65 years), cigarette smoking, or LDL >=190 mg/dL). If CAC is 1 to 99, it is reasonable to initiate statin therapy for patients >=536years of age. If CAC is >=100 or >=75th percentile, it is reasonable to initiate statin therapy at any age. Cardiology referral should be considered for patients with CAC scores >=400 or >=75th percentile. *2018 AHA/ACC/AACVPR/AAPA/ABC/ACPM/ADA/AGS/APhA/ASPC/NLA/PCNA Guideline on the Management of Blood Cholesterol: A Report of the American College of Cardiology/American Heart Association Task Force on Clinical Practice Guidelines. J Am Coll Cardiol. 2019;73(24):3168-3209. TFransico Him MD Electronically Signed   By: TFransico Him  On: 01/06/2021 14:01   Result Date: 01/06/2021 EXAM: OVER-READ INTERPRETATION  CT CHEST The following report is an over-read performed by radiologist Dr. KRolm Baptiseof [facility], PStickneyon 01/05/2021. This over-read does not include interpretation of cardiac or coronary anatomy or pathology. The coronary calcium score interpretation by the cardiologist is attached. COMPARISON:  None. FINDINGS: Vascular: Heart is normal size. Ascending thoracic aorta slightly aneurysmal at 4 cm. Mediastinum/Nodes: No adenopathy Lungs/Pleura: No confluent opacities or effusions Upper Abdomen: Imaging into the upper abdomen demonstrates no acute findings. Musculoskeletal: Chest wall soft tissues are unremarkable. 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 Recommend annual imaging followup by CTA or MRA. This recommendation follows 2010  ACCF/AHA/AATS/ACR/ASA/SCA/SCAI/SIR/STS/SVM Guidelines for the Diagnosis and Management of Patients with Thoracic Aortic Disease. Circulation. 2010; 121:: [id number] Aortic aneurysm NOS (ICD10-I71.9) Electronically Signed: By: KRolm BaptiseM.D. On: 01/05/2021 16:24    Assessment & Plan:     AWalker Kehr MD

## 2021-04-26 NOTE — Assessment & Plan Note (Signed)
No angina Fish oil

## 2021-04-26 NOTE — Patient Instructions (Signed)
Sign up for [employer] [facility] ( via Libby app on your phone or your ipad). If you don't have a library card  - go to any library branch. They will set you up in 15 minutes. It is free. You can check out books to read and to listen, check out magazines and newspapers, movies etc.   Martha Stout "[facility] [employer]"    

## 2021-04-26 NOTE — Assessment & Plan Note (Signed)
S/p surgery 12/09/20: 1.  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 2.  Transurethral biopsy of a right proximal prostatic lesion.   Preop diagnosis: Hematuria with erythematous bladder lesion on office cystoscopy and possible bladder stone.   Postop diagnosis: 3 cm anterior bladder wall neoplasm with dystrophic calcification, 1.5 cm right anterior lateral wall bladder neoplasm and 1.5 cm mucosal lesion of the right posterior proximal prostatic urethra.   Surgeon: Dr. Irine Seal.

## 2021-04-26 NOTE — Assessment & Plan Note (Signed)
Start Fish oil

## 2021-05-19 DIAGNOSIS — N401 Enlarged prostate with lower urinary tract symptoms: Secondary | ICD-10-CM | POA: Diagnosis not present

## 2021-05-19 DIAGNOSIS — R3915 Urgency of urination: Secondary | ICD-10-CM | POA: Diagnosis not present

## 2021-05-19 DIAGNOSIS — N210 Calculus in bladder: Secondary | ICD-10-CM | POA: Diagnosis not present

## 2021-05-19 DIAGNOSIS — Z8551 Personal history of malignant neoplasm of bladder: Secondary | ICD-10-CM | POA: Diagnosis not present

## 2021-07-24 DIAGNOSIS — N401 Enlarged prostate with lower urinary tract symptoms: Secondary | ICD-10-CM | POA: Diagnosis not present

## 2021-07-24 DIAGNOSIS — R3915 Urgency of urination: Secondary | ICD-10-CM | POA: Diagnosis not present

## 2021-07-24 DIAGNOSIS — Z8551 Personal history of malignant neoplasm of bladder: Secondary | ICD-10-CM | POA: Diagnosis not present

## 2021-07-31 ENCOUNTER — Ambulatory Visit: Payer: BC Managed Care – PPO | Admitting: Internal Medicine

## 2021-08-03 ENCOUNTER — Other Ambulatory Visit: Payer: Self-pay

## 2021-08-03 ENCOUNTER — Encounter: Payer: Self-pay | Admitting: Internal Medicine

## 2021-08-03 ENCOUNTER — Ambulatory Visit: Payer: BC Managed Care – PPO | Admitting: Internal Medicine

## 2021-08-03 DIAGNOSIS — E118 Type 2 diabetes mellitus with unspecified complications: Secondary | ICD-10-CM

## 2021-08-03 DIAGNOSIS — I2510 Atherosclerotic heart disease of native coronary artery without angina pectoris: Secondary | ICD-10-CM | POA: Diagnosis not present

## 2021-08-03 DIAGNOSIS — L0201 Cutaneous abscess of face: Secondary | ICD-10-CM | POA: Diagnosis not present

## 2021-08-03 DIAGNOSIS — E785 Hyperlipidemia, unspecified: Secondary | ICD-10-CM | POA: Diagnosis not present

## 2021-08-03 DIAGNOSIS — I2583 Coronary atherosclerosis due to lipid rich plaque: Secondary | ICD-10-CM

## 2021-08-03 DIAGNOSIS — Z5320 Procedure and treatment not carried out because of patient's decision for unspecified reasons: Secondary | ICD-10-CM

## 2021-08-03 MED ORDER — SYNJARDY XR 12.5-1000 MG PO TB24: 1.0000 | ORAL_TABLET | Freq: Two times a day (BID) | ORAL | 3 refills | Status: DC

## 2021-08-03 MED ORDER — DOXYCYCLINE HYCLATE 100 MG PO TABS: 100.0000 mg | ORAL_TABLET | Freq: Two times a day (BID) | ORAL | 0 refills | Status: DC

## 2021-08-03 NOTE — Assessment & Plan Note (Signed)
Cont w/Fish oil

## 2021-08-03 NOTE — Progress Notes (Signed)
Subjective:  Patient ID: [name] [name], male    DOB: 08-Dec-1955  Age: 65 y.o. MRN: [id number]  CC: Cyst (On (L) eye)   HPI ANDRA MATSUO presents for DM. C/o stye (per patient) on the L eye of several days duration-worsening swelling and pain  Outpatient Medications Prior to Visit  Medication Sig Dispense Refill   amLODipine (NORVASC) 5 MG tablet TAKE 1 TABLET BY MOUTH EVERY DAY (Patient taking differently: Take 5 mg by mouth daily.) 90 tablet 3   Ascorbic Acid (VITAMIN C) 1000 MG tablet Take 1,000 mg by mouth daily as needed (immunine).     blood glucose meter kit and supplies KIT Use to test blood sugar once a day. DX: E11.9 1 each 0   Blood Glucose Monitoring Suppl (CONTOUR NEXT MONITOR) w/Device KIT 1 Device by Does not apply route daily. 1 kit 0   cholecalciferol (VITAMIN D) 1000 UNITS tablet Take 1,000 Units by mouth daily.     CONTOUR NEXT TEST test strip USE AS DIRECTED 100 strip 5   finasteride (PROSCAR) 5 MG tablet TAKE 1 TABLET BY MOUTH EVERY DAY (Patient taking differently: Take 5 mg by mouth daily.) 90 tablet 3   Lancets MISC Use to check blood sugar once per day. 100 each 3   olmesartan (BENICAR) 40 MG tablet Take 1 tablet (40 mg total) by mouth daily. (Patient taking differently: Take 40 mg by mouth daily.) 90 tablet 3   potassium chloride SA (KLOR-CON) 20 MEQ tablet Take 1 tablet (20 mEq total) by mouth daily. 90 tablet 11   repaglinide (PRANDIN) 2 MG tablet TAKE 1 TABLET BY MOUTH 3 TIMES A DAY BEFORE MEALS 270 tablet 3   tadalafil (CIALIS) 5 MG tablet Take 5 mg by mouth daily.     Empagliflozin-metFORMIN HCl ER (SYNJARDY XR) 12.02-999 MG TB24 Take 1 tablet by mouth 2 (two) times daily. (Patient taking differently: Take 1 tablet by mouth 2 (two) times daily.) 180 tablet 3   No facility-administered medications prior to visit.    ROS: Review of Systems  Constitutional:  Negative for appetite change, fatigue and unexpected weight change.  HENT:  Negative for  congestion, nosebleeds, sneezing, sore throat and trouble swallowing.   Eyes:  Positive for pain. Negative for redness, itching and visual disturbance.  Respiratory:  Negative for cough.   Cardiovascular:  Negative for chest pain, palpitations and leg swelling.  Gastrointestinal:  Negative for abdominal distention, blood in stool, diarrhea and nausea.  Genitourinary:  Negative for frequency and hematuria.  Musculoskeletal:  Negative for back pain, gait problem, joint swelling and neck pain.  Skin:  Negative for rash.  Neurological:  Negative for dizziness, tremors, speech difficulty and weakness.  Psychiatric/Behavioral:  Negative for agitation, dysphoric mood and sleep disturbance. The patient is not nervous/anxious.    Objective:  BP 138/68 (BP Location: Left Arm)   Pulse 66   Temp 98.3 F (36.8 C) (Oral)   Ht 5' 6" (1.676 m)   Wt 190 lb 9.6 oz (86.5 kg)   SpO2 97%   BMI 30.76 kg/m   BP Readings from Last 3 Encounters:  08/03/21 138/68  04/26/21 138/80  01/06/21 (!) 157/92    Wt Readings from Last 3 Encounters:  08/03/21 190 lb 9.6 oz (86.5 kg)  04/26/21 186 lb 3.2 oz (84.5 kg)  01/06/21 187 lb 3.2 oz (84.9 kg)    Physical Exam Constitutional:      General: He is not in acute distress.  Appearance: He is well-developed.     Comments: NAD  Eyes:     Conjunctiva/sclera: Conjunctivae normal.     Pupils: Pupils are equal, round, and reactive to light.  Neck:     Thyroid: No thyromegaly.     Vascular: No JVD.  Cardiovascular:     Rate and Rhythm: Normal rate and regular rhythm.     Heart sounds: Normal heart sounds. No murmur heard.   No friction rub. No gallop.  Pulmonary:     Effort: Pulmonary effort is normal. No respiratory distress.     Breath sounds: Normal breath sounds. No wheezing or rales.  Chest:     Chest wall: No tenderness.  Abdominal:     General: Bowel sounds are normal. There is no distension.     Palpations: Abdomen is soft. There is no mass.      Tenderness: There is no abdominal tenderness. There is no guarding or rebound.  Musculoskeletal:        General: No tenderness. Normal range of motion.     Cervical back: Normal range of motion.  Lymphadenopathy:     Cervical: No cervical adenopathy.  Skin:    General: Skin is warm and dry.     Findings: Erythema and lesion present. No rash.  Neurological:     Mental Status: He is alert and oriented to person, place, and time.     Cranial Nerves: No cranial nerve deficit.     Motor: No abnormal muscle tone.     Coordination: Coordination normal.     Gait: Gait normal.     Deep Tendon Reflexes: Reflexes are normal and symmetric.  Psychiatric:        Behavior: Behavior normal.        Thought Content: Thought content normal.        Judgment: Judgment normal.   There is 6 x 8 mm erythematous abscess on the left lower eyelid    Procedure note:  Incision and Drainage of an Abscess   Indication : a localized collection of pus or blood that is tender and not spontaneously resolving.    Risks including unsuccessful procedure , possible need for a repeat procedure due to pus accumulation, scar formation, and others as well as benefits were explained to the patient in detail.  Verbal consent was obtained   The patient was placed in a decubitus position. The area of an abscess was prepped with povidone-iodine and draped in a sterile fashion. Local anesthesia was not administered.  0.6 cm incision with #11strait blade was made. About 0.5 cc of pus-like material was expressed.  The wound was dressed with antibiotic ointment and a Band-Aid.  Tolerated well. Complications: None.   Wound instructions provided.   Wound instructions : change dressing once a day or twice a day if needed. Change dressing after  shower in the morning.  Pat dry the wound with gauze.a Band-Aid of appropriate size.   Please contact us if you notice a recollection of pus in the abscess fever and chills increased  pain redness red streaks near the abscess increased swelling in the area  Lab Results  Component Value Date   WBC 8.2 03/06/2021   HGB 17.7 (H) 03/06/2021   HCT 51.3 03/06/2021   PLT 132.0 (L) 03/06/2021   GLUCOSE 143 (H) 03/06/2021   CHOL 153 03/06/2021   TRIG 170.0 (H) 03/06/2021   HDL 33.50 (L) 03/06/2021   LDLCALC 86 03/06/2021   ALT 23 03/06/2021   AST  11 03/06/2021   NA 143 03/06/2021   K 3.7 03/06/2021   CL 107 03/06/2021   CREATININE 0.75 03/06/2021   BUN 16 03/06/2021   CO2 28 03/06/2021   TSH 0.64 03/06/2021   PSA 3.87 03/06/2021   INR 1.1 (H) 01/08/2012   HGBA1C 7.6 (H) 03/06/2021   MICROALBUR 1.3 03/06/2021    CT CARDIAC SCORING (SELF PAY ONLY)  Addendum Date: 01/06/2021   ADDENDUM REPORT: 01/06/2021 14:01 CLINICAL DATA:  Cardiovascular Disease Risk stratification EXAM: Coronary Calcium Score TECHNIQUE: A gated, non-contrast computed tomography scan of the heart was performed using 77m slice thickness. Axial images were analyzed on a dedicated workstation. Calcium scoring of the coronary arteries was performed using the Agatston method. FINDINGS: Coronary arteries: Normal origins. Coronary Calcium Score: Left main: 0 Left anterior descending artery: 20 Left circumflex artery: 0 Right coronary artery: 7.63 Total: 27.6 Percentile: 35% Pericardium: Normal. Ascending Aorta: Mildly dilated at 4cm (measurement taken at the level of the main pulmonary artery bifurcation). [facility]-cardiac: [facility]. IMPRESSION: Coronary calcium score of 27.6. This was 35th percentile for age-, race-, and sex-matched controls. RECOMMENDATIONS: Coronary artery calcium (CAC) score is a strong predictor of incident coronary heart disease (CHD) and provides predictive information beyond traditional risk factors. CAC scoring is reasonable to use in the decision to withhold, postpone, or initiate statin therapy in intermediate-risk or selected borderline-risk asymptomatic  adults (age 67-75years and LDL-C >=70 to <190 mg/dL) who do not have diabetes or established atherosclerotic cardiovascular disease (ASCVD).* In intermediate-risk (10-year ASCVD risk >=7.5% to <20%) adults or selected borderline-risk (10-year ASCVD risk >=5% to <7.5%) adults in whom a CAC score is measured for the purpose of making a treatment decision the following recommendations have been made: If CAC=0, it is reasonable to withhold statin therapy and reassess in 5 to 10 years, as long as higher risk conditions are absent (diabetes mellitus, family history of premature CHD in first degree relatives (males <55 years; females <65 years), cigarette smoking, or LDL >=190 mg/dL). If CAC is 1 to 99, it is reasonable to initiate statin therapy for patients >=567years of age. If CAC is >=100 or >=75th percentile, it is reasonable to initiate statin therapy at any age. Cardiology referral should be considered for patients with CAC scores >=400 or >=75th percentile. *2018 AHA/ACC/AACVPR/AAPA/ABC/ACPM/ADA/AGS/APhA/ASPC/NLA/PCNA Guideline on the Management of Blood Cholesterol: A Report of the American College of Cardiology/American Heart Association Task Force on Clinical Practice Guidelines. J Am Coll Cardiol. 2019;73(24):3168-3209. TFransico Him MD Electronically Signed   By: TFransico Him  On: 01/06/2021 14:01   Result Date: 01/06/2021 EXAM: OVER-READ INTERPRETATION  CT CHEST The following report is an over-read performed by radiologist Dr. KRolm Baptiseof [facility], [place] 01/05/2021. This over-read does not include interpretation of cardiac or coronary anatomy or pathology. The coronary calcium score interpretation by the cardiologist is attached. COMPARISON:  None. FINDINGS: Vascular: Heart is normal size. Ascending thoracic aorta slightly aneurysmal at 4 cm. Mediastinum/Nodes: No adenopathy Lungs/Pleura: No confluent opacities or effusions Upper Abdomen: Imaging into the upper abdomen demonstrates no  acute findings. Musculoskeletal: Chest wall soft tissues are unremarkable. 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 Recommend annual imaging followup by CTA or MRA. This recommendation follows 2010 ACCF/AHA/AATS/ACR/ASA/SCA/SCAI/SIR/STS/SVM Guidelines for the Diagnosis and Management of Patients with Thoracic Aortic Disease. Circulation. 2010; 121:: K938-H829 Aortic aneurysm NOS (ICD10-I71.9) Electronically Signed: By: KRolm BaptiseM.D. On: 01/05/2021  16:24    Assessment & Plan:   Problem List Items Addressed This Visit     Acute abscess of face    See procedure Doxy po x 7 d      Coronary atherosclerosis    Cont w/Fish oil      Relevant Orders   Comprehensive metabolic panel   Hemoglobin A1c   CBC with Differential/Platelet   DM type 2, controlled, with complication (HCC)    Continue w/Synjardy, Prandin Check A1c      Relevant Medications   Empagliflozin-metFORMIN HCl ER (SYNJARDY XR) 12.02-999 MG TB24   Other Relevant Orders   Comprehensive metabolic panel   Hemoglobin A1c   CBC with Differential/Platelet   Dyslipidemia    The patient declined statins      Statin declined    Patient declined         Meds ordered this encounter  Medications   Empagliflozin-metFORMIN HCl ER (SYNJARDY XR) 12.02-999 MG TB24    Sig: Take 1 tablet by mouth 2 (two) times daily.    Dispense:  180 tablet    Refill:  3   doxycycline (VIBRA-TABS) 100 MG tablet    Sig: Take 1 tablet (100 mg total) by mouth 2 (two) times daily.    Dispense:  14 tablet    Refill:  0       Follow-up: No follow-ups on file.  Walker Kehr, MD

## 2021-08-03 NOTE — Assessment & Plan Note (Signed)
See procedure Doxy po x 7 d

## 2021-08-03 NOTE — Assessment & Plan Note (Signed)
Continue w/Synjardy, Prandin Check A1c

## 2021-08-06 ENCOUNTER — Encounter: Payer: Self-pay | Admitting: Internal Medicine

## 2021-08-06 DIAGNOSIS — Z5320 Procedure and treatment not carried out because of patient's decision for unspecified reasons: Secondary | ICD-10-CM | POA: Insufficient documentation

## 2021-08-06 NOTE — Assessment & Plan Note (Signed)
Patient declined  

## 2021-08-06 NOTE — Assessment & Plan Note (Signed)
The patient declined statins

## 2021-08-16 DIAGNOSIS — R3915 Urgency of urination: Secondary | ICD-10-CM | POA: Diagnosis not present

## 2021-08-16 DIAGNOSIS — N401 Enlarged prostate with lower urinary tract symptoms: Secondary | ICD-10-CM | POA: Diagnosis not present

## 2021-08-16 DIAGNOSIS — Z8551 Personal history of malignant neoplasm of bladder: Secondary | ICD-10-CM | POA: Diagnosis not present

## 2021-08-21 DIAGNOSIS — Z8551 Personal history of malignant neoplasm of bladder: Secondary | ICD-10-CM | POA: Diagnosis not present

## 2021-08-23 DIAGNOSIS — R3915 Urgency of urination: Secondary | ICD-10-CM | POA: Diagnosis not present

## 2021-08-23 DIAGNOSIS — C671 Malignant neoplasm of dome of bladder: Secondary | ICD-10-CM | POA: Diagnosis not present

## 2021-08-23 DIAGNOSIS — C672 Malignant neoplasm of lateral wall of bladder: Secondary | ICD-10-CM | POA: Diagnosis not present

## 2021-08-23 DIAGNOSIS — N401 Enlarged prostate with lower urinary tract symptoms: Secondary | ICD-10-CM | POA: Diagnosis not present

## 2021-08-25 ENCOUNTER — Other Ambulatory Visit: Payer: Self-pay | Admitting: Urology

## 2021-08-26 IMAGING — CT CT CARDIAC CORONARY ARTERY CALCIUM SCORE
3 series · 14 of 20 positions shown, 15 images · non-contrast
Comparison: None.
COMPARISON: None.

Addendum:
EXAM:
OVER-READ INTERPRETATION  CT CHEST

The following report is an over-read performed by radiologist Dr.
Indriti Napoles [REDACTED] on 01/05/2021. This over-read
does not include interpretation of cardiac or coronary anatomy or
pathology. The coronary calcium score interpretation by the
cardiologist is attached.
CLINICAL DATA: Cardiovascular Disease Risk stratification
Coronary Calcium Score
TECHNIQUE: A gated, non-contrast computed tomography scan of the heart was
performed using 3mm slice thickness. Axial images were analyzed on a
dedicated workstation. Calcium scoring of the coronary arteries was
performed using the Agatston method.

[Series 2: casc 3.0 bv41 2 bestdiast 73 % · axial · 0.39mm/px · z∈[-254,-182]mm · 4 of 40 slices shown, 5 images]
[im 8/40  vessel]
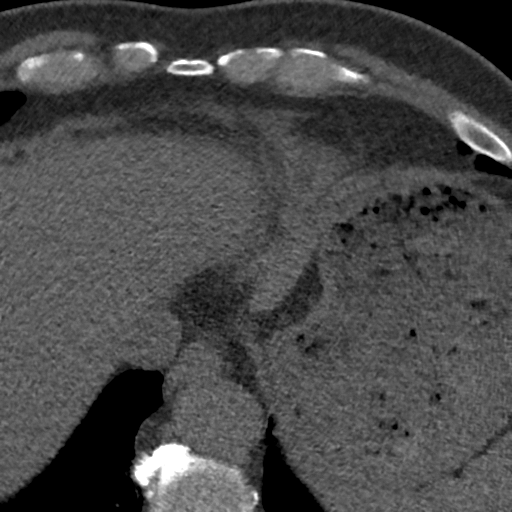
[im 8/40  lung]
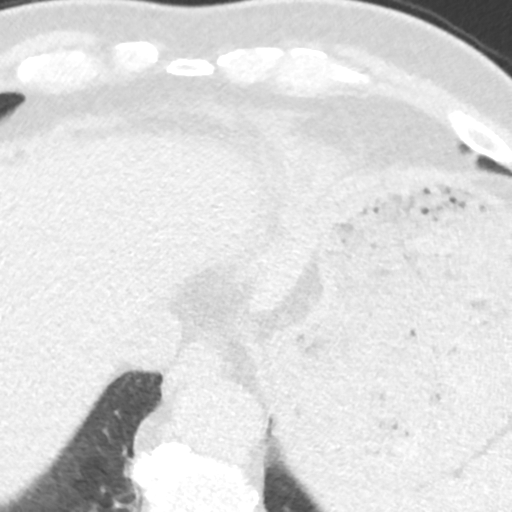
[im 16/40  vessel]
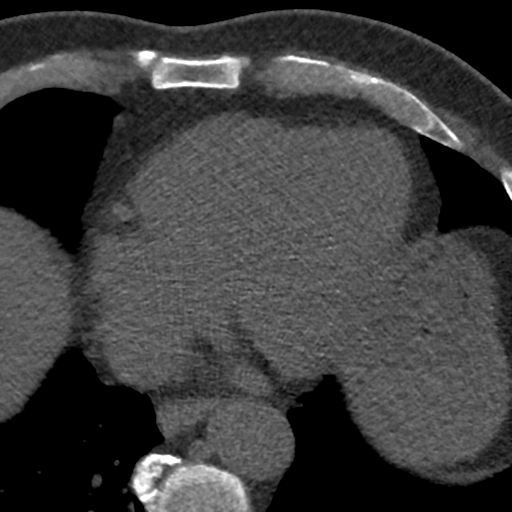
[im 24/40  vessel]
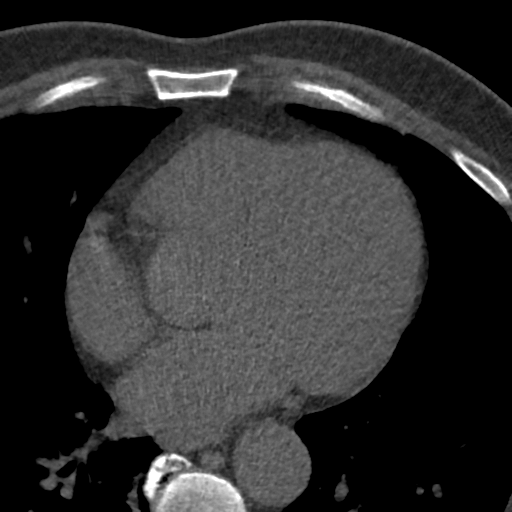
[im 32/40  vessel]
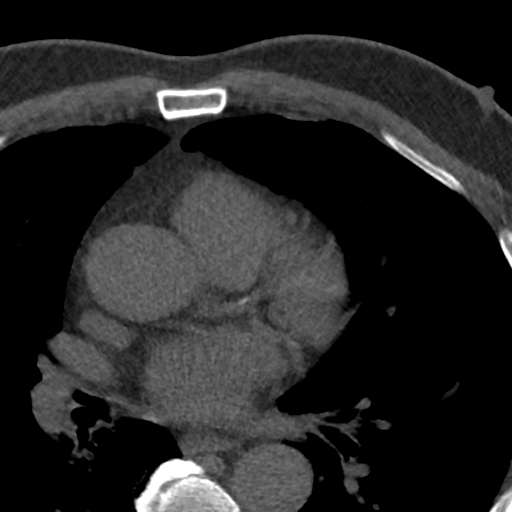

[Series 3: lung 71 % · axial · 0.71mm/px · z∈[-258,-176]mm · 5 of 41 slices shown]
[im 7/41  lung]
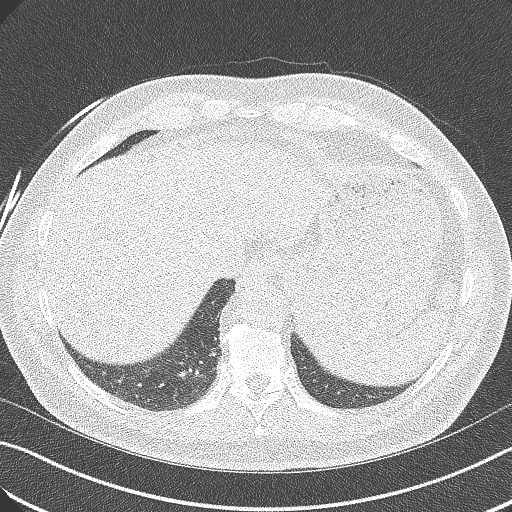
[im 14/41  lung]
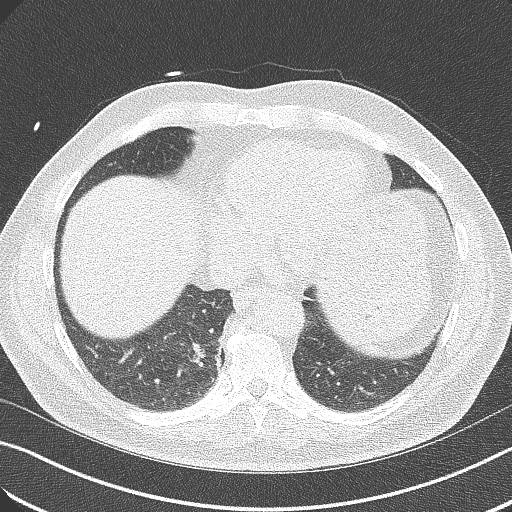
[im 21/41  lung]
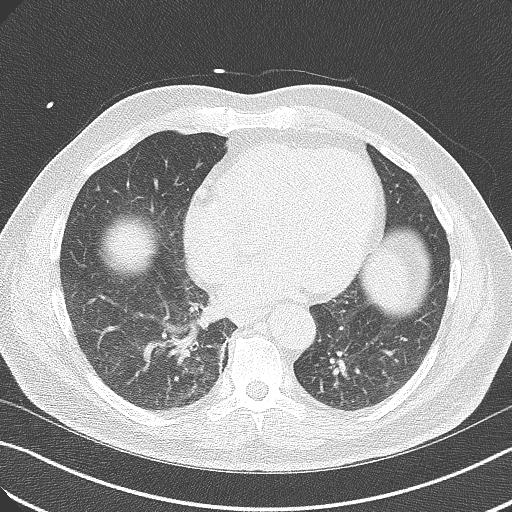
[im 27/41  lung]
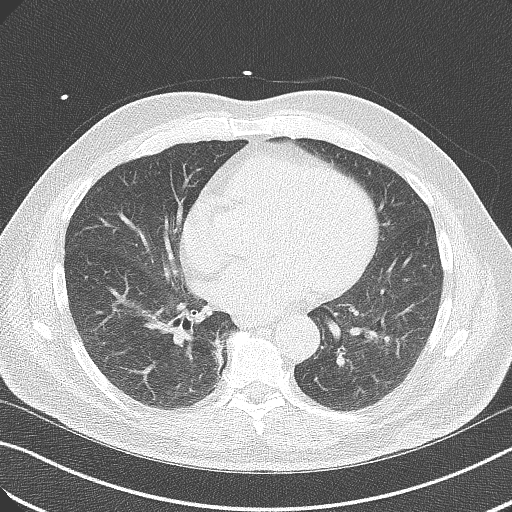
[im 34/41  lung]
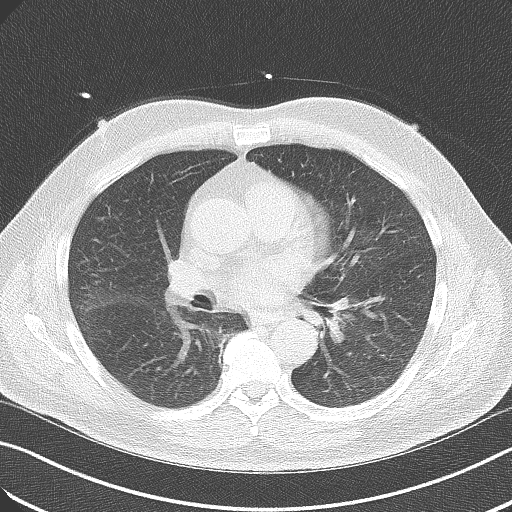

[Series 4: lung st 71 % · axial · 0.71mm/px · z∈[-258,-176]mm · 5 of 41 slices shown]
[im 7/41  lung]
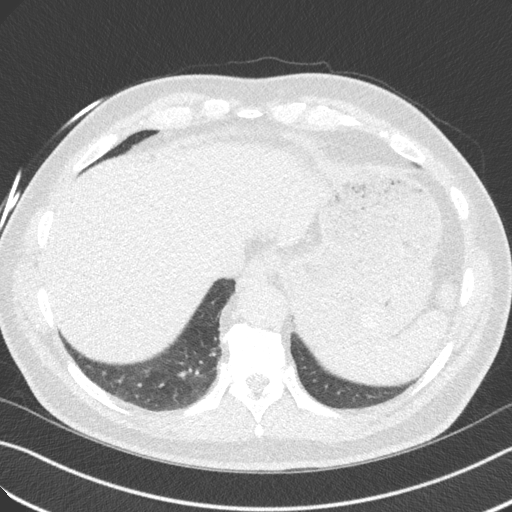
[im 14/41  lung]
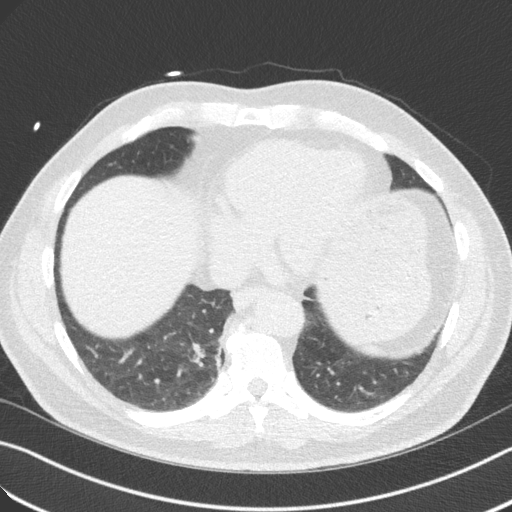
[im 21/41  lung]
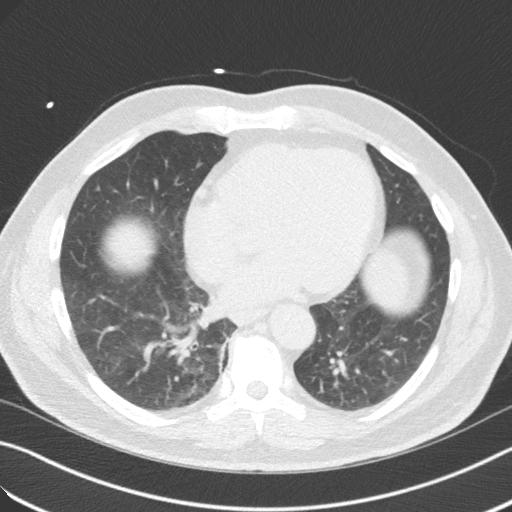
[im 27/41  lung]
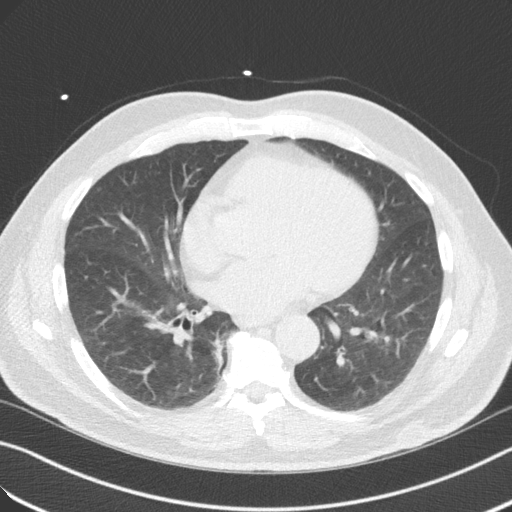
[im 34/41  lung]
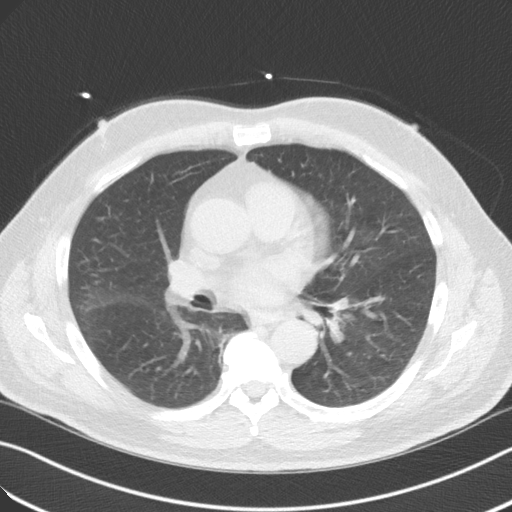

[14 of 20 positions shown; findings below may reference images not displayed]

FINDINGS: Vascular: Heart is normal size. Ascending thoracic aorta slightly
aneurysmal at 4 cm.

Mediastinum/Nodes: No adenopathy

Lungs/Pleura: No confluent opacities or effusions

Upper Abdomen: Imaging into the upper abdomen demonstrates no acute
findings.

Musculoskeletal: Chest wall soft tissues are unremarkable. No acute
bony abnormality
IMPRESSION: Early aneurysmal dilatation of the ascending thoracic aorta, 4 cm.
Recommend annual imaging followup by CTA or MRA. This recommendation
follows 1707 ACCF/AHA/AATS/ACR/ASA/SCA/WOLDE/RUDI/DSILVA/SHEIKHA Guidelines
for the Diagnosis and Management of Patients with Thoracic Aortic
Disease. Circulation. 1707; 121: E266-e369. Aortic aneurysm NOS
(NY71U-K6X.1)
FINDINGS: Coronary arteries: Normal origins.

Coronary Calcium Score:

Left main: 0

Left anterior descending artery: 20

Left circumflex artery: 0

Right coronary artery:

Total:

Percentile: 35%

Pericardium: Normal.

Ascending Aorta: Mildly dilated at 4cm (measurement taken at the
level of the main pulmonary artery bifurcation).

Non-cardiac: See separate report from [REDACTED].
IMPRESSION: Coronary calcium score of 27.6. This was 35th percentile for age-,
race-, and sex-matched controls.



If CAC=0, it is reasonable to withhold statin therapy and reassess
in 5 to 10 years, as long as higher risk conditions are absent
(diabetes mellitus, family history of premature CHD in first degree
relatives (males <55 years; females <65 years), cigarette smoking,
or LDL >=190 mg/dL).

If CAC is 1 to 99, it is reasonable to initiate statin therapy for
patients >=55 years of age.

If CAC is >=100 or >=75th percentile, it is reasonable to initiate
statin therapy at any age.

Cardiology referral should be considered for patients with CAC
scores >=400 or >=75th percentile.

*3459 AHA/ACC/AACVPR/AAPA/ABC/JACKELIN/DANYY/MARYIANA/Giorgi/REINHARD/KAHIMBI MERCY/BHEBHE
Guideline on the Management of Blood Cholesterol: A Report of the
American College of Cardiology/American Heart Association Task Force
on Clinical Practice Guidelines. J Am Coll Cardiol.
0279;73(24):4661-4907.

*** End of Addendum ***
EXAM:
OVER-READ INTERPRETATION  CT CHEST

The following report is an over-read performed by radiologist Dr.
Indriti Napoles [REDACTED] on 01/05/2021. This over-read
does not include interpretation of cardiac or coronary anatomy or
pathology. The coronary calcium score interpretation by the
cardiologist is attached.
FINDINGS: Vascular: Heart is normal size. Ascending thoracic aorta slightly
aneurysmal at 4 cm.

Mediastinum/Nodes: No adenopathy

Lungs/Pleura: No confluent opacities or effusions

Upper Abdomen: Imaging into the upper abdomen demonstrates no acute
findings.

Musculoskeletal: Chest wall soft tissues are unremarkable. No acute
bony abnormality
IMPRESSION: Early aneurysmal dilatation of the ascending thoracic aorta, 4 cm.
Recommend annual imaging followup by CTA or MRA. This recommendation
follows 1707 ACCF/AHA/AATS/ACR/ASA/SCA/WOLDE/RUDI/DSILVA/SHEIKHA Guidelines
for the Diagnosis and Management of Patients with Thoracic Aortic
Disease. Circulation. 1707; 121: E266-e369. Aortic aneurysm NOS
(NY71U-K6X.1)

## 2021-08-29 ENCOUNTER — Ambulatory Visit: Payer: BC Managed Care – PPO | Admitting: Internal Medicine

## 2021-08-31 ENCOUNTER — Encounter (HOSPITAL_BASED_OUTPATIENT_CLINIC_OR_DEPARTMENT_OTHER): Payer: Self-pay | Admitting: Urology

## 2021-08-31 NOTE — Progress Notes (Signed)
Unable to reach pt after multiple attempts by phone.  Left voice message for pt to arrive at 1000 on 09-01-2021 by npo after midnight tonight but may have only clear liquids until 0900 then nothing by mouth. Only take norvasc and proscar morning of surgery but no not take diabetic mediations morning of surgery. Pt will need ISTAT.  Pt has current ekg in epic/ chart. Pt medical history and med list will need to be updated.

## 2021-09-01 ENCOUNTER — Ambulatory Visit (HOSPITAL_BASED_OUTPATIENT_CLINIC_OR_DEPARTMENT_OTHER): Payer: BC Managed Care – PPO | Admitting: Anesthesiology

## 2021-09-01 ENCOUNTER — Ambulatory Visit (HOSPITAL_BASED_OUTPATIENT_CLINIC_OR_DEPARTMENT_OTHER)
Admission: RE | Admit: 2021-09-01 | Discharge: 2021-09-01 | Disposition: A | Discharge status: Home / Self Care | Payer: BC Managed Care – PPO | Attending: Urology | Admitting: Urology

## 2021-09-01 ENCOUNTER — Encounter (HOSPITAL_BASED_OUTPATIENT_CLINIC_OR_DEPARTMENT_OTHER)
Admission: RE | Disposition: A | Discharge status: Home / Self Care | Payer: Self-pay | Source: Home / Self Care | Attending: Urology

## 2021-09-01 ENCOUNTER — Encounter (HOSPITAL_BASED_OUTPATIENT_CLINIC_OR_DEPARTMENT_OTHER): Payer: Self-pay | Admitting: Urology

## 2021-09-01 ENCOUNTER — Other Ambulatory Visit: Payer: Self-pay

## 2021-09-01 DIAGNOSIS — N401 Enlarged prostate with lower urinary tract symptoms: Secondary | ICD-10-CM | POA: Diagnosis not present

## 2021-09-01 DIAGNOSIS — R3915 Urgency of urination: Secondary | ICD-10-CM | POA: Diagnosis not present

## 2021-09-01 DIAGNOSIS — E1151 Type 2 diabetes mellitus with diabetic peripheral angiopathy without gangrene: Secondary | ICD-10-CM | POA: Insufficient documentation

## 2021-09-01 DIAGNOSIS — F17200 Nicotine dependence, unspecified, uncomplicated: Secondary | ICD-10-CM | POA: Insufficient documentation

## 2021-09-01 DIAGNOSIS — E876 Hypokalemia: Secondary | ICD-10-CM | POA: Diagnosis not present

## 2021-09-01 DIAGNOSIS — C671 Malignant neoplasm of dome of bladder: Secondary | ICD-10-CM | POA: Diagnosis not present

## 2021-09-01 DIAGNOSIS — N138 Other obstructive and reflux uropathy: Secondary | ICD-10-CM | POA: Diagnosis not present

## 2021-09-01 DIAGNOSIS — N3289 Other specified disorders of bladder: Secondary | ICD-10-CM | POA: Diagnosis not present

## 2021-09-01 DIAGNOSIS — N329 Bladder disorder, unspecified: Secondary | ICD-10-CM | POA: Diagnosis not present

## 2021-09-01 DIAGNOSIS — R3914 Feeling of incomplete bladder emptying: Secondary | ICD-10-CM | POA: Insufficient documentation

## 2021-09-01 DIAGNOSIS — C672 Malignant neoplasm of lateral wall of bladder: Secondary | ICD-10-CM | POA: Diagnosis not present

## 2021-09-01 DIAGNOSIS — N210 Calculus in bladder: Secondary | ICD-10-CM | POA: Diagnosis not present

## 2021-09-01 DIAGNOSIS — D090 Carcinoma in situ of bladder: Secondary | ICD-10-CM | POA: Diagnosis not present

## 2021-09-01 DIAGNOSIS — I7111 Aneurysm of the ascending aorta, ruptured: Secondary | ICD-10-CM | POA: Diagnosis not present

## 2021-09-01 DIAGNOSIS — I10 Essential (primary) hypertension: Secondary | ICD-10-CM | POA: Insufficient documentation

## 2021-09-01 DIAGNOSIS — C670 Malignant neoplasm of trigone of bladder: Secondary | ICD-10-CM | POA: Insufficient documentation

## 2021-09-01 DIAGNOSIS — C678 Malignant neoplasm of overlapping sites of bladder: Secondary | ICD-10-CM | POA: Diagnosis not present

## 2021-09-01 HISTORY — PX: TRANSURETHRAL RESECTION OF BLADDER TUMOR WITH MITOMYCIN-C: SHX6459

## 2021-09-01 LAB — POCT I-STAT, CHEM 8
BUN: 19 mg/dL (ref 8–23)
Calcium, Ion: 1.17 mmol/L (ref 1.15–1.40)
Chloride: 108 mmol/L (ref 98–111)
Creatinine, Ser: 0.6 mg/dL — ABNORMAL LOW (ref 0.61–1.24)
Glucose, Bld: 171 mg/dL — ABNORMAL HIGH (ref 70–99)
HCT: 53 % — ABNORMAL HIGH (ref 39.0–52.0)
Hemoglobin: 18 g/dL — ABNORMAL HIGH (ref 13.0–17.0)
Potassium: 3.6 mmol/L (ref 3.5–5.1)
Sodium: 142 mmol/L (ref 135–145)
TCO2: 23 mmol/L (ref 22–32)

## 2021-09-01 LAB — GLUCOSE, CAPILLARY: Glucose-Capillary: 124 mg/dL — ABNORMAL HIGH (ref 70–99)

## 2021-09-01 SURGERY — TRANSURETHRAL RESECTION OF BLADDER TUMOR WITH MITOMYCIN-C
Anesthesia: General | Site: Bladder

## 2021-09-01 MED ORDER — HYDROCODONE-ACETAMINOPHEN 5-325 MG PO TABS: 1.0000 | ORAL_TABLET | Freq: Four times a day (QID) | ORAL | 0 refills | Status: DC | PRN

## 2021-09-01 MED ORDER — GEMCITABINE CHEMO FOR BLADDER INSTILLATION 2000 MG
2000.0000 mg | Freq: Once | INTRAVENOUS | Status: AC
  Administered 2021-09-01: 2000 mg via INTRAVESICAL
  Filled 2021-09-01: qty 2000

## 2021-09-01 MED ORDER — PROPOFOL 10 MG/ML IV BOLUS
INTRAVENOUS | Status: DC | PRN
  Administered 2021-09-01: 200 mg via INTRAVENOUS

## 2021-09-01 MED ORDER — SODIUM CHLORIDE 0.9 % IV SOLN: 250.0000 mL | INTRAVENOUS | Status: DC | PRN

## 2021-09-01 MED ORDER — CEFAZOLIN SODIUM-DEXTROSE 2-4 GM/100ML-% IV SOLN
2.0000 g | INTRAVENOUS | Status: AC
  Administered 2021-09-01: 2 g via INTRAVENOUS

## 2021-09-01 MED ORDER — SUGAMMADEX SODIUM 200 MG/2ML IV SOLN
INTRAVENOUS | Status: DC | PRN
  Administered 2021-09-01: 200 mg via INTRAVENOUS

## 2021-09-01 MED ORDER — DEXMEDETOMIDINE (PRECEDEX) IN NS 20 MCG/5ML (4 MCG/ML) IV SYRINGE
PREFILLED_SYRINGE | INTRAVENOUS | Status: DC | PRN
  Administered 2021-09-01: 8 ug via INTRAVENOUS

## 2021-09-01 MED ORDER — ONDANSETRON HCL 4 MG/2ML IJ SOLN
INTRAMUSCULAR | Status: DC | PRN
  Administered 2021-09-01: 4 mg via INTRAVENOUS

## 2021-09-01 MED ORDER — FENTANYL CITRATE (PF) 100 MCG/2ML IJ SOLN: 25.0000 ug | INTRAMUSCULAR | Status: DC | PRN

## 2021-09-01 MED ORDER — EPHEDRINE 5 MG/ML INJ
INTRAVENOUS | Status: AC
  Filled 2021-09-01: qty 5

## 2021-09-01 MED ORDER — PROPOFOL 10 MG/ML IV BOLUS
INTRAVENOUS | Status: AC
  Filled 2021-09-01: qty 40

## 2021-09-01 MED ORDER — ACETAMINOPHEN 10 MG/ML IV SOLN: 1000.0000 mg | Freq: Once | INTRAVENOUS | Status: DC | PRN

## 2021-09-01 MED ORDER — SUCCINYLCHOLINE CHLORIDE 200 MG/10ML IV SOSY
PREFILLED_SYRINGE | INTRAVENOUS | Status: DC | PRN
  Administered 2021-09-01: 140 mg via INTRAVENOUS

## 2021-09-01 MED ORDER — STERILE WATER FOR IRRIGATION IR SOLN
Status: DC | PRN
  Administered 2021-09-01: 500 mL

## 2021-09-01 MED ORDER — ROCURONIUM BROMIDE 10 MG/ML (PF) SYRINGE
PREFILLED_SYRINGE | INTRAVENOUS | Status: AC
  Filled 2021-09-01: qty 10

## 2021-09-01 MED ORDER — SODIUM CHLORIDE 0.9 % IR SOLN
Status: DC | PRN
  Administered 2021-09-01: 3000 mL
  Administered 2021-09-01: 6000 mL

## 2021-09-01 MED ORDER — ONDANSETRON HCL 4 MG/2ML IJ SOLN
INTRAMUSCULAR | Status: AC
  Filled 2021-09-01: qty 2

## 2021-09-01 MED ORDER — DEXMEDETOMIDINE (PRECEDEX) IN NS 20 MCG/5ML (4 MCG/ML) IV SYRINGE
PREFILLED_SYRINGE | INTRAVENOUS | Status: AC
  Filled 2021-09-01: qty 5

## 2021-09-01 MED ORDER — SUCCINYLCHOLINE CHLORIDE 200 MG/10ML IV SOSY
PREFILLED_SYRINGE | INTRAVENOUS | Status: AC
  Filled 2021-09-01: qty 10

## 2021-09-01 MED ORDER — FENTANYL CITRATE (PF) 100 MCG/2ML IJ SOLN
INTRAMUSCULAR | Status: DC | PRN
  Administered 2021-09-01: 50 ug via INTRAVENOUS
  Administered 2021-09-01: 100 ug via INTRAVENOUS
  Administered 2021-09-01: 50 ug via INTRAVENOUS

## 2021-09-01 MED ORDER — ONDANSETRON HCL 4 MG/2ML IJ SOLN: 4.0000 mg | Freq: Once | INTRAMUSCULAR | Status: DC | PRN

## 2021-09-01 MED ORDER — CEFAZOLIN SODIUM-DEXTROSE 2-4 GM/100ML-% IV SOLN
INTRAVENOUS | Status: AC
  Filled 2021-09-01: qty 100

## 2021-09-01 MED ORDER — LACTATED RINGERS IV SOLN: INTRAVENOUS | Status: DC

## 2021-09-01 MED ORDER — DEXAMETHASONE SODIUM PHOSPHATE 4 MG/ML IJ SOLN
INTRAMUSCULAR | Status: DC | PRN
  Administered 2021-09-01: 5 mg via INTRAVENOUS

## 2021-09-01 MED ORDER — FENTANYL CITRATE (PF) 100 MCG/2ML IJ SOLN
INTRAMUSCULAR | Status: AC
  Filled 2021-09-01: qty 2

## 2021-09-01 MED ORDER — LIDOCAINE 2% (20 MG/ML) 5 ML SYRINGE
INTRAMUSCULAR | Status: AC
  Filled 2021-09-01: qty 5

## 2021-09-01 MED ORDER — SODIUM CHLORIDE 0.9% FLUSH: 3.0000 mL | Freq: Two times a day (BID) | INTRAVENOUS | Status: DC

## 2021-09-01 MED ORDER — DEXAMETHASONE SODIUM PHOSPHATE 10 MG/ML IJ SOLN
INTRAMUSCULAR | Status: AC
  Filled 2021-09-01: qty 1

## 2021-09-01 MED ORDER — EPHEDRINE SULFATE 50 MG/ML IJ SOLN
INTRAMUSCULAR | Status: DC | PRN
  Administered 2021-09-01: 20 mg via INTRAVENOUS

## 2021-09-01 MED ORDER — LIDOCAINE HCL (CARDIAC) PF 100 MG/5ML IV SOSY
PREFILLED_SYRINGE | INTRAVENOUS | Status: DC | PRN
  Administered 2021-09-01: 100 mg via INTRAVENOUS

## 2021-09-01 MED ORDER — MORPHINE SULFATE (PF) 4 MG/ML IV SOLN: 2.0000 mg | INTRAVENOUS | Status: DC | PRN

## 2021-09-01 MED ORDER — OXYCODONE HCL 5 MG PO TABS: 5.0000 mg | ORAL_TABLET | ORAL | Status: DC | PRN

## 2021-09-01 MED ORDER — ROCURONIUM BROMIDE 100 MG/10ML IV SOLN
INTRAVENOUS | Status: DC | PRN
  Administered 2021-09-01: 40 mg via INTRAVENOUS

## 2021-09-01 MED ORDER — ACETAMINOPHEN 325 MG RE SUPP: 650.0000 mg | RECTAL | Status: DC | PRN

## 2021-09-01 MED ORDER — SODIUM CHLORIDE 0.9% FLUSH: 3.0000 mL | INTRAVENOUS | Status: DC | PRN

## 2021-09-01 MED ORDER — ACETAMINOPHEN 325 MG PO TABS: 650.0000 mg | ORAL_TABLET | ORAL | Status: DC | PRN

## 2021-09-01 SURGICAL SUPPLY — 25 items
BAG DRAIN URO-CYSTO SKYTR STRL (DRAIN) ×2 IMPLANT
BAG DRN RND TRDRP ANRFLXCHMBR (UROLOGICAL SUPPLIES) ×1
BAG DRN UROCATH (DRAIN) ×1
BAG URINE DRAIN 2000ML AR STRL (UROLOGICAL SUPPLIES) ×2 IMPLANT
CATH FOLEY 2WAY SLVR  5CC 20FR (CATHETERS) ×2
CATH FOLEY 2WAY SLVR 5CC 20FR (CATHETERS) ×1 IMPLANT
CLOTH BEACON ORANGE TIMEOUT ST (SAFETY) ×2 IMPLANT
ELECT REM PT RETURN 9FT ADLT (ELECTROSURGICAL) ×2
ELECTRODE REM PT RTRN 9FT ADLT (ELECTROSURGICAL) ×1 IMPLANT
GLOVE SURG ENC MOIS LTX SZ6 (GLOVE) ×2 IMPLANT
GLOVE SURG POLYISO LF SZ8 (GLOVE) ×2 IMPLANT
GLOVE SURG UNDER POLY LF SZ6 (GLOVE) ×2 IMPLANT
GOWN STRL REUS W/TWL LRG LVL3 (GOWN DISPOSABLE) ×2 IMPLANT
GOWN STRL REUS W/TWL XL LVL3 (GOWN DISPOSABLE) ×2 IMPLANT
HOLDER FOLEY CATH W/STRAP (MISCELLANEOUS) ×2 IMPLANT
IV NS IRRIG 3000ML ARTHROMATIC (IV SOLUTION) ×6 IMPLANT
KIT TURNOVER CYSTO (KITS) ×2 IMPLANT
LOOP CUT BIPOLAR 24F LRG (ELECTROSURGICAL) ×2 IMPLANT
MANIFOLD NEPTUNE II (INSTRUMENTS) ×2 IMPLANT
PACK CYSTO (CUSTOM PROCEDURE TRAY) ×2 IMPLANT
SYR TOOMEY IRRIG 70ML (MISCELLANEOUS) ×2
SYRINGE TOOMEY IRRIG 70ML (MISCELLANEOUS) ×1 IMPLANT
TUBE CONNECTING 12X1/4 (SUCTIONS) ×2 IMPLANT
TUBING UROLOGY SET (TUBING) ×2 IMPLANT
WATER STERILE IRR 500ML POUR (IV SOLUTION) ×2 IMPLANT

## 2021-09-01 NOTE — Anesthesia Preprocedure Evaluation (Signed)
Anesthesia Evaluation  Patient identified by MRN, date of birth, ID band Patient awake    Reviewed: Allergy & Precautions, H&P , NPO status , Patient's Chart, lab work & pertinent test results  History of Anesthesia Complications (+) DIFFICULT AIRWAY  Airway Mallampati: III  TM Distance: <3 FB Neck ROM: Full    Dental no notable dental hx.    Pulmonary neg pulmonary ROS, Current Smoker,    Pulmonary exam normal breath sounds clear to auscultation       Cardiovascular hypertension, + Peripheral Vascular Disease  Normal cardiovascular exam Rhythm:Regular Rate:Normal     Neuro/Psych negative neurological ROS  negative psych ROS   GI/Hepatic negative GI ROS, Neg liver ROS,   Endo/Other  diabetes, Type 2  Renal/GU negative Renal ROS  negative genitourinary   Musculoskeletal negative musculoskeletal ROS (+)   Abdominal   Peds negative pediatric ROS (+)  Hematology negative hematology ROS (+)   Anesthesia Other Findings   Reproductive/Obstetrics negative OB ROS                             Anesthesia Physical Anesthesia Plan  ASA: 3  Anesthesia Plan: General   Post-op Pain Management:    Induction: Intravenous  PONV Risk Score and Plan: 1 and Ondansetron, Dexamethasone and Treatment may vary due to age or medical condition  Airway Management Planned: Oral ETT and Video Laryngoscope Planned  Additional Equipment:   Intra-op Plan:   Post-operative Plan: Extubation in OR  Informed Consent: I have reviewed the patients History and Physical, chart, labs and discussed the procedure including the risks, benefits and alternatives for the proposed anesthesia with the patient or authorized representative who has indicated his/her understanding and acceptance.     Dental advisory given  Plan Discussed with: CRNA and Surgeon  Anesthesia Plan Comments: (coffee with cream at 0900)         Anesthesia Quick Evaluation

## 2021-09-01 NOTE — Transfer of Care (Signed)
Immediate Anesthesia Transfer of Care Note  Patient: [name] [name]  Procedure(s) Performed: Procedure(s) (LRB): TRANSURETHRAL RESECTION OF BLADDER TUMOR WITH POST OPERATIVE INSTILLATION OF GEMCITABINE (N/A)  Patient Location: PACU  Anesthesia Type: General  Level of Consciousness: awake, sedated, patient cooperative and responds to stimulation  Airway & Oxygen Therapy: Patient Spontanous Breathing and Patient connected to [place] 02 and soft FM   Post-op Assessment: Report given to PACU RN, Post -op Vital signs reviewed and stable and Patient moving all extremities  Post vital signs: Reviewed and stable  Complications: No apparent anesthesia complications

## 2021-09-01 NOTE — H&P (Signed)
I have bladder cancer.  HPI: [name] [name] is a 65 year-old male established patient who is here for bladder cancer.    11/9/22[name] returns today in f/u. He had a CT prior to this visit that showed no concerning findings. He has a large prostate with asymmetric prostatomegaly that is stable since 2015. His PSA is 2.6 which is down from the last. He remains on finasteride and alfuzosin.   10/10/22[name] returns today in f/u. He had some difficulty urinating for 3 days last week and then he passed a pinkish mass of tissue about 2cm when voiding. His voiding symptoms have abated. His IPSS is 11 with some urgency and a sensation of incomplete emptying. His UA is clear today. He is scheduled for cystoscopy on 08/16/21.   05/19/21: [name] [name] returns for surveillance cystoscopy for his history of T1 HG NMIBC bladder cancer with completion of induction BCG on 03/14/21. He has no hematuria and his UA is clear today. He has some urgency and nocturia x 1 but no frequency or obstructive symptoms. He has no dysuria. He remains on finasteride. He tried an OTC supplement but it interfered with the Cialis. his PSA was up slightly to 3.87 on 5/23 but that was at the end of the BCG treatments. he has not had a prostate biopsy in the past. he was on the finasteride sporadically.   4/4/22[name] returns today in f/u from a restaging TURBT. His path was negative and muscle was in the specimen. His IPSS is 11. He has some urgency. His UA has microhematuria.    3/11/22[name] returns today in f/u. He was found on recent TURBT to have a T1 HG NMIBC of the anterior wall of the bladder. There was dysplasia of the right lateral wall and inflammation of the bladder neck with Von Brunn's nests. Muscle was present in the anterior specimen but not involved. He is scheduled for a restaging TURBT on 01/06/21. He has no gross hematuria but has some microhematuria today. He is having some dysuria. He is otherwise voiding without complaints.      AUA Symptom Score: 50% of the time he has the sensation of not emptying his bladder completely when finished urinating. Less than 20% of the time he has to urinate again fewer than two hours after he has finished urinating. Less than 20% of the time he has to start and stop again several times when he urinates. 50% of the time he finds it difficult to postpone urination. Less than 50% of the time he has a weak urinary stream. He never has to push or strain to begin urination. He has to get up to urinate 1 time from the time he goes to bed until the time he gets up in the morning.   Calculated AUA Symptom Score: 11    ALLERGIES: Flomax CAPS Ondansetron HCl TABS    MEDICATIONS: Cialis  Finasteride  Alfuzosin Hcl Er 10 mg tablet, extended release 24 hr 1 tablet PO Daily  Amlodipine Besilate  Klor-Con  Olmesartan Medoxomil  Repeglinide  Synjardy  Vitamin C  Vitamin D3     GU PSH: Bladder Instill AntiCA Agent - 03/14/2021, 03/07/2021, 02/28/2021, 02/21/2021, 02/14/2021, 02/07/2021, 01/06/2021 Cystoscopy - 05/19/2021, 11/23/2020 Cystoscopy TURBT >5 cm - 01/06/2021 Cystoscopy TURBT 2-5 cm - 12/09/2020 ESWL - 2016, 2013 Locm 300-399Mg /Ml Iodine,1Ml - 08/21/2021, 08/15/2020 Partial nephrectomy (laparoscopic) - 2013       PSH Notes: Lithotripsy, Kidney Surgery Laparoscopic Partial Nephrectomy, Lithotripsy   NON-GU PSH:  None   GU PMH: History of bladder cancer - 08/21/2021, He passed some tissue that may have been just residual material from the BCG healing process. The urine is clear today but I will send a cytology. He has cystoscopy scheduled in November. , - 07/24/2021, No recurrent tumors seen. I will get a cytology today and have him return in 3 months for cystoscopy and consideration of salvage BCG. , - 05/19/2021 BPH w/LUTS, He has moderate LUTS on finasteride and alfuzosin but the symptoms have improved since he passed the material. No change in therapy. - 07/24/2021, I am going to have him stay on  the finasteride daily and start alfuzosin to see if that will have less of an impact on ejaculation. , - 05/19/2021, He has some urgency following the procedure but is otherwise voiding ok. , - 01/16/2021, He has persistent urgency with BPH and BOO on cystoscopy but there is worrisome asymmetric erythema of the bladder neck that is worrisome for CIS but he did have a small stone in the prostatic urethra that was in the bladder on CT and could have caused inflammatory changes. I will send a cytology and have him scheduled for a cystoscopy with bladder biopsy. The risks of bleeding, infection, injury to the urinary tract, thrombotic events and anesthetic complications reviewed. , - 11/23/2020, His PSA has been rising and I will repeat that today. , - 08/03/2020, Benign prostatic hyperplasia with urinary obstruction, - 2015 Urinary Urgency - 07/24/2021, (Stable), This is probably related to the calcium deposition and recent BCG. I will reassess in the 3 months. He may need removal if the calcium deposits worsen. , - 05/19/2021, - 01/16/2021, - 11/23/2020, - 08/03/2020 Bladder Stone, He has some calcification deposition on the bladder wall at the resection sites that are possibly related to his urgency. - 05/19/2021, I will remove this if still present at cystoscopy., - 11/23/2020 Elevated PSA, His PSA is up somewhat on intermittent finasteride. There may be some impact from the BCG. I will have him stay on the finasteride and repeat the PSA in 3 months prior to f/u. - 05/19/2021 Bladder Cancer Anterior - 03/14/2021, - 03/07/2021, - 02/28/2021, - 02/21/2021, - 02/14/2021, - 02/07/2021, His restaging path was negative for residual or muscle invasive disease. I will have him return for BCG induction and have reviewed the side effects. , - 01/16/2021, He has T1 HG NMIBC of the anterior bladder wall and will have a restaging TURBT on 01/06/21. If no muscle invasion is found at the next resection, he will need induction BCG therapy. I reviewed the  risks of the procedure and also the BCG therapy. , - 12/23/2020 Adrenal mass Unspec, He has stable adrenal adenomas. - 11/23/2020, Adrenal cortical adenoma, unspecified laterality, - 2015 Renal calculus, He has small bilateral renal stones and cysts. - 11/23/2020 History of kidney cancer - 08/15/2020, I will get a CXR for his history of renal cell CA. , - 08/03/2020 ED due to arterial insufficiency, He has progressive ED and LUTS. I discussed options and will try him on tadalafil 5mg  daily. He is a daily MJ user so he could have low T. I will check his level.. - 08/03/2020 History of urolithiasis, He had gross hematuria and may have passed a stone a couple of months ago. I will get him set up for a CT Hematuria study to assess the cause of the hematuria. - 08/03/2020, History of renal calculi, - 2016 Urinary Frequency - 08/03/2020 Kidney Cancer Unspec, except  renal pelvis, Renal cell carcinoma, unspecified laterality - 2016 Other microscopic hematuria, Microscopic hematuria - 2016      PMH Notes: cancer kidney   NON-GU PMH: Encounter for general adult medical examination without abnormal findings, Encounter for preventive health examination - 2016 Cervical disc disorder, unspecified, unspecified cervical region, Cervical Disc Degeneration - 2014 Personal history of other diseases of the circulatory system, History of hypertension - 2014 Personal history of other diseases of the nervous system and sense organs, History of glaucoma - 2014 Personal history of other endocrine, nutritional and metabolic disease, History of diabetes mellitus - 2014 Hypertension    FAMILY HISTORY: 1 Daughter - Other 1 son - Other Colon Cancer - Mother Kidney Cancer - No Family History   SOCIAL HISTORY: Marital Status: Married Current Smoking Status: Patient smokes. Smokes less than 1/2 pack per day.   Tobacco Use Assessment Completed: Used Tobacco in last 30 days? Has never drank.  Drinks 4+ caffeinated drinks per  day.     Notes: Current every day smoker, Using Marijuana, Tobacco Use, Marital History - Currently Married, Alcohol Use, Occupation:   REVIEW OF SYSTEMS:    GU Review Male:   Patient denies frequent urination, hard to postpone urination, burning/ pain with urination, get up at night to urinate, leakage of urine, stream starts and stops, trouble starting your stream, have to strain to urinate , erection problems, and penile pain.  Gastrointestinal (Upper):   Patient denies nausea, vomiting, and indigestion/ heartburn.  Gastrointestinal (Lower):   Patient denies diarrhea and constipation.  Constitutional:   Patient denies fever, night sweats, weight loss, and fatigue.  Skin:   Patient denies skin rash/ lesion and itching.  Eyes:   Patient denies blurred vision and double vision.  Ears/ Nose/ Throat:   Patient denies sore throat and sinus problems.  Hematologic/Lymphatic:   Patient denies easy bruising and swollen glands.  Cardiovascular:   Patient denies leg swelling and chest pains.  Respiratory:   Patient denies cough and shortness of breath.  Endocrine:   Patient denies excessive thirst.  Musculoskeletal:   Patient denies back pain and joint pain.  Neurological:   Patient denies headaches and dizziness.  Psychologic:   Patient denies depression and anxiety.   VITAL SIGNS: None   MULTI-SYSTEM PHYSICAL EXAMINATION:    Constitutional: Obese. No physical deformities. Normally developed. Good grooming.   Respiratory: Normal breath sounds. No labored breathing, no use of accessory muscles.   Cardiovascular: Regular rate and rhythm. No murmur, no gallop.      Complexity of Data:  Lab Test Review:   PSA  Records Review:   AUA Symptom Score, Previous Patient Records  Urine Test Review:   Urinalysis  X-Ray Review: C.T. Hematuria: Reviewed Films. Reviewed Report. Discussed With Patient.     08/16/21 03/06/21 08/03/20 08/12/07  PSA  Total PSA 2.60 ng/mL 3.87 ng/ml 3.78 ng/mL 2.26      PROCEDURES:         Flexible Cystoscopy - 52000  Risks, benefits, and some of the potential complications of the procedure were discussed.    Meatus:  Normal size. Normal location. Normal condition.  Urethra:  No strictures.  External Sphincter:  Normal.  Verumontanum:  Normal.  Prostate:  Obstructing. Enlarged median lobe. Moderate hyperplasia.  Bladder Neck:  Non-obstructing.  Ureteral Orifices:  Normal location. Normal size. Normal shape. Effluxed clear urine.  Bladder:  Mild trabeculation. Multifocal tumors with a 1.5cm tumor on the dome and a smaller right lateral wall tumor.  He has several small stones. Normal mucosa. No stones.      The procedure was well tolerated and there were no complications.         Urinalysis Dipstick Dipstick Cont'd  Color: Yellow Bilirubin: Neg mg/dL  Appearance: Clear Ketones: Trace mg/dL  Specific Gravity: 1.025 Blood: Neg ery/uL  pH: 6.0 Protein: Neg mg/dL  Glucose: 2+ mg/dL Urobilinogen: 0.2 mg/dL    Nitrites: Neg    Leukocyte Esterase: Neg leu/uL    ASSESSMENT:      ICD-10 Details  1 GU:   History of bladder cancer - Z85.51 Chronic, Worsening - He has 2 new lesions on the dome and right lateral wall that are consistent with recurrent urothelial CA. I will get him scheduled for TURBT with instillation of Gemcitabine. I have reviewed the risks in detail.   2   Bladder Cancer Dome - C67.1 Undiagnosed New Problem  3   Bladder Cancer Lateral - C67.2 Undiagnosed New Problem  4   BPH w/LUTS - N40.1 Chronic, Stable - He has stable BPH with a large trilobar prostate. He has moderate LUTS.  5   Urinary Urgency - R39.15 Chronic, Stable  6   Bladder Stone - N21.0 Chronic, Worsening - He has multiple small bladder stones that will be removed at the time of the TURBT. They have an unusual behavior of floating somewhat.    PLAN:           Schedule Return Visit/Planned Activity: Next Available Appointment - Schedule Surgery

## 2021-09-01 NOTE — Anesthesia Postprocedure Evaluation (Signed)
Anesthesia Post Note  Patient: [name] [name]  Procedure(s) Performed: TRANSURETHRAL RESECTION OF BLADDER TUMOR WITH POST OPERATIVE INSTILLATION OF GEMCITABINE (Bladder)     Patient location during evaluation: PACU Anesthesia Type: General Level of consciousness: awake and alert Pain management: pain level controlled Vital Signs Assessment: post-procedure vital signs reviewed and stable Respiratory status: spontaneous breathing, nonlabored ventilation, respiratory function stable and patient connected to nasal cannula oxygen Cardiovascular status: blood pressure returned to baseline and stable Postop Assessment: no apparent nausea or vomiting Anesthetic complications: no   No notable events documented.  Last Vitals:  Vitals:   09/01/21 1345 09/01/21 1400  BP: 132/73 (!) 144/79  Pulse: 66 66  Resp: 18 18  Temp:    SpO2: 99% 99%    Last Pain:  Vitals:   09/01/21 1324  TempSrc:   PainSc: 0-No pain                 Grettell Ransdell S

## 2021-09-01 NOTE — Interval H&P Note (Signed)
History and Physical Interval Note:  09/01/2021 12:12 PM  [name] [name]  has presented today for surgery, with the diagnosis of bladder tumor.  The various methods of treatment have been discussed with the patient and family. After consideration of risks, benefits and other options for treatment, the patient has consented to  Procedure(s): TRANSURETHRAL RESECTION OF BLADDER TUMOR WITH GEMCITABINE (N/A) as a surgical intervention.  The patient's history has been reviewed, patient examined, no change in status, stable for surgery.  I have reviewed the patient's chart and labs.  Questions were answered to the patient's satisfaction.     Irine Seal

## 2021-09-01 NOTE — Anesthesia Procedure Notes (Signed)
Procedure Name: Intubation Date/Time: 09/01/2021 12:33 PM Performed by: Justice Rocher, CRNA Pre-anesthesia Checklist: Patient identified, Emergency Drugs available, Suction available, Patient being monitored and Timeout performed Patient Re-evaluated:Patient Re-evaluated prior to induction Oxygen Delivery Method: Circle system utilized Preoxygenation: Pre-oxygenation with 100% oxygen Induction Type: IV induction Ventilation: Mask ventilation without difficulty Laryngoscope Size: Mac and 4 Grade View: Grade II Tube type: Oral Number of attempts: 1 Airway Equipment and Method: Stylet, Oral airway and Video-laryngoscopy Placement Confirmation: ETT inserted through vocal cords under direct vision, positive ETCO2, breath sounds checked- equal and bilateral and CO2 detector Secured at: 23 cm Tube secured with: Tape Dental Injury: Teeth and Oropharynx as per pre-operative assessment

## 2021-09-01 NOTE — Op Note (Signed)
Procedure: 1.  Cystoscopy with removal of bladder stones, simple. 2.Cystoscopy with transurethral resection of bladder tumors. 3. Instillation of gemcitabine in PACU.  Preop diagnosis: Bladder lesions concerning for recurrent urothelial carcinoma.  Postop diagnosis: Same.  Surgeon: Dr. Irine Seal.  Anesthesia: General.  Specimen: 1.  Bladder lesion specimen from the trigone. 2.  Bladder lesion specimens from the dome. 3.  Bladder lesion specimens from the left anterior lateral wall.  Drain: 53 [place] Foley catheter.  EBL: None.  Complications: None.  Indications: [name] [name] is a 65 year old male with a history of urothelial carcinoma with recent cystoscopic evidence concerning for recurrence following BCG therapy.  He also has small bladder stones.  Procedure: He was taken operating room where he was given antibiotics.  A general anesthetic was induced including paralytic.  He was placed in lithotomy position and fitted with PAS hose.  His perineum and genitalia were prepped with Betadine solution and draped in usual sterile fashion.  Cystoscopy was performed using the 21 [place] scope and 30 degree lens.  Examination revealed a normal urethra.  The external sphincter was intact.  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  Examination of bladder demonstrated mild trabeculation.  The ureteral orifices were unremarkable.  There were mucosal lesions in the trigone, right dome and left anterior wall there concerning for recurrent urothelial neoplasm, possibly carcinoma in situ.  There were no real papillary lesions.  Initially a cup biopsy forceps was used to obtain biopsies from the trigone, right dome and left anterior bladder wall.  The cystoscope was then removed and a 26 French continuous-flow resectoscope sheath was then inserted.  This was fitted with an Beatrix Fetters handle with a bipolar loop and 30 degree lens.  Saline was used the  irrigant.  The trigone and right dome lesions were generously fulgurated for hemostasis and there was noted to be some additional mucosal concern at the right dome which was widely fulgurated.  The diameter of these 2 lesions were approximately 1 cm and 1.5 cm.  The lesion on the left anterior wall had a little more mucosal thickening with cobblestoning and additional specimens were obtained via resection.  The biopsy site and the surrounding mucosa which had some concerning changes was then generously fulgurated and the diameter of this lesion was approximately 2 and half to 3 cm with a small adjacent lesion that was fulgurated as well.  Inspection of the bladder wall demonstrated no bladder wall perforation, but muscle was apparent.  No active bleeding was noted.  The additional tissue had been removed and placed with the left anterior wall specimen.  The scope was removed and a 20 [place] Foley catheter was inserted.  The balloon was filled with 10 mL of sterile fluid.  The catheter was placed to drainage.  He was taken down from lithotomy position, his anesthetic was reversed and he was moved recovery in stable condition.  In the PACU his bladder was instilled with 2000 mg of gemcitabine and 50 mL of diluent.  This was left indwelling for 1 hour.  The catheter was then replaced the drainage and he was sent home with a catheter.  There were no complications.

## 2021-09-01 NOTE — Discharge Instructions (Addendum)
You may remove the catheter in the morning.     Post Anesthesia Home Care Instructions  Activity: Get plenty of rest for the remainder of the day. A responsible individual must stay with you for 24 hours following the procedure.  For the next 24 hours, DO NOT: -Drive a car -Paediatric nurse -Drink alcoholic beverages -Take any medication unless instructed by your physician -Make any legal decisions or sign important papers.  Meals: Start with liquid foods such as gelatin or soup. Progress to regular foods as tolerated. Avoid greasy, spicy, heavy foods. If nausea and/or vomiting occur, drink only clear liquids until the nausea and/or vomiting subsides. Call your physician if vomiting continues.  Special Instructions/Symptoms: Your throat may feel dry or sore from the anesthesia or the breathing tube placed in your throat during surgery. If this causes discomfort, gargle with warm salt water. The discomfort should disappear within 24 hours.

## 2021-09-04 ENCOUNTER — Encounter (HOSPITAL_BASED_OUTPATIENT_CLINIC_OR_DEPARTMENT_OTHER): Payer: Self-pay | Admitting: Urology

## 2021-09-04 LAB — SURGICAL PATHOLOGY

## 2021-09-15 DIAGNOSIS — C671 Malignant neoplasm of dome of bladder: Secondary | ICD-10-CM | POA: Diagnosis not present

## 2021-09-15 DIAGNOSIS — C672 Malignant neoplasm of lateral wall of bladder: Secondary | ICD-10-CM | POA: Diagnosis not present

## 2021-09-16 ENCOUNTER — Other Ambulatory Visit: Payer: Self-pay

## 2021-09-16 ENCOUNTER — Encounter (HOSPITAL_COMMUNITY): Payer: Self-pay | Admitting: Internal Medicine

## 2021-09-16 ENCOUNTER — Inpatient Hospital Stay (HOSPITAL_COMMUNITY)
Admission: EM | Admit: 2021-09-16 | Discharge: 2021-09-18 | DRG: 700 | Disposition: A | Discharge status: Home / Self Care | Payer: BC Managed Care – PPO | Attending: Internal Medicine | Admitting: Internal Medicine

## 2021-09-16 DIAGNOSIS — C671 Malignant neoplasm of dome of bladder: Secondary | ICD-10-CM | POA: Diagnosis not present

## 2021-09-16 DIAGNOSIS — Z85528 Personal history of other malignant neoplasm of kidney: Secondary | ICD-10-CM

## 2021-09-16 DIAGNOSIS — F17200 Nicotine dependence, unspecified, uncomplicated: Secondary | ICD-10-CM | POA: Diagnosis present

## 2021-09-16 DIAGNOSIS — I10 Essential (primary) hypertension: Secondary | ICD-10-CM | POA: Diagnosis present

## 2021-09-16 DIAGNOSIS — Z905 Acquired absence of kidney: Secondary | ICD-10-CM | POA: Diagnosis not present

## 2021-09-16 DIAGNOSIS — E118 Type 2 diabetes mellitus with unspecified complications: Secondary | ICD-10-CM | POA: Diagnosis present

## 2021-09-16 DIAGNOSIS — Z20822 Contact with and (suspected) exposure to covid-19: Secondary | ICD-10-CM | POA: Diagnosis present

## 2021-09-16 DIAGNOSIS — Z885 Allergy status to narcotic agent status: Secondary | ICD-10-CM

## 2021-09-16 DIAGNOSIS — Z888 Allergy status to other drugs, medicaments and biological substances status: Secondary | ICD-10-CM | POA: Diagnosis not present

## 2021-09-16 DIAGNOSIS — N400 Enlarged prostate without lower urinary tract symptoms: Secondary | ICD-10-CM | POA: Diagnosis present

## 2021-09-16 DIAGNOSIS — K219 Gastro-esophageal reflux disease without esophagitis: Secondary | ICD-10-CM | POA: Diagnosis present

## 2021-09-16 DIAGNOSIS — C679 Malignant neoplasm of bladder, unspecified: Secondary | ICD-10-CM | POA: Diagnosis not present

## 2021-09-16 DIAGNOSIS — Y838 Other surgical procedures as the cause of abnormal reaction of the patient, or of later complication, without mention of misadventure at the time of the procedure: Secondary | ICD-10-CM | POA: Diagnosis present

## 2021-09-16 DIAGNOSIS — Z7984 Long term (current) use of oral hypoglycemic drugs: Secondary | ICD-10-CM

## 2021-09-16 DIAGNOSIS — F17210 Nicotine dependence, cigarettes, uncomplicated: Secondary | ICD-10-CM | POA: Diagnosis present

## 2021-09-16 DIAGNOSIS — N9989 Other postprocedural complications and disorders of genitourinary system: Principal | ICD-10-CM | POA: Diagnosis present

## 2021-09-16 DIAGNOSIS — Z79899 Other long term (current) drug therapy: Secondary | ICD-10-CM

## 2021-09-16 DIAGNOSIS — R310 Gross hematuria: Secondary | ICD-10-CM | POA: Diagnosis not present

## 2021-09-16 DIAGNOSIS — E785 Hyperlipidemia, unspecified: Secondary | ICD-10-CM | POA: Diagnosis present

## 2021-09-16 DIAGNOSIS — Z8719 Personal history of other diseases of the digestive system: Secondary | ICD-10-CM | POA: Diagnosis not present

## 2021-09-16 DIAGNOSIS — Z800 Family history of malignant neoplasm of digestive organs: Secondary | ICD-10-CM

## 2021-09-16 DIAGNOSIS — I2510 Atherosclerotic heart disease of native coronary artery without angina pectoris: Secondary | ICD-10-CM | POA: Diagnosis not present

## 2021-09-16 DIAGNOSIS — R338 Other retention of urine: Secondary | ICD-10-CM | POA: Diagnosis not present

## 2021-09-16 DIAGNOSIS — Z87442 Personal history of urinary calculi: Secondary | ICD-10-CM

## 2021-09-16 DIAGNOSIS — D751 Secondary polycythemia: Secondary | ICD-10-CM | POA: Diagnosis not present

## 2021-09-16 DIAGNOSIS — E1129 Type 2 diabetes mellitus with other diabetic kidney complication: Secondary | ICD-10-CM | POA: Diagnosis present

## 2021-09-16 DIAGNOSIS — Z807 Family history of other malignant neoplasms of lymphoid, hematopoietic and related tissues: Secondary | ICD-10-CM | POA: Diagnosis not present

## 2021-09-16 LAB — COMPREHENSIVE METABOLIC PANEL
ALT: 30 U/L (ref 0–44)
AST: 14 U/L — ABNORMAL LOW (ref 15–41)
Albumin: 4.5 g/dL (ref 3.5–5.0)
Alkaline Phosphatase: 116 U/L (ref 38–126)
Anion gap: 10 (ref 5–15)
BUN: 26 mg/dL — ABNORMAL HIGH (ref 8–23)
CO2: 20 mmol/L — ABNORMAL LOW (ref 22–32)
Calcium: 9.7 mg/dL (ref 8.9–10.3)
Chloride: 111 mmol/L (ref 98–111)
Creatinine, Ser: 0.7 mg/dL (ref 0.61–1.24)
GFR, Estimated: >60 mL/min (ref >60)
Glucose, Bld: 276 mg/dL — ABNORMAL HIGH (ref 70–99)
Potassium: 3.5 mmol/L (ref 3.5–5.1)
Sodium: 141 mmol/L (ref 135–145)
Total Bilirubin: 1.1 mg/dL (ref 0.3–1.2)
Total Protein: 7.5 g/dL (ref 6.5–8.1)

## 2021-09-16 LAB — URINALYSIS, ROUTINE W REFLEX MICROSCOPIC

## 2021-09-16 LAB — CBG MONITORING, ED
Glucose-Capillary: 196 mg/dL — ABNORMAL HIGH (ref 70–99)
Glucose-Capillary: 224 mg/dL — ABNORMAL HIGH (ref 70–99)

## 2021-09-16 LAB — CBC
HCT: 52.9 % — ABNORMAL HIGH (ref 39.0–52.0)
Hemoglobin: 17.8 g/dL — ABNORMAL HIGH (ref 13.0–17.0)
MCH: 29.5 pg (ref 26.0–34.0)
MCHC: 33.6 g/dL (ref 30.0–36.0)
MCV: 87.7 fL (ref 80.0–100.0)
Platelets: 176 10*3/uL (ref 150–400)
RBC: 6.03 MIL/uL — ABNORMAL HIGH (ref 4.22–5.81)
RDW: 14.7 % (ref 11.5–15.5)
WBC: 14.8 10*3/uL — ABNORMAL HIGH (ref 4.0–10.5)
nRBC: 0 % (ref 0.0–0.2)

## 2021-09-16 LAB — TYPE AND SCREEN
ABO/RH(D): O POS
ABO/RH(D): O POS
Antibody Screen: NEGATIVE
Antibody Screen: NEGATIVE

## 2021-09-16 LAB — HEMOGLOBIN AND HEMATOCRIT, BLOOD
HCT: 44.1 % (ref 39.0–52.0)
Hemoglobin: 14.8 g/dL (ref 13.0–17.0)

## 2021-09-16 LAB — RESP PANEL BY RT-PCR (FLU A&B, COVID) ARPGX2
Influenza A by PCR: NEGATIVE
Influenza B by PCR: NEGATIVE
SARS Coronavirus 2 by RT PCR: NEGATIVE

## 2021-09-16 MED ORDER — POTASSIUM CHLORIDE CRYS ER 20 MEQ PO TBCR
20.0000 meq | EXTENDED_RELEASE_TABLET | Freq: Every day | ORAL | Status: DC
Start: 2021-09-17 — End: 2021-09-18
  Administered 2021-09-17 – 2021-09-18 (×2): 20 meq via ORAL
  Filled 2021-09-16 (×2): qty 1

## 2021-09-16 MED ORDER — IRBESARTAN 300 MG PO TABS
300.0000 mg | ORAL_TABLET | Freq: Every day | ORAL | Status: DC
  Administered 2021-09-16 – 2021-09-18 (×3): 300 mg via ORAL
  Filled 2021-09-16 (×3): qty 1

## 2021-09-16 MED ORDER — LACTATED RINGERS IV BOLUS
1000.0000 mL | Freq: Once | INTRAVENOUS | Status: AC
  Administered 2021-09-16: 1000 mL via INTRAVENOUS

## 2021-09-16 MED ORDER — ACETAMINOPHEN 325 MG PO TABS: 650.0000 mg | ORAL_TABLET | Freq: Four times a day (QID) | ORAL | Status: DC | PRN

## 2021-09-16 MED ORDER — ONDANSETRON HCL 4 MG/2ML IJ SOLN
4.0000 mg | Freq: Once | INTRAMUSCULAR | Status: AC
  Administered 2021-09-16: 4 mg via INTRAVENOUS
  Filled 2021-09-16: qty 2

## 2021-09-16 MED ORDER — SODIUM CHLORIDE 0.9 % IR SOLN
3000.0000 mL | Status: DC
  Administered 2021-09-16 – 2021-09-17 (×7): 3000 mL

## 2021-09-16 MED ORDER — ONDANSETRON HCL 4 MG PO TABS: 4.0000 mg | ORAL_TABLET | Freq: Four times a day (QID) | ORAL | Status: DC | PRN

## 2021-09-16 MED ORDER — CEFTRIAXONE SODIUM 1 G IJ SOLR
1.0000 g | Freq: Once | INTRAMUSCULAR | Status: AC
  Administered 2021-09-16: 1 g via INTRAVENOUS
  Filled 2021-09-16: qty 10

## 2021-09-16 MED ORDER — ONDANSETRON HCL 4 MG/2ML IJ SOLN: 4.0000 mg | Freq: Four times a day (QID) | INTRAMUSCULAR | Status: DC | PRN

## 2021-09-16 MED ORDER — EMPAGLIFLOZIN 25 MG PO TABS
25.0000 mg | ORAL_TABLET | Freq: Every day | ORAL | Status: DC
  Administered 2021-09-17 – 2021-09-18 (×2): 25 mg via ORAL
  Filled 2021-09-16 (×2): qty 1

## 2021-09-16 MED ORDER — ACETAMINOPHEN 650 MG RE SUPP: 650.0000 mg | Freq: Four times a day (QID) | RECTAL | Status: DC | PRN

## 2021-09-16 MED ORDER — OXYCODONE HCL 5 MG PO TABS
5.0000 mg | ORAL_TABLET | ORAL | Status: DC | PRN
  Administered 2021-09-16 – 2021-09-17 (×2): 5 mg via ORAL
  Filled 2021-09-16 (×2): qty 1

## 2021-09-16 MED ORDER — POTASSIUM CHLORIDE CRYS ER 20 MEQ PO TBCR
40.0000 meq | EXTENDED_RELEASE_TABLET | Freq: Once | ORAL | Status: AC
  Administered 2021-09-16: 40 meq via ORAL
  Filled 2021-09-16: qty 2

## 2021-09-16 MED ORDER — FINASTERIDE 5 MG PO TABS
5.0000 mg | ORAL_TABLET | Freq: Every day | ORAL | Status: DC
  Administered 2021-09-16 – 2021-09-18 (×3): 5 mg via ORAL
  Filled 2021-09-16 (×3): qty 1

## 2021-09-16 MED ORDER — METFORMIN HCL ER 500 MG PO TB24
1000.0000 mg | ORAL_TABLET | Freq: Two times a day (BID) | ORAL | Status: DC
  Administered 2021-09-17 – 2021-09-18 (×3): 1000 mg via ORAL
  Filled 2021-09-16 (×4): qty 2

## 2021-09-16 MED ORDER — REPAGLINIDE 1 MG PO TABS
2.0000 mg | ORAL_TABLET | Freq: Three times a day (TID) | ORAL | Status: DC
  Administered 2021-09-17 – 2021-09-18 (×2): 2 mg via ORAL
  Filled 2021-09-16 (×3): qty 2
  Filled 2021-09-16 (×3): qty 1

## 2021-09-16 MED ORDER — INSULIN ASPART 100 UNIT/ML IJ SOLN
0.0000 [IU] | Freq: Three times a day (TID) | INTRAMUSCULAR | Status: DC
  Administered 2021-09-16: 5 [IU] via SUBCUTANEOUS
  Administered 2021-09-16: 3 [IU] via SUBCUTANEOUS
  Administered 2021-09-17: 15:00:00 8 [IU] via SUBCUTANEOUS
  Administered 2021-09-17 – 2021-09-18 (×2): 3 [IU] via SUBCUTANEOUS
  Administered 2021-09-18: 2 [IU] via SUBCUTANEOUS
  Filled 2021-09-16: qty 0.15

## 2021-09-16 MED ORDER — HYDROMORPHONE HCL 1 MG/ML IJ SOLN
1.0000 mg | Freq: Once | INTRAMUSCULAR | Status: AC
  Administered 2021-09-16: 1 mg via INTRAVENOUS
  Filled 2021-09-16: qty 1

## 2021-09-16 MED ORDER — EMPAGLIFLOZIN-METFORMIN HCL ER 12.5-1000 MG PO TB24: 1.0000 | ORAL_TABLET | Freq: Two times a day (BID) | ORAL | Status: DC

## 2021-09-16 MED ORDER — SODIUM CHLORIDE 0.9 % IV SOLN: INTRAVENOUS | Status: DC

## 2021-09-16 MED ORDER — CHLORHEXIDINE GLUCONATE CLOTH 2 % EX PADS
6.0000 | MEDICATED_PAD | Freq: Every day | CUTANEOUS | Status: DC
  Administered 2021-09-17 – 2021-09-18 (×2): 6 via TOPICAL

## 2021-09-16 NOTE — Consult Note (Addendum)
Urology Consult   Physician requesting consult: Fredia Sorrow MD   Reason for consult: Gross hematuria   History of Present Illness: [name] [name] is a 65 y.o. with history of HTN, DM, and NMIBC with recent TURBT on 11/18 who presents to the ED with gross hematuria and clot retention. Patient had TURBT with resection of multiple small bladder tumors on 11/18. He was discharged with a foley that was subsequently removed and he was voiding well at home without issues. He saw Dr. Jeffie Pollock in follow up yesterday at which point he had no symptoms. At around 3am this morning he developed intense urge to void and was dribbling blood per meatus. He then voided very large clots prompting his presentation to the ED.   On initial presentation he was in significant discomfort with elevated BP to the 920F systolic. A 55fr 4 way catheter was placed by the ED with immediate return of 400cc urine output which alleviated his pain and improved his blood pressure. Urology hand irrigated with 2L NS and had return of ~300cc clot burden. CBI was initiated to light pink on medium drip. Initial lab work is reassuring with stable Hgb and Cr at baseline. Abdominal exam soft and nontender after irrigation.   Past Medical History:  Diagnosis Date   Benign localized prostatic hyperplasia with lower urinary tract symptoms (LUTS)    urologist--- dr Jeffie Pollock   Bladder cancer [facility])    urologist---- dr Jeffie Pollock---  s/p TURBT 11/2020; 03/ 2022   Coronary artery disease    DDD (degenerative disc disease), cervical    From 1980s   Diabetes mellitus type II    followed by pcp--   (01-05-2021  per pt checks blood sugar three times per week,  fasting sugar --- 130--140_   Difficult intubation 07/28/2012   partial nephrectomy done $RemoveBeforeDE'@[email or web address]$  07-28-2012  found to have anterior larynx , diffcult intubation, referred to anesthesia record in epic    Dyslipidemia    ED (erectile dysfunction)    Elevated PSA    GERD (gastroesophageal reflux  disease)    History of adenomatous polyp of colon    History of kidney stones    History of prostatitis 2008   History of renal carcinoma urologist--- dr Jeffie Pollock   07-28-2012 for renal neoplasm  s/p right partal nephrectomy    Hypertension    followed by pcp   Osteoarthritis    Back    Past Surgical History:  Procedure Laterality Date   COLONOSCOPY N/A 03/03/2013   Procedure: COLONOSCOPY;  Surgeon: Irene Shipper, MD;  [facility];  Service: Endoscopy;  Laterality: N/A;   COLONOSCOPY WITH PROPOFOL N/A 09/29/2019   Procedure: COLONOSCOPY WITH PROPOFOL;  Surgeon: Irene Shipper, MD;  [facility];  Service: Endoscopy;  Laterality: N/A;   CYSTOSCOPY WITH BIOPSY N/A 12/09/2020   Procedure: CYSTOSCOPY WITH TURBT, BIOPSY AND FULGURATION;  Surgeon: Irine Seal, MD;  [facility];  Service: Urology;  Laterality: N/A;  REQUESTING [place] LITHOTRIPSY  01-10-2015;  03-03-2012  $RemoveBef'@[facility]'[email or web address]$    POLYPECTOMY  09/29/2019   Procedure: POLYPECTOMY;  Surgeon: Irene Shipper, MD;  [facility];  Service: Endoscopy;;   ROBOT ASSISTED LAPAROSCOPIC PARTIAL NEPHRECTOMY  07-28-2012   DR Alinda Money  $Remo'@[facility]'balqw$    TONSILLECTOMY  1965   TRANSURETHRAL RESECTION OF BLADDER TUMOR WITH MITOMYCIN-C N/A 01/06/2021   Procedure: TRANSURETHRAL RESECTION OF BLADDER TUMOR WITH GEMCITIBINE IN PACU;  Surgeon: Irine Seal, MD;  [facility];  Service: Urology;  Laterality: N/A;   TRANSURETHRAL RESECTION OF BLADDER TUMOR WITH MITOMYCIN-C N/A 09/01/2021   Procedure: TRANSURETHRAL RESECTION OF BLADDER TUMOR WITH POST OPERATIVE INSTILLATION OF GEMCITABINE;  Surgeon: Irine Seal, MD;  Location: [facility];  Service: Urology;  Laterality: N/A;    Current Hospital Medications:  Home Meds:  No current facility-administered medications on file prior to encounter.   Current Outpatient Medications on File Prior to Encounter  Medication Sig Dispense Refill    acetaminophen (TYLENOL) 325 MG tablet Take 650 mg by mouth every 6 (six) hours as needed.     amLODipine (NORVASC) 5 MG tablet TAKE 1 TABLET BY MOUTH EVERY DAY (Patient taking differently: Take 5 mg by mouth daily.) 90 tablet 3   Ascorbic Acid (VITAMIN C) 1000 MG tablet Take 1,000 mg by mouth daily as needed (immunine).     blood glucose meter kit and supplies KIT Use to test blood sugar once a day. DX: E11.9 1 each 0   Blood Glucose Monitoring Suppl (CONTOUR NEXT MONITOR) w/Device KIT 1 Device by Does not apply route daily. 1 kit 0   cholecalciferol (VITAMIN D) 1000 UNITS tablet Take 1,000 Units by mouth daily.     CONTOUR NEXT TEST test strip USE AS DIRECTED 100 strip 5   Empagliflozin-metFORMIN HCl ER (SYNJARDY XR) 12.02-999 MG TB24 Take 1 tablet by mouth 2 (two) times daily. 180 tablet 3   finasteride (PROSCAR) 5 MG tablet TAKE 1 TABLET BY MOUTH EVERY DAY (Patient taking differently: Take 5 mg by mouth daily.) 90 tablet 3   HYDROcodone-acetaminophen (NORCO/VICODIN) 5-325 MG tablet Take 1 tablet by mouth every 6 (six) hours as needed for moderate pain. 8 tablet 0   Lancets MISC Use to check blood sugar once per day. 100 each 3   olmesartan (BENICAR) 40 MG tablet Take 1 tablet (40 mg total) by mouth daily. (Patient taking differently: Take 40 mg by mouth daily.) 90 tablet 3   potassium chloride SA (KLOR-CON) 20 MEQ tablet Take 1 tablet (20 mEq total) by mouth daily. 90 tablet 11   repaglinide (PRANDIN) 2 MG tablet TAKE 1 TABLET BY MOUTH 3 TIMES A DAY BEFORE MEALS 270 tablet 3   tadalafil (CIALIS) 5 MG tablet Take 5 mg by mouth daily.       Scheduled Meds: Continuous Infusions:  sodium chloride 75 mL/hr at 09/16/21 0950   PRN Meds:.  Allergies:  Allergies  Allergen Reactions   Tamsulosin Other (See Comments)    Unable to perform sexually   Codeine Itching and Rash    Family History  Problem Relation Age of Onset   Lymphoma Mother    Colon cancer Mother 3   Colon cancer Paternal  Aunt    Esophageal cancer Neg Hx    Rectal cancer Neg Hx    Stomach cancer Neg Hx     Social History:  reports that he has been smoking cigarettes. He has a 7.50 pack-year smoking history. He has never used smokeless tobacco. He reports current drug use. Frequency: 4.00 times per week. Drug: Marijuana. He reports that he does not drink alcohol.  ROS: A complete review of systems was performed.  All systems are negative except for pertinent findings as noted.  Physical Exam:  Vital signs in last 24 hours: Temp:  [98.4 F (36.9 C)] 98.4 F (36.9 C) (12/03 0748) Pulse Rate:  [68-90] 80 (12/03 1015) Resp:  [16-18] 18 (12/03 1015) BP: (161-206)/(88-111) 161/88 (12/03 1015) SpO2:  [97 %-100 %]  97 % (12/03 1015) Weight:  [85 kg] 85 kg (12/03 0802) Constitutional:  Alert and oriented, No acute distress Cardiovascular: Regular rate and rhythm, No JVD Respiratory: Normal respiratory effort, Lungs clear bilaterally GI: Abdomen is soft, nontender, nondistended, no abdominal masses GU: 22Fr 3 way foley in place, CBI on moderate drip with light pink urine output  Lymphatic: No lymphadenopathy Neurologic: Grossly intact, no focal deficits Psychiatric: Normal mood and affect  Laboratory Data:  Recent Labs    09/16/21 0840  WBC 14.8*  HGB 17.8*  HCT 52.9*  PLT 176    Recent Labs    09/16/21 0840  NA 141  K 3.5  CL 111  GLUCOSE 276*  BUN 26*  CALCIUM 9.7  CREATININE 0.70     Results for orders placed or performed during the hospital encounter of 09/16/21 (from the past 24 hour(s))  Urinalysis, Routine w reflex microscopic Urine, Clean Catch     Status: Abnormal   Collection Time: 09/16/21  8:01 AM  Result Value Ref Range   Color, Urine RED (A) YELLOW   APPearance BLOODY (A) CLEAR   Specific Gravity, Urine  1.005 - 1.030    TEST NOT REPORTED DUE TO COLOR INTERFERENCE OF URINE PIGMENT   pH  5.0 - 8.0    TEST NOT REPORTED DUE TO COLOR INTERFERENCE OF URINE PIGMENT   Glucose,  UA (A) NEGATIVE mg/dL    TEST NOT REPORTED DUE TO COLOR INTERFERENCE OF URINE PIGMENT   Hgb urine dipstick (A) NEGATIVE    TEST NOT REPORTED DUE TO COLOR INTERFERENCE OF URINE PIGMENT   Bilirubin Urine (A) NEGATIVE    TEST NOT REPORTED DUE TO COLOR INTERFERENCE OF URINE PIGMENT   Ketones, ur (A) NEGATIVE mg/dL    TEST NOT REPORTED DUE TO COLOR INTERFERENCE OF URINE PIGMENT   Protein, ur (A) NEGATIVE mg/dL    TEST NOT REPORTED DUE TO COLOR INTERFERENCE OF URINE PIGMENT   Nitrite (A) NEGATIVE    TEST NOT REPORTED DUE TO COLOR INTERFERENCE OF URINE PIGMENT   Leukocytes,Ua (A) NEGATIVE    TEST NOT REPORTED DUE TO COLOR INTERFERENCE OF URINE PIGMENT  Comprehensive metabolic panel     Status: Abnormal   Collection Time: 09/16/21  8:40 AM  Result Value Ref Range   Sodium 141 135 - 145 mmol/L   Potassium 3.5 3.5 - 5.1 mmol/L   Chloride 111 98 - 111 mmol/L   CO2 20 (L) 22 - 32 mmol/L   Glucose, Bld 276 (H) 70 - 99 mg/dL   BUN 26 (H) 8 - 23 mg/dL   Creatinine, Ser 0.70 0.61 - 1.24 mg/dL   Calcium 9.7 8.9 - 10.3 mg/dL   Total Protein 7.5 6.5 - 8.1 g/dL   Albumin 4.5 3.5 - 5.0 g/dL   AST 14 (L) 15 - 41 U/L   ALT 30 0 - 44 U/L   Alkaline Phosphatase 116 38 - 126 U/L   Total Bilirubin 1.1 0.3 - 1.2 mg/dL   GFR, Estimated >60 >60 mL/min   Anion gap 10 5 - 15  CBC     Status: Abnormal   Collection Time: 09/16/21  8:40 AM  Result Value Ref Range   WBC 14.8 (H) 4.0 - 10.5 K/uL   RBC 6.03 (H) 4.22 - 5.81 MIL/uL   Hemoglobin 17.8 (H) 13.0 - 17.0 g/dL   HCT 52.9 (H) 39.0 - 52.0 %   MCV 87.7 80.0 - 100.0 fL   MCH 29.5 26.0 - 34.0 pg  MCHC 33.6 30.0 - 36.0 g/dL   RDW 14.7 11.5 - 15.5 %   Platelets 176 150 - 400 K/uL   nRBC 0.0 0.0 - 0.2 %  Type and screen [place]     Status: None   Collection Time: 09/16/21  8:40 AM  Result Value Ref Range   ABO/RH(D) O POS    Antibody Screen NEG    Sample Expiration      09/19/2021,2359 Performed at [facility] [address] [place],  [address]    No results found for this or any previous visit (from the past 240 hour(s)).  Renal Function: Recent Labs    09/16/21 0840  CREATININE 0.70   Estimated Creatinine Clearance: 91.6 mL/min (by C-G formula based on SCr of 0.7 mg/dL).  Radiologic Imaging: No results found.  I independently reviewed the above imaging studies.  Impression/Recommendation 65 y/o male with hx of DM, HTN, and non-muscle invasive bladder cancer s/p TURBT on 11/18 who presents in clot retention.   #Gross hematuria/ Clot retention  - s/p hand irrigation with ~300cc clot burden removed. Now with 22Fr 3 way hematuria catheter in place on CBI.  - Recommend admission for CBI and hematuria monitoring; hospitalist to admit  - Continue CBI and titrate to light pink. If catheter stops draining okay to hand irrigate to clear clots. If unable to clear clot please stop CBI and notify urology  - Okay for diet today. Make NPO at midnight tonight incase he needs OR for fulguration if no improvement in hematuria by tomorrow am - Daily Hgb monitoring  - Please send urine for culture; will cover with dose of CTX given significant bladder irrigation today.   Aldine Contes 09/16/2021, 10:55 AM

## 2021-09-16 NOTE — H&P (Signed)
History and Physical    [name] [name] TIR:[id number] DOB: [birth year] DOA: 09/16/2021  PCP: Cassandria Anger, MD  Patient coming from: Home.  I have personally briefly reviewed patient's old medical records in [name]  Chief Complaint: Gross hematuria.  HPI: [name] [name] is a 65 y.o. male with medical history significant of CAD, cervical DDD, type II DM, hyperlipidemia, GERD, colon polyps, history of nephrolithiasis, history of prostatitis, history of renal carcinoma, BPH, history of bladder cancer who underwent an uneventful transurethral resection of bladder tumor with gemcitabine on 09/01/2021.  Postop Foley catheter was removed without problems.  He was voiding without any difficulty.  He saw Dr. Jeffie Pollock for follow-up yesterday.  However, he has been experiencing burning sensation for the past week.  This morning the patient woke up with gross hematuria and intense discomfort in his bladder so he came to the emergency department.  ED Course: Initial vital signs were temperature 98.4 F, pulse 90, respirations 16, BP 176/109 mmHg.  99% on room.  The patient received hydromorphone 1 mg IVP, ondansetron 4 mg IVP and 1 g of ceftriaxone IVPB.  Lab work: His urinalysis was too bloody for analysis.  CBC showed a white count of 14.8, hemoglobin 17.8 g/dL and platelets 176.  CMP showed CO2 of 20 mmol/L, glucose of 276 and BUN was 26 mg/dL.  The rest of the CMP values were unremarkable.  Review of Systems: As per HPI otherwise all other systems reviewed and are negative.  Past Medical History:  Diagnosis Date   Benign localized prostatic hyperplasia with lower urinary tract symptoms (LUTS)    urologist--- dr Jeffie Pollock   Bladder cancer [facility])    urologist---- dr Jeffie Pollock---  s/p TURBT 11/2020; 03/ 2022   Coronary artery disease    DDD (degenerative disc disease), cervical    From 1980s   Diabetes mellitus type II    followed by pcp--   (01-05-2021  per pt checks blood sugar three  times per week,  fasting sugar --- 130--140_   Difficult intubation 07/28/2012   partial nephrectomy done _0  07-28-2012  found to have anterior larynx , diffcult intubation, referred to anesthesia record in epic    Dyslipidemia    ED (erectile dysfunction)    Elevated PSA    GERD (gastroesophageal reflux disease)    History of adenomatous polyp of colon    History of kidney stones    History of prostatitis 2008   History of renal carcinoma urologist--- dr Jeffie Pollock   07-28-2012 for renal neoplasm  s/p right partal nephrectomy    Hypertension    followed by pcp   Osteoarthritis    Back    Past Surgical History:  Procedure Laterality Date   COLONOSCOPY N/A 03/03/2013   Procedure: COLONOSCOPY;  Surgeon: Irene Shipper, MD;  [facility];  Service: Endoscopy;  Laterality: N/A;   COLONOSCOPY WITH PROPOFOL N/A 09/29/2019   Procedure: COLONOSCOPY WITH PROPOFOL;  Surgeon: Irene Shipper, MD;  [facility];  Service: Endoscopy;  Laterality: N/A;   CYSTOSCOPY WITH BIOPSY N/A 12/09/2020   Procedure: CYSTOSCOPY WITH TURBT, BIOPSY AND FULGURATION;  Surgeon: Irine Seal, MD;  [facility];  Service: Urology;  Laterality: N/A;  REQUESTING [place] LITHOTRIPSY  01-10-2015;  03-03-2012  _1    POLYPECTOMY  09/29/2019   Procedure: POLYPECTOMY;  Surgeon: Irene Shipper, MD;  [facility];  Service: Endoscopy;;   ROBOT ASSISTED LAPAROSCOPIC PARTIAL NEPHRECTOMY  07-28-2012  DR Alinda Money  _0    TONSILLECTOMY  1965   TRANSURETHRAL RESECTION OF BLADDER TUMOR WITH MITOMYCIN-C N/A 01/06/2021   Procedure: TRANSURETHRAL RESECTION OF BLADDER TUMOR WITH GEMCITIBINE IN PACU;  Surgeon: Irine Seal, MD;  Location: [facility];  Service: Urology;  Laterality: N/A;   TRANSURETHRAL RESECTION OF BLADDER TUMOR WITH MITOMYCIN-C N/A 09/01/2021   Procedure: TRANSURETHRAL RESECTION OF BLADDER TUMOR WITH POST OPERATIVE INSTILLATION OF GEMCITABINE;  Surgeon:  Irine Seal, MD;  Location: [facility];  Service: Urology;  Laterality: N/A;    Social History  reports that he has been smoking cigarettes. He has a 7.50 pack-year smoking history. He has never used smokeless tobacco. He reports current drug use. Frequency: 4.00 times per week. Drug: Marijuana. He reports that he does not drink alcohol.  Allergies  Allergen Reactions   Tamsulosin Other (See Comments)    Unable to perform sexually   Codeine Itching and Rash    Family History  Problem Relation Age of Onset   Lymphoma Mother    Colon cancer Mother 84   Colon cancer Paternal Aunt    Esophageal cancer Neg Hx    Rectal cancer Neg Hx    Stomach cancer Neg Hx    Prior to Admission medications   Medication Sig Start Date End Date Taking? Authorizing Provider  acetaminophen (TYLENOL) 325 MG tablet Take 650 mg by mouth every 6 (six) hours as needed.    [provider]  amLODipine (NORVASC) 5 MG tablet TAKE 1 TABLET BY MOUTH EVERY DAY Patient taking differently: Take 5 mg by mouth daily. 12/26/20   Plotnikov, Evie Lacks, MD  Ascorbic Acid (VITAMIN C) 1000 MG tablet Take 1,000 mg by mouth daily as needed (immunine).    [provider]  blood glucose meter kit and supplies KIT Use to test blood sugar once a day. DX: E11.9 12/19/16   Plotnikov, Evie Lacks, MD  Blood Glucose Monitoring Suppl (CONTOUR NEXT MONITOR) w/Device KIT 1 Device by Does not apply route daily. 12/08/19   Plotnikov, Evie Lacks, MD  cholecalciferol (VITAMIN D) 1000 UNITS tablet Take 1,000 Units by mouth daily.    [provider]  CONTOUR NEXT TEST test strip USE AS DIRECTED 03/21/20   Plotnikov, Evie Lacks, MD  Empagliflozin-metFORMIN HCl ER (SYNJARDY XR) 12.02-999 MG TB24 Take 1 tablet by mouth 2 (two) times daily. 08/03/21   Plotnikov, Evie Lacks, MD  finasteride (PROSCAR) 5 MG tablet TAKE 1 TABLET BY MOUTH EVERY DAY Patient taking differently: Take 5 mg by mouth daily. 12/26/20   Plotnikov,  Evie Lacks, MD  HYDROcodone-acetaminophen (NORCO/VICODIN) 5-325 MG tablet Take 1 tablet by mouth every 6 (six) hours as needed for moderate pain. 09/01/21 09/01/22  Irine Seal, MD  Lancets MISC Use to check blood sugar once per day. 12/19/16   Plotnikov, Evie Lacks, MD  olmesartan (BENICAR) 40 MG tablet Take 1 tablet (40 mg total) by mouth daily. Patient taking differently: Take 40 mg by mouth daily. 11/28/20   Plotnikov, Evie Lacks, MD  potassium chloride SA (KLOR-CON) 20 MEQ tablet Take 1 tablet (20 mEq total) by mouth daily. 09/06/20   Plotnikov, Evie Lacks, MD  repaglinide (PRANDIN) 2 MG tablet TAKE 1 TABLET BY MOUTH 3 TIMES A DAY BEFORE MEALS 03/27/21   Plotnikov, Evie Lacks, MD  tadalafil (CIALIS) 5 MG tablet Take 5 mg by mouth daily. 09/02/20   [provider]    Physical Exam: Vitals:   09/16/21 0802 09/16/21 0845 09/16/21 0930 09/16/21  1015  BP:  (!) 201/111 (!) 206/99 (!) 161/88  Pulse:  76 68 80  Resp:  _0 Temp:      TempSrc:      SpO2:  100% 99% 97%  Weight: 85 kg     Height: _1  (1.676 m)       Constitutional: NAD, calm, comfortable Eyes: PERRL, lids and conjunctivae normal ENMT: Mucous membranes are moist. Posterior pharynx clear of any exudate or lesions. Neck: normal, supple, no masses, no thyromegaly Respiratory: clear to auscultation bilaterally, no wheezing, no crackles. Normal respiratory effort. No accessory muscle use.  Cardiovascular: Regular rate and rhythm, no murmurs / rubs / gallops. No extremity edema. 2+ pedal pulses. No carotid bruits.  Abdomen: Obese, no distention.  Soft, no tenderness, no masses palpated. No hepatosplenomegaly. Bowel sounds positive.  Musculoskeletal: no clubbing / cyanosis. Good ROM, no contractures. Normal muscle tone.  Skin: no rashes, lesions, ulcers on limited dermatological examination. Neurologic: CN 2-12 grossly intact. Sensation intact, DTR normal. Strength 5/5 in all 4.  Psychiatric: Normal judgment and insight.  Alert and oriented x 3. Normal mood.   Labs on Admission: I have personally reviewed following labs and imaging studies  CBC: Recent Labs  Lab 09/16/21 0840  WBC 14.8*  HGB 17.8*  HCT 52.9*  MCV 87.7  PLT 025    Basic Metabolic Panel: Recent Labs  Lab 09/16/21 0840  NA 141  K 3.5  CL 111  CO2 20*  GLUCOSE 276*  BUN 26*  CREATININE 0.70  CALCIUM 9.7    GFR: Estimated Creatinine Clearance: 91.6 mL/min (by C-G formula based on SCr of 0.7 mg/dL).  Liver Function Tests: Recent Labs  Lab 09/16/21 0840  AST 14*  ALT 30  ALKPHOS 116  BILITOT 1.1  PROT 7.5  ALBUMIN 4.5    Urine analysis:    Component Value Date/Time   COLORURINE RED (A) 09/16/2021 0801   APPEARANCEUR BLOODY (A) 09/16/2021 0801   LABSPEC  09/16/2021 0801    TEST NOT REPORTED DUE TO COLOR INTERFERENCE OF URINE PIGMENT   PHURINE  09/16/2021 0801    TEST NOT REPORTED DUE TO COLOR INTERFERENCE OF URINE PIGMENT   GLUCOSEU (A) 09/16/2021 0801    TEST NOT REPORTED DUE TO COLOR INTERFERENCE OF URINE PIGMENT   GLUCOSEU >=1000 (A) 03/06/2021 0834   HGBUR (A) 09/16/2021 0801    TEST NOT REPORTED DUE TO COLOR INTERFERENCE OF URINE PIGMENT   BILIRUBINUR (A) 09/16/2021 0801    TEST NOT REPORTED DUE TO COLOR INTERFERENCE OF URINE PIGMENT   KETONESUR (A) 09/16/2021 0801    TEST NOT REPORTED DUE TO COLOR INTERFERENCE OF URINE PIGMENT   PROTEINUR (A) 09/16/2021 0801    TEST NOT REPORTED DUE TO COLOR INTERFERENCE OF URINE PIGMENT   UROBILINOGEN 0.2 03/06/2021 0834   NITRITE (A) 09/16/2021 0801    TEST NOT REPORTED DUE TO COLOR INTERFERENCE OF URINE PIGMENT   LEUKOCYTESUR (A) 09/16/2021 0801    TEST NOT REPORTED DUE TO COLOR INTERFERENCE OF URINE PIGMENT    Radiological Exams on Admission: No results found.  EKG: Independently reviewed.   Assessment/Plan Principal Problem:   Gross hematuria Observation/MedSurg. Keep continue bladder irrigation. As needed.  5 antiemetics as needed. Urology consult  appreciated. Monitor hematocrit and hemoglobin. Transfuse if needed.  Active Problems:   DM type 2, controlled, with complication (HCC) Carbohydrate modified diet. Continue Synjardy XR twice daily. Continue Prandin 2 mg before meals. CBG monitoring with RI SS.  Essential hypertension Hold amlodipine. Continue olmesartan 40 mg or formulary equivalent. Monitor BP, renal function electrolytes.    Dyslipidemia Currently not on medical therapy. Follow-up with PCP.    Polycythemia, secondary Smoking cessation advised. Monitor H&H. Follow-up with PCP.    Tobacco use disorder Smoking cessation advised. Declined nicotine replacement therapy.    Coronary atherosclerosis No statin, beta-blocker or aspirin. Aspirin relatively contraindicated this time. Follow-up with cardiology as scheduled. Smoking cessation advised.    DVT prophylaxis: SCDs. Code Status:   Full code. Family Communication:   Disposition Plan:   Patient is from:  Home.  Anticipated DC to:  Home.  Anticipated DC date:  09/17/2021 or 09/18/2021.  Anticipated DC barriers: Clinical status.  Consults called:  Routine neurology consult Aldine Contes, MD). Admission status:  Observation/MedSurg.   Severity of Illness: High severity after presenting with suprapubic pain and gross hematuria.  The patient will remain in the hospital for continuous bladder irrigation, pain control and H&H monitoring.  Reubin Milan MD [facility]  How to contact the [facility] Attending or Consulting provider [place] or covering provider during after hours [place], for this patient?   Check the care team in [facility] and look for a) attending/consulting [facility] provider listed and b) the [facility] team listed Log into [email or web address] and use [place]'s universal password to access. If you do not have the password, please contact the hospital operator. Locate the [facility] provider you are looking for under [facility] and page to a number  that you can be directly reached. If you still have difficulty reaching the provider, please page the [facility] (Director on Call) for the Hospitalists listed on amion for assistance.  09/16/2021, 11:18 AM   This document was prepared using Dragon voice recognition software and may contain some unintended transcription errors.

## 2021-09-16 NOTE — ED Triage Notes (Signed)
Patient reports he had cancerous tumors removed off of bladder last week, has had burning sensation on and off all week, over night patient developed blood in urine. Pain rated 8/10

## 2021-09-16 NOTE — ED Provider Notes (Signed)
[facility] DEPT Provider Note   CSN: [id number] Arrival date & time: 09/16/21  3557     History Chief Complaint  Patient presents with   Hematuria    [name] [name] is a 65 y.o. male.  Patient is followed by Dr. Jeffie Pollock from urology.  On November 18 had a look at his bladder which showed 2 new bladder lesions felt to be recurrent bladder cancer.  They did a TURBT and instillation of Gemcitabine.  3 this morning patient woke up with a ton of blood from his bladder.  Including clots.  That were hanging.  Patient states this has not happened before.      Past Medical History:  Diagnosis Date   Benign localized prostatic hyperplasia with lower urinary tract symptoms (LUTS)    urologist--- dr Jeffie Pollock   Bladder cancer [facility])    urologist---- dr Jeffie Pollock---  s/p TURBT 11/2020; 03/ 2022   Coronary artery disease    DDD (degenerative disc disease), cervical    From 1980s   Diabetes mellitus type II    followed by pcp--   (01-05-2021  per pt checks blood sugar three times per week,  fasting sugar --- 130--140_   Difficult intubation 07/28/2012   partial nephrectomy done $RemoveBeforeDE'@[facility]'AmPNktgjJcmqTxK$  07-28-2012  found to have anterior larynx , diffcult intubation, referred to anesthesia record in epic    Dyslipidemia    ED (erectile dysfunction)    Elevated PSA    GERD (gastroesophageal reflux disease)    History of adenomatous polyp of colon    History of kidney stones    History of prostatitis 2008   History of renal carcinoma urologist--- dr Jeffie Pollock   07-28-2012 for renal neoplasm  s/p right partal nephrectomy    Hypertension    followed by pcp   Osteoarthritis    Back    Patient Active Problem List   Diagnosis Date Noted   Gross hematuria 09/16/2021   Statin declined 08/06/2021   Acute abscess of face 08/03/2021   Atherosclerosis of aorta ([place]) 04/26/2021   Coronary atherosclerosis 01/08/2021   Ascending aortic aneurysm 01/08/2021   Bladder neck obstruction 10/20/2019    History of colonic polyps    Benign neoplasm of ascending colon    Benign neoplasm of transverse colon    Viral illness 06/16/2019   Exposure to COVID-19 virus 02/02/2019   Cerumen impaction 08/27/2018   Pilonidal cyst 01/18/2017   Neoplasm of uncertain behavior of skin 01/18/2017   Hypokalemia 12/11/2016   Tobacco use disorder 10/17/2016   Well adult exam 06/22/2016   Polycythemia, 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   Abscessed tooth 07/06/2015   Sebaceous cyst 07/06/2015   Common cold virus 12/23/2014   Infected sebaceous cyst 11/09/2014   Dyslipidemia 05/19/2013   URI, acute 09/15/2012   Strep throat 09/15/2012   Renal cell cancer ([place]) 09/02/2012   Adrenal mass ([place]) 06/03/2012   Nephrolithiasis 06/03/2012   Otitis media 01/08/2012   Dark urine 01/08/2012   Decreased vision in both eyes 01/08/2012   Onychomycosis 05/25/2011   DM type 2, controlled, with complication ([place]) 32/20/2542   Keloid scar 03/07/2010   SKIN RASH 03/07/2010   LACERATION, FINGER 09/15/2009   Osteoarthritis 08/15/2009   LOW BACK PAIN 08/15/2009   CELLULITIS AND ABSCESS OF BUTTOCK 03/29/2009   CERVICAL RADICULOPATHY 11/09/2008   WARTS, VIRAL, UNSPECIFIED 10/28/2007   Essential hypertension 06/30/2007   CLUSTER HEADACHE 06/27/2007   PSA, INCREASED 06/27/2007    Past Surgical History:  Procedure Laterality Date  COLONOSCOPY N/A 03/03/2013   Procedure: COLONOSCOPY;  Surgeon: Irene Shipper, MD;  Location: [facility];  Service: Endoscopy;  Laterality: N/A;   COLONOSCOPY WITH PROPOFOL N/A 09/29/2019   Procedure: COLONOSCOPY WITH PROPOFOL;  Surgeon: Irene Shipper, MD;  Location: [facility];  Service: Endoscopy;  Laterality: N/A;   CYSTOSCOPY WITH BIOPSY N/A 12/09/2020   Procedure: CYSTOSCOPY WITH TURBT, BIOPSY AND FULGURATION;  Surgeon: Irine Seal, MD;  Location: [facility];  Service: Urology;  Laterality: N/A;  REQUESTING [place] LITHOTRIPSY  01-10-2015;  03-03-2012  $RemoveBef'@[facility]'cGudQPeuSp$    POLYPECTOMY   09/29/2019   Procedure: POLYPECTOMY;  Surgeon: Irene Shipper, MD;  Location: [facility];  Service: Endoscopy;;   ROBOT ASSISTED LAPAROSCOPIC PARTIAL NEPHRECTOMY  07-28-2012   DR Alinda Money  $Remo'@[facility]'Ntvth$    [place] OF BLADDER TUMOR WITH MITOMYCIN-C N/A 01/06/2021   Procedure: TRANSURETHRAL RESECTION OF BLADDER TUMOR WITH GEMCITIBINE IN PACU;  Surgeon: Irine Seal, MD;  Location: [facility];  Service: Urology;  Laterality: N/A;   TRANSURETHRAL RESECTION OF BLADDER TUMOR WITH MITOMYCIN-C N/A 09/01/2021   Procedure: TRANSURETHRAL RESECTION OF BLADDER TUMOR WITH POST OPERATIVE INSTILLATION OF GEMCITABINE;  Surgeon: Irine Seal, MD;  Location: [facility];  Service: Urology;  Laterality: N/A;       Family History  Problem Relation Age of Onset   Lymphoma Mother    Colon cancer Mother 65   Colon cancer Paternal Aunt    Esophageal cancer Neg Hx    Rectal cancer Neg Hx    Stomach cancer Neg Hx     Social History   Tobacco Use   Smoking status: Every Day    Packs/day: 0.25    Years: 30.00    Pack years: 7.50    Types: Cigarettes   Smokeless tobacco: Never  Vaping Use   Vaping Use: Never used  Substance Use Topics   Alcohol use: No    Comment: less than 1 beer a month   Drug use: Yes    Frequency: 4.0 times per week    Types: Marijuana    Comment: marijuana-smokes daily, last 08/31/21    Home Medications Prior to Admission medications   Medication Sig Start Date End Date Taking? Authorizing Provider  acetaminophen (TYLENOL) 325 MG tablet Take 650 mg by mouth every 6 (six) hours as needed.    [provider]  amLODipine (NORVASC) 5 MG tablet TAKE 1 TABLET BY MOUTH EVERY DAY Patient taking differently: Take 5 mg by mouth daily. 12/26/20   Plotnikov, Evie Lacks, MD  Ascorbic Acid (VITAMIN C) 1000 MG tablet Take 1,000 mg by mouth daily as needed (immunine).    [provider]  blood glucose meter kit and  supplies KIT Use to test blood sugar once a day. DX: E11.9 12/19/16   Plotnikov, Evie Lacks, MD  Blood Glucose Monitoring Suppl (CONTOUR NEXT MONITOR) w/Device KIT 1 Device by Does not apply route daily. 12/08/19   Plotnikov, Evie Lacks, MD  cholecalciferol (VITAMIN D) 1000 UNITS tablet Take 1,000 Units by mouth daily.    [provider]  CONTOUR NEXT TEST test strip USE AS DIRECTED 03/21/20   Plotnikov, Evie Lacks, MD  Empagliflozin-metFORMIN HCl ER (SYNJARDY XR) 12.02-999 MG TB24 Take 1 tablet by mouth 2 (two) times daily. 08/03/21   Plotnikov, Evie Lacks, MD  finasteride (PROSCAR) 5 MG tablet TAKE 1 TABLET BY MOUTH EVERY DAY Patient taking differently: Take 5 mg by mouth daily.  12/26/20   Plotnikov, Evie Lacks, MD  HYDROcodone-acetaminophen (NORCO/VICODIN) 5-325 MG tablet Take 1 tablet by mouth every 6 (six) hours as needed for moderate pain. 09/01/21 09/01/22  Irine Seal, MD  Lancets MISC Use to check blood sugar once per day. 12/19/16   Plotnikov, Evie Lacks, MD  olmesartan (BENICAR) 40 MG tablet Take 1 tablet (40 mg total) by mouth daily. Patient taking differently: Take 40 mg by mouth daily. 11/28/20   Plotnikov, Evie Lacks, MD  potassium chloride SA (KLOR-CON) 20 MEQ tablet Take 1 tablet (20 mEq total) by mouth daily. 09/06/20   Plotnikov, Evie Lacks, MD  repaglinide (PRANDIN) 2 MG tablet TAKE 1 TABLET BY MOUTH 3 TIMES A DAY BEFORE MEALS 03/27/21   Plotnikov, Evie Lacks, MD  tadalafil (CIALIS) 5 MG tablet Take 5 mg by mouth daily. 09/02/20   [provider]    Allergies    Tamsulosin and Codeine  Review of Systems   Review of Systems  Constitutional:  Negative for chills and fever.  HENT:  Negative for ear pain and sore throat.   Eyes:  Negative for pain and visual disturbance.  Respiratory:  Negative for cough and shortness of breath.   Cardiovascular:  Negative for chest pain and palpitations.  Gastrointestinal:  Negative for abdominal pain and vomiting.  Genitourinary:  Positive  for difficulty urinating and hematuria. Negative for dysuria.  Musculoskeletal:  Negative for arthralgias and back pain.  Skin:  Negative for color change and rash.  Neurological:  Negative for seizures and syncope.  All other systems reviewed and are negative.  Physical Exam Updated Vital Signs BP (!) 161/88   Pulse 80   Temp 98.4 F (36.9 C) (Oral)   Resp 18   Ht 1.676 m ($Remove'5\' 6"'cgQzKNL$ )   Wt 85 kg   SpO2 97%   BMI 30.25 kg/m   Physical Exam Vitals and nursing note reviewed.  Constitutional:      General: He is not in acute distress.    Appearance: Normal appearance. He is well-developed.  HENT:     Head: Normocephalic and atraumatic.  Eyes:     Extraocular Movements: Extraocular movements intact.     Conjunctiva/sclera: Conjunctivae normal.     Pupils: Pupils are equal, round, and reactive to light.  Cardiovascular:     Rate and Rhythm: Normal rate and regular rhythm.     Heart sounds: No murmur heard. Pulmonary:     Effort: Pulmonary effort is normal. No respiratory distress.     Breath sounds: Normal breath sounds.  Abdominal:     Palpations: Abdomen is soft.     Tenderness: There is no abdominal tenderness.  Genitourinary:    Comments: Blood at the tip of the meatus.  Otherwise no GU abnormalities no lesions on the penis.  Scrotum is not swollen. Musculoskeletal:        General: No swelling.     Cervical back: Normal range of motion and neck supple.  Skin:    General: Skin is warm and dry.     Capillary Refill: Capillary refill takes less than 2 seconds.  Neurological:     General: No focal deficit present.     Mental Status: He is alert and oriented to person, place, and time.  Psychiatric:        Mood and Affect: Mood normal.    ED Results / Procedures / Treatments   Labs (all labs ordered are listed, but only abnormal results are displayed) Labs Reviewed  URINALYSIS, ROUTINE  W REFLEX MICROSCOPIC - Abnormal; Notable for the following components:      Result  Value   Color, Urine RED (*)    APPearance BLOODY (*)    Glucose, UA   (*)    Value: TEST NOT REPORTED DUE TO COLOR INTERFERENCE OF URINE PIGMENT   Hgb urine dipstick   (*)    Value: TEST NOT REPORTED DUE TO COLOR INTERFERENCE OF URINE PIGMENT   Bilirubin Urine   (*)    Value: TEST NOT REPORTED DUE TO COLOR INTERFERENCE OF URINE PIGMENT   Ketones, ur   (*)    Value: TEST NOT REPORTED DUE TO COLOR INTERFERENCE OF URINE PIGMENT   Protein, ur   (*)    Value: TEST NOT REPORTED DUE TO COLOR INTERFERENCE OF URINE PIGMENT   Nitrite   (*)    Value: TEST NOT REPORTED DUE TO COLOR INTERFERENCE OF URINE PIGMENT   Leukocytes,Ua   (*)    Value: TEST NOT REPORTED DUE TO COLOR INTERFERENCE OF URINE PIGMENT   All other components within normal limits  COMPREHENSIVE METABOLIC PANEL - Abnormal; Notable for the following components:   CO2 20 (*)    Glucose, Bld 276 (*)    BUN 26 (*)    AST 14 (*)    All other components within normal limits  CBC - Abnormal; Notable for the following components:   WBC 14.8 (*)    RBC 6.03 (*)    Hemoglobin 17.8 (*)    HCT 52.9 (*)    All other components within normal limits  RESP PANEL BY RT-PCR (FLU A&B, COVID) ARPGX2  URINE CULTURE  HEMOGLOBIN AND HEMATOCRIT, BLOOD  TYPE AND SCREEN  TYPE AND SCREEN    EKG None  Radiology No results found.  Procedures Procedures   Medications Ordered in ED Medications  0.9 %  sodium chloride infusion ( Intravenous New Bag/Given 09/16/21 0950)  sodium chloride irrigation 0.9 % 3,000 mL (has no administration in time range)  cefTRIAXone (ROCEPHIN) 1 g in sodium chloride 0.9 % 100 mL IVPB (has no administration in time range)  acetaminophen (TYLENOL) tablet 650 mg (has no administration in time range)    Or  acetaminophen (TYLENOL) suppository 650 mg (has no administration in time range)  ondansetron (ZOFRAN) tablet 4 mg (has no administration in time range)    Or  ondansetron (ZOFRAN) injection 4 mg (has no  administration in time range)  oxyCODONE (Oxy IR/ROXICODONE) immediate release tablet 5 mg (has no administration in time range)  insulin aspart (novoLOG) injection 0-15 Units (has no administration in time range)  ondansetron (ZOFRAN) injection 4 mg (4 mg Intravenous Given 09/16/21 0950)  HYDROmorphone (DILAUDID) injection 1 mg (1 mg Intravenous Given 09/16/21 0950)    ED Course  I have reviewed the triage vital signs and the nursing notes.  Pertinent labs & imaging results that were available during my care of the patient were reviewed by me and considered in my medical decision making (see chart for details).    MDM Rules/Calculators/A&P                           Discussed with urology resident on-call.  We placed a irrigation Foley catheter they came in and irrigated things are now clear.  Recommended admission by hospitalist service overnight to be monitored and urology will continue to consult.  Patient's renal function is good GFR is greater than 60.  White blood cell  count was 14.8 and hemoglobin 17.8 to start.  Also given some IV fluids and some pain medication feeling much better.   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

## 2021-09-16 NOTE — ED Notes (Signed)
1450 cc's urine removed from catheter bag.

## 2021-09-16 NOTE — ED Notes (Signed)
Urologist at bedside, irrigating bladder.

## 2021-09-17 DIAGNOSIS — E118 Type 2 diabetes mellitus with unspecified complications: Secondary | ICD-10-CM | POA: Diagnosis present

## 2021-09-17 DIAGNOSIS — Z807 Family history of other malignant neoplasms of lymphoid, hematopoietic and related tissues: Secondary | ICD-10-CM | POA: Diagnosis not present

## 2021-09-17 DIAGNOSIS — Z800 Family history of malignant neoplasm of digestive organs: Secondary | ICD-10-CM | POA: Diagnosis not present

## 2021-09-17 DIAGNOSIS — N9989 Other postprocedural complications and disorders of genitourinary system: Secondary | ICD-10-CM | POA: Diagnosis present

## 2021-09-17 DIAGNOSIS — Z885 Allergy status to narcotic agent status: Secondary | ICD-10-CM | POA: Diagnosis not present

## 2021-09-17 DIAGNOSIS — I2510 Atherosclerotic heart disease of native coronary artery without angina pectoris: Secondary | ICD-10-CM | POA: Diagnosis present

## 2021-09-17 DIAGNOSIS — Z87442 Personal history of urinary calculi: Secondary | ICD-10-CM | POA: Diagnosis not present

## 2021-09-17 DIAGNOSIS — Z888 Allergy status to other drugs, medicaments and biological substances status: Secondary | ICD-10-CM | POA: Diagnosis not present

## 2021-09-17 DIAGNOSIS — Z20822 Contact with and (suspected) exposure to covid-19: Secondary | ICD-10-CM | POA: Diagnosis present

## 2021-09-17 DIAGNOSIS — F17210 Nicotine dependence, cigarettes, uncomplicated: Secondary | ICD-10-CM | POA: Diagnosis present

## 2021-09-17 DIAGNOSIS — D751 Secondary polycythemia: Secondary | ICD-10-CM | POA: Diagnosis present

## 2021-09-17 DIAGNOSIS — Z905 Acquired absence of kidney: Secondary | ICD-10-CM | POA: Diagnosis not present

## 2021-09-17 DIAGNOSIS — I10 Essential (primary) hypertension: Secondary | ICD-10-CM | POA: Diagnosis present

## 2021-09-17 DIAGNOSIS — Z79899 Other long term (current) drug therapy: Secondary | ICD-10-CM | POA: Diagnosis not present

## 2021-09-17 DIAGNOSIS — Z8719 Personal history of other diseases of the digestive system: Secondary | ICD-10-CM | POA: Diagnosis not present

## 2021-09-17 DIAGNOSIS — Z85528 Personal history of other malignant neoplasm of kidney: Secondary | ICD-10-CM | POA: Diagnosis not present

## 2021-09-17 DIAGNOSIS — C671 Malignant neoplasm of dome of bladder: Secondary | ICD-10-CM | POA: Diagnosis present

## 2021-09-17 DIAGNOSIS — Y838 Other surgical procedures as the cause of abnormal reaction of the patient, or of later complication, without mention of misadventure at the time of the procedure: Secondary | ICD-10-CM | POA: Diagnosis present

## 2021-09-17 DIAGNOSIS — R310 Gross hematuria: Secondary | ICD-10-CM | POA: Diagnosis present

## 2021-09-17 DIAGNOSIS — E785 Hyperlipidemia, unspecified: Secondary | ICD-10-CM | POA: Diagnosis present

## 2021-09-17 DIAGNOSIS — Z7984 Long term (current) use of oral hypoglycemic drugs: Secondary | ICD-10-CM | POA: Diagnosis not present

## 2021-09-17 DIAGNOSIS — K219 Gastro-esophageal reflux disease without esophagitis: Secondary | ICD-10-CM | POA: Diagnosis present

## 2021-09-17 DIAGNOSIS — N400 Enlarged prostate without lower urinary tract symptoms: Secondary | ICD-10-CM | POA: Diagnosis present

## 2021-09-17 LAB — CBC
HCT: 43.3 % (ref 39.0–52.0)
Hemoglobin: 14.6 g/dL (ref 13.0–17.0)
MCH: 29.6 pg (ref 26.0–34.0)
MCHC: 33.7 g/dL (ref 30.0–36.0)
MCV: 87.7 fL (ref 80.0–100.0)
Platelets: 154 10*3/uL (ref 150–400)
RBC: 4.94 MIL/uL (ref 4.22–5.81)
RDW: 14.8 % (ref 11.5–15.5)
WBC: 11.8 10*3/uL — ABNORMAL HIGH (ref 4.0–10.5)
nRBC: 0 % (ref 0.0–0.2)

## 2021-09-17 LAB — CBC WITH DIFFERENTIAL/PLATELET
Abs Immature Granulocytes: 0.16 10*3/uL — ABNORMAL HIGH (ref 0.00–0.07)
Basophils Absolute: 0.1 10*3/uL (ref 0.0–0.1)
Basophils Relative: 1 %
Eosinophils Absolute: 0.3 10*3/uL (ref 0.0–0.5)
Eosinophils Relative: 3 %
HCT: 43.3 % (ref 39.0–52.0)
Hemoglobin: 14.5 g/dL (ref 13.0–17.0)
Immature Granulocytes: 2 %
Lymphocytes Relative: 17 %
Lymphs Abs: 1.7 10*3/uL (ref 0.7–4.0)
MCH: 29.7 pg (ref 26.0–34.0)
MCHC: 33.5 g/dL (ref 30.0–36.0)
MCV: 88.5 fL (ref 80.0–100.0)
Monocytes Absolute: 1 10*3/uL (ref 0.1–1.0)
Monocytes Relative: 10 %
Neutro Abs: 6.7 10*3/uL (ref 1.7–7.7)
Neutrophils Relative %: 67 %
Platelets: 158 10*3/uL (ref 150–400)
RBC: 4.89 MIL/uL (ref 4.22–5.81)
RDW: 14.9 % (ref 11.5–15.5)
WBC: 10 10*3/uL (ref 4.0–10.5)
nRBC: 0 % (ref 0.0–0.2)

## 2021-09-17 LAB — GLUCOSE, CAPILLARY
Glucose-Capillary: 159 mg/dL — ABNORMAL HIGH (ref 70–99)
Glucose-Capillary: 204 mg/dL — ABNORMAL HIGH (ref 70–99)
Glucose-Capillary: 290 mg/dL — ABNORMAL HIGH (ref 70–99)
Glucose-Capillary: 179 mg/dL — ABNORMAL HIGH (ref 70–99)
Glucose-Capillary: 118 mg/dL — ABNORMAL HIGH (ref 70–99)
Glucose-Capillary: 119 mg/dL — ABNORMAL HIGH (ref 70–99)

## 2021-09-17 LAB — URINE CULTURE: Culture: <10000 — AB

## 2021-09-17 LAB — HEMOGLOBIN A1C
Hgb A1c MFr Bld: 7.5 % — ABNORMAL HIGH (ref 4.8–5.6)
Mean Plasma Glucose: 168.55 mg/dL

## 2021-09-17 LAB — BASIC METABOLIC PANEL
Anion gap: 5 (ref 5–15)
BUN: 18 mg/dL (ref 8–23)
CO2: 21 mmol/L — ABNORMAL LOW (ref 22–32)
Calcium: 8.3 mg/dL — ABNORMAL LOW (ref 8.9–10.3)
Chloride: 112 mmol/L — ABNORMAL HIGH (ref 98–111)
Creatinine, Ser: 0.75 mg/dL (ref 0.61–1.24)
GFR, Estimated: >60 mL/min (ref >60)
Glucose, Bld: 197 mg/dL — ABNORMAL HIGH (ref 70–99)
Potassium: 3.6 mmol/L (ref 3.5–5.1)
Sodium: 138 mmol/L (ref 135–145)

## 2021-09-17 LAB — MAGNESIUM: Magnesium: 2.2 mg/dL (ref 1.7–2.4)

## 2021-09-17 LAB — HIV ANTIBODY (ROUTINE TESTING W REFLEX): HIV Screen 4th Generation wRfx: NONREACTIVE

## 2021-09-17 MED ORDER — AMLODIPINE BESYLATE 5 MG PO TABS
5.0000 mg | ORAL_TABLET | Freq: Every day | ORAL | Status: DC
  Administered 2021-09-17 – 2021-09-18 (×2): 5 mg via ORAL
  Filled 2021-09-17 (×2): qty 1

## 2021-09-17 MED ORDER — OXYCODONE HCL 5 MG PO TABS
5.0000 mg | ORAL_TABLET | Freq: Once | ORAL | Status: DC
  Filled 2021-09-17: qty 1

## 2021-09-17 NOTE — Progress Notes (Signed)
PROGRESS NOTE  [name] [name] VHQ:[id number] DOB: 05-Mar-1956 DOA: 09/16/2021 PCP: Cassandria Anger, MD  HPI/Recap of past 24 hours: This is a 65 year old male with medical history significant for coronary disease, cervical degenerative changes, type 2 diabetes mellitus, hyperlipidemia, GERD, colon polyp, history of nephrolithiasis, history of prostatitis, history of renal carcinoma BPH and bladder cancer who underwent an uneventful transurethral resection of bladder tumor with gemcitabine on September 01, 2021, his Foley catheter was removed without any problem he was voiding well without any difficulty until yesterday when he started to have burning sensation and then this morning he woke up with gross hematuria and intense discomfort in his bladder and so he came to be evaluated in the emergency department.  Patient seen and examined at bedside he denies any pain now he currently has a Foley catheter with dark pink urine draining.  Assessment/Plan: Principal Problem:   Gross hematuria Active Problems:   DM type 2, controlled, with complication (HCC)   Essential hypertension   Dyslipidemia   Polycythemia, secondary   Tobacco use disorder   Coronary atherosclerosis  #1 gross hematuria.  This morning his urine had turned slightly pink and he was being considered for discharge with Foley catheter but later in the afternoon his urine turned dark red again and so bloody retailed irrigation was restarted.  Type 2 diabetes mellitus Continue carb modified diet continue current medications per home Continue cover with sliding scale insulin  3.  Essential hypertension this is uncontrolled his amlodipine was on hold ,he was continued on oral olmesartan 40 mg daily and he was being covered with hydralazine injection for elevated blood pressure but that was not keeping his blood pressure down We will restart his amlodipine  4.  Dyslipidemia Continue diet treatment he is not on any  medicine  5.  Polycythemia Monitor H&H Follow-up PCP Likely secondary to smoking  6.  Coronary artery disease. Patient is not on statin beta-blocker or aspirin Patient aspirin is contraindicated due to his hematuria Follow-up with cardiology as scheduled  Code Status: Full  Severity of Illness: The appropriate patient status for this patient is INPATIENT. Inpatient status is judged to be reasonable and necessary in order to provide the required intensity of service to ensure the patient's safety. The patient's presenting symptoms, physical exam findings, and initial radiographic and laboratory data in the context of their chronic comorbidities is felt to place them at high risk for further clinical deterioration. Furthermore, it is not anticipated that the patient will be medically stable for discharge from the hospital within 2 midnights of admission.  She still has gross hematuria and requiring continuous bladder irrigation * I certify that at the point of admission it is my clinical judgment that the patient will require inpatient hospital care spanning beyond 2 midnights from the point of admission due to high intensity of service, high risk for further deterioration and high frequency of surveillance required.*   Family Communication: None at bedside  Disposition Plan: Home when stable and he does not have dark red hematuria urine anymore Status is: Inpatient   Dispo: The patient is from: Home              Anticipated d/c is to:               Anticipated d/c date is:               Patient currently not medically stable for discharge  Consultants: Urology  Procedures: Continuous bladder  irrigation  Antimicrobials: None  DVT prophylaxis: SCD   Objective: Vitals:   09/16/21 1800 09/16/21 2127 09/17/21 0146 09/17/21 0549  BP: (!) 143/78 (!) 167/85 (!) 154/84 (!) 148/83  Pulse: 71 79 72 70  Resp: 17 18 18 18   Temp:  98.4 F (36.9 C) 98.6 F (37 C) 98.7 F (37.1 C)   TempSrc:  Oral Oral Oral  SpO2: 97% 98% 97% 98%  Weight:      Height:        Intake/Output Summary (Last 24 hours) at 09/17/2021 0909 Last data filed at 09/17/2021 0847 Gross per 24 hour  Intake 5428.26 ml  Output 38950 ml  Net -33521.74 ml   Filed Weights   09/16/21 0802  Weight: 85 kg   Body mass index is 30.25 kg/m.  Exam:  General: 65 y.o. year-old male well developed well nourished in no acute distress.  Alert and oriented x3. Cardiovascular: Regular rate and rhythm with no rubs or gallops.  No thyromegaly or JVD noted.   Respiratory: Clear to auscultation with no wheezes or rales. Good inspiratory effort. Abdomen: Soft nontender nondistended with normal bowel sounds x4 quadrants. Musculoskeletal: No lower extremity edema. 2/4 pulses in all 4 extremities. Skin: No ulcerative lesions noted or rashes, Psychiatry: Mood is appropriate for condition and setting GU: Patient has a Foley catheter with dark pink urine draining    Data Reviewed: CBC: Recent Labs  Lab 09/16/21 0840 09/16/21 2020 09/17/21 0454  WBC 14.8*  --  11.8*  HGB 17.8* 14.8 14.6  HCT 52.9* 44.1 43.3  MCV 87.7  --  87.7  PLT 176  --  474   Basic Metabolic Panel: Recent Labs  Lab 09/16/21 0840 09/17/21 0454  NA 141 138  K 3.5 3.6  CL 111 112*  CO2 20* 21*  GLUCOSE 276* 197*  BUN 26* 18  CREATININE 0.70 0.75  CALCIUM 9.7 8.3*  MG  --  2.2   GFR: Estimated Creatinine Clearance: 91.6 mL/min (by C-G formula based on SCr of 0.75 mg/dL). Liver Function Tests: Recent Labs  Lab 09/16/21 0840  AST 14*  ALT 30  ALKPHOS 116  BILITOT 1.1  PROT 7.5  ALBUMIN 4.5   No results for input(s): LIPASE, AMYLASE in the last 168 hours. No results for input(s): AMMONIA in the last 168 hours. Coagulation Profile: No results for input(s): INR, PROTIME in the last 168 hours. Cardiac Enzymes: No results for input(s): CKTOTAL, CKMB, CKMBINDEX, TROPONINI in the last 168 hours. BNP (last 3 results) No  results for input(s): PROBNP in the last 8760 hours. HbA1C: No results for input(s): HGBA1C in the last 72 hours. CBG: Recent Labs  Lab 09/16/21 1230 09/16/21 2020  GLUCAP 196* 224*   Lipid Profile: No results for input(s): CHOL, HDL, LDLCALC, TRIG, CHOLHDL, LDLDIRECT in the last 72 hours. Thyroid Function Tests: No results for input(s): TSH, T4TOTAL, FREET4, T3FREE, THYROIDAB in the last 72 hours. Anemia Panel: No results for input(s): VITAMINB12, FOLATE, FERRITIN, TIBC, IRON, RETICCTPCT in the last 72 hours. Urine analysis:    Component Value Date/Time   COLORURINE RED (A) 09/16/2021 0801   APPEARANCEUR BLOODY (A) 09/16/2021 0801   LABSPEC  09/16/2021 0801    TEST NOT REPORTED DUE TO COLOR INTERFERENCE OF URINE PIGMENT   PHURINE  09/16/2021 0801    TEST NOT REPORTED DUE TO COLOR INTERFERENCE OF URINE PIGMENT   GLUCOSEU (A) 09/16/2021 0801    TEST NOT REPORTED DUE TO COLOR INTERFERENCE OF URINE PIGMENT  GLUCOSEU >=1000 (A) 03/06/2021 0834   HGBUR (A) 09/16/2021 0801    TEST NOT REPORTED DUE TO COLOR INTERFERENCE OF URINE PIGMENT   BILIRUBINUR (A) 09/16/2021 0801    TEST NOT REPORTED DUE TO COLOR INTERFERENCE OF URINE PIGMENT   KETONESUR (A) 09/16/2021 0801    TEST NOT REPORTED DUE TO COLOR INTERFERENCE OF URINE PIGMENT   PROTEINUR (A) 09/16/2021 0801    TEST NOT REPORTED DUE TO COLOR INTERFERENCE OF URINE PIGMENT   UROBILINOGEN 0.2 03/06/2021 0834   NITRITE (A) 09/16/2021 0801    TEST NOT REPORTED DUE TO COLOR INTERFERENCE OF URINE PIGMENT   LEUKOCYTESUR (A) 09/16/2021 0801    TEST NOT REPORTED DUE TO COLOR INTERFERENCE OF URINE PIGMENT   Sepsis Labs: @LABRCNTIP (procalcitonin:4,lacticidven:4)  ) Recent Results (from the past 240 hour(s))  Resp Panel by RT-PCR (Flu A&B, Covid) Nasopharyngeal Swab     Status: None   Collection Time: 09/16/21 12:36 PM   Specimen: Nasopharyngeal Swab; Nasopharyngeal(NP) swabs in vial transport medium  Result Value Ref Range Status    SARS Coronavirus 2 by RT PCR NEGATIVE NEGATIVE Final    Comment: (NOTE) SARS-CoV-2 target nucleic acids are NOT DETECTED.  The SARS-CoV-2 RNA is generally detectable in upper respiratory specimens during the acute phase of infection. The lowest concentration of SARS-CoV-2 viral copies this assay can detect is 138 copies/mL. A negative result does not preclude SARS-Cov-2 infection and should not be used as the sole basis for treatment or other patient management decisions. A negative result may occur with  improper specimen collection/handling, submission of specimen other than nasopharyngeal swab, presence of viral mutation(s) within the areas targeted by this assay, and inadequate number of viral copies(<138 copies/mL). A negative result must be combined with clinical observations, patient history, and epidemiological information. The expected result is Negative.  Fact Sheet for Patients:  EntrepreneurPulse.com.au  Fact Sheet for Healthcare Providers:  IncredibleEmployment.be  This test is no t yet approved or cleared by the [place] FDA and  has been authorized for detection and/or diagnosis of SARS-CoV-2 by FDA under an Emergency Use Authorization (EUA). This EUA will remain  in effect (meaning this test can be used) for the duration of the COVID-19 declaration under Section 564(b)(1) of the Act, 21 U.S.C.section 360bbb-3(b)(1), unless the authorization is terminated  or revoked sooner.       Influenza A by PCR NEGATIVE NEGATIVE Final   Influenza B by PCR NEGATIVE NEGATIVE Final    Comment: (NOTE) The Xpert Xpress SARS-CoV-2/FLU/RSV plus assay is intended as an aid in the diagnosis of influenza from Nasopharyngeal swab specimens and should not be used as a sole basis for treatment. Nasal washings and aspirates are unacceptable for Xpert Xpress SARS-CoV-2/FLU/RSV testing.  Fact Sheet for  Patients: EntrepreneurPulse.com.au  Fact Sheet for Healthcare Providers: IncredibleEmployment.be  This test is not yet approved or cleared by the [place] FDA and has been authorized for detection and/or diagnosis of SARS-CoV-2 by FDA under an Emergency Use Authorization (EUA). This EUA will remain in effect (meaning this test can be used) for the duration of the COVID-19 declaration under Section 564(b)(1) of the Act, 21 U.S.C. section 360bbb-3(b)(1), unless the authorization is terminated or revoked.  Performed at [facility], [place] [address], [place], [place] [address]       Studies: No results found.  Scheduled Meds:  Chlorhexidine Gluconate Cloth  6 each Topical Daily   metFORMIN  1,000 mg Oral BID WC   And  empagliflozin  25 mg Oral Q breakfast   finasteride  5 mg Oral Daily   insulin aspart  0-15 Units Subcutaneous TID WC   irbesartan  300 mg Oral Daily   oxyCODONE  5 mg Oral Once   potassium chloride SA  20 mEq Oral Daily   repaglinide  2 mg Oral TID AC    Continuous Infusions:  sodium chloride 75 mL/hr at 09/16/21 2307   sodium chloride irrigation 0 mL (09/16/21 2016)     LOS: 0 days     Cristal Deer, MD [facility]  To reach me or the doctor on call, go to: [email or web address] Password [email or web address]  09/17/2021, 9:09 AM

## 2021-09-17 NOTE — Progress Notes (Signed)
  Subjective: One episode of bladder pressure requiring irrigation of a clot overnight  Feeling much more comfortable  Urine light pink on slow CBI  Objective: Vital signs in last 24 hours: Temp:  [98.4 F (36.9 C)-98.7 F (37.1 C)] 98.7 F (37.1 C) (12/04 0549) Pulse Rate:  [68-92] 70 (12/04 0549) Resp:  [16-20] 18 (12/04 0549) BP: (143-206)/(78-111) 148/83 (12/04 0549) SpO2:  [96 %-100 %] 98 % (12/04 0549)  Intake/Output from previous day: 12/03 0701 - 12/04 0700 In: 2428.3 [I.V.:1428.3; IV Piggyback:1000] Out: 34500 [Urine:34500] Intake/Output this shift: Total I/O In: 3000 [Other:3000] Out: 2400 [Urine:2400]  Physical Exam:  General: Alert and oriented CV: RRR Lungs: Clear Abdomen: Soft, ND GU: 3 way foley catheter in place draining light pink urine  Ext: NT, No erythema  Lab Results: Recent Labs    09/16/21 0840 09/16/21 2020 09/17/21 0454  HGB 17.8* 14.8 14.6  HCT 52.9* 44.1 43.3   BMET Recent Labs    09/16/21 0840 09/17/21 0454  NA 141 138  K 3.5 3.6  CL 111 112*  CO2 20* 21*  GLUCOSE 276* 197*  BUN 26* 18  CREATININE 0.70 0.75  CALCIUM 9.7 8.3*     Studies/Results: No results found.  Assessment/Plan: 65 y/o male with hx of DM, HTN, and non-muscle invasive bladder cancer s/p TURBT on 11/18 who presented in clot retention. Has improved with CBI, light pink this morning with CBI off.   -No plans for OR today, can have diet  -CBI clamped this morning; irrigated without clot burden -Will reassess with CBI off later this morning  -If urine remains light pink will discharge today with foley in place   -If hematuria worsens, may need to restart CBI    LOS: 0 days   [name] [name] 09/17/2021, 8:19 AM

## 2021-09-18 LAB — GLUCOSE, CAPILLARY
Glucose-Capillary: 141 mg/dL — ABNORMAL HIGH (ref 70–99)
Glucose-Capillary: 186 mg/dL — ABNORMAL HIGH (ref 70–99)

## 2021-09-18 MED ORDER — POLYETHYLENE GLYCOL 3350 17 G PO PACK: 17.0000 g | PACK | Freq: Every day | ORAL | 0 refills | Status: DC | PRN

## 2021-09-18 MED ORDER — OXYCODONE HCL 5 MG PO TABS: 5.0000 mg | ORAL_TABLET | ORAL | 0 refills | Status: DC | PRN

## 2021-09-18 NOTE — Progress Notes (Signed)
Foley care/management provided. Patient verbalized understanding and demonstrated as well, stated he had foley at home in the past comfortable managing it.

## 2021-09-18 NOTE — Progress Notes (Signed)
  Subjective: Felt anxious about going home yesterday so remained in house CBI has been off for 24 hours  Gentle irrigation this morning with small amount of old clot flecks Patient feeling good this morning, would like to go home today   Objective: Vital signs in last 24 hours: Temp:  [98.4 F (36.9 C)-98.9 F (37.2 C)] 98.9 F (37.2 C) (12/05 0610) Pulse Rate:  [70-76] 70 (12/05 0610) Resp:  [15-20] 20 (12/05 0610) BP: (153-168)/(74-94) 154/91 (12/05 0610) SpO2:  [97 %] 97 % (12/05 0610)  Intake/Output from previous day: 12/04 0701 - 12/05 0700 In: 4681.3 [I.V.:1681.3] Out: 8375 [Urine:8375] Intake/Output this shift: No intake/output data recorded.  Physical Exam:  General: Alert and oriented CV: RRR Lungs: Clear Abdomen: Soft, ND GU: 3 way foley catheter in place draining light pink urine  Ext: NT, No erythema  Lab Results: Recent Labs    09/16/21 2020 09/17/21 0454 09/17/21 1651  HGB 14.8 14.6 14.5  HCT 44.1 43.3 43.3   BMET Recent Labs    09/16/21 0840 09/17/21 0454  NA 141 138  K 3.5 3.6  CL 111 112*  CO2 20* 21*  GLUCOSE 276* 197*  BUN 26* 18  CREATININE 0.70 0.75  CALCIUM 9.7 8.3*     Studies/Results: No results found.  Assessment/Plan: 65 y/o male with hx of DM, HTN, and non-muscle invasive bladder cancer s/p TURBT on 11/18 who presented in clot retention. CBI has been off x 24 hours.   -Plan for discharge with foley in place, please cap third port.   -He will return for outpatient trial of void later this week  -Urine culture from admission has resulted with no growth   LOS: 1 day   Aldine Contes 09/18/2021, 7:16 AM

## 2021-09-18 NOTE — Discharge Summary (Signed)
Physician Discharge Summary  [name] [name] GDJ:[id number] DOB: 22-Jan-1956 DOA: 09/16/2021  PCP: Cassandria Anger, MD  Admit date: 09/16/2021 Discharge date: 09/18/2021  Admitted From: Home Disposition:  Home  Recommendations for Outpatient Follow-up:  Follow up with PCP in 1-2 weeks Please obtain BMP/CBC in one week your next doctors visit.  Outpatient urology follow-up 2-3 days to be arranged by their service Maintain Foley catheter upon discharge Should follow-up outpatient cardiology for any medication adjustment  Discharge Condition: Stable CODE STATUS: Full code Diet recommendation: Diabetic  Brief/Interim Summary: 65 year old with history of CAD, cervical degenerative changes, DM2, GERD, HLD, nephrolithiasis, prostatitis, renal carcinoma, BPH recently underwent bladder tumor resection on gemcitabine on November 18/22.  Foley was removed without any issue but then developed intense pain and gross hematuria therefore came to the hospital.  Initially placed on CBI.  Over the course of 48 hours hematuria improved therefore cleared for discharge home.    Body mass index is 30.25 kg/m.      Gross hematuria improved-CBI performed by urology team.  Hemoglobin has remained stable.  Plan to discharge home with Foley catheter with outpatient follow-up to be arranged by their service in the next 2-3 days.  History of bladder tumor s/p resection on gemcitabine-followed by urology  Diabetes mellitus type 2-continue home medication  Essential hypertension-continue home meds  Hyperlipidemia-diet controlled  CAD-follow-up outpatient cardiology.   Discharge Diagnoses:  Principal Problem:   Gross hematuria Active Problems:   DM type 2, controlled, with complication ([place])   Essential hypertension   Dyslipidemia   Polycythemia, secondary   Tobacco use disorder   Coronary atherosclerosis      Consultations: Urology  Subjective: Feeling okay no  complaints  Discharge Exam: Vitals:   09/18/21 0610 09/18/21 0900  BP: (!) 154/91 (!) 145/81  Pulse: 70 77  Resp: 20   Temp: 98.9 F (37.2 C)   SpO2: 97%    Vitals:   09/17/21 1238 09/17/21 2103 09/18/21 0610 09/18/21 0900  BP: (!) 168/94 (!) 153/74 (!) 154/91 (!) 145/81  Pulse: 73 76 70 77  Resp: _0 Temp: 98.4 F (36.9 C) 98.4 F (36.9 C) 98.9 F (37.2 C)   TempSrc: Oral Oral Oral   SpO2: 97% 97% 97%   Weight:      Height:        General: Pt is alert, awake, not in acute distress Cardiovascular: RRR, S1/S2 +, no rubs, no gallops Respiratory: CTA bilaterally, no wheezing, no rhonchi Abdominal: Soft, NT, ND, bowel sounds + Extremities: no edema, no cyanosis  Discharge Instructions   Allergies as of 09/18/2021       Reactions   Tamsulosin Other (See Comments)   Unable to perform sexually   Codeine Itching, Rash   Wound Dressing Adhesive Itching, Other (See Comments)   Band-Aid adhesive        Medication List     STOP taking these medications    HYDROcodone-acetaminophen 5-325 MG tablet Commonly known as: NORCO/VICODIN       TAKE these medications    acetaminophen 325 MG tablet Commonly known as: TYLENOL Take 650 mg by mouth every 6 (six) hours as needed for mild pain.   amLODipine 5 MG tablet Commonly known as: NORVASC TAKE 1 TABLET BY MOUTH EVERY DAY   blood glucose meter kit and supplies Kit Use to test blood sugar once a day. DX: E11.9   cholecalciferol 1000 units tablet Commonly known as: VITAMIN D Take 1,000 Units by  mouth daily.   Contour Next Monitor w/Device Kit 1 Device by Does not apply route daily.   Contour Next Test test strip Generic drug: glucose blood USE AS DIRECTED   finasteride 5 MG tablet Commonly known as: PROSCAR TAKE 1 TABLET BY MOUTH EVERY DAY   Lancets Misc Use to check blood sugar once per day.   olmesartan 40 MG tablet Commonly known as: BENICAR Take 1 tablet (40 mg total) by mouth daily.    oxyCODONE 5 MG immediate release tablet Commonly known as: Oxy IR/ROXICODONE Take 1 tablet (5 mg total) by mouth every 4 (four) hours as needed for breakthrough pain or severe pain.   polyethylene glycol 17 g packet Commonly known as: MIRALAX / GLYCOLAX Take 17 g by mouth daily as needed for moderate constipation or severe constipation.   potassium chloride SA 20 MEQ tablet Commonly known as: KLOR-CON M Take 1 tablet (20 mEq total) by mouth daily.   repaglinide 2 MG tablet Commonly known as: PRANDIN TAKE 1 TABLET BY MOUTH 3 TIMES A DAY BEFORE MEALS   Synjardy XR 12.02-999 MG Tb24 Generic drug: Empagliflozin-metFORMIN HCl ER Take 1 tablet by mouth 2 (two) times daily.   tadalafil 5 MG tablet Commonly known as: CIALIS Take 5 mg by mouth daily.   vitamin C 1000 MG tablet Take 1,000 mg by mouth daily as needed (immunine).        Follow-up Information     [facility] Follow up.   Why: The office will call to arrange f/u in then next 2-3 days for catheter removal. Contact information: [place] [phone]               Allergies  Allergen Reactions   Tamsulosin Other (See Comments)    Unable to perform sexually   Codeine Itching and Rash   Wound Dressing Adhesive Itching and Other (See Comments)    Band-Aid adhesive    You were cared for by a hospitalist during your hospital stay. If you have any questions about your discharge medications or the care you received while you were in the hospital after you are discharged, you can call the unit and asked to speak with the hospitalist on call if the hospitalist that took care of you is not available. Once you are discharged, your primary care physician will handle any further medical issues. Please note that no refills for any discharge medications will be authorized once you are discharged, as it is imperative that you return to your primary care physician  (or establish a relationship with a primary care physician if you do not have one) for your aftercare needs so that they can reassess your need for medications and monitor your lab values.   Procedures/Studies: No results found.   The results of significant diagnostics from this hospitalization (including imaging, microbiology, ancillary and laboratory) are listed below for reference.     Microbiology: Recent Results (from the past 240 hour(s))  Urine Culture     Status: Abnormal   Collection Time: 09/16/21  8:01 AM   Specimen: Urine, Catheterized  Result Value Ref Range Status   Specimen Description   Final    URINE, CATHETERIZED Performed at [facility] [address] [place], [place] [address]    Special Requests   Final    NONE Performed at [facility], [place] [address], [place], [place] [address]    Culture (A)  Final    <  10,000 COLONIES/mL INSIGNIFICANT GROWTH Performed at [facility], [place] [address], [place], [place] [address]    Report Status 09/17/2021 FINAL  Final  Resp Panel by RT-PCR (Flu A&B, Covid) Nasopharyngeal Swab     Status: None   Collection Time: 09/16/21 12:36 PM   Specimen: Nasopharyngeal Swab; Nasopharyngeal(NP) swabs in vial transport medium  Result Value Ref Range Status   SARS Coronavirus 2 by RT PCR NEGATIVE NEGATIVE Final    Comment: (NOTE) SARS-CoV-2 target nucleic acids are NOT DETECTED.  The SARS-CoV-2 RNA is generally detectable in upper respiratory specimens during the acute phase of infection. The lowest concentration of SARS-CoV-2 viral copies this assay can detect is 138 copies/mL. A negative result does not preclude SARS-Cov-2 infection and should not be used as the sole basis for treatment or other patient management decisions. A negative result may occur with  improper specimen collection/handling, submission of specimen other than nasopharyngeal swab, presence of viral mutation(s)  within the areas targeted by this assay, and inadequate number of viral copies(<138 copies/mL). A negative result must be combined with clinical observations, patient history, and epidemiological information. The expected result is Negative.  Fact Sheet for Patients:  EntrepreneurPulse.com.au  Fact Sheet for Healthcare Providers:  IncredibleEmployment.be  This test is no t yet approved or cleared by the [place] FDA and  has been authorized for detection and/or diagnosis of SARS-CoV-2 by FDA under an Emergency Use Authorization (EUA). This EUA will remain  in effect (meaning this test can be used) for the duration of the COVID-19 declaration under Section 564(b)(1) of the Act, 21 U.S.C.section 360bbb-3(b)(1), unless the authorization is terminated  or revoked sooner.       Influenza A by PCR NEGATIVE NEGATIVE Final   Influenza B by PCR NEGATIVE NEGATIVE Final    Comment: (NOTE) The Xpert Xpress SARS-CoV-2/FLU/RSV plus assay is intended as an aid in the diagnosis of influenza from Nasopharyngeal swab specimens and should not be used as a sole basis for treatment. Nasal washings and aspirates are unacceptable for Xpert Xpress SARS-CoV-2/FLU/RSV testing.  Fact Sheet for Patients: EntrepreneurPulse.com.au  Fact Sheet for Healthcare Providers: IncredibleEmployment.be  This test is not yet approved or cleared by the [place] FDA and has been authorized for detection and/or diagnosis of SARS-CoV-2 by FDA under an Emergency Use Authorization (EUA). This EUA will remain in effect (meaning this test can be used) for the duration of the COVID-19 declaration under Section 564(b)(1) of the Act, 21 U.S.C. section 360bbb-3(b)(1), unless the authorization is terminated or revoked.  Performed at [facility], [place] [address] [place], [place] [address]      Labs: BNP (last 3  results) No results for input(s): BNP in the last 8760 hours. Basic Metabolic Panel: Recent Labs  Lab 09/16/21 0840 09/17/21 0454  NA 141 138  K 3.5 3.6  CL 111 112*  CO2 20* 21*  GLUCOSE 276* 197*  BUN 26* 18  CREATININE 0.70 0.75  CALCIUM 9.7 8.3*  MG  --  2.2   Liver Function Tests: Recent Labs  Lab 09/16/21 0840  AST 14*  ALT 30  ALKPHOS 116  BILITOT 1.1  PROT 7.5  ALBUMIN 4.5   No results for input(s): LIPASE, AMYLASE in the last 168 hours. No results for input(s): AMMONIA in the last 168 hours. CBC: Recent Labs  Lab 09/16/21 0840 09/16/21 2020 09/17/21 0454 09/17/21 1651  WBC 14.8*  --  11.8* 10.0  NEUTROABS  --   --   --  6.7  HGB 17.8* 14.8 14.6 14.5  HCT 52.9* 44.1 43.3 43.3  MCV 87.7  --  87.7 88.5  PLT 176  --  154 158   Cardiac Enzymes: No results for input(s): CKTOTAL, CKMB, CKMBINDEX, TROPONINI in the last 168 hours. BNP: Invalid input(s): POCBNP CBG: Recent Labs  Lab 09/17/21 1613 09/17/21 1838 09/17/21 2100 09/18/21 0721 09/18/21 1106  GLUCAP 179* 118* 119* 141* 186*   D-Dimer No results for input(s): DDIMER in the last 72 hours. Hgb A1c Recent Labs    09/17/21 0454  HGBA1C 7.5*   Lipid Profile No results for input(s): CHOL, HDL, LDLCALC, TRIG, CHOLHDL, LDLDIRECT in the last 72 hours. Thyroid function studies No results for input(s): TSH, T4TOTAL, T3FREE, THYROIDAB in the last 72 hours.  Invalid input(s): FREET3 Anemia work up No results for input(s): VITAMINB12, FOLATE, FERRITIN, TIBC, IRON, RETICCTPCT in the last 72 hours. Urinalysis    Component Value Date/Time   COLORURINE RED (A) 09/16/2021 0801   APPEARANCEUR BLOODY (A) 09/16/2021 0801   LABSPEC  09/16/2021 0801    TEST NOT REPORTED DUE TO COLOR INTERFERENCE OF URINE PIGMENT   PHURINE  09/16/2021 0801    TEST NOT REPORTED DUE TO COLOR INTERFERENCE OF URINE PIGMENT   GLUCOSEU (A) 09/16/2021 0801    TEST NOT REPORTED DUE TO COLOR INTERFERENCE OF URINE PIGMENT    GLUCOSEU >=1000 (A) 03/06/2021 0834   HGBUR (A) 09/16/2021 0801    TEST NOT REPORTED DUE TO COLOR INTERFERENCE OF URINE PIGMENT   BILIRUBINUR (A) 09/16/2021 0801    TEST NOT REPORTED DUE TO COLOR INTERFERENCE OF URINE PIGMENT   KETONESUR (A) 09/16/2021 0801    TEST NOT REPORTED DUE TO COLOR INTERFERENCE OF URINE PIGMENT   PROTEINUR (A) 09/16/2021 0801    TEST NOT REPORTED DUE TO COLOR INTERFERENCE OF URINE PIGMENT   UROBILINOGEN 0.2 03/06/2021 0834   NITRITE (A) 09/16/2021 0801    TEST NOT REPORTED DUE TO COLOR INTERFERENCE OF URINE PIGMENT   LEUKOCYTESUR (A) 09/16/2021 0801    TEST NOT REPORTED DUE TO COLOR INTERFERENCE OF URINE PIGMENT   Sepsis Labs Invalid input(s): PROCALCITONIN,  WBC,  LACTICIDVEN Microbiology Recent Results (from the past 240 hour(s))  Urine Culture     Status: Abnormal   Collection Time: 09/16/21  8:01 AM   Specimen: Urine, Catheterized  Result Value Ref Range Status   Specimen Description   Final    URINE, CATHETERIZED Performed at [facility], [place] [address], [place], [place] [address]    Special Requests   Final    NONE Performed at [facility], [place] [address] [place], [place] [address]    Culture (A)  Final    <10,000 COLONIES/mL INSIGNIFICANT GROWTH Performed at [place] [address] [place], [place] [address]    Report Status 09/17/2021 FINAL  Final  Resp Panel by RT-PCR (Flu A&B, Covid) Nasopharyngeal Swab     Status: None   Collection Time: 09/16/21 12:36 PM   Specimen: Nasopharyngeal Swab; Nasopharyngeal(NP) swabs in vial transport medium  Result Value Ref Range Status   SARS Coronavirus 2 by RT PCR NEGATIVE NEGATIVE Final    Comment: (NOTE) SARS-CoV-2 target nucleic acids are NOT DETECTED.  The SARS-CoV-2 RNA is generally detectable in upper respiratory specimens during the acute phase of infection. The lowest concentration of SARS-CoV-2 viral copies this assay can detect is 138  copies/mL. A negative result does not preclude SARS-Cov-2 infection and should not be  used as the sole basis for treatment or other patient management decisions. A negative result may occur with  improper specimen collection/handling, submission of specimen other than nasopharyngeal swab, presence of viral mutation(s) within the areas targeted by this assay, and inadequate number of viral copies(<138 copies/mL). A negative result must be combined with clinical observations, patient history, and epidemiological information. The expected result is Negative.  Fact Sheet for Patients:  EntrepreneurPulse.com.au  Fact Sheet for Healthcare Providers:  IncredibleEmployment.be  This test is no t yet approved or cleared by the [place] FDA and  has been authorized for detection and/or diagnosis of SARS-CoV-2 by FDA under an Emergency Use Authorization (EUA). This EUA will remain  in effect (meaning this test can be used) for the duration of the COVID-19 declaration under Section 564(b)(1) of the Act, 21 U.S.C.section 360bbb-3(b)(1), unless the authorization is terminated  or revoked sooner.       Influenza A by PCR NEGATIVE NEGATIVE Final   Influenza B by PCR NEGATIVE NEGATIVE Final    Comment: (NOTE) The Xpert Xpress SARS-CoV-2/FLU/RSV plus assay is intended as an aid in the diagnosis of influenza from Nasopharyngeal swab specimens and should not be used as a sole basis for treatment. Nasal washings and aspirates are unacceptable for Xpert Xpress SARS-CoV-2/FLU/RSV testing.  Fact Sheet for Patients: EntrepreneurPulse.com.au  Fact Sheet for Healthcare Providers: IncredibleEmployment.be  This test is not yet approved or cleared by the [place] FDA and has been authorized for detection and/or diagnosis of SARS-CoV-2 by FDA under an Emergency Use Authorization (EUA). This EUA will remain in effect (meaning  this test can be used) for the duration of the COVID-19 declaration under Section 564(b)(1) of the Act, 21 U.S.C. section 360bbb-3(b)(1), unless the authorization is terminated or revoked.  Performed at [facility], [place] [address], [place], [place] [address]      Time coordinating discharge:  I have spent 35 minutes face to face with the patient and on the ward discussing the patients care, assessment, plan and disposition with other care givers. >50% of the time was devoted counseling the patient about the risks and benefits of treatment/Discharge disposition and coordinating care.   SIGNED:   Damita Lack, MD  [facility] 09/18/2021, 1:21 PM   If 7PM-7AM, please contact night-coverage

## 2021-09-18 NOTE — Progress Notes (Signed)
Patient discharged home with wife. Discharge instructions given and explained to patient, he verbalized understanding, patient denies any pain/distress. No pressure injury noted. Accompanied home by wife, transported to the care by staff.

## 2021-09-21 DIAGNOSIS — C673 Malignant neoplasm of anterior wall of bladder: Secondary | ICD-10-CM | POA: Diagnosis not present

## 2021-09-22 NOTE — Progress Notes (Signed)
Gross hematuria is a complication of the surgery and tumor

## 2021-09-26 ENCOUNTER — Ambulatory Visit: Payer: BC Managed Care – PPO | Admitting: Internal Medicine

## 2021-10-04 ENCOUNTER — Telehealth: Payer: Self-pay | Admitting: *Deleted

## 2021-10-04 NOTE — Telephone Encounter (Signed)
Transition Care Management Unsuccessful Follow-up Telephone Call  Date of discharge and from where:  [facility]   09/18/2021  Attempts:  1st Attempt  Reason for unsuccessful TCM follow-up call:  No answer/busy  Jacqlyn Larsen [facility], BSN RN Case Manager [phone]

## 2021-10-06 DIAGNOSIS — Z5111 Encounter for antineoplastic chemotherapy: Secondary | ICD-10-CM | POA: Diagnosis not present

## 2021-10-06 DIAGNOSIS — C671 Malignant neoplasm of dome of bladder: Secondary | ICD-10-CM | POA: Diagnosis not present

## 2021-10-11 ENCOUNTER — Telehealth: Payer: Self-pay

## 2021-10-11 NOTE — Telephone Encounter (Signed)
Transition Care Management Unsuccessful Follow-up Telephone Call  Date of discharge and from where:  09/18/21 from [facility]  Attempts:  2nd Attempt  Reason for unsuccessful TCM follow-up call:  Left voice message   Thea Silversmith, RN, MSN, BSN, [place] Care Management Coordinator [phone]

## 2021-10-13 DIAGNOSIS — Z5111 Encounter for antineoplastic chemotherapy: Secondary | ICD-10-CM | POA: Diagnosis not present

## 2021-10-13 DIAGNOSIS — C673 Malignant neoplasm of anterior wall of bladder: Secondary | ICD-10-CM | POA: Diagnosis not present

## 2021-10-18 ENCOUNTER — Ambulatory Visit: Payer: BC Managed Care – PPO | Admitting: Internal Medicine

## 2021-10-20 DIAGNOSIS — C673 Malignant neoplasm of anterior wall of bladder: Secondary | ICD-10-CM | POA: Diagnosis not present

## 2021-10-20 DIAGNOSIS — Z5111 Encounter for antineoplastic chemotherapy: Secondary | ICD-10-CM | POA: Diagnosis not present

## 2021-11-03 ENCOUNTER — Other Ambulatory Visit (INDEPENDENT_AMBULATORY_CARE_PROVIDER_SITE_OTHER): Payer: Medicare Other

## 2021-11-03 ENCOUNTER — Other Ambulatory Visit: Payer: Self-pay

## 2021-11-03 DIAGNOSIS — E118 Type 2 diabetes mellitus with unspecified complications: Secondary | ICD-10-CM | POA: Diagnosis not present

## 2021-11-03 DIAGNOSIS — I2583 Coronary atherosclerosis due to lipid rich plaque: Secondary | ICD-10-CM

## 2021-11-03 DIAGNOSIS — I2510 Atherosclerotic heart disease of native coronary artery without angina pectoris: Secondary | ICD-10-CM | POA: Diagnosis not present

## 2021-11-03 LAB — CBC WITH DIFFERENTIAL/PLATELET
Basophils Absolute: 0.1 10*3/uL (ref 0.0–0.1)
Basophils Relative: 1.1 % (ref 0.0–3.0)
Eosinophils Absolute: 0.2 10*3/uL (ref 0.0–0.7)
Eosinophils Relative: 3.9 % (ref 0.0–5.0)
HCT: 51.1 % (ref 39.0–52.0)
Hemoglobin: 16.9 g/dL (ref 13.0–17.0)
Lymphocytes Relative: 18.3 % (ref 12.0–46.0)
Lymphs Abs: 1 10*3/uL (ref 0.7–4.0)
MCHC: 33 g/dL (ref 30.0–36.0)
MCV: 86.9 fl (ref 78.0–100.0)
Monocytes Absolute: 0.9 10*3/uL (ref 0.1–1.0)
Monocytes Relative: 16 % — ABNORMAL HIGH (ref 3.0–12.0)
Neutro Abs: 3.4 10*3/uL (ref 1.4–7.7)
Neutrophils Relative %: 60.7 % (ref 43.0–77.0)
Platelets: 142 10*3/uL — ABNORMAL LOW (ref 150.0–400.0)
RBC: 5.88 Mil/uL — ABNORMAL HIGH (ref 4.22–5.81)
RDW: 14.9 % (ref 11.5–15.5)
WBC: 5.6 10*3/uL (ref 4.0–10.5)

## 2021-11-03 LAB — COMPREHENSIVE METABOLIC PANEL
ALT: 27 U/L (ref 0–53)
AST: 17 U/L (ref 0–37)
Albumin: 4.4 g/dL (ref 3.5–5.2)
Alkaline Phosphatase: 101 U/L (ref 39–117)
BUN: 14 mg/dL (ref 6–23)
CO2: 27 mEq/L (ref 19–32)
Calcium: 9.2 mg/dL (ref 8.4–10.5)
Chloride: 105 mEq/L (ref 96–112)
Creatinine, Ser: 0.8 mg/dL (ref 0.40–1.50)
GFR: 91.72 mL/min (ref >60.00)
Glucose, Bld: 147 mg/dL — ABNORMAL HIGH (ref 70–99)
Potassium: 3.4 mEq/L — ABNORMAL LOW (ref 3.5–5.1)
Sodium: 139 mEq/L (ref 135–145)
Total Bilirubin: 0.5 mg/dL (ref 0.2–1.2)
Total Protein: 7.2 g/dL (ref 6.0–8.3)

## 2021-11-03 LAB — HEMOGLOBIN A1C: Hgb A1c MFr Bld: 7.2 % — ABNORMAL HIGH (ref 4.6–6.5)

## 2021-11-06 ENCOUNTER — Ambulatory Visit (INDEPENDENT_AMBULATORY_CARE_PROVIDER_SITE_OTHER): Payer: Medicare Other | Admitting: Internal Medicine

## 2021-11-06 ENCOUNTER — Other Ambulatory Visit: Payer: Self-pay

## 2021-11-06 ENCOUNTER — Encounter: Payer: Self-pay | Admitting: Internal Medicine

## 2021-11-06 DIAGNOSIS — R310 Gross hematuria: Secondary | ICD-10-CM

## 2021-11-06 DIAGNOSIS — I10 Essential (primary) hypertension: Secondary | ICD-10-CM

## 2021-11-06 DIAGNOSIS — E118 Type 2 diabetes mellitus with unspecified complications: Secondary | ICD-10-CM

## 2021-11-06 DIAGNOSIS — M5442 Lumbago with sciatica, left side: Secondary | ICD-10-CM | POA: Diagnosis not present

## 2021-11-06 DIAGNOSIS — E876 Hypokalemia: Secondary | ICD-10-CM

## 2021-11-06 DIAGNOSIS — G8929 Other chronic pain: Secondary | ICD-10-CM

## 2021-11-06 NOTE — Assessment & Plan Note (Signed)
Chronic. 

## 2021-11-06 NOTE — Assessment & Plan Note (Signed)
Cont w/Synjardy, Prandin

## 2021-11-06 NOTE — Progress Notes (Signed)
Subjective:  Patient ID: [name] [name], male    DOB: [birth year]  Age: 66 y.o. MRN: [id number]  CC: Follow-up (3 month f/u)   HPI [name] [name] presents for HTN, bladder cancer, DM, HTN  Outpatient Medications Prior to Visit  Medication Sig Dispense Refill   acetaminophen (TYLENOL) 325 MG tablet Take 650 mg by mouth every 6 (six) hours as needed for mild pain.     amLODipine (NORVASC) 5 MG tablet TAKE 1 TABLET BY MOUTH EVERY DAY (Patient taking differently: Take 5 mg by mouth daily.) 90 tablet 3   Ascorbic Acid (VITAMIN C) 1000 MG tablet Take 1,000 mg by mouth daily as needed (immunine).     blood glucose meter kit and supplies KIT Use to test blood sugar once a day. DX: E11.9 1 each 0   Blood Glucose Monitoring Suppl (CONTOUR NEXT MONITOR) w/Device KIT 1 Device by Does not apply route daily. 1 kit 0   cholecalciferol (VITAMIN D) 1000 UNITS tablet Take 1,000 Units by mouth daily.     CONTOUR NEXT TEST test strip USE AS DIRECTED 100 strip 5   Empagliflozin-metFORMIN HCl ER (SYNJARDY XR) 12.02-999 MG TB24 Take 1 tablet by mouth 2 (two) times daily. 180 tablet 3   finasteride (PROSCAR) 5 MG tablet TAKE 1 TABLET BY MOUTH EVERY DAY (Patient taking differently: Take 5 mg by mouth daily.) 90 tablet 3   Lancets MISC Use to check blood sugar once per day. 100 each 3   olmesartan (BENICAR) 40 MG tablet Take 1 tablet (40 mg total) by mouth daily. (Patient taking differently: Take 40 mg by mouth daily.) 90 tablet 3   polyethylene glycol (MIRALAX / GLYCOLAX) 17 g packet Take 17 g by mouth daily as needed for moderate constipation or severe constipation. 14 each 0   potassium chloride SA (KLOR-CON) 20 MEQ tablet Take 1 tablet (20 mEq total) by mouth daily. 90 tablet 11   repaglinide (PRANDIN) 2 MG tablet TAKE 1 TABLET BY MOUTH 3 TIMES A DAY BEFORE MEALS 270 tablet 3   tadalafil (CIALIS) 5 MG tablet Take 5 mg by mouth daily.     oxyCODONE (OXY IR/ROXICODONE) 5 MG immediate release tablet Take 1  tablet (5 mg total) by mouth every 4 (four) hours as needed for breakthrough pain or severe pain. (Patient not taking: Reported on 11/06/2021) 15 tablet 0   No facility-administered medications prior to visit.    ROS: Review of Systems  Constitutional:  Negative for appetite change, fatigue and unexpected weight change.  HENT:  Negative for congestion, nosebleeds, sneezing, sore throat and trouble swallowing.   Eyes:  Negative for itching and visual disturbance.  Respiratory:  Negative for cough.   Cardiovascular:  Negative for chest pain, palpitations and leg swelling.  Gastrointestinal:  Negative for abdominal distention, blood in stool, diarrhea and nausea.  Genitourinary:  Positive for hematuria (after BCG treatments). Negative for dysuria and frequency.  Musculoskeletal:  Positive for arthralgias and back pain. Negative for gait problem, joint swelling and neck pain.  Skin:  Negative for rash.  Neurological:  Negative for dizziness, tremors, speech difficulty and weakness.  Psychiatric/Behavioral:  Negative for agitation, dysphoric mood and sleep disturbance. The patient is not nervous/anxious.    Objective:  BP 140/82 (BP Location: Left Arm)    Pulse 69    Temp 98.4 F (36.9 C) (Oral)    Ht _0  (1.676 m)    Wt 190 lb 12.8 oz (86.5 kg)    SpO2  96%    BMI 30.80 kg/m   BP Readings from Last 3 Encounters:  11/06/21 140/82  09/18/21 (!) 145/81  09/01/21 (!) 153/87    Wt Readings from Last 3 Encounters:  11/06/21 190 lb 12.8 oz (86.5 kg)  09/16/21 187 lb 6.4 oz (85 kg)  09/01/21 187 lb 6.4 oz (85 kg)    Physical Exam Constitutional:      General: He is not in acute distress.    Appearance: He is well-developed.     Comments: NAD  Eyes:     Conjunctiva/sclera: Conjunctivae normal.     Pupils: Pupils are equal, round, and reactive to light.  Neck:     Thyroid: No thyromegaly.     Vascular: No JVD.  Cardiovascular:     Rate and Rhythm: Normal rate and regular rhythm.      Heart sounds: Normal heart sounds. No murmur heard.   No friction rub. No gallop.  Pulmonary:     Effort: Pulmonary effort is normal. No respiratory distress.     Breath sounds: Normal breath sounds. No wheezing or rales.  Chest:     Chest wall: No tenderness.  Abdominal:     General: Bowel sounds are normal. There is no distension.     Palpations: Abdomen is soft. There is no mass.     Tenderness: There is no abdominal tenderness. There is no guarding or rebound.  Musculoskeletal:        General: No tenderness. Normal range of motion.     Cervical back: Normal range of motion.  Lymphadenopathy:     Cervical: No cervical adenopathy.  Skin:    General: Skin is warm and dry.     Findings: No rash.  Neurological:     Mental Status: He is alert and oriented to person, place, and time.     Cranial Nerves: No cranial nerve deficit.     Motor: No abnormal muscle tone.     Coordination: Coordination normal.     Gait: Gait normal.     Deep Tendon Reflexes: Reflexes are normal and symmetric.  Psychiatric:        Behavior: Behavior normal.        Thought Content: Thought content normal.        Judgment: Judgment normal.    Lab Results  Component Value Date   WBC 5.6 11/03/2021   HGB 16.9 11/03/2021   HCT 51.1 11/03/2021   PLT 142.0 (L) 11/03/2021   GLUCOSE 147 (H) 11/03/2021   CHOL 153 03/06/2021   TRIG 170.0 (H) 03/06/2021   HDL 33.50 (L) 03/06/2021   LDLCALC 86 03/06/2021   ALT 27 11/03/2021   AST 17 11/03/2021   NA 139 11/03/2021   K 3.4 (L) 11/03/2021   CL 105 11/03/2021   CREATININE 0.80 11/03/2021   BUN 14 11/03/2021   CO2 27 11/03/2021   TSH 0.64 03/06/2021   PSA 3.87 03/06/2021   INR 1.1 (H) 01/08/2012   HGBA1C 7.2 (H) 11/03/2021   MICROALBUR 1.3 03/06/2021    No results found.  Assessment & Plan:   Problem List Items Addressed This Visit     DM type 2, controlled, with complication ([place])     Cont w/Synjardy, Prandin      Relevant Orders   Hemoglobin  A1c   Comprehensive metabolic panel   Essential hypertension    Cont w/Olmesartan; Norvasc      Relevant Orders   Comprehensive metabolic panel   Gross hematuria  Due to BCG treatment      Hypokalemia    Risks associated with low K treatment noncompliance were discussed. Compliance was encouraged. - Take KCl qd  Check BMET      LOW BACK PAIN    Chronic         No orders of the defined types were placed in this encounter.     Follow-up: No follow-ups on file.  Walker Kehr, MD

## 2021-11-06 NOTE — Assessment & Plan Note (Signed)
Due to BCG treatment

## 2021-11-06 NOTE — Assessment & Plan Note (Signed)
Cont w/Olmesartan; Norvasc

## 2021-11-06 NOTE — Assessment & Plan Note (Addendum)
Risks associated with low K treatment noncompliance were discussed. Compliance was encouraged. - Take KCl qd  Check BMET

## 2021-12-22 DIAGNOSIS — R3915 Urgency of urination: Secondary | ICD-10-CM | POA: Diagnosis not present

## 2021-12-22 DIAGNOSIS — Z8551 Personal history of malignant neoplasm of bladder: Secondary | ICD-10-CM | POA: Diagnosis not present

## 2021-12-26 ENCOUNTER — Ambulatory Visit (INDEPENDENT_AMBULATORY_CARE_PROVIDER_SITE_OTHER): Payer: Medicare Other

## 2021-12-26 ENCOUNTER — Ambulatory Visit (INDEPENDENT_AMBULATORY_CARE_PROVIDER_SITE_OTHER): Payer: Medicare Other | Admitting: Internal Medicine

## 2021-12-26 ENCOUNTER — Other Ambulatory Visit: Payer: Self-pay

## 2021-12-26 ENCOUNTER — Encounter: Payer: Self-pay | Admitting: Internal Medicine

## 2021-12-26 DIAGNOSIS — B351 Tinea unguium: Secondary | ICD-10-CM | POA: Diagnosis not present

## 2021-12-26 DIAGNOSIS — M79604 Pain in right leg: Secondary | ICD-10-CM | POA: Diagnosis not present

## 2021-12-26 DIAGNOSIS — M545 Low back pain, unspecified: Secondary | ICD-10-CM | POA: Diagnosis not present

## 2021-12-26 DIAGNOSIS — C641 Malignant neoplasm of right kidney, except renal pelvis: Secondary | ICD-10-CM | POA: Diagnosis not present

## 2021-12-26 DIAGNOSIS — E118 Type 2 diabetes mellitus with unspecified complications: Secondary | ICD-10-CM | POA: Diagnosis not present

## 2021-12-26 MED ORDER — METHYLPREDNISOLONE 4 MG PO TBPK: ORAL_TABLET | ORAL | 0 refills | Status: DC

## 2021-12-26 NOTE — Progress Notes (Signed)
? ?Subjective:  ?Patient ID: [name] [name], male    DOB: February 01, 1956  Age: 66 y.o. MRN: [id number] ? ?CC: Leg Pain (Rt leg pain x 2 weeks more pain in thigh area) ? ? ?HPI ?[name] [name] presents for RLE pain in the thigh x 2 weeks. It feels like the leg is dragging, weak. No LBP. C/o toenails deformities ? ?Outpatient Medications Prior to Visit  ?Medication Sig Dispense Refill  ? acetaminophen (TYLENOL) 325 MG tablet Take 650 mg by mouth every 6 (six) hours as needed for mild pain.    ? amLODipine (NORVASC) 5 MG tablet TAKE 1 TABLET BY MOUTH EVERY DAY (Patient taking differently: Take 5 mg by mouth daily.) 90 tablet 3  ? Ascorbic Acid (VITAMIN C) 1000 MG tablet Take 1,000 mg by mouth daily as needed (immunine).    ? blood glucose meter kit and supplies KIT Use to test blood sugar once a day. DX: E11.9 1 each 0  ? Blood Glucose Monitoring Suppl (CONTOUR NEXT MONITOR) w/Device KIT 1 Device by Does not apply route daily. 1 kit 0  ? cholecalciferol (VITAMIN D) 1000 UNITS tablet Take 1,000 Units by mouth daily.    ? CONTOUR NEXT TEST test strip USE AS DIRECTED 100 strip 5  ? Empagliflozin-metFORMIN HCl ER (SYNJARDY XR) 12.02-999 MG TB24 Take 1 tablet by mouth 2 (two) times daily. 180 tablet 3  ? finasteride (PROSCAR) 5 MG tablet TAKE 1 TABLET BY MOUTH EVERY DAY (Patient taking differently: Take 5 mg by mouth daily.) 90 tablet 3  ? Lancets MISC Use to check blood sugar once per day. 100 each 3  ? olmesartan (BENICAR) 40 MG tablet Take 1 tablet (40 mg total) by mouth daily. (Patient taking differently: Take 40 mg by mouth daily.) 90 tablet 3  ? polyethylene glycol (MIRALAX / GLYCOLAX) 17 g packet Take 17 g by mouth daily as needed for moderate constipation or severe constipation. 14 each 0  ? potassium chloride SA (KLOR-CON) 20 MEQ tablet Take 1 tablet (20 mEq total) by mouth daily. 90 tablet 11  ? repaglinide (PRANDIN) 2 MG tablet TAKE 1 TABLET BY MOUTH 3 TIMES A DAY BEFORE MEALS 270 tablet 3  ? tadalafil (CIALIS) 5  MG tablet Take 5 mg by mouth daily.    ? ?No facility-administered medications prior to visit.  ? ? ?ROS: ?Review of Systems  ?Constitutional:  Negative for appetite change, fatigue and unexpected weight change.  ?HENT:  Negative for congestion, nosebleeds, sneezing, sore throat and trouble swallowing.   ?Eyes:  Negative for itching and visual disturbance.  ?Respiratory:  Negative for cough.   ?Cardiovascular:  Negative for chest pain, palpitations and leg swelling.  ?Gastrointestinal:  Negative for abdominal distention, blood in stool, diarrhea and nausea.  ?Genitourinary:  Negative for frequency and hematuria.  ?Musculoskeletal:  Positive for arthralgias and gait problem. Negative for back pain, joint swelling and neck pain.  ?Skin:  Negative for rash.  ?Neurological:  Positive for weakness. Negative for dizziness, tremors and speech difficulty.  ?Psychiatric/Behavioral:  Negative for agitation, dysphoric mood and sleep disturbance. The patient is not nervous/anxious.   ? ?Objective:  ?BP (!) 160/98   Pulse 80   Temp 98.3 ?F (36.8 ?C) (Oral)   Ht _0  (1.676 m)   Wt 187 lb (84.8 kg)   SpO2 94%   BMI 30.18 kg/m?  ? ?BP Readings from Last 3 Encounters:  ?12/26/21 (!) 160/98  ?11/06/21 140/82  ?09/18/21 (!) 145/81  ? ? ?  Wt Readings from Last 3 Encounters:  ?12/26/21 187 lb (84.8 kg)  ?11/06/21 190 lb 12.8 oz (86.5 kg)  ?09/16/21 187 lb 6.4 oz (85 kg)  ? ? ?Physical Exam ?Constitutional:   ?   General: He is not in acute distress. ?   Appearance: He is well-developed.  ?   Comments: NAD  ?Eyes:  ?   Conjunctiva/sclera: Conjunctivae normal.  ?   Pupils: Pupils are equal, round, and reactive to light.  ?Neck:  ?   Thyroid: No thyromegaly.  ?   Vascular: No JVD.  ?Cardiovascular:  ?   Rate and Rhythm: Normal rate and regular rhythm.  ?   Heart sounds: Normal heart sounds. No murmur heard. ?  No friction rub. No gallop.  ?Pulmonary:  ?   Effort: Pulmonary effort is normal. No respiratory distress.  ?   Breath  sounds: Normal breath sounds. No wheezing or rales.  ?Chest:  ?   Chest wall: No tenderness.  ?Abdominal:  ?   General: Bowel sounds are normal. There is no distension.  ?   Palpations: Abdomen is soft. There is no mass.  ?   Tenderness: There is no abdominal tenderness. There is no guarding or rebound.  ?Musculoskeletal:     ?   General: No tenderness. Normal range of motion.  ?   Cervical back: Normal range of motion.  ?Lymphadenopathy:  ?   Cervical: No cervical adenopathy.  ?Skin: ?   General: Skin is warm and dry.  ?   Findings: No rash.  ?Neurological:  ?   Mental Status: He is alert and oriented to person, place, and time.  ?   Cranial Nerves: No cranial nerve deficit.  ?   Motor: No abnormal muscle tone.  ?   Coordination: Coordination normal.  ?   Gait: Gait normal.  ?   Deep Tendon Reflexes: Reflexes are normal and symmetric.  ?Psychiatric:     ?   Behavior: Behavior normal.     ?   Thought Content: Thought content normal.     ?   Judgment: Judgment normal.  ?Strait leg elevation (-) B ?Deformed toenails ? ?Lab Results  ?Component Value Date  ? WBC 5.6 11/03/2021  ? HGB 16.9 11/03/2021  ? HCT 51.1 11/03/2021  ? PLT 142.0 (L) 11/03/2021  ? GLUCOSE 147 (H) 11/03/2021  ? CHOL 153 03/06/2021  ? TRIG 170.0 (H) 03/06/2021  ? HDL 33.50 (L) 03/06/2021  ? Victor 86 03/06/2021  ? ALT 27 11/03/2021  ? AST 17 11/03/2021  ? NA 139 11/03/2021  ? K 3.4 (L) 11/03/2021  ? CL 105 11/03/2021  ? CREATININE 0.80 11/03/2021  ? BUN 14 11/03/2021  ? CO2 27 11/03/2021  ? TSH 0.64 03/06/2021  ? PSA 3.87 03/06/2021  ? INR 1.1 (H) 01/08/2012  ? HGBA1C 7.2 (H) 11/03/2021  ? MICROALBUR 1.3 03/06/2021  ? ? ?No results found. ? ?Assessment & Plan:  ? ?Problem List Items Addressed This Visit   ? ? DM type 2, controlled, with complication ([place])  ?  We discussed steroid use and sugar level increase ?  ?  ? Leg pain, anterior, right  ?  Probable radiculopathy ?Medrol pack ?Move more, ROM exerciseLS spine X ray ?  ?  ? Relevant Orders  ? DG  Lumbar Spine 2-3 Views  ? Onychomycosis  ?  S/p Podiatry eval ?Derm ref ?  ?  ? Relevant Orders  ? Ambulatory referral to Dermatology  ? Renal cell  cancer [facility])  ?  Seeing Dr Jeffie Pollock ?  ?  ? Relevant Medications  ? methylPREDNISolone (MEDROL DOSEPAK) 4 MG TBPK tablet  ?  ? ? ?Meds ordered this encounter  ?Medications  ? methylPREDNISolone (MEDROL DOSEPAK) 4 MG TBPK tablet  ?  Sig: As directed  ?  Dispense:  21 tablet  ?  Refill:  0  ?  ? ? ?Follow-up: Return in about 3 months (around 03/28/2022) for a follow-up visit. ? ?Walker Kehr, MD ?

## 2021-12-26 NOTE — Assessment & Plan Note (Signed)
S/p Podiatry eval ?Derm ref ?

## 2021-12-26 NOTE — Assessment & Plan Note (Signed)
Seeing Dr Jeffie Pollock ?

## 2021-12-26 NOTE — Assessment & Plan Note (Signed)
Probable radiculopathy ?Medrol pack ?Move more, ROM exerciseLS spine X ray ?

## 2021-12-26 NOTE — Assessment & Plan Note (Addendum)
We discussed steroid use and sugar level increase ?

## 2022-01-09 ENCOUNTER — Encounter: Payer: Self-pay | Admitting: Internal Medicine

## 2022-01-09 DIAGNOSIS — Z5111 Encounter for antineoplastic chemotherapy: Secondary | ICD-10-CM | POA: Diagnosis not present

## 2022-01-09 DIAGNOSIS — C673 Malignant neoplasm of anterior wall of bladder: Secondary | ICD-10-CM | POA: Diagnosis not present

## 2022-01-16 DIAGNOSIS — C673 Malignant neoplasm of anterior wall of bladder: Secondary | ICD-10-CM | POA: Diagnosis not present

## 2022-01-16 DIAGNOSIS — Z5111 Encounter for antineoplastic chemotherapy: Secondary | ICD-10-CM | POA: Diagnosis not present

## 2022-01-23 ENCOUNTER — Other Ambulatory Visit: Payer: Self-pay | Admitting: Internal Medicine

## 2022-01-23 DIAGNOSIS — C673 Malignant neoplasm of anterior wall of bladder: Secondary | ICD-10-CM | POA: Diagnosis not present

## 2022-01-23 DIAGNOSIS — B351 Tinea unguium: Secondary | ICD-10-CM

## 2022-01-23 DIAGNOSIS — Z5111 Encounter for antineoplastic chemotherapy: Secondary | ICD-10-CM | POA: Diagnosis not present

## 2022-02-04 ENCOUNTER — Other Ambulatory Visit: Payer: Self-pay | Admitting: Internal Medicine

## 2022-02-04 DIAGNOSIS — B351 Tinea unguium: Secondary | ICD-10-CM

## 2022-02-23 DIAGNOSIS — M792 Neuralgia and neuritis, unspecified: Secondary | ICD-10-CM | POA: Diagnosis not present

## 2022-02-23 DIAGNOSIS — L603 Nail dystrophy: Secondary | ICD-10-CM | POA: Diagnosis not present

## 2022-02-27 ENCOUNTER — Other Ambulatory Visit: Payer: Self-pay | Admitting: Internal Medicine

## 2022-02-28 DIAGNOSIS — L603 Nail dystrophy: Secondary | ICD-10-CM | POA: Diagnosis not present

## 2022-02-28 DIAGNOSIS — L608 Other nail disorders: Secondary | ICD-10-CM | POA: Diagnosis not present

## 2022-03-06 ENCOUNTER — Ambulatory Visit: Payer: Medicare Other | Admitting: Internal Medicine

## 2022-03-19 ENCOUNTER — Other Ambulatory Visit: Payer: Medicare Other

## 2022-03-19 LAB — COMPREHENSIVE METABOLIC PANEL
ALT: 18 U/L (ref 0–53)
AST: 12 U/L (ref 0–37)
Albumin: 4.3 g/dL (ref 3.5–5.2)
Alkaline Phosphatase: 107 U/L (ref 39–117)
BUN: 17 mg/dL (ref 6–23)
CO2: 27 mEq/L (ref 19–32)
Calcium: 9.8 mg/dL (ref 8.4–10.5)
Chloride: 107 mEq/L (ref 96–112)
Creatinine, Ser: 0.79 mg/dL (ref 0.40–1.50)
GFR: 91.83 mL/min (ref >60.00)
Glucose, Bld: 182 mg/dL — ABNORMAL HIGH (ref 70–99)
Potassium: 4 mEq/L (ref 3.5–5.1)
Sodium: 141 mEq/L (ref 135–145)
Total Bilirubin: 0.5 mg/dL (ref 0.2–1.2)
Total Protein: 7 g/dL (ref 6.0–8.3)

## 2022-03-21 ENCOUNTER — Ambulatory Visit (INDEPENDENT_AMBULATORY_CARE_PROVIDER_SITE_OTHER): Payer: Medicare Other | Admitting: Internal Medicine

## 2022-03-21 ENCOUNTER — Encounter: Payer: Self-pay | Admitting: Internal Medicine

## 2022-03-21 DIAGNOSIS — M5442 Lumbago with sciatica, left side: Secondary | ICD-10-CM

## 2022-03-21 DIAGNOSIS — I10 Essential (primary) hypertension: Secondary | ICD-10-CM | POA: Diagnosis not present

## 2022-03-21 DIAGNOSIS — I2583 Coronary atherosclerosis due to lipid rich plaque: Secondary | ICD-10-CM

## 2022-03-21 DIAGNOSIS — R972 Elevated prostate specific antigen [PSA]: Secondary | ICD-10-CM

## 2022-03-21 DIAGNOSIS — I700 Atherosclerosis of aorta: Secondary | ICD-10-CM

## 2022-03-21 DIAGNOSIS — M159 Polyosteoarthritis, unspecified: Secondary | ICD-10-CM | POA: Diagnosis not present

## 2022-03-21 DIAGNOSIS — D751 Secondary polycythemia: Secondary | ICD-10-CM

## 2022-03-21 DIAGNOSIS — M150 Primary generalized (osteo)arthritis: Secondary | ICD-10-CM

## 2022-03-21 DIAGNOSIS — G8929 Other chronic pain: Secondary | ICD-10-CM | POA: Diagnosis not present

## 2022-03-21 DIAGNOSIS — E118 Type 2 diabetes mellitus with unspecified complications: Secondary | ICD-10-CM | POA: Diagnosis not present

## 2022-03-21 DIAGNOSIS — I2510 Atherosclerotic heart disease of native coronary artery without angina pectoris: Secondary | ICD-10-CM | POA: Diagnosis not present

## 2022-03-21 NOTE — Assessment & Plan Note (Signed)
Chronic. 

## 2022-03-21 NOTE — Assessment & Plan Note (Signed)
Chronic Cont w/Olmesartan; Norvasc 

## 2022-03-21 NOTE — Assessment & Plan Note (Signed)
Cont on ASA 81 mg  QD

## 2022-03-21 NOTE — Assessment & Plan Note (Signed)
Not on statins - pt declined Cont w/Fish oil

## 2022-03-21 NOTE — Assessment & Plan Note (Signed)
OA/MSK Pain is better

## 2022-03-21 NOTE — Assessment & Plan Note (Signed)
F/u w/Dr Wrenn  

## 2022-03-21 NOTE — Assessment & Plan Note (Signed)
A1c is 7.2% Cont w/Synjardy, Prandin

## 2022-03-21 NOTE — Progress Notes (Signed)
Subjective:  Patient ID: [name] [name], male    DOB: 1955/12/18  Age: 66 y.o. MRN: [id number]  CC: 4 month f/u (No concerns. )   HPI [name] [name] presents for LBP, sciatica, DM, HTN f/u Leg is better A1c is 7.2%  Outpatient Medications Prior to Visit  Medication Sig Dispense Refill   acetaminophen (TYLENOL) 325 MG tablet Take 650 mg by mouth every 6 (six) hours as needed for mild pain.     amLODipine (NORVASC) 5 MG tablet TAKE 1 TABLET BY MOUTH EVERY DAY 90 tablet 3   Ascorbic Acid (VITAMIN C) 1000 MG tablet Take 1,000 mg by mouth daily as needed (immunine).     blood glucose meter kit and supplies KIT Use to test blood sugar once a day. DX: E11.9 1 each 0   Blood Glucose Monitoring Suppl (CONTOUR NEXT MONITOR) w/Device KIT 1 Device by Does not apply route daily. 1 kit 0   cholecalciferol (VITAMIN D) 1000 UNITS tablet Take 1,000 Units by mouth daily.     CONTOUR NEXT TEST test strip USE AS DIRECTED 100 strip 5   Empagliflozin-metFORMIN HCl ER (SYNJARDY XR) 12.02-999 MG TB24 Take 1 tablet by mouth 2 (two) times daily. 180 tablet 3   finasteride (PROSCAR) 5 MG tablet TAKE 1 TABLET BY MOUTH EVERY DAY (Patient taking differently: Take 5 mg by mouth daily.) 90 tablet 3   Lancets MISC Use to check blood sugar once per day. 100 each 3   olmesartan (BENICAR) 40 MG tablet TAKE 1 TABLET BY MOUTH EVERY DAY 90 tablet 3   polyethylene glycol (MIRALAX / GLYCOLAX) 17 g packet Take 17 g by mouth daily as needed for moderate constipation or severe constipation. 14 each 0   potassium chloride SA (KLOR-CON) 20 MEQ tablet Take 1 tablet (20 mEq total) by mouth daily. 90 tablet 11   repaglinide (PRANDIN) 2 MG tablet TAKE 1 TABLET BY MOUTH 3 TIMES A DAY BEFORE MEALS 270 tablet 3   tadalafil (CIALIS) 5 MG tablet Take 5 mg by mouth daily.     methylPREDNISolone (MEDROL DOSEPAK) 4 MG TBPK tablet As directed 21 tablet 0   No facility-administered medications prior to visit.    ROS: Review of Systems   Constitutional:  Negative for appetite change, fatigue and unexpected weight change.  HENT:  Negative for congestion, nosebleeds, sneezing, sore throat and trouble swallowing.   Eyes:  Negative for itching and visual disturbance.  Respiratory:  Negative for cough.   Cardiovascular:  Negative for chest pain, palpitations and leg swelling.  Gastrointestinal:  Negative for abdominal distention, blood in stool, diarrhea and nausea.  Genitourinary:  Negative for frequency and hematuria.  Musculoskeletal:  Positive for back pain. Negative for gait problem, joint swelling and neck pain.  Skin:  Negative for rash.  Neurological:  Negative for dizziness, tremors, speech difficulty and weakness.  Psychiatric/Behavioral:  Negative for agitation, dysphoric mood and sleep disturbance. The patient is not nervous/anxious.    Objective:  BP 130/80   Pulse 83   Temp 98.2 F (36.8 C) (Oral)   Resp (!) 98   Ht 5' 6" (1.676 m)   Wt 183 lb 6.4 oz (83.2 kg)   BMI 29.60 kg/m   BP Readings from Last 3 Encounters:  03/21/22 130/80  12/26/21 (!) 160/98  11/06/21 140/82    Wt Readings from Last 3 Encounters:  03/21/22 183 lb 6.4 oz (83.2 kg)  12/26/21 187 lb (84.8 kg)  11/06/21 190 lb 12.8 oz (  86.5 kg)    Physical Exam Constitutional:      General: He is not in acute distress.    Appearance: He is well-developed. He is obese.     Comments: NAD  Eyes:     Conjunctiva/sclera: Conjunctivae normal.     Pupils: Pupils are equal, round, and reactive to light.  Neck:     Thyroid: No thyromegaly.     Vascular: No JVD.  Cardiovascular:     Rate and Rhythm: Normal rate and regular rhythm.     Heart sounds: Normal heart sounds. No murmur heard.   No friction rub. No gallop.  Pulmonary:     Effort: Pulmonary effort is normal. No respiratory distress.     Breath sounds: Normal breath sounds. No wheezing or rales.  Chest:     Chest wall: No tenderness.  Abdominal:     General: Bowel sounds are  normal. There is no distension.     Palpations: Abdomen is soft. There is no mass.     Tenderness: There is no abdominal tenderness. There is no guarding or rebound.  Musculoskeletal:        General: Tenderness present. Normal range of motion.     Cervical back: Normal range of motion.  Lymphadenopathy:     Cervical: No cervical adenopathy.  Skin:    General: Skin is warm and dry.     Findings: No rash.  Neurological:     Mental Status: He is alert and oriented to person, place, and time.     Cranial Nerves: No cranial nerve deficit.     Motor: No abnormal muscle tone.     Coordination: Coordination normal.     Gait: Gait normal.     Deep Tendon Reflexes: Reflexes are normal and symmetric.  Psychiatric:        Behavior: Behavior normal.        Thought Content: Thought content normal.        Judgment: Judgment normal.  LS is tender w/ROM  Lab Results  Component Value Date   WBC 5.6 11/03/2021   HGB 16.9 11/03/2021   HCT 51.1 11/03/2021   PLT 142.0 (L) 11/03/2021   GLUCOSE 182 (H) 03/19/2022   CHOL 153 03/06/2021   TRIG 170.0 (H) 03/06/2021   HDL 33.50 (L) 03/06/2021   LDLCALC 86 03/06/2021   ALT 18 03/19/2022   AST 12 03/19/2022   NA 141 03/19/2022   K 4.0 03/19/2022   CL 107 03/19/2022   CREATININE 0.79 03/19/2022   BUN 17 03/19/2022   CO2 27 03/19/2022   TSH 0.64 03/06/2021   PSA 3.87 03/06/2021   INR 1.1 (H) 01/08/2012   HGBA1C 7.2 (H) 11/03/2021   MICROALBUR 1.3 03/06/2021    No results found.  Assessment & Plan:   Problem List Items Addressed This Visit     Atherosclerosis of aorta ([place])    Not on statins - pt declined Cont w/Fish oil       Coronary atherosclerosis    Not on statins - pt declined Cont w/Fish oil       DM type 2, controlled, with complication ([facility])    S0F is 7.2% Cont w/Synjardy, Prandin      Essential hypertension    Chronic Cont w/Olmesartan; Norvasc      LOW BACK PAIN    OA/MSK Pain is better        Osteoarthritis    Chronic       Polycythemia, secondary    Cont  on ASA 81 mg  QD      PSA, INCREASED    F/u w/Dr Jeffie Pollock          No orders of the defined types were placed in this encounter.     Follow-up: No follow-ups on file.  Walker Kehr, MD

## 2022-03-23 DIAGNOSIS — H25813 Combined forms of age-related cataract, bilateral: Secondary | ICD-10-CM | POA: Diagnosis not present

## 2022-03-23 DIAGNOSIS — H52223 Regular astigmatism, bilateral: Secondary | ICD-10-CM | POA: Diagnosis not present

## 2022-03-23 DIAGNOSIS — H35033 Hypertensive retinopathy, bilateral: Secondary | ICD-10-CM | POA: Diagnosis not present

## 2022-03-23 DIAGNOSIS — L603 Nail dystrophy: Secondary | ICD-10-CM | POA: Diagnosis not present

## 2022-03-23 DIAGNOSIS — E1142 Type 2 diabetes mellitus with diabetic polyneuropathy: Secondary | ICD-10-CM | POA: Diagnosis not present

## 2022-03-23 DIAGNOSIS — H5203 Hypermetropia, bilateral: Secondary | ICD-10-CM | POA: Diagnosis not present

## 2022-03-23 DIAGNOSIS — E119 Type 2 diabetes mellitus without complications: Secondary | ICD-10-CM | POA: Diagnosis not present

## 2022-03-23 DIAGNOSIS — B351 Tinea unguium: Secondary | ICD-10-CM | POA: Diagnosis not present

## 2022-03-23 DIAGNOSIS — H524 Presbyopia: Secondary | ICD-10-CM | POA: Diagnosis not present

## 2022-03-26 DIAGNOSIS — Z8551 Personal history of malignant neoplasm of bladder: Secondary | ICD-10-CM | POA: Diagnosis not present

## 2022-03-27 ENCOUNTER — Encounter: Payer: Self-pay | Admitting: Internal Medicine

## 2022-03-28 ENCOUNTER — Ambulatory Visit: Payer: Medicare Other | Admitting: Internal Medicine

## 2022-04-03 DIAGNOSIS — Z5111 Encounter for antineoplastic chemotherapy: Secondary | ICD-10-CM | POA: Diagnosis not present

## 2022-04-03 DIAGNOSIS — C673 Malignant neoplasm of anterior wall of bladder: Secondary | ICD-10-CM | POA: Diagnosis not present

## 2022-04-10 DIAGNOSIS — C673 Malignant neoplasm of anterior wall of bladder: Secondary | ICD-10-CM | POA: Diagnosis not present

## 2022-04-10 DIAGNOSIS — Z5111 Encounter for antineoplastic chemotherapy: Secondary | ICD-10-CM | POA: Diagnosis not present

## 2022-04-24 DIAGNOSIS — Z5111 Encounter for antineoplastic chemotherapy: Secondary | ICD-10-CM | POA: Diagnosis not present

## 2022-04-24 DIAGNOSIS — C673 Malignant neoplasm of anterior wall of bladder: Secondary | ICD-10-CM | POA: Diagnosis not present

## 2022-04-25 DIAGNOSIS — H2513 Age-related nuclear cataract, bilateral: Secondary | ICD-10-CM | POA: Diagnosis not present

## 2022-04-25 DIAGNOSIS — E113211 Type 2 diabetes mellitus with mild nonproliferative diabetic retinopathy with macular edema, right eye: Secondary | ICD-10-CM | POA: Diagnosis not present

## 2022-04-25 DIAGNOSIS — H35033 Hypertensive retinopathy, bilateral: Secondary | ICD-10-CM | POA: Diagnosis not present

## 2022-05-28 DIAGNOSIS — H2513 Age-related nuclear cataract, bilateral: Secondary | ICD-10-CM | POA: Diagnosis not present

## 2022-05-28 DIAGNOSIS — H35033 Hypertensive retinopathy, bilateral: Secondary | ICD-10-CM | POA: Diagnosis not present

## 2022-05-28 DIAGNOSIS — E113211 Type 2 diabetes mellitus with mild nonproliferative diabetic retinopathy with macular edema, right eye: Secondary | ICD-10-CM | POA: Diagnosis not present

## 2022-06-05 ENCOUNTER — Ambulatory Visit: Payer: Medicare Other | Admitting: Internal Medicine

## 2022-06-25 DIAGNOSIS — H2512 Age-related nuclear cataract, left eye: Secondary | ICD-10-CM | POA: Diagnosis not present

## 2022-06-25 DIAGNOSIS — H2513 Age-related nuclear cataract, bilateral: Secondary | ICD-10-CM | POA: Diagnosis not present

## 2022-07-10 DIAGNOSIS — H269 Unspecified cataract: Secondary | ICD-10-CM | POA: Diagnosis not present

## 2022-07-10 DIAGNOSIS — H2512 Age-related nuclear cataract, left eye: Secondary | ICD-10-CM | POA: Diagnosis not present

## 2022-07-16 ENCOUNTER — Other Ambulatory Visit (INDEPENDENT_AMBULATORY_CARE_PROVIDER_SITE_OTHER): Payer: Medicare Other

## 2022-07-16 DIAGNOSIS — D751 Secondary polycythemia: Secondary | ICD-10-CM

## 2022-07-16 DIAGNOSIS — E118 Type 2 diabetes mellitus with unspecified complications: Secondary | ICD-10-CM

## 2022-07-16 DIAGNOSIS — H2511 Age-related nuclear cataract, right eye: Secondary | ICD-10-CM | POA: Diagnosis not present

## 2022-07-16 LAB — COMPREHENSIVE METABOLIC PANEL
ALT: 21 U/L (ref 0–53)
AST: 16 U/L (ref 0–37)
Albumin: 4.2 g/dL (ref 3.5–5.2)
Alkaline Phosphatase: 104 U/L (ref 39–117)
BUN: 19 mg/dL (ref 6–23)
CO2: 23 mEq/L (ref 19–32)
Calcium: 9.3 mg/dL (ref 8.4–10.5)
Chloride: 106 mEq/L (ref 96–112)
Creatinine, Ser: 0.84 mg/dL (ref 0.40–1.50)
GFR: 89.94 mL/min (ref >60.00)
Glucose, Bld: 156 mg/dL — ABNORMAL HIGH (ref 70–99)
Potassium: 3.8 mEq/L (ref 3.5–5.1)
Sodium: 139 mEq/L (ref 135–145)
Total Bilirubin: 0.4 mg/dL (ref 0.2–1.2)
Total Protein: 6.9 g/dL (ref 6.0–8.3)

## 2022-07-16 LAB — CBC WITH DIFFERENTIAL/PLATELET
Basophils Absolute: 0.1 10*3/uL (ref 0.0–0.1)
Basophils Relative: 1 % (ref 0.0–3.0)
Eosinophils Absolute: 0.2 10*3/uL (ref 0.0–0.7)
Eosinophils Relative: 2.8 % (ref 0.0–5.0)
HCT: 52.6 % — ABNORMAL HIGH (ref 39.0–52.0)
Hemoglobin: 17.8 g/dL — ABNORMAL HIGH (ref 13.0–17.0)
Lymphocytes Relative: 14.2 % (ref 12.0–46.0)
Lymphs Abs: 1.2 10*3/uL (ref 0.7–4.0)
MCHC: 33.8 g/dL (ref 30.0–36.0)
MCV: 87.4 fl (ref 78.0–100.0)
Monocytes Absolute: 0.6 10*3/uL (ref 0.1–1.0)
Monocytes Relative: 8 % (ref 3.0–12.0)
Neutro Abs: 6 10*3/uL (ref 1.4–7.7)
Neutrophils Relative %: 74 % (ref 43.0–77.0)
Platelets: 127 10*3/uL — ABNORMAL LOW (ref 150.0–400.0)
RBC: 6.02 Mil/uL — ABNORMAL HIGH (ref 4.22–5.81)
RDW: 14.5 % (ref 11.5–15.5)
WBC: 8.1 10*3/uL (ref 4.0–10.5)

## 2022-07-16 LAB — HEMOGLOBIN A1C: Hgb A1c MFr Bld: 9.3 % — ABNORMAL HIGH (ref 4.6–6.5)

## 2022-07-17 ENCOUNTER — Ambulatory Visit (INDEPENDENT_AMBULATORY_CARE_PROVIDER_SITE_OTHER): Payer: Medicare Other | Admitting: Internal Medicine

## 2022-07-17 ENCOUNTER — Encounter: Payer: Self-pay | Admitting: Internal Medicine

## 2022-07-17 DIAGNOSIS — F17200 Nicotine dependence, unspecified, uncomplicated: Secondary | ICD-10-CM

## 2022-07-17 DIAGNOSIS — Z5320 Procedure and treatment not carried out because of patient's decision for unspecified reasons: Secondary | ICD-10-CM | POA: Diagnosis not present

## 2022-07-17 DIAGNOSIS — E118 Type 2 diabetes mellitus with unspecified complications: Secondary | ICD-10-CM | POA: Diagnosis not present

## 2022-07-17 DIAGNOSIS — I10 Essential (primary) hypertension: Secondary | ICD-10-CM | POA: Diagnosis not present

## 2022-07-17 DIAGNOSIS — E785 Hyperlipidemia, unspecified: Secondary | ICD-10-CM | POA: Diagnosis not present

## 2022-07-17 DIAGNOSIS — R413 Other amnesia: Secondary | ICD-10-CM | POA: Insufficient documentation

## 2022-07-17 MED ORDER — RYBELSUS 3 MG PO TABS: 3.0000 mg | ORAL_TABLET | Freq: Every day | ORAL | 3 refills | Status: DC

## 2022-07-17 NOTE — Assessment & Plan Note (Signed)
Pt declined statins 

## 2022-07-17 NOTE — Assessment & Plan Note (Signed)
Not interested

## 2022-07-17 NOTE — Assessment & Plan Note (Signed)
Worse Smoking 2 joints a day - getting munchies a lot Cont w/Synjardy, Prandin Will add Rybelsus po

## 2022-07-17 NOTE — Assessment & Plan Note (Signed)
Mild - cut back or d/c weed smoking was adviced

## 2022-07-17 NOTE — Assessment & Plan Note (Signed)
Elevated BP Loose wt Cont w/Olmesartan; Norvasc

## 2022-07-17 NOTE — Assessment & Plan Note (Signed)
Smoking 2 joints a day - getting munchies a lot Smoking cigarettes too

## 2022-07-17 NOTE — Progress Notes (Signed)
Subjective:  Patient ID: [name] [name], male    DOB: [birth year]  Age: 66 y.o. MRN: [id number]  CC: Follow-up (4 month f/u)   HPI [name] [name] presents for DM, CRI, HTN C/o memory issues   Outpatient Medications Prior to Visit  Medication Sig Dispense Refill   acetaminophen (TYLENOL) 325 MG tablet Take 650 mg by mouth every 6 (six) hours as needed for mild pain.     alfuzosin (UROXATRAL) 10 MG 24 hr tablet Take 10 mg by mouth daily. Take 10 mg by mouth daily.     amLODipine (NORVASC) 5 MG tablet TAKE 1 TABLET BY MOUTH EVERY DAY 90 tablet 3   Ascorbic Acid (VITAMIN C) 1000 MG tablet Take 1,000 mg by mouth daily as needed (immunine).     blood glucose meter kit and supplies KIT Use to test blood sugar once a day. DX: E11.9 1 each 0   Blood Glucose Monitoring Suppl (CONTOUR NEXT MONITOR) w/Device KIT 1 Device by Does not apply route daily. 1 kit 0   cholecalciferol (VITAMIN D) 1000 UNITS tablet Take 1,000 Units by mouth daily.     CONTOUR NEXT TEST test strip USE AS DIRECTED 100 strip 5   Empagliflozin-metFORMIN HCl ER (SYNJARDY XR) 12.02-999 MG TB24 Take 1 tablet by mouth 2 (two) times daily. 180 tablet 3   finasteride (PROSCAR) 5 MG tablet TAKE 1 TABLET BY MOUTH EVERY DAY (Patient taking differently: Take 5 mg by mouth daily.) 90 tablet 3   ketoconazole (NIZORAL) 2 % cream Apply topically daily. Apply topically daily.     Lancets MISC Use to check blood sugar once per day. 100 each 3   olmesartan (BENICAR) 40 MG tablet TAKE 1 TABLET BY MOUTH EVERY DAY 90 tablet 3   polyethylene glycol (MIRALAX / GLYCOLAX) 17 g packet Take 17 g by mouth daily as needed for moderate constipation or severe constipation. 14 each 0   potassium chloride SA (KLOR-CON) 20 MEQ tablet Take 1 tablet (20 mEq total) by mouth daily. 90 tablet 11   repaglinide (PRANDIN) 2 MG tablet TAKE 1 TABLET BY MOUTH 3 TIMES A DAY BEFORE MEALS 270 tablet 3   tadalafil (CIALIS) 5 MG tablet Take 5 mg by mouth daily.     No  facility-administered medications prior to visit.    ROS: Review of Systems  Constitutional:  Positive for unexpected weight change. Negative for appetite change and fatigue.  HENT:  Negative for congestion, nosebleeds, sneezing, sore throat and trouble swallowing.   Eyes:  Negative for itching and visual disturbance.  Respiratory:  Negative for cough.   Cardiovascular:  Negative for chest pain, palpitations and leg swelling.  Gastrointestinal:  Negative for abdominal distention, blood in stool, diarrhea and nausea.  Genitourinary:  Negative for frequency and hematuria.  Musculoskeletal:  Negative for back pain, gait problem, joint swelling and neck pain.  Skin:  Negative for rash.  Neurological:  Negative for dizziness, tremors, speech difficulty and weakness.  Psychiatric/Behavioral:  Positive for decreased concentration. Negative for agitation, dysphoric mood, sleep disturbance and suicidal ideas. The patient is not nervous/anxious.     Objective:  BP (!) 144/70 (BP Location: Left Arm)   Pulse 70   Temp 98.9 F (37.2 C) (Oral)   Ht $R'5\' 6"'tj$  (1.676 m)   Wt 188 lb 9.6 oz (85.5 kg)   SpO2 97%   BMI 30.44 kg/m   BP Readings from Last 3 Encounters:  07/17/22 (!) 144/70  03/21/22 130/80  12/26/21 Marland Kitchen)  160/98    Wt Readings from Last 3 Encounters:  07/17/22 188 lb 9.6 oz (85.5 kg)  03/21/22 183 lb 6.4 oz (83.2 kg)  12/26/21 187 lb (84.8 kg)    Physical Exam Constitutional:      General: He is not in acute distress.    Appearance: He is well-developed.     Comments: NAD  Eyes:     Conjunctiva/sclera: Conjunctivae normal.     Pupils: Pupils are equal, round, and reactive to light.  Neck:     Thyroid: No thyromegaly.     Vascular: No JVD.  Cardiovascular:     Rate and Rhythm: Normal rate and regular rhythm.     Heart sounds: Normal heart sounds. No murmur heard.    No friction rub. No gallop.  Pulmonary:     Effort: Pulmonary effort is normal. No respiratory distress.      Breath sounds: Normal breath sounds. No wheezing or rales.  Chest:     Chest wall: No tenderness.  Abdominal:     General: Bowel sounds are normal. There is no distension.     Palpations: Abdomen is soft. There is no mass.     Tenderness: There is no abdominal tenderness. There is no guarding or rebound.  Musculoskeletal:        General: No tenderness. Normal range of motion.     Cervical back: Normal range of motion.  Lymphadenopathy:     Cervical: No cervical adenopathy.  Skin:    General: Skin is warm and dry.     Findings: No rash.  Neurological:     Mental Status: He is alert and oriented to person, place, and time.     Cranial Nerves: No cranial nerve deficit.     Motor: No abnormal muscle tone.     Coordination: Coordination normal.     Gait: Gait normal.     Deep Tendon Reflexes: Reflexes are normal and symmetric.  Psychiatric:        Behavior: Behavior normal.        Thought Content: Thought content normal.        Judgment: Judgment normal.     Lab Results  Component Value Date   WBC 8.1 07/16/2022   HGB 17.8 (H) 07/16/2022   HCT 52.6 (H) 07/16/2022   PLT 127.0 (L) 07/16/2022   GLUCOSE 156 (H) 07/16/2022   CHOL 153 03/06/2021   TRIG 170.0 (H) 03/06/2021   HDL 33.50 (L) 03/06/2021   LDLCALC 86 03/06/2021   ALT 21 07/16/2022   AST 16 07/16/2022   NA 139 07/16/2022   K 3.8 07/16/2022   CL 106 07/16/2022   CREATININE 0.84 07/16/2022   BUN 19 07/16/2022   CO2 23 07/16/2022   TSH 0.64 03/06/2021   PSA 3.87 03/06/2021   INR 1.1 (H) 01/08/2012   HGBA1C 9.3 (H) 07/16/2022   MICROALBUR 1.3 03/06/2021    No results found.  Assessment & Plan:   Problem List Items Addressed This Visit     DM type 2, controlled, with complication ([place])    Worse Smoking 2 joints a day - getting munchies a lot Cont w/Synjardy, Prandin Will add Rybelsus po      Relevant Medications   Semaglutide (RYBELSUS) 3 MG TABS   Other Relevant Orders   Comprehensive metabolic  panel   Hemoglobin A1c   Dyslipidemia    Pt declined statins      Essential hypertension    Elevated BP Loose wt Cont w/Olmesartan; Norvasc  Statin declined    Not interested      Tobacco use disorder    Smoking 2 joints a day - getting munchies a lot Smoking cigarettes too         Meds ordered this encounter  Medications   Semaglutide (RYBELSUS) 3 MG TABS    Sig: Take 3 mg by mouth daily.    Dispense:  30 tablet    Refill:  3      Follow-up: No follow-ups on file.  Walker Kehr, MD

## 2022-07-18 ENCOUNTER — Ambulatory Visit: Payer: Medicare Other | Admitting: Internal Medicine

## 2022-07-25 ENCOUNTER — Encounter: Payer: Self-pay | Admitting: Internal Medicine

## 2022-08-07 DIAGNOSIS — H2511 Age-related nuclear cataract, right eye: Secondary | ICD-10-CM | POA: Diagnosis not present

## 2022-08-07 DIAGNOSIS — H269 Unspecified cataract: Secondary | ICD-10-CM | POA: Diagnosis not present

## 2022-08-07 DIAGNOSIS — H25811 Combined forms of age-related cataract, right eye: Secondary | ICD-10-CM | POA: Diagnosis not present

## 2022-08-09 ENCOUNTER — Other Ambulatory Visit: Payer: Self-pay | Admitting: Internal Medicine

## 2022-08-09 ENCOUNTER — Encounter: Payer: Self-pay | Admitting: Internal Medicine

## 2022-08-09 ENCOUNTER — Ambulatory Visit: Payer: Medicare Other | Admitting: Physician Assistant

## 2022-08-09 MED ORDER — RYBELSUS 3 MG PO TABS: 3.0000 mg | ORAL_TABLET | Freq: Every day | ORAL | 3 refills | Status: DC

## 2022-08-30 ENCOUNTER — Ambulatory Visit (INDEPENDENT_AMBULATORY_CARE_PROVIDER_SITE_OTHER): Payer: Medicare Other

## 2022-08-30 VITALS — Ht 66.0 in | Wt 189.0 lb

## 2022-08-30 DIAGNOSIS — Z0000 Encounter for general adult medical examination without abnormal findings: Secondary | ICD-10-CM | POA: Diagnosis not present

## 2022-08-30 NOTE — Progress Notes (Cosign Needed Addendum)
Virtual Visit via Telephone Note  I connected with  [name] [name] on 08/30/22 at  3:15 PM EST by telephone and verified that I am speaking with the correct person using two identifiers.  Location: Patient: Home Provider: [place] Persons participating in the virtual visit: [place]   I discussed the limitations, risks, security and privacy concerns of performing an evaluation and management service by telephone and the availability of in person appointments. The patient expressed understanding and agreed to proceed.  Interactive audio and video telecommunications were attempted between this nurse and patient, however failed, due to patient having technical difficulties OR patient did not have access to video capability.  We continued and completed visit with audio only.  Some vital signs may be absent or patient reported.   Sheral Flow, LPN  Subjective:   [name] [name] is a 66 y.o. male who presents for an Initial Medicare Annual Wellness Visit.  Review of Systems     Cardiac Risk Factors include: advanced age (>48mn, >>64women);diabetes mellitus;hypertension;male gender;obesity (BMI >30kg/m2);sedentary lifestyle;dyslipidemia;smoking/ tobacco exposure     Objective:    Today's Vitals   08/30/22 1531  Weight: 189 lb (85.7 kg)  Height: 5' 6" (1.676 m)  PainSc: 0-No pain   Body mass index is 30.51 kg/m.     08/30/2022    3:33 PM 09/16/2021    9:35 PM 09/16/2021    8:02 AM 09/01/2021   10:41 AM 01/06/2021    7:41 AM 11/29/2020    8:30 AM 09/29/2019    8:27 AM  Advanced Directives  Does Patient Have a Medical Advance Directive? _0  No No  Would patient like information on creating a medical advance directive? No - Patient declined No - Patient declined  No - Patient declined No - Patient declined No - Patient declined No - Guardian declined    Current Medications (verified) Outpatient Encounter Medications as of  08/30/2022  Medication Sig   acetaminophen (TYLENOL) 325 MG tablet Take 650 mg by mouth every 6 (six) hours as needed for mild pain.   alfuzosin (UROXATRAL) 10 MG 24 hr tablet Take 10 mg by mouth daily. Take 10 mg by mouth daily.   amLODipine (NORVASC) 5 MG tablet TAKE 1 TABLET BY MOUTH EVERY DAY   Ascorbic Acid (VITAMIN C) 1000 MG tablet Take 1,000 mg by mouth daily as needed (immunine).   blood glucose meter kit and supplies KIT Use to test blood sugar once a day. DX: E11.9   Blood Glucose Monitoring Suppl (CONTOUR NEXT MONITOR) w/Device KIT 1 Device by Does not apply route daily.   cholecalciferol (VITAMIN D) 1000 UNITS tablet Take 1,000 Units by mouth daily.   CONTOUR NEXT TEST test strip USE AS DIRECTED   Empagliflozin-metFORMIN HCl ER (SYNJARDY XR) 12.02-999 MG TB24 Take 1 tablet by mouth 2 (two) times daily. (Patient not taking: Reported on 08/30/2022)   finasteride (PROSCAR) 5 MG tablet TAKE 1 TABLET BY MOUTH EVERY DAY (Patient taking differently: Take 5 mg by mouth daily.)   ketoconazole (NIZORAL) 2 % cream Apply topically daily. Apply topically daily.   Lancets MISC Use to check blood sugar once per day.   olmesartan (BENICAR) 40 MG tablet TAKE 1 TABLET BY MOUTH EVERY DAY   polyethylene glycol (MIRALAX / GLYCOLAX) 17 g packet Take 17 g by mouth daily as needed for moderate constipation or severe constipation.   potassium chloride SA (KLOR-CON) 20 MEQ tablet Take 1 tablet (20 mEq  total) by mouth daily.   repaglinide (PRANDIN) 2 MG tablet TAKE 1 TABLET BY MOUTH 3 TIMES A DAY BEFORE MEALS   Semaglutide (RYBELSUS) 3 MG TABS Take 3 mg by mouth daily.   tadalafil (CIALIS) 5 MG tablet Take 5 mg by mouth daily.   No facility-administered encounter medications on file as of 08/30/2022.    Allergies (verified) Tamsulosin, Codeine, and Wound dressing adhesive   History: Past Medical History:  Diagnosis Date   Benign localized prostatic hyperplasia with lower urinary tract symptoms (LUTS)     urologist--- dr Jeffie Pollock   Bladder cancer [facility])    urologist---- dr Jeffie Pollock---  s/p TURBT 11/2020; 03/ 2022   Coronary artery disease    DDD (degenerative disc disease), cervical    From 1980s   Diabetes mellitus type II    followed by pcp--   (01-05-2021  per pt checks blood sugar three times per week,  fasting sugar --- 130--140_   Difficult intubation 07/28/2012   partial nephrectomy done _0  07-28-2012  found to have anterior larynx , diffcult intubation, referred to anesthesia record in epic    Dyslipidemia    ED (erectile dysfunction)    Elevated PSA    GERD (gastroesophageal reflux disease)    History of adenomatous polyp of colon    History of kidney stones    History of prostatitis 2008   History of renal carcinoma urologist--- dr Jeffie Pollock   07-28-2012 for renal neoplasm  s/p right partal nephrectomy    Hypertension    followed by pcp   Osteoarthritis    Back   Past Surgical History:  Procedure Laterality Date   COLONOSCOPY N/A 03/03/2013   Procedure: COLONOSCOPY;  Surgeon: Irene Shipper, MD;  Location: [facility];  Service: Endoscopy;  Laterality: N/A;   COLONOSCOPY WITH PROPOFOL N/A 09/29/2019   Procedure: COLONOSCOPY WITH PROPOFOL;  Surgeon: Irene Shipper, MD;  Location: [facility];  Service: Endoscopy;  Laterality: N/A;   CYSTOSCOPY WITH BIOPSY N/A 12/09/2020   Procedure: CYSTOSCOPY WITH TURBT, BIOPSY AND FULGURATION;  Surgeon: Irine Seal, MD;  Location: [facility];  Service: Urology;  Laterality: N/A;  REQUESTING [place] LITHOTRIPSY  01-10-2015;  03-03-2012  _1    POLYPECTOMY  09/29/2019   Procedure: POLYPECTOMY;  Surgeon: Irene Shipper, MD;  Location: [facility];  Service: Endoscopy;;   ROBOT ASSISTED LAPAROSCOPIC PARTIAL NEPHRECTOMY  07-28-2012   DR Alinda Money  _2    TONSILLECTOMY  1965   TRANSURETHRAL RESECTION OF BLADDER TUMOR WITH MITOMYCIN-C N/A 01/06/2021   Procedure: TRANSURETHRAL RESECTION OF BLADDER TUMOR WITH GEMCITIBINE IN PACU;   Surgeon: Irine Seal, MD;  Location: [facility];  Service: Urology;  Laterality: N/A;   TRANSURETHRAL RESECTION OF BLADDER TUMOR WITH MITOMYCIN-C N/A 09/01/2021   Procedure: TRANSURETHRAL RESECTION OF BLADDER TUMOR WITH POST OPERATIVE INSTILLATION OF GEMCITABINE;  Surgeon: Irine Seal, MD;  Location: [facility];  Service: Urology;  Laterality: N/A;   Family History  Problem Relation Age of Onset   Lymphoma Mother    Colon cancer Mother 74   Colon cancer Paternal Aunt    Esophageal cancer Neg Hx    Rectal cancer Neg Hx    Stomach cancer Neg Hx    Social History   Socioeconomic History   Marital status: Married    Spouse name: Not on file   Number of children: Not on file   Years of education: Not on file   Highest education level: Not on  file  Occupational History   Not on file  Tobacco Use   Smoking status: Every Day    Packs/day: 0.25    Years: 30.00    Total pack years: 7.50    Types: Cigarettes   Smokeless tobacco: Never  Vaping Use   Vaping Use: Never used  Substance and Sexual Activity   Alcohol use: No    Comment: less than 1 beer a month   Drug use: Yes    Frequency: 4.0 times per week    Types: Marijuana    Comment: marijuana-smokes daily, last 08/31/21   Sexual activity: Yes  Other Topics Concern   Not on file  Social History Narrative   Occupation: Personal assistant   Current smoker   Regular exercise- no   Married         Social Determinants of Health   Financial Resource Strain: Low Risk  (08/30/2022)   Overall Financial Resource Strain (CARDIA)    Difficulty of Paying Living Expenses: Not hard at all  Food Insecurity: No Food Insecurity (08/30/2022)   Hunger Vital Sign    Worried About Running Out of Food in the Last Year: Never true    Ran Out of Food in the Last Year: Never true  Transportation Needs: No Transportation Needs (08/30/2022)   PRAPARE - Hydrologist (Medical): No    Lack of  Transportation (Non-Medical): No  Physical Activity: Inactive (08/30/2022)   Exercise Vital Sign    Days of Exercise per Week: 0 days    Minutes of Exercise per Session: 0 min  Stress: No Stress Concern Present (08/30/2022)   [place]    Feeling of Stress : Not at all  Social Connections: [place] (08/30/2022)   Social Connection and Isolation Panel [NHANES]    Frequency of Communication with Friends and Family: More than three times a week    Frequency of Social Gatherings with Friends and Family: More than three times a week    Attends Religious Services: More than 4 times per year    Active Member of [employer] or Organizations: Yes    Attends Music therapist: More than 4 times per year    Marital Status: Married    Tobacco Counseling Ready to quit: Not Answered Counseling given: Not Answered   Clinical Intake:  Pre-visit preparation completed: Yes  Pain : No/denies pain Pain Score: 0-No pain     BMI - recorded: 30.51 Nutritional Status: BMI > 30  Obese Nutritional Risks: None Diabetes: No  How often do you need to have someone help you when you read instructions, pamphlets, or other written materials from your doctor or pharmacy?: 1 - Never What is the last grade level you completed in school?: HSG  Nutrition Risk Assessment:  Has the patient had any N/V/D within the last 2 months?  No  Does the patient have any non-healing wounds?  No  Has the patient had any unintentional weight loss or weight gain?  No   Diabetes:  Is the patient diabetic?  Yes  If diabetic, was a CBG obtained today?  No  Did the patient bring in their glucometer from home?  No  How often do you monitor your CBG's? daily.   Financial Strains and Diabetes Management:  Are you having any financial strains with the device, your supplies or your medication? No .  Does the patient want to be seen by  Chronic Care  Management for management of their diabetes?  No  Would the patient like to be referred to a Nutritionist or for Diabetic Management?  No   Diabetic Exams:  Diabetic Eye Exam: Completed 03/23/2022  Diabetic Foot Exam: Overdue, Pt has been advised about the importance in completing this exam.    Interpreter Needed?: No  Information entered by :: Lisette Abu, LPN.   Activities of Daily Living    08/30/2022    3:35 PM 09/16/2021    9:35 PM  In your present state of health, do you have any difficulty performing the following activities:  Hearing? 1 0  Vision? 0 0  Difficulty concentrating or making decisions? 0 0  Walking or climbing stairs? 0 0  Dressing or bathing? 0 0  Doing errands, shopping? 0 1  Preparing Food and eating ? N   Using the Toilet? N   In the past six months, have you accidently leaked urine? N   Do you have problems with loss of bowel control? N   Managing your Medications? N   Managing your Finances? N   Housekeeping or managing your Housekeeping? N     Patient Care Team: Plotnikov, Evie Lacks, MD as PCP - General Irine Seal, MD as Attending Physician (Urology)  Indicate any recent Medical Services you may have received from other than [facility] providers in the past year (date may be approximate).     Assessment:   This is a routine wellness examination for [employer].  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 No hearing aids. Vision Screening - Comments:: Patient had cataracts removed on both eyes.  Patient up to date with routine eye exams with Dr. Gala Romney and Ritesh Poudyal, OD.   Dietary issues and exercise activities discussed: Current Exercise Habits: The patient does not participate in regular exercise at present, Exercise limited by: orthopedic condition(s);Other - see comments (bladder cancer)   Goals Addressed             This Visit's Progress     Client understands the importance of follow-up with providers by attending scheduled visits.       "I want to continue to progress with my bladder cancer and stay cancer free"      Depression Screen    08/30/2022    3:26 PM 03/21/2022    8:22 AM 12/26/2021   11:15 AM 12/07/2020    4:20 PM 10/20/2019   10:12 AM 01/18/2017    4:12 PM  PHQ 2/9 Scores  PHQ - 2 Score 0 0 0 0 0 0  PHQ- 9 Score  0  0      Fall Risk    08/30/2022    3:24 PM 03/21/2022    8:21 AM 12/26/2021   11:15 AM 12/07/2020    4:21 PM 10/20/2019   10:13 AM  [place] in the past year? 0 0 0 1 0  Number falls in past yr: 0 0  1   Injury with Fall? 0 0  1   Risk for fall due to : No Fall Risks   Impaired balance/gait   Follow up Falls prevention discussed        FALL RISK PREVENTION PERTAINING TO THE HOME:  Any stairs in or around the home? Yes  If so, are there any without handrails? No  Home free of loose throw rugs in walkways, pet beds, electrical cords, etc? Yes  Adequate lighting in  your home to reduce risk of falls? Yes   ASSISTIVE DEVICES UTILIZED TO PREVENT FALLS:  Life alert? No  Use of a cane, walker or w/c? No  Grab bars in the bathroom? No  Shower chair or bench in shower? No  Elevated toilet seat or a handicapped toilet? No   TIMED UP AND GO:  Was the test performed? No . Phone Visit  Cognitive Function:        08/30/2022    3:26 PM  6CIT Screen  What Year? 0 points  What month? 0 points  What time? 0 points  Count back from 20 0 points  Months in reverse 0 points  Repeat phrase 0 points  Total Score 0 points    Immunizations Immunization History  Administered Date(s) Administered   PFIZER(Purple Top)SARS-COV-2 Vaccination 01/23/2020, 02/15/2020, 10/05/2020   Td 01/13/2006, 09/15/2009   Tdap 06/04/2016    TDAP status: Up to date  Flu Vaccine status: Declined, Education has been provided regarding the importance of this vaccine but patient still declined. Advised may  receive this vaccine at local pharmacy or Health Dept. Aware to provide a copy of the vaccination record if obtained from local pharmacy or Health Dept. Verbalized acceptance and understanding.  Pneumococcal vaccine status: Declined,  Education has been provided regarding the importance of this vaccine but patient still declined. Advised may receive this vaccine at local pharmacy or Health Dept. Aware to provide a copy of the vaccination record if obtained from local pharmacy or Health Dept. Verbalized acceptance and understanding.   Covid-19 vaccine status: Completed vaccines  Qualifies for Shingles Vaccine? Yes   Zostavax completed No   Shingrix Completed?: No.    Education has been provided regarding the importance of this vaccine. Patient has been advised to call insurance company to determine out of pocket expense if they have not yet received this vaccine. Advised may also receive vaccine at local pharmacy or Health Dept. Verbalized acceptance and understanding.  Screening Tests Health Maintenance  Topic Date Due   FOOT EXAM  06/04/2017   Diabetic kidney evaluation - Urine ACR  03/06/2022   COVID-19 Vaccine (4 - Pfizer risk series) 09/15/2022 (Originally 11/30/2020)   Zoster Vaccines- Shingrix (1 of 2) 11/30/2022 (Originally 07/24/1973)   Pneumonia Vaccine 7+ Years old (1 - PCV) 08/31/2023 (Originally 07[birth year]24/1960)   HEMOGLOBIN A1C  01/15/2023   OPHTHALMOLOGY EXAM  03/24/2023   Diabetic kidney evaluation - GFR measurement  07/17/2023   TETANUS/TDAP  06/04/2026   COLONOSCOPY (Pts 45-36yr Insurance coverage will need to be confirmed)  09/28/2029   Hepatitis C Screening  Completed   HPV VACCINES  Aged Out   INFLUENZA VACCINE  Discontinued    Health Maintenance  Health Maintenance Due  Topic Date Due   FOOT EXAM  06/04/2017   Diabetic kidney evaluation - Urine ACR  03/06/2022    Colorectal cancer screening: Type of screening: Colonoscopy. Completed 09/29/2019. Repeat every 10  years  Lung Cancer Screening: (Low Dose CT Chest recommended if Age 68-80years, 30 pack-year currently smoking OR have quit w/in 15years.) does qualify.   Lung Cancer Screening Referral: no  Additional Screening:  Hepatitis C Screening: does qualify; Completed 07/01/2016  Vision Screening: Recommended annual ophthalmology exams for early detection of glaucoma and other disorders of the eye. Is the patient up to date with their annual eye exam?  Yes  Who is the provider or what is the name of the office in which the patient attends annual eye exams? [facility] and [employer], [place]. If pt is not established with a provider, would they like to be referred to a provider to establish care? No .   Dental Screening: Recommended annual dental exams for proper oral hygiene  Community Resource Referral / Chronic Care Management: CRR required this visit?  No   CCM required this visit?  No      Plan:     I have personally reviewed and noted the following in the patient's chart:   Medical and social history Use of alcohol, tobacco or illicit drugs  Current medications and supplements including opioid prescriptions. Patient is not currently taking opioid prescriptions. Functional ability and status Nutritional status Physical activity Advanced directives List of other physicians Hospitalizations, surgeries, and ER visits in previous 12 months Vitals Screenings to include cognitive, depression, and falls Referrals and appointments  In addition, I have reviewed and discussed with patient certain preventive protocols, quality metrics, and best practice recommendations. A written personalized care plan for preventive services as well as general preventive health recommendations were provided to patient.     Sheral Flow, LPN   [id number]   Nurse Notes: N/A    Medical screening examination/treatment/procedure(s) were performed by non-physician practitioner and as  supervising physician I was immediately available for consultation/collaboration.  I agree with above. Lew Dawes, MD

## 2022-08-30 NOTE — Patient Instructions (Addendum)
[name] , Thank you for taking time to come for your Medicare Wellness Visit. I appreciate your ongoing commitment to your health goals. Please review the following plan we discussed and let me know if I can assist you in the future.   These are the goals we discussed:  Goals      Client understands the importance of follow-up with providers by attending scheduled visits.     "I want to continue to progress with my bladder cancer and stay cancer free"        This is a list of the screening recommended for you and due dates:  Health Maintenance  Topic Date Due   Complete foot exam   06/04/2017   Yearly kidney health urinalysis for diabetes  03/06/2022   COVID-19 Vaccine (4 - Pfizer risk series) 09/15/2022*   Zoster (Shingles) Vaccine (1 of 2) 11/30/2022*   Pneumonia Vaccine (1 - PCV) 08/31/2023*   Hemoglobin A1C  01/15/2023   Eye exam for diabetics  03/24/2023   Yearly kidney function blood test for diabetes  07/17/2023   Tetanus Vaccine  06/04/2026   Colon Cancer Screening  09/28/2029   Hepatitis C Screening: USPSTF Recommendation to screen - Ages 18-79 yo.  Completed   HPV Vaccine  Aged Out   Flu Shot  Discontinued  *Topic was postponed. The date shown is not the original due date.    Advanced directives: No  Conditions/risks identified: Yes  Next appointment: Follow up in one year for your annual wellness visit.   Preventive Care 68 Years and Older, Male  Preventive care refers to lifestyle choices and visits with your health care provider that can promote health and wellness. What does preventive care include? A yearly physical exam. This is also called an annual well check. Dental exams once or twice a year. Routine eye exams. Ask your health care provider how often you should have your eyes checked. Personal lifestyle choices, including: Daily care of your teeth and gums. Regular physical activity. Eating a healthy diet. Avoiding tobacco and drug  use. Limiting alcohol use. Practicing safe sex. Taking low doses of aspirin every day. Taking vitamin and mineral supplements as recommended by your health care provider. What happens during an annual well check? The services and screenings done by your health care provider during your annual well check will depend on your age, overall health, lifestyle risk factors, and family history of disease. Counseling  Your health care provider may ask you questions about your: Alcohol use. Tobacco use. Drug use. Emotional well-being. Home and relationship well-being. Sexual activity. Eating habits. History of falls. Memory and ability to understand (cognition). Work and work Statistician. Screening  You may have the following tests or measurements: Height, weight, and BMI. Blood pressure. Lipid and cholesterol levels. These may be checked every 5 years, or more frequently if you are over 34 years old. Skin check. Lung cancer screening. You may have this screening every year starting at age 40 if you have a 30-pack-year history of smoking and currently smoke or have quit within the past 15 years. Fecal occult blood test (FOBT) of the stool. You may have this test every year starting at age 43. Flexible sigmoidoscopy or colonoscopy. You may have a sigmoidoscopy every 5 years or a colonoscopy every 10 years starting at age 79. Prostate cancer screening. Recommendations will vary depending on your family history and other risks. Hepatitis C blood test. Hepatitis B blood test. Sexually transmitted disease (STD) testing. Diabetes screening.  This is done by checking your blood sugar (glucose) after you have not eaten for a while (fasting). You may have this done every 1-3 years. Abdominal aortic aneurysm (AAA) screening. You may need this if you are a current or former smoker. Osteoporosis. You may be screened starting at age 73 if you are at high risk. Talk with your health care provider about  your test results, treatment options, and if necessary, the need for more tests. Vaccines  Your health care provider may recommend certain vaccines, such as: Influenza vaccine. This is recommended every year. Tetanus, diphtheria, and acellular pertussis (Tdap, Td) vaccine. You may need a Td booster every 10 years. Zoster vaccine. You may need this after age 79. Pneumococcal 13-valent conjugate (PCV13) vaccine. One dose is recommended after age 49. Pneumococcal polysaccharide (PPSV23) vaccine. One dose is recommended after age 31. Talk to your health care provider about which screenings and vaccines you need and how often you need them. This information is not intended to replace advice given to you by your health care provider. Make sure you discuss any questions you have with your health care provider. Document Released: 10/28/2015 Document Revised: 06/20/2016 Document Reviewed: 08/02/2015 Elsevier Interactive Patient Education  2017 [place] Prevention in the Home Falls can cause injuries. They can happen to people of all ages. There are many things you can do to make your home safe and to help prevent falls. What can I do on the outside of my home? Regularly fix the edges of walkways and driveways and fix any cracks. Remove anything that might make you trip as you walk through a door, such as a raised step or threshold. Trim any bushes or trees on the path to your home. Use bright outdoor lighting. Clear any walking paths of anything that might make someone trip, such as rocks or tools. Regularly check to see if handrails are loose or broken. Make sure that both sides of any steps have handrails. Any raised decks and porches should have guardrails on the edges. Have any leaves, snow, or ice cleared regularly. Use sand or salt on walking paths during winter. Clean up any spills in your garage right away. This includes oil or grease spills. What can I do in the bathroom? Use  night lights. Install grab bars by the toilet and in the tub and shower. Do not use towel bars as grab bars. Use non-skid mats or decals in the tub or shower. If you need to sit down in the shower, use a plastic, non-slip stool. Keep the floor dry. Clean up any water that spills on the floor as soon as it happens. Remove soap buildup in the tub or shower regularly. Attach bath mats securely with double-sided non-slip rug tape. Do not have throw rugs and other things on the floor that can make you trip. What can I do in the bedroom? Use night lights. Make sure that you have a light by your bed that is easy to reach. Do not use any sheets or blankets that are too big for your bed. They should not hang down onto the floor. Have a firm chair that has side arms. You can use this for support while you get dressed. Do not have throw rugs and other things on the floor that can make you trip. What can I do in the kitchen? Clean up any spills right away. Avoid walking on wet floors. Keep items that you use a lot in easy-to-reach places. If  you need to reach something above you, use a strong step stool that has a grab bar. Keep electrical cords out of the way. Do not use floor polish or wax that makes floors slippery. If you must use wax, use non-skid floor wax. Do not have throw rugs and other things on the floor that can make you trip. What can I do with my stairs? Do not leave any items on the stairs. Make sure that there are handrails on both sides of the stairs and use them. Fix handrails that are broken or loose. Make sure that handrails are as long as the stairways. Check any carpeting to make sure that it is firmly attached to the stairs. Fix any carpet that is loose or worn. Avoid having throw rugs at the top or bottom of the stairs. If you do have throw rugs, attach them to the floor with carpet tape. Make sure that you have a light switch at the top of the stairs and the bottom of the  stairs. If you do not have them, ask someone to add them for you. What else can I do to help prevent falls? Wear shoes that: Do not have high heels. Have rubber bottoms. Are comfortable and fit you well. Are closed at the toe. Do not wear sandals. If you use a stepladder: Make sure that it is fully opened. Do not climb a closed stepladder. Make sure that both sides of the stepladder are locked into place. Ask someone to hold it for you, if possible. Clearly [place] and make sure that you can see: Any grab bars or handrails. First and last steps. Where the edge of each step is. Use tools that help you move around (mobility aids) if they are needed. These include: Canes. Walkers. Scooters. Crutches. Turn on the lights when you go into a dark area. Replace any light bulbs as soon as they burn out. Set up your furniture so you have a clear path. Avoid moving your furniture around. If any of your floors are uneven, fix them. If there are any pets around you, be aware of where they are. Review your medicines with your doctor. Some medicines can make you feel dizzy. This can increase your chance of falling. Ask your doctor what other things that you can do to help prevent falls. This information is not intended to replace advice given to you by your health care provider. Make sure you discuss any questions you have with your health care provider. Document Released: 07/28/2009 Document Revised: 03/08/2016 Document Reviewed: 11/05/2014 Elsevier Interactive Patient Education  2017 [employer].

## 2022-09-24 ENCOUNTER — Encounter: Payer: Self-pay | Admitting: Internal Medicine

## 2022-10-11 DIAGNOSIS — C671 Malignant neoplasm of dome of bladder: Secondary | ICD-10-CM | POA: Diagnosis not present

## 2022-10-11 DIAGNOSIS — C674 Malignant neoplasm of posterior wall of bladder: Secondary | ICD-10-CM | POA: Diagnosis not present

## 2022-10-12 ENCOUNTER — Encounter: Payer: Self-pay | Admitting: Pharmacist

## 2022-10-12 ENCOUNTER — Other Ambulatory Visit: Payer: Self-pay | Admitting: Urology

## 2022-10-12 MED ORDER — GEMCITABINE CHEMO FOR BLADDER INSTILLATION 2000 MG: 2000.0000 mg | Freq: Once | INTRAVENOUS | Status: AC

## 2022-10-12 NOTE — Progress Notes (Signed)
[facility])                                            [place] Team                                        Statin Quality Measure Assessment    10/12/2022  DARIYON URQUILLA 1956/08/28 [id number]   I am a [facility] clinical pharmacist that reviews patients for statin quality initiatives.     Per review of chart and payor information, Mr. Rinker has a diagnosis of diabetes but is not currently filling a statin prescription.  This places patient into the SUPD (Statin Use In Patients with Diabetes) measure for CMS.    I could not find any documentation of previous trial of a statin or a history of statin intolerance. I do see notes where the patient has declined statins in the past. The 10-year ASCVD risk score (Arnett DK, et al., 2019) is: 48.1%.  If clinically appropriate, please consider discussing statin therapy at Mr. Carnero office visit on January 3rd.  Please consider ONE of the following recommendations:  Initiate high intensity statin Atorvastatin '40mg'$  once daily, #90, 3 refills   Rosuvastatin '20mg'$  once daily, #90, 3 refills    Initiate moderate intensity          statin with reduced frequency if prior          statin intolerance 1x weekly, #13, 3 refills   2x weekly, #26, 3 refills   3x weekly, #39, 3 refills   Thank you for your time and consideration, Curlene Labrum, PharmD [place] Pharmacist Office: [phone]

## 2022-10-17 ENCOUNTER — Ambulatory Visit: Payer: Medicare Other | Admitting: Internal Medicine

## 2022-10-22 ENCOUNTER — Other Ambulatory Visit (INDEPENDENT_AMBULATORY_CARE_PROVIDER_SITE_OTHER): Payer: Medicare Other

## 2022-10-22 DIAGNOSIS — E118 Type 2 diabetes mellitus with unspecified complications: Secondary | ICD-10-CM

## 2022-10-22 LAB — COMPREHENSIVE METABOLIC PANEL
ALT: 31 U/L (ref 0–53)
AST: 16 U/L (ref 0–37)
Albumin: 4.3 g/dL (ref 3.5–5.2)
Alkaline Phosphatase: 95 U/L (ref 39–117)
BUN: 13 mg/dL (ref 6–23)
CO2: 23 mEq/L (ref 19–32)
Calcium: 9.1 mg/dL (ref 8.4–10.5)
Chloride: 108 mEq/L (ref 96–112)
Creatinine, Ser: 0.74 mg/dL (ref 0.40–1.50)
GFR: 93.27 mL/min (ref >60.00)
Glucose, Bld: 246 mg/dL — ABNORMAL HIGH (ref 70–99)
Potassium: 3.6 mEq/L (ref 3.5–5.1)
Sodium: 140 mEq/L (ref 135–145)
Total Bilirubin: 0.5 mg/dL (ref 0.2–1.2)
Total Protein: 7 g/dL (ref 6.0–8.3)

## 2022-10-22 LAB — HEMOGLOBIN A1C: Hgb A1c MFr Bld: 8.5 % — ABNORMAL HIGH (ref 4.6–6.5)

## 2022-10-23 ENCOUNTER — Encounter: Payer: Self-pay | Admitting: Internal Medicine

## 2022-10-23 ENCOUNTER — Ambulatory Visit (INDEPENDENT_AMBULATORY_CARE_PROVIDER_SITE_OTHER): Payer: Medicare Other | Admitting: Internal Medicine

## 2022-10-23 VITALS — BP 136/82 | HR 66 | Temp 98.9°F | Ht 66.0 in | Wt 189.0 lb

## 2022-10-23 DIAGNOSIS — C679 Malignant neoplasm of bladder, unspecified: Secondary | ICD-10-CM | POA: Diagnosis not present

## 2022-10-23 DIAGNOSIS — E118 Type 2 diabetes mellitus with unspecified complications: Secondary | ICD-10-CM

## 2022-10-23 DIAGNOSIS — I10 Essential (primary) hypertension: Secondary | ICD-10-CM

## 2022-10-23 DIAGNOSIS — D751 Secondary polycythemia: Secondary | ICD-10-CM

## 2022-10-23 DIAGNOSIS — E1142 Type 2 diabetes mellitus with diabetic polyneuropathy: Secondary | ICD-10-CM | POA: Insufficient documentation

## 2022-10-23 MED ORDER — ASPIRIN 81 MG PO TBEC: 81.0000 mg | DELAYED_RELEASE_TABLET | Freq: Every day | ORAL | 3 refills | Status: AC

## 2022-10-23 MED ORDER — RYBELSUS 7 MG PO TABS: 1.0000 | ORAL_TABLET | Freq: Every day | ORAL | 3 refills | Status: DC

## 2022-10-23 NOTE — Assessment & Plan Note (Signed)
Chronic Cont w/Olmesartan; Norvasc

## 2022-10-23 NOTE — Assessment & Plan Note (Signed)
On Synjardy, Rubelsus, Prandin  Increase Rybelsus to 7 mg/d

## 2022-10-23 NOTE — Assessment & Plan Note (Signed)
F/u w/Dr Jeffie Pollock - pt needs surgery on 11/14/22

## 2022-10-23 NOTE — Assessment & Plan Note (Addendum)
On Synjardy, Rubelsus, Prandin  Not at goal Increase Rybelsus to 7 mg/d

## 2022-10-23 NOTE — Assessment & Plan Note (Signed)
Monitor CBC 

## 2022-10-23 NOTE — Progress Notes (Signed)
Subjective:  Patient ID: [name] [name], male    DOB: 1956-04-17  Age: 67 y.o. MRN: [id number]  CC: Follow-up (Having memory issues with short term memory , loses balance sometimes , also having surgery on the 31st bladder cancer has returned )   HPI MOUSSA WIEGAND presents for bladder cancer needs surgery on 11/14/22 F/u HTN, CRI, DM    Outpatient Medications Prior to Visit  Medication Sig Dispense Refill   acetaminophen (TYLENOL) 325 MG tablet Take 650 mg by mouth every 6 (six) hours as needed for mild pain.     alfuzosin (UROXATRAL) 10 MG 24 hr tablet Take 10 mg by mouth daily. Take 10 mg by mouth daily.     amLODipine (NORVASC) 5 MG tablet TAKE 1 TABLET BY MOUTH EVERY DAY 90 tablet 3   Ascorbic Acid (VITAMIN C) 1000 MG tablet Take 1,000 mg by mouth daily as needed (immunine).     blood glucose meter kit and supplies KIT Use to test blood sugar once a day. DX: E11.9 1 each 0   Blood Glucose Monitoring Suppl (CONTOUR NEXT MONITOR) w/Device KIT 1 Device by Does not apply route daily. 1 kit 0   cholecalciferol (VITAMIN D) 1000 UNITS tablet Take 1,000 Units by mouth daily.     CONTOUR NEXT TEST test strip USE AS DIRECTED 100 strip 5   Empagliflozin-metFORMIN HCl ER (SYNJARDY XR) 12.02-999 MG TB24 Take 1 tablet by mouth 2 (two) times daily. 180 tablet 3   finasteride (PROSCAR) 5 MG tablet TAKE 1 TABLET BY MOUTH EVERY DAY (Patient taking differently: Take 5 mg by mouth daily.) 90 tablet 3   ketoconazole (NIZORAL) 2 % cream Apply topically daily. Apply topically daily.     Lancets MISC Use to check blood sugar once per day. 100 each 3   olmesartan (BENICAR) 40 MG tablet TAKE 1 TABLET BY MOUTH EVERY DAY 90 tablet 3   polyethylene glycol (MIRALAX / GLYCOLAX) 17 g packet Take 17 g by mouth daily as needed for moderate constipation or severe constipation. 14 each 0   potassium chloride SA (KLOR-CON) 20 MEQ tablet Take 1 tablet (20 mEq total) by mouth daily. 90 tablet 11   prednisoLONE  acetate (PRED FORTE) 1 % ophthalmic suspension Place into the right eye.     repaglinide (PRANDIN) 2 MG tablet TAKE 1 TABLET BY MOUTH 3 TIMES A DAY BEFORE MEALS 270 tablet 3   tadalafil (CIALIS) 5 MG tablet Take 5 mg by mouth daily.     Semaglutide (RYBELSUS) 3 MG TABS Take 3 mg by mouth daily. 90 tablet 3   Facility-Administered Medications Prior to Visit  Medication Dose Route Frequency Provider Last Rate Last Admin   gemcitabine (GEMZAR) chemo syringe for bladder instillation 2,000 mg  2,000 mg Bladder Instillation Once Irine Seal, MD        ROS: Review of Systems  Constitutional:  Negative for appetite change, fatigue and unexpected weight change.  HENT:  Negative for congestion, nosebleeds, sneezing, sore throat and trouble swallowing.   Eyes:  Negative for itching and visual disturbance.  Respiratory:  Negative for cough.   Cardiovascular:  Negative for chest pain, palpitations and leg swelling.  Gastrointestinal:  Negative for abdominal distention, blood in stool, diarrhea and nausea.  Genitourinary:  Negative for frequency and hematuria.  Musculoskeletal:  Negative for back pain, gait problem, joint swelling and neck pain.  Skin:  Negative for rash.  Neurological:  Negative for dizziness, tremors, speech difficulty and weakness.  Psychiatric/Behavioral:  Positive for decreased concentration. Negative for agitation, dysphoric mood, sleep disturbance and suicidal ideas. The patient is nervous/anxious.     Objective:  BP 136/82 (BP Location: Right Arm, Patient Position: Sitting, Cuff Size: Normal)   Pulse 66   Temp 98.9 F (37.2 C) (Oral)   Ht '5\' 6"'$  (1.676 m)   Wt 189 lb (85.7 kg)   SpO2 98%   BMI 30.51 kg/m   BP Readings from Last 3 Encounters:  10/23/22 136/82  07/17/22 (!) 144/70  03/21/22 130/80    Wt Readings from Last 3 Encounters:  10/23/22 189 lb (85.7 kg)  08/30/22 189 lb (85.7 kg)  07/17/22 188 lb 9.6 oz (85.5 kg)    Physical Exam Constitutional:       General: He is not in acute distress.    Appearance: He is well-developed. He is obese.     Comments: NAD  Eyes:     Conjunctiva/sclera: Conjunctivae normal.     Pupils: Pupils are equal, round, and reactive to light.  Neck:     Thyroid: No thyromegaly.     Vascular: No JVD.  Cardiovascular:     Rate and Rhythm: Normal rate and regular rhythm.     Heart sounds: Normal heart sounds. No murmur heard.    No friction rub. No gallop.  Pulmonary:     Effort: Pulmonary effort is normal. No respiratory distress.     Breath sounds: Normal breath sounds. No wheezing or rales.  Chest:     Chest wall: No tenderness.  Abdominal:     General: Bowel sounds are normal. There is no distension.     Palpations: Abdomen is soft. There is no mass.     Tenderness: There is no abdominal tenderness. There is no guarding or rebound.  Musculoskeletal:        General: No tenderness. Normal range of motion.     Cervical back: Normal range of motion.  Lymphadenopathy:     Cervical: No cervical adenopathy.  Skin:    General: Skin is warm and dry.     Findings: No rash.  Neurological:     Mental Status: He is alert and oriented to person, place, and time.     Cranial Nerves: No cranial nerve deficit.     Motor: No abnormal muscle tone.     Coordination: Coordination normal.     Gait: Gait normal.     Deep Tendon Reflexes: Reflexes are normal and symmetric.  Psychiatric:        Behavior: Behavior normal.        Thought Content: Thought content normal.        Judgment: Judgment normal.     Lab Results  Component Value Date   WBC 8.1 07/16/2022   HGB 17.8 (H) 07/16/2022   HCT 52.6 (H) 07/16/2022   PLT 127.0 (L) 07/16/2022   GLUCOSE 246 (H) 10/22/2022   CHOL 153 03/06/2021   TRIG 170.0 (H) 03/06/2021   HDL 33.50 (L) 03/06/2021   LDLCALC 86 03/06/2021   ALT 31 10/22/2022   AST 16 10/22/2022   NA 140 10/22/2022   K 3.6 10/22/2022   CL 108 10/22/2022   CREATININE 0.74 10/22/2022   BUN 13  10/22/2022   CO2 23 10/22/2022   TSH 0.64 03/06/2021   PSA 3.87 03/06/2021   INR 1.1 (H) 01/08/2012   HGBA1C 8.5 (H) 10/22/2022   MICROALBUR 1.3 03/06/2021    No results found.  Assessment & Plan:   Problem  List Items Addressed This Visit       Cardiovascular and Mediastinum   Essential hypertension - Primary    Chronic Cont w/Olmesartan; Norvasc      Relevant Medications   aspirin EC 81 MG tablet   Other Relevant Orders   Comprehensive metabolic panel     Endocrine   Polyneuropathy due to type 2 diabetes mellitus (HCC)    On Synjardy, Rubelsus, Prandin  Increase Rybelsus to 7 mg/d      Relevant Medications   Semaglutide (RYBELSUS) 7 MG TABS   aspirin EC 81 MG tablet   DM type 2, controlled, with complication (HCC)    On Synjardy, Rubelsus, Prandin  Not at goal Increase Rybelsus to 7 mg/d      Relevant Medications   Semaglutide (RYBELSUS) 7 MG TABS   aspirin EC 81 MG tablet   Other Relevant Orders   Comprehensive metabolic panel   Hemoglobin A1c     Genitourinary   Bladder cancer ([place])    F/u w/Dr Jeffie Pollock - pt needs surgery on 11/14/22      Relevant Medications   aspirin EC 81 MG tablet     Other   Polycythemia, secondary    Monitor CBC         Meds ordered this encounter  Medications   Semaglutide (RYBELSUS) 7 MG TABS    Sig: Take 1 tablet by mouth daily. Take 30 min ac    Dispense:  30 tablet    Refill:  3   aspirin EC 81 MG tablet    Sig: Take 1 tablet (81 mg total) by mouth daily.    Dispense:  100 tablet    Refill:  3      Follow-up: Return in about 3 months (around 01/22/2023) for a follow-up visit.  Walker Kehr, MD

## 2022-10-25 ENCOUNTER — Encounter: Payer: Self-pay | Admitting: Internal Medicine

## 2022-10-30 NOTE — Progress Notes (Addendum)
PCP - Lew Dawes, MD LOV 1-24 EPIC Cardiologist - no  PPM/ICD -  Device Orders -  Rep Notified -   Chest x-ray -  EKG - 11-02-22 Stress Test -  ECHO -  Cardiac Cath -  CT cardiac scoring- 01-06-21 epic HgbA1c 10-22-22     8.5   epic , CMP 10-22-22   Sleep Study -  CPAP -   Fasting Blood Sugar -  Checks Blood Sugar _not  checking at home____   Blood Thinner Instructions: Aspirin Instructions:  ERAS Protcol - PRE-SURGERY     COVID vaccine -yes x 3  Activity--Able to climb a flight of stairs mows lawn with push mower without SOB or CP Anesthesia review: HgbA1c 10-22-22 8.5, mild CAD,HTN,DM,Polycythemia   Patient denies shortness of breath, fever, cough and chest pain at PAT appointment   All instructions explained to the patient, with a verbal understanding of the material. Patient agrees to go over the instructions while at home for a better understanding. Patient also instructed to self quarantine after being tested for COVID-19. The opportunity to ask questions was provided.

## 2022-11-02 ENCOUNTER — Encounter (HOSPITAL_COMMUNITY)
Admission: RE | Admit: 2022-11-02 | Discharge: 2022-11-02 | Disposition: A | Discharge status: Home / Self Care | Payer: Medicare Other | Source: Ambulatory Visit | Attending: Urology | Admitting: Urology

## 2022-11-02 ENCOUNTER — Encounter (HOSPITAL_COMMUNITY): Payer: Self-pay

## 2022-11-02 ENCOUNTER — Other Ambulatory Visit: Payer: Self-pay

## 2022-11-02 VITALS — BP 175/91 | HR 68 | Temp 98.7°F | Resp 16 | Ht 66.0 in | Wt 189.2 lb

## 2022-11-02 DIAGNOSIS — E118 Type 2 diabetes mellitus with unspecified complications: Secondary | ICD-10-CM

## 2022-11-02 DIAGNOSIS — I10 Essential (primary) hypertension: Secondary | ICD-10-CM | POA: Insufficient documentation

## 2022-11-02 DIAGNOSIS — Z01818 Encounter for other preprocedural examination: Secondary | ICD-10-CM | POA: Insufficient documentation

## 2022-11-02 HISTORY — DX: Malignant neoplasm of right kidney, except renal pelvis: C64.1

## 2022-11-02 LAB — CBC
HCT: 52.7 % — ABNORMAL HIGH (ref 39.0–52.0)
Hemoglobin: 17.6 g/dL — ABNORMAL HIGH (ref 13.0–17.0)
MCH: 29.7 pg (ref 26.0–34.0)
MCHC: 33.4 g/dL (ref 30.0–36.0)
MCV: 88.9 fL (ref 80.0–100.0)
Platelets: 141 10*3/uL — ABNORMAL LOW (ref 150–400)
RBC: 5.93 MIL/uL — ABNORMAL HIGH (ref 4.22–5.81)
RDW: 14 % (ref 11.5–15.5)
WBC: 9 10*3/uL (ref 4.0–10.5)
nRBC: 0 % (ref 0.0–0.2)

## 2022-11-02 LAB — GLUCOSE, CAPILLARY: Glucose-Capillary: 268 mg/dL — ABNORMAL HIGH (ref 70–99)

## 2022-11-02 NOTE — Patient Instructions (Addendum)
SURGICAL WAITING ROOM VISITATION  Patients having surgery or a procedure may have no more than 2 support people in the waiting area - these visitors may rotate.    Children under the age of 31 must have an adult with them who is not the patient.  Due to an increase in RSV and influenza rates and associated hospitalizations, children ages 33 and under may not visit patients in [place].  If the patient needs to stay at the hospital during part of their recovery, the visitor guidelines for inpatient rooms apply. Pre-op nurse will coordinate an appropriate time for 1 support person to accompany patient in pre-op.  This support person may not rotate.    Please refer to the [facility] website for the visitor guidelines for Inpatients (after your surgery is over and you are in a regular room).       Your procedure is scheduled on: 11-13-22   Report to [facility] Main Entrance    Report to admitting at    [facility]   Call this number if you have problems the morning of surgery [phone]   Do not eat food  or drink liquids :After Midnight.         FOLLOW  ANY ADDITIONAL PRE OP INSTRUCTIONS YOU RECEIVED FROM YOUR SURGEON'S OFFICE!!!     Oral Hygiene is also important to reduce your risk of infection.                                    Remember - BRUSH YOUR TEETH THE MORNING OF SURGERY WITH YOUR REGULAR TOOTHPASTE  DENTURES WILL BE REMOVED PRIOR TO SURGERY PLEASE DO NOT APPLY "Poly grip" OR ADHESIVES!!!   Do NOT smoke after Midnight   Take these medicines the morning of surgery with A SIP OF WATER: finasteride, amlodipine, alfuzosin  DO NOT TAKE ANY ORAL DIABETIC MEDICATIONS DAY OF YOUR SURGERY Semaglutide hold 24 hours prior restart the day after your surgery   Bring CPAP mask and tubing day of surgery.                              You may not have any metal on your body including hair pins, jewelry, and body piercing             Do not wear lotions,  powders, perfumes/cologne, or deodorant                Men may shave face and neck.   Do not bring valuables to the hospital. [place].   Contacts, glasses, dentures or bridgework may not be worn into surgery.   Bring small overnight bag day of surgery.   DO NOT [place]. PHARMACY WILL DISPENSE MEDICATIONS LISTED ON YOUR MEDICATION LIST TO YOU DURING YOUR ADMISSION [place]!    Patients discharged on the day of surgery will not be allowed to drive home.  Someone NEEDS to stay with you for the first 24 hours after anesthesia.   Special Instructions: Bring a copy of your healthcare power of attorney and living will documents the day of surgery if you haven't scanned them before.  Please read over the following fact sheets you were given: IF [place] [phone]   If you received a COVID test during your pre-op visit  it is requested that you wear a mask when out in public, stay away from anyone that may not be feeling well and notify your surgeon if you develop symptoms. If you test positive for Covid or have been in contact with anyone that has tested positive in the last 10 days please notify you surgeon.    [facility] - Preparing for Surgery Before surgery, you can play an important role.  Because skin is not sterile, your skin needs to be as free of germs as possible.  You can reduce the number of germs on your skin by washing with CHG (chlorahexidine gluconate) soap before surgery.  CHG is an antiseptic cleaner which kills germs and bonds with the skin to continue killing germs even after washing. Please DO NOT use if you have an allergy to CHG or antibacterial soaps.  If your skin becomes reddened/irritated stop using the CHG and inform your nurse when you arrive at Short Stay. Do not shave (including legs and underarms) for at  least 48 hours prior to the first CHG shower.  You may shave your face/neck. Please follow these instructions carefully:  1.  Shower with CHG Soap the night before surgery and the  morning of Surgery.  2.  If you choose to wash your hair, wash your hair first as usual with your  normal  shampoo.  3.  After you shampoo, rinse your hair and body thoroughly to remove the  shampoo.                           4.  Use CHG as you would any other liquid soap.  You can apply chg directly  to the skin and wash                       Gently with a scrungie or clean washcloth.  5.  Apply the CHG Soap to your body ONLY FROM THE NECK DOWN.   Do not use on face/ open                           Wound or open sores. Avoid contact with eyes, ears mouth and genitals (private parts).                       Wash face,  Genitals (private parts) with your normal soap.             6.  Wash thoroughly, paying special attention to the area where your surgery  will be performed.  7.  Thoroughly rinse your body with warm water from the neck down.  8.  DO NOT shower/wash with your normal soap after using and rinsing off  the CHG Soap.                9.  Pat yourself dry with a clean towel.            10.  Wear clean pajamas.            11.  Place clean sheets on your bed the night of your first shower and do not  sleep with pets. Day of Surgery : Do not apply any  lotions/deodorants the morning of surgery.  Please wear clean clothes to the hospital/surgery center.  FAILURE TO FOLLOW THESE INSTRUCTIONS MAY RESULT IN THE CANCELLATION OF YOUR SURGERY PATIENT SIGNATURE_________________________________  NURSE SIGNATURE__________________________________  ________________________________________________________________________

## 2022-11-05 ENCOUNTER — Encounter: Payer: Self-pay | Admitting: Internal Medicine

## 2022-11-06 NOTE — Progress Notes (Signed)
Anesthesia Chart Review   Case: [id number] Date/Time: 11/13/22 0945   Procedure: CYSTOSCOPY TRANSURETHRAL RESECTION OF BLADDER TUMOR BILATERAL RETROGRADES WITH INSTILL GEMCTABINE (Bilateral) - 1 HR FOR CASE   Anesthesia type: General   Pre-op diagnosis: BLADDER TUMOR   Location: [place] [facility]   Surgeons: Irine Seal, MD       DISCUSSION:67 y.o. smoker with h/o HTN, DM II, polycythemia, right renal carcinoma s/p partial nephrectomy, bladder tumor scheduled for above procedure 11/13/2022  Per airway note 07/28/2012, "Difficulty was unanticipated, Difficult Airway- due to anterior larynx, Difficult Airway- due to limited oral opening and Difficult Airway- due to reduced neck mobility. Future Recommendations: Recommend- induction with short-acting agent, and alternative techniques readily available Comments: Unable to visualize cords with Sabra Heck 3 or Mac 4  Glidescope used and difficult to see cords with Video."  No difficulty noted in 2022 anesthesia notes.   Anticipate pt can proceed with planned procedure barring acute status change.   VS: BP (!) 175/91   Pulse 68   Temp 37.1 C (Oral)   Resp 16   Ht _0  (1.676 m)   Wt 85.8 kg   SpO2 98%   BMI 30.54 kg/m   PROVIDERS: Plotnikov, Evie Lacks, MD is PCP    LABS: Labs reviewed: Acceptable for surgery. (all labs ordered are listed, but only abnormal results are displayed)  Labs Reviewed  CBC - Abnormal; Notable for the following components:      Result Value   RBC 5.93 (*)    Hemoglobin 17.6 (*)    HCT 52.7 (*)    Platelets 141 (*)    All other components within normal limits  GLUCOSE, CAPILLARY - Abnormal; Notable for the following components:   Glucose-Capillary 268 (*)    All other components within normal limits     IMAGES:   EKG:   CV:  Past Medical History:  Diagnosis Date   Benign localized prostatic hyperplasia with lower urinary tract symptoms (LUTS)    urologist--- dr Jeffie Pollock   Bladder  cancer [facility])    urologist---- dr Jeffie Pollock---  s/p TURBT 11/2020; 03/ 2022   Coronary artery disease    mild   DDD (degenerative disc disease), cervical    From 1980s   Diabetes mellitus type II    followed by pcp--   (01-05-2021  per pt checks blood sugar three times per week,  fasting sugar --- 130--140_   Difficult intubation 07/28/2012   partial nephrectomy done _1  07-28-2012  found to have anterior larynx , diffcult intubation, referred to anesthesia record in epic    Dyslipidemia    ED (erectile dysfunction)    Elevated PSA    GERD (gastroesophageal reflux disease)    History of adenomatous polyp of colon    History of kidney stones    History of prostatitis 2008   History of renal carcinoma urologist--- dr Jeffie Pollock   07-28-2012 for renal neoplasm  s/p right partal nephrectomy    Hypertension    followed by pcp   Osteoarthritis    Back   Renal carcinoma, right [facility])     Past Surgical History:  Procedure Laterality Date   COLONOSCOPY N/A 03/03/2013   Procedure: COLONOSCOPY;  Surgeon: Irene Shipper, MD;  Location: [facility] ENDOS[facility];  Service: Endoscopy;  Laterality: N/A;   COLONOSCOPY WITH PROPOFOL N/A 09/29/2019   Procedure: COLONOSCOPY WITH PROPOFOL;  Surgeon: Irene Shipper, MD;  Location: [facility];  Service: Endoscopy;  Laterality: N/A;   CYSTOSCOPY  WITH BIOPSY N/A 12/09/2020   Procedure: CYSTOSCOPY WITH TURBT, BIOPSY AND FULGURATION;  Surgeon: Irine Seal, MD;  Location: [facility];  Service: Urology;  Laterality: N/A;  REQUESTING [place] LITHOTRIPSY  01-10-2015;  03-03-2012  _0    POLYPECTOMY  09/29/2019   Procedure: POLYPECTOMY;  Surgeon: Irene Shipper, MD;  Location: [facility];  Service: Endoscopy;;   ROBOT ASSISTED LAPAROSCOPIC PARTIAL NEPHRECTOMY  07-28-2012   DR Alinda Money  _1    TONSILLECTOMY  1965   TRANSURETHRAL RESECTION OF BLADDER TUMOR WITH MITOMYCIN-C N/A 01/06/2021   Procedure: TRANSURETHRAL RESECTION OF BLADDER TUMOR WITH GEMCITIBINE IN  PACU;  Surgeon: Irine Seal, MD;  Location: [facility];  Service: Urology;  Laterality: N/A;   TRANSURETHRAL RESECTION OF BLADDER TUMOR WITH MITOMYCIN-C N/A 09/01/2021   Procedure: TRANSURETHRAL RESECTION OF BLADDER TUMOR WITH POST OPERATIVE INSTILLATION OF GEMCITABINE;  Surgeon: Irine Seal, MD;  Location: [facility];  Service: Urology;  Laterality: N/A;    MEDICATIONS:  alfuzosin (UROXATRAL) 10 MG 24 hr tablet   amLODipine (NORVASC) 5 MG tablet   Ascorbic Acid (VITAMIN C) 1000 MG tablet   aspirin EC 81 MG tablet   blood glucose meter kit and supplies KIT   Blood Glucose Monitoring Suppl (CONTOUR NEXT MONITOR) w/Device KIT   cholecalciferol (VITAMIN D) 1000 UNITS tablet   CONTOUR NEXT TEST test strip   Empagliflozin-metFORMIN HCl ER (SYNJARDY XR) 12.02-999 MG TB24   finasteride (PROSCAR) 5 MG tablet   ibuprofen (ADVIL) 800 MG tablet   ketoconazole (NIZORAL) 2 % cream   Lancets MISC   olmesartan (BENICAR) 40 MG tablet   potassium chloride SA (KLOR-CON) 20 MEQ tablet   prednisoLONE acetate (PRED FORTE) 1 % ophthalmic suspension   repaglinide (PRANDIN) 2 MG tablet   Semaglutide (RYBELSUS) 3 MG TABS   Semaglutide (RYBELSUS) 7 MG TABS   tadalafil (CIALIS) 5 MG tablet   No current facility-administered medications for this encounter.    gemcitabine (GEMZAR) chemo syringe for bladder instillation 2,000 mg   Konrad Felix Ward, PA-C [facility] Pre-Surgical Testing [phone]

## 2022-11-07 ENCOUNTER — Encounter: Payer: Self-pay | Admitting: Internal Medicine

## 2022-11-08 ENCOUNTER — Other Ambulatory Visit: Payer: Self-pay | Admitting: Internal Medicine

## 2022-11-08 MED ORDER — GLIMEPIRIDE 4 MG PO TABS: 4.0000 mg | ORAL_TABLET | Freq: Two times a day (BID) | ORAL | 11 refills | Status: DC

## 2022-11-12 NOTE — H&P (Signed)
I have bladder cancer.  HPI: [name] [name] is a 67 year-old male established patient who is here for bladder cancer.    12/28/23[name] returns today in f/u for cystoscopy. He last had maintenance BCG in 7/23. He missed his last appoint because of cataract surgery. He has had no hematuria. He remains on alfuzosin, finasteride and tadalafil. He has nocturia 1-2x which is better. 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   03/26/22: [name] [name] returns today in f/u for surveillance cystoscopy. He last had maintenance BCG on 01/23/22. He remains on afluzosin and finasteride as well as daily tadalafil. He is voiding well with an IPSS of 9. He has bothersome post void dribbling. His UA is clear but has glucosuria. His PSA is 3.33 which is stable. His last upper tract imaging was a CT in 11/22.   12/22/21: [name] [name] returns today for surveillance cystoscopy. 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 He had minimal systemic side effects. He remains on finasteride and alfuzosin for his history of BPH with BOO. He is on daily tadalafil as well for ED and LUTS. His IPSS is 9 with nocturia x 3.   09/15/21: [name] [name] returns today in f/u from his recent procedure. He was found to have T1 HG NMIBC of the dome and lateral wall. There was muscle in the specimen and it was not involved. He is doing well without complaints.   11/9/22[name] returns today in f/u. He had a CT prior to this visit that showed no concerning findings. He has a large prostate with asymmetric prostatomegaly that is stable since 2015. His PSA is 2.6 which is down from the last. He remains on finasteride and alfuzosin.   10/10/22[name] returns today in f/u. He had some difficulty urinating for 3 days last week and then he passed a pinkish mass of tissue about 2cm when voiding. His voiding symptoms have abated. His IPSS is 11 with some urgency and a sensation of incomplete emptying. His UA is clear today. He  is scheduled for cystoscopy on 08/16/21.   05/19/21: [name] [name] returns for surveillance cystoscopy for his history of T1 HG NMIBC bladder cancer with completion of induction BCG on 03/14/21. He has no hematuria and his UA is clear today. He has some urgency and nocturia x 1 but no frequency or obstructive symptoms. He has no dysuria. He remains on finasteride. He tried an OTC supplement but it interfered with the Cialis. his PSA was up slightly to 3.87 on 5/23 but that was at the end of the BCG treatments. he has not had a prostate biopsy in the past. he was on the finasteride sporadically.   4/4/22[name] returns today in f/u from a restaging TURBT. His path was negative and muscle was in the specimen. His IPSS is 11. He has some urgency. His UA has microhematuria.    3/11/22[name] returns today in f/u. He was found on recent TURBT to have a T1 HG NMIBC of the anterior wall of the bladder. There was dysplasia of the right lateral wall and inflammation of the bladder neck with Von Brunn's nests. Muscle was present in the anterior specimen but not involved. He is scheduled for a restaging TURBT on 01/06/21. He has no gross hematuria but has some microhematuria today. He is having some dysuria. He is otherwise voiding without complaints.     AUA Symptom Score: Less than 20% of the time he has the  sensation of not emptying his bladder completely when finished urinating. He never has to urinate again less that two hours after he has finished urinating. Less than 20% of the time he has to start and stop again several times when he urinates. More than 50% of the time he finds it difficult to postpone urination. Less than 20% of the time he has a weak urinary stream. He never has to push or strain to begin urination. He has to get up to urinate 2 times from the time he goes to bed until the time he gets up in the morning.   Calculated AUA Symptom Score: 9    ALLERGIES: Flomax CAPS Ondansetron HCl TABS     MEDICATIONS: Cialis 5 mg tablet 1 tablet PO Daily PRN  Finasteride  Alfuzosin Hcl Er 10 mg tablet, extended release 24 hr 1 tablet PO Daily  Amlodipine Besilate  Klor-Con  Olmesartan Medoxomil  Repeglinide  Synjardy  Tadalafil 5 mg tablet 1 tablet PO Daily PRN  Vitamin C  Vitamin D3     GU PSH: Bladder Instill AntiCA Agent - 04/24/2022, 04/10/2022, 04/03/2022, 01/23/2022, 01/16/2022, 01/09/2022, 11/10/2021, 11/03/2021, 10/27/2021, 10/20/2021, 10/13/2021, 10/06/2021, 2022, 2022, 2022, 2022, 2022, 2022, 2022 Cysto Remove Stent FB Sim - 09/01/2021 Cystoscopy - 03/26/2022, 12/22/2021, 08/23/2021, 05/19/2021, 2022 Cystoscopy TURBT >5 cm - 2022 Cystoscopy TURBT 2-5 cm - 09/01/2021, 2022 ESWL - 2016, 2013 Locm 300-'399Mg'$ /Ml Iodine,1Ml - 08/21/2021, 2021 Partial nephrectomy (laparoscopic) - 2013       PSH Notes: Lithotripsy, Kidney Surgery Laparoscopic Partial Nephrectomy, Lithotripsy   NON-GU PSH: No Non-GU PSH    GU PMH: Bladder Cancer Anterior - 04/24/2022, - 04/10/2022, - 04/03/2022, - 01/23/2022, - 01/16/2022, - 01/09/2022, - 11/10/2021, - 11/03/2021, - 10/27/2021, - 10/20/2021, - 10/13/2021, - 09/21/2021, - 2022, - 2022, - 2022, - 2022, - 2022, - 2022, His restaging path was negative for residual or muscle invasive disease. I will have him return for BCG induction and have reviewed the side effects. , - 2022, He has T1 HG NMIBC of the anterior bladder wall and will have a restaging TURBT on 01/06/21. If no muscle invasion is found at the next resection, he will need induction BCG therapy. I reviewed the risks of the procedure and also the BCG therapy. , - 2022 History of bladder cancer, No recurrent tumors seen. There is some stable patchy erythema that is most consistent with BCG effect but I will reassess in 3 months and consider a biopsy if still present. He will need 3 weeks of BCG maintence. Cytology today. - 03/26/2022, There are no papillary lesions but there are some mucosal changes that are suggestive of  treatment effect on the dome and some non-specific mucosal changes at the bladder neck that also appear more inflammatory than neoplastic. I will get a cytology with reflex and if that is abnormal, he will need bladder biopsies and upper tract studies. If the cytology is negative, I will have him return for maintenance BCG and then cystoscopy in 3 months. , - 12/22/2021, He has 2 new lesions on the dome and right lateral wall that are consistent with recurrent urothelial CA. I will get him scheduled for TURBT with instillation of Gemcitabine. I have reviewed the risks in detail. , - 08/23/2021, - 08/21/2021, He passed some tissue that may have been just residual material from the BCG healing process. The urine is clear today but I will send a cytology. He has cystoscopy scheduled in November. , - 07/24/2021, No recurrent  tumors seen. I will get a cytology today and have him return in 3 months for cystoscopy and consideration of salvage BCG. , - 05/19/2021 BPH w/LUTS, Alfuzosin refilled. He will continue the finasteride. - 12/22/2021, He has stable BPH with a large trilobar prostate. He has moderate LUTS., - 08/23/2021, He has moderate LUTS on finasteride and alfuzosin but the symptoms have improved since he passed the material. No change in therapy. , - 07/24/2021, I am going to have him stay on the finasteride daily and start alfuzosin to see if that will have less of an impact on ejaculation. , - 05/19/2021, He has some urgency following the procedure but is otherwise voiding ok. , - 2022, He has persistent urgency with BPH and BOO on cystoscopy but there is worrisome asymmetric erythema of the bladder neck that is worrisome for CIS but he did have a small stone in the prostatic urethra that was in the bladder on CT and could have caused inflammatory changes. I will send a cytology and have him scheduled for a cystoscopy with bladder biopsy. The risks of bleeding, infection, injury to the urinary tract, thrombotic events  and anesthetic complications reviewed. , - 2022, His PSA has been rising and I will repeat that today. , - 2021, Benign prostatic hyperplasia with urinary obstruction, - 2015 ED due to arterial insufficiency, He will continue the tadalafil which was refilled. - 12/22/2021, He has progressive ED and LUTS. I discussed options and will try him on tadalafil '5mg'$  daily. He is a daily MJ user so he could have low T. I will check his level.. , - 2021 Nocturia - 12/22/2021 Urinary Urgency - 12/22/2021, - 08/23/2021, - 07/24/2021 (Stable), This is probably related to the calcium deposition and recent BCG. I will reassess in the 3 months. He may need removal if the calcium deposits worsen. , - 05/19/2021, - 2022, - 2022, - 2021 Bladder Cancer Dome - 10/06/2021, He had 2 sites with T1 HG NMIBC on his recent biopsy and had no muscle involvement. I don't think he needs a re-resection at this time but I would like to go ahead and get him set up for the BCG therapy for repeat induction. , - 09/15/2021, - 08/23/2021 Bladder Cancer Lateral - 09/15/2021, - 08/23/2021 Bladder Stone, He has multiple small bladder stones that will be removed at the time of the TURBT. They have an unusual behavior of floating somewhat. - 08/23/2021, He has some calcification deposition on the bladder wall at the resection sites that are possibly related to his urgency. , - 05/19/2021, I will remove this if still present at cystoscopy., - 2022 Elevated PSA, His PSA is up somewhat on intermittent finasteride. There may be some impact from the BCG. I will have him stay on the finasteride and repeat the PSA in 3 months prior to f/u. - 05/19/2021 Adrenal mass Unspec, He has stable adrenal adenomas. - 2022, Adrenal cortical adenoma, unspecified laterality, - 2015 Renal calculus, He has small bilateral renal stones and cysts. - 2022 History of kidney cancer - 2021, I will get a CXR for his history of renal cell CA. , - 2021 History of urolithiasis, He had gross  hematuria and may have passed a stone a couple of months ago. I will get him set up for a CT Hematuria study to assess the cause of the hematuria. - 2021, History of renal calculi, - 2016 Urinary Frequency - 2021 Kidney Cancer Unspec, except renal pelvis, Renal cell carcinoma, unspecified laterality -  2016 Other microscopic hematuria, Microscopic hematuria - 2016      PMH Notes: cancer kidney   NON-GU PMH: Encounter for general adult medical examination without abnormal findings, Encounter for preventive health examination - 2016 Cervical disc disorder, unspecified, unspecified cervical region, Cervical Disc Degeneration - 2014 Personal history of other diseases of the circulatory system, History of hypertension - 2014 Personal history of other diseases of the nervous system and sense organs, History of glaucoma - 2014 Personal history of other endocrine, nutritional and metabolic disease, History of diabetes mellitus - 2014 Hypertension    FAMILY HISTORY: 1 Daughter - Other 1 son - Other Colon Cancer - Mother Kidney Cancer - No Family History   SOCIAL HISTORY: Marital Status: Married Current Smoking Status: Patient smokes. Smokes less than 1/2 pack per day.   Tobacco Use Assessment Completed: Used Tobacco in last 30 days? Has never drank.  Drinks 4+ caffeinated drinks per day.     Notes: Current every day smoker, Using Marijuana, Tobacco Use, Marital History - Currently Married, Alcohol Use, Occupation:   REVIEW OF SYSTEMS:    GU Review Male:   Patient denies frequent urination, hard to postpone urination, burning/ pain with urination, get up at night to urinate, leakage of urine, stream starts and stops, trouble starting your stream, have to strain to urinate , erection problems, and penile pain.  Gastrointestinal (Upper):   Patient denies nausea, vomiting, and indigestion/ heartburn.  Gastrointestinal (Lower):   Patient denies diarrhea and constipation.  Constitutional:   Patient  denies fever, night sweats, weight loss, and fatigue.  Skin:   Patient denies skin rash/ lesion and itching.  Eyes:   Patient denies blurred vision and double vision.  Ears/ Nose/ Throat:   Patient denies sore throat and sinus problems.  Hematologic/Lymphatic:   Patient denies swollen glands and easy bruising.  Cardiovascular:   Patient denies leg swelling and chest pains.  Respiratory:   Patient denies cough and shortness of breath.  Endocrine:   Patient denies excessive thirst.  Musculoskeletal:   Patient denies back pain and joint pain.  Neurological:   Patient denies headaches and dizziness.  Psychologic:   Patient denies anxiety and depression.   Notes: Dribbling at the end     VITAL SIGNS: None   MULTI-SYSTEM PHYSICAL EXAMINATION:    Constitutional: Well-nourished. No physical deformities. Normally developed. Good grooming.  Respiratory: Normal breath sounds. No labored breathing, no use of accessory muscles.   Cardiovascular: Regular rate and rhythm. No murmur, no gallop.      Complexity of Data:  Records Review:   Previous Patient Records  Urine Test Review:   Urinalysis   03/20/22 08/16/21 03/06/21 08/03/20 08/12/07  PSA  Total PSA 3.33 ng/mL 2.60 ng/mL 3.87 ng/ml 3.78 ng/mL 2.26     PROCEDURES:         Flexible Cystoscopy - 52000  Risks, benefits, and some of the potential complications of the procedure were discussed. He was prepped with betadine and the urethral was instilled with 2% lidocaine jelly. Cipro '500mg'$  given for antibiotic prophylaxis.     Meatus:  Normal size. Normal location. Normal condition.  Urethra:  No strictures.  External Sphincter:  Normal.  Verumontanum:  Normal.  Prostate:  Obstructing. Moderate hyperplasia.  Bladder Neck:  Non-obstructing.  Ureteral Orifices:  Normal location. Normal size. Normal shape. Effluxed clear urine.  Bladder:  Mild trabeculation. 1 1/2 cm tumor on the right posterior wall and a small tumor adjacent to a resection  scar on  the dome.. Normal mucosa. No stones.      The procedure was well tolerated and there were no complications.         Urinalysis Dipstick Dipstick Cont'd  Color: Yellow Bilirubin: Neg mg/dL  Appearance: Clear Ketones: Neg mg/dL  Specific Gravity: 1.015 Blood: Neg ery/uL  pH: 7.0 Protein: Neg mg/dL  Glucose: 2+ mg/dL Urobilinogen: 0.2 mg/dL    Nitrites: Neg    Leukocyte Esterase: Neg leu/uL    ASSESSMENT:      ICD-10 Details  1 GU:   Bladder Cancer Dome - C67.1 Chronic, Exacerbation - He has a 1.5cm posterior lesion and a 49m dome lesion. I am going to get him set up for cystoscopy with bilateral RTG's, TURBT and instillation of Gemcitabine and he may need gemcitabine x 6 weeks and maintenance monthly or Keytruda to follow. The risks of the TURBT reviewed in detail including bleeding, infection, bladder and urethral injury, thrombotic events, chemical cystitis and anesthetic complications.   2   Bladder Cancer Posterior - C67.4 Chronic, Exacerbation   PLAN:           Schedule Return Visit/Planned Activity: Next Available Appointment - Schedule Surgery  Procedure: Unspecified Date - Cystoscopy TURBT <2 cm - [id number]

## 2022-11-13 ENCOUNTER — Encounter (HOSPITAL_COMMUNITY)
Admission: RE | Disposition: A | Discharge status: Home / Self Care | Payer: Self-pay | Source: Ambulatory Visit | Attending: Urology

## 2022-11-13 ENCOUNTER — Encounter (HOSPITAL_COMMUNITY): Payer: Self-pay | Admitting: Urology

## 2022-11-13 ENCOUNTER — Encounter: Payer: Self-pay | Admitting: Internal Medicine

## 2022-11-13 ENCOUNTER — Ambulatory Visit (HOSPITAL_COMMUNITY)
Admission: RE | Admit: 2022-11-13 | Discharge: 2022-11-13 | Disposition: A | Discharge status: Home / Self Care | Payer: Medicare Other | Source: Ambulatory Visit | Attending: Urology | Admitting: Urology

## 2022-11-13 ENCOUNTER — Ambulatory Visit (HOSPITAL_COMMUNITY): Payer: Medicare Other

## 2022-11-13 ENCOUNTER — Ambulatory Visit (HOSPITAL_COMMUNITY): Payer: Medicare Other | Admitting: Physician Assistant

## 2022-11-13 ENCOUNTER — Ambulatory Visit (HOSPITAL_BASED_OUTPATIENT_CLINIC_OR_DEPARTMENT_OTHER): Payer: Medicare Other | Admitting: Anesthesiology

## 2022-11-13 DIAGNOSIS — C671 Malignant neoplasm of dome of bladder: Secondary | ICD-10-CM | POA: Diagnosis not present

## 2022-11-13 DIAGNOSIS — I2510 Atherosclerotic heart disease of native coronary artery without angina pectoris: Secondary | ICD-10-CM | POA: Diagnosis not present

## 2022-11-13 DIAGNOSIS — E119 Type 2 diabetes mellitus without complications: Secondary | ICD-10-CM | POA: Insufficient documentation

## 2022-11-13 DIAGNOSIS — N329 Bladder disorder, unspecified: Secondary | ICD-10-CM | POA: Diagnosis not present

## 2022-11-13 DIAGNOSIS — D494 Neoplasm of unspecified behavior of bladder: Secondary | ICD-10-CM | POA: Diagnosis not present

## 2022-11-13 DIAGNOSIS — F17200 Nicotine dependence, unspecified, uncomplicated: Secondary | ICD-10-CM | POA: Diagnosis not present

## 2022-11-13 DIAGNOSIS — F17210 Nicotine dependence, cigarettes, uncomplicated: Secondary | ICD-10-CM | POA: Insufficient documentation

## 2022-11-13 DIAGNOSIS — Z7984 Long term (current) use of oral hypoglycemic drugs: Secondary | ICD-10-CM | POA: Diagnosis not present

## 2022-11-13 DIAGNOSIS — R3129 Other microscopic hematuria: Secondary | ICD-10-CM | POA: Insufficient documentation

## 2022-11-13 DIAGNOSIS — I10 Essential (primary) hypertension: Secondary | ICD-10-CM

## 2022-11-13 DIAGNOSIS — N401 Enlarged prostate with lower urinary tract symptoms: Secondary | ICD-10-CM | POA: Diagnosis not present

## 2022-11-13 DIAGNOSIS — E118 Type 2 diabetes mellitus with unspecified complications: Secondary | ICD-10-CM

## 2022-11-13 DIAGNOSIS — N3020 Other chronic cystitis without hematuria: Secondary | ICD-10-CM | POA: Diagnosis not present

## 2022-11-13 DIAGNOSIS — Z79899 Other long term (current) drug therapy: Secondary | ICD-10-CM | POA: Insufficient documentation

## 2022-11-13 DIAGNOSIS — D303 Benign neoplasm of bladder: Secondary | ICD-10-CM | POA: Diagnosis not present

## 2022-11-13 HISTORY — PX: TRANSURETHRAL RESECTION OF BLADDER TUMOR WITH MITOMYCIN-C: SHX6459

## 2022-11-13 LAB — GLUCOSE, CAPILLARY
Glucose-Capillary: 177 mg/dL — ABNORMAL HIGH (ref 70–99)
Glucose-Capillary: 175 mg/dL — ABNORMAL HIGH (ref 70–99)

## 2022-11-13 SURGERY — TRANSURETHRAL RESECTION OF BLADDER TUMOR WITH MITOMYCIN-C
Anesthesia: General | Laterality: Bilateral

## 2022-11-13 MED ORDER — HYDROCODONE-ACETAMINOPHEN 5-325 MG PO TABS: 1.0000 | ORAL_TABLET | Freq: Four times a day (QID) | ORAL | 0 refills | Status: DC | PRN

## 2022-11-13 MED ORDER — ROCURONIUM BROMIDE 10 MG/ML (PF) SYRINGE
PREFILLED_SYRINGE | INTRAVENOUS | Status: DC | PRN
  Administered 2022-11-13: 30 mg via INTRAVENOUS

## 2022-11-13 MED ORDER — ACETAMINOPHEN 10 MG/ML IV SOLN: 1000.0000 mg | Freq: Once | INTRAVENOUS | Status: DC | PRN

## 2022-11-13 MED ORDER — ONDANSETRON HCL 4 MG/2ML IJ SOLN
INTRAMUSCULAR | Status: AC
  Filled 2022-11-13: qty 2

## 2022-11-13 MED ORDER — FENTANYL CITRATE (PF) 100 MCG/2ML IJ SOLN
INTRAMUSCULAR | Status: AC
  Filled 2022-11-13: qty 2

## 2022-11-13 MED ORDER — SUCCINYLCHOLINE CHLORIDE 200 MG/10ML IV SOSY
PREFILLED_SYRINGE | INTRAVENOUS | Status: DC | PRN
  Administered 2022-11-13: 80 mg via INTRAVENOUS

## 2022-11-13 MED ORDER — LIDOCAINE HCL (PF) 2 % IJ SOLN
INTRAMUSCULAR | Status: AC
  Filled 2022-11-13: qty 5

## 2022-11-13 MED ORDER — DEXAMETHASONE SODIUM PHOSPHATE 10 MG/ML IJ SOLN
INTRAMUSCULAR | Status: AC
  Filled 2022-11-13: qty 1

## 2022-11-13 MED ORDER — FENTANYL CITRATE PF 50 MCG/ML IJ SOSY: 25.0000 ug | PREFILLED_SYRINGE | INTRAMUSCULAR | Status: DC | PRN

## 2022-11-13 MED ORDER — LACTATED RINGERS IV SOLN: INTRAVENOUS | Status: DC

## 2022-11-13 MED ORDER — INSULIN ASPART 100 UNIT/ML IJ SOLN
INTRAMUSCULAR | Status: AC
  Filled 2022-11-13: qty 1

## 2022-11-13 MED ORDER — PROPOFOL 10 MG/ML IV BOLUS
INTRAVENOUS | Status: DC | PRN
  Administered 2022-11-13: 180 mg via INTRAVENOUS

## 2022-11-13 MED ORDER — ORAL CARE MOUTH RINSE: 15.0000 mL | Freq: Once | OROMUCOSAL | Status: AC

## 2022-11-13 MED ORDER — LIDOCAINE 2% (20 MG/ML) 5 ML SYRINGE
INTRAMUSCULAR | Status: DC | PRN
  Administered 2022-11-13: 100 mg via INTRAVENOUS

## 2022-11-13 MED ORDER — SODIUM CHLORIDE 0.9% FLUSH: 3.0000 mL | Freq: Two times a day (BID) | INTRAVENOUS | Status: DC

## 2022-11-13 MED ORDER — ONDANSETRON HCL 4 MG/2ML IJ SOLN: 4.0000 mg | Freq: Once | INTRAMUSCULAR | Status: DC | PRN

## 2022-11-13 MED ORDER — CEFAZOLIN SODIUM-DEXTROSE 2-4 GM/100ML-% IV SOLN
INTRAVENOUS | Status: AC
  Filled 2022-11-13: qty 100

## 2022-11-13 MED ORDER — CEFAZOLIN SODIUM-DEXTROSE 2-4 GM/100ML-% IV SOLN
2.0000 g | INTRAVENOUS | Status: AC
  Administered 2022-11-13: 2 g via INTRAVENOUS
  Filled 2022-11-13: qty 100

## 2022-11-13 MED ORDER — SUGAMMADEX SODIUM 200 MG/2ML IV SOLN
INTRAVENOUS | Status: DC | PRN
  Administered 2022-11-13: 200 mg via INTRAVENOUS

## 2022-11-13 MED ORDER — STERILE WATER FOR IRRIGATION IR SOLN
Status: DC | PRN
  Administered 2022-11-13: 500 mL

## 2022-11-13 MED ORDER — SODIUM CHLORIDE 0.9 % IR SOLN
Status: DC | PRN
  Administered 2022-11-13: 3000 mL via INTRAVESICAL

## 2022-11-13 MED ORDER — ONDANSETRON HCL 4 MG/2ML IJ SOLN
INTRAMUSCULAR | Status: DC | PRN
  Administered 2022-11-13: 4 mg via INTRAVENOUS

## 2022-11-13 MED ORDER — DEXAMETHASONE SODIUM PHOSPHATE 10 MG/ML IJ SOLN
INTRAMUSCULAR | Status: DC | PRN
  Administered 2022-11-13: 10 mg via INTRAVENOUS

## 2022-11-13 MED ORDER — CHLORHEXIDINE GLUCONATE 0.12 % MT SOLN
15.0000 mL | Freq: Once | OROMUCOSAL | Status: AC
  Administered 2022-11-13: 15 mL via OROMUCOSAL

## 2022-11-13 MED ORDER — MIDAZOLAM HCL 2 MG/2ML IJ SOLN
INTRAMUSCULAR | Status: DC | PRN
  Administered 2022-11-13: 2 mg via INTRAVENOUS

## 2022-11-13 MED ORDER — FENTANYL CITRATE (PF) 100 MCG/2ML IJ SOLN
INTRAMUSCULAR | Status: DC | PRN
  Administered 2022-11-13: 25 ug via INTRAVENOUS
  Administered 2022-11-13: 75 ug via INTRAVENOUS

## 2022-11-13 MED ORDER — PROPOFOL 10 MG/ML IV BOLUS
INTRAVENOUS | Status: AC
  Filled 2022-11-13: qty 20

## 2022-11-13 MED ORDER — MIDAZOLAM HCL 2 MG/2ML IJ SOLN
INTRAMUSCULAR | Status: AC
  Filled 2022-11-13: qty 2

## 2022-11-13 MED ORDER — GEMCITABINE CHEMO FOR BLADDER INSTILLATION 2000 MG
2000.0000 mg | Freq: Once | INTRAVENOUS | Status: AC
  Administered 2022-11-13: 2000 mg via INTRAVESICAL
  Filled 2022-11-13: qty 2000

## 2022-11-13 MED ORDER — AMISULPRIDE (ANTIEMETIC) 5 MG/2ML IV SOLN: 10.0000 mg | Freq: Once | INTRAVENOUS | Status: DC | PRN

## 2022-11-13 MED ORDER — IOHEXOL 300 MG/ML  SOLN
INTRAMUSCULAR | Status: DC | PRN
  Administered 2022-11-13: 20 mL

## 2022-11-13 SURGICAL SUPPLY — 23 items
BAG DRN RND TRDRP ANRFLXCHMBR (UROLOGICAL SUPPLIES) ×1
BAG URINE DRAIN 2000ML AR STRL (UROLOGICAL SUPPLIES) IMPLANT
BAG URO CATCHER STRL LF (MISCELLANEOUS) ×1 IMPLANT
CATH FOLEY 2WAY SLVR  5CC 20FR (CATHETERS) ×1
CATH FOLEY 2WAY SLVR 5CC 20FR (CATHETERS) IMPLANT
CATH FOLEY 3WAY 30CC 22FR (CATHETERS) IMPLANT
CATH URETL OPEN 5X70 (CATHETERS) IMPLANT
DRAPE FOOT SWITCH (DRAPES) ×1 IMPLANT
ELECT REM PT RETURN 15FT ADLT (MISCELLANEOUS) ×1 IMPLANT
GLOVE SURG SS PI 8.0 STRL IVOR (GLOVE) ×1 IMPLANT
GOWN STRL REUS W/ TWL XL LVL3 (GOWN DISPOSABLE) ×1 IMPLANT
GOWN STRL REUS W/TWL XL LVL3 (GOWN DISPOSABLE) ×1
HOLDER FOLEY CATH W/STRAP (MISCELLANEOUS) IMPLANT
KIT TURNOVER KIT A (KITS) ×1 IMPLANT
LOOP CUT BIPOLAR 24F LRG (ELECTROSURGICAL) IMPLANT
MANIFOLD NEPTUNE II (INSTRUMENTS) ×1 IMPLANT
PACK CYSTO (CUSTOM PROCEDURE TRAY) ×1 IMPLANT
SUT ETHILON 3 0 PS 1 (SUTURE) IMPLANT
SYR 30ML LL (SYRINGE) IMPLANT
SYR TOOMEY IRRIG 70ML (MISCELLANEOUS)
SYRINGE TOOMEY IRRIG 70ML (MISCELLANEOUS) IMPLANT
TUBING CONNECTING 10 (TUBING) ×1 IMPLANT
TUBING UROLOGY SET (TUBING) ×1 IMPLANT

## 2022-11-13 NOTE — Op Note (Signed)
Procedure: 1.  Cystoscopy with bilateral retrograde pyelography and interpretation. 2.  Cystoscopy with transurethral resection of bladder tumor. 3.  Application of fluoroscopy. 4.  Instillation of gemcitabine in PACU.  Pre-Op diagnosis: History of bladder cancer with possible recurrence.  Postop diagnosis: Same.  Surgeon: Dr. Irine Seal.  Anesthesia: General.  Specimen: Bladder tumor chips from the right lateral wall.  EBL: None.  Complications: None.  Drain: 41 [place] Foley catheter.  Indications: Patient is a 67 year old male with a history of recurrent urothelial carcinoma who has completed 2 rounds of induction BCG.  He was recently cystoscope in the office and was found to have a possible area of recurrence on the right lateral wall of the bladder as well as some other areas of neovascularity and that were felt to require attention.  Procedure: He was taken the operating room was given 2 g of Ancef.  A general anesthetic was induced.  He was intubated and paralyzed.  He was placed in lithotomy position and fitted with PAS hose.  His perineum and genitalia were prepped with Betadine solution he was draped in usual sterile fashion.  Cystoscopy was performed using a 21 [place] scope and 30 and 70 degree lenses.  Examination revealed a normal urethra.  The external sphincter was intact.  The prostatic urethra was approximately 3 to 4 cm in length with bilobar hyperplasia with coaptation and obstruction.  Examination of bladder revealed mild trabeculation there was some diffuse neovascularity in the bladder.  The right ureteral orifice had evidence of prior resection with a surrounding scar but was otherwise unremarkable.  The left ureteral orifice was unremarkable.  On examination of the bladder wall there was a pink velvety lesion on the right lateral wall consistent with the lesion seen in the office.  No papillary tumors were identified but there was some increased vascularity on the  right anterior bladder neck area and on the posterior wall.  Bilateral retrograde pyelography was performed using 5 [place] open-ended catheters and Omnipaque.  Right retrograde pyelogram demonstrated a normal ureter intrarenal collecting system.  The left retrograde pyelogram demonstrated normal ureter and intrarenal collecting system.  The cystoscope was removed and the urethra was calibrated to 30 [place] with [employer] sounds.  The 48 French continuous-flow resectoscope sheath was then inserted using the visual obturator.  The visual obturator was replaced with the [facility] handle with a bipolar loop and 30 degree lens.  Saline was used as the irrigant.  The lesion on the right lateral wall which is approximately 1 to 1-1/2 cm was then resected and tissue was obtained for pathology.  The areas of increased vascularity on the right anterior bladder neck and posterior wall were then generously fulgurated.  Final inspection demonstrated no retained tissue, no active bleeding and no bladder wall perforation.  The resectoscope was removed and a 20 French Foley catheter was inserted.  The balloon was filled with 10 mL of sterile fluid and the catheter was placed to straight drainage.  He was taken down from lithotomy position, his anesthetic was reversed and he was moved recovery in stable condition.  In the PACU his bladder was instilled with 2000 mg of gemcitabine and 50 mL of diluent.  The catheter was plugged for 1 hour and then placed back to drainage.  There were no complications.

## 2022-11-13 NOTE — Anesthesia Preprocedure Evaluation (Addendum)
Anesthesia Evaluation  Patient identified by MRN, date of birth, ID band Patient awake    Reviewed: Allergy & Precautions, NPO status , Patient's Chart, lab work & pertinent test results  History of Anesthesia Complications (+) DIFFICULT AIRWAY and history of anesthetic complications  Airway Mallampati: II  TM Distance: >3 FB Neck ROM: Full    Dental  (+) Edentulous Upper, Edentulous Lower   Pulmonary Current Smoker and Patient abstained from smoking.   Pulmonary exam normal        Cardiovascular hypertension, Pt. on medications + CAD  Normal cardiovascular exam     Neuro/Psych  Headaches  Neuromuscular disease  negative psych ROS   GI/Hepatic negative GI ROS, Neg liver ROS,,,  Endo/Other  diabetes, Oral Hypoglycemic Agents    Renal/GU Renal disease     Musculoskeletal  (+) Arthritis ,    Abdominal   Peds  Hematology negative hematology ROS (+)   Anesthesia Other Findings BLADDER TUMOR  Reproductive/Obstetrics                             Anesthesia Physical Anesthesia Plan  ASA: 3  Anesthesia Plan: General   Post-op Pain Management:    Induction: Intravenous  PONV Risk Score and Plan: 1 and Ondansetron, Dexamethasone, Midazolam and Treatment may vary due to age or medical condition  Airway Management Planned: Oral ETT and Video Laryngoscope Planned  Additional Equipment:   Intra-op Plan:   Post-operative Plan: Extubation in OR  Informed Consent: I have reviewed the patients History and Physical, chart, labs and discussed the procedure including the risks, benefits and alternatives for the proposed anesthesia with the patient or authorized representative who has indicated his/her understanding and acceptance.       Plan Discussed with: CRNA  Anesthesia Plan Comments: (Patient encouraged to trim beard in pre-op. )       Anesthesia Quick Evaluation

## 2022-11-13 NOTE — Transfer of Care (Signed)
Immediate Anesthesia Transfer of Care Note  Patient: [name] [name]  Procedure(s) Performed: CYSTOSCOPY TRANSURETHRAL RESECTION OF BLADDER TUMOR BILATERAL RETROGRADES WITH INSTILL GEMCTABINE (Bilateral)  Patient Location: PACU  Anesthesia Type:General  Level of Consciousness: drowsy  Airway & Oxygen Therapy: Patient Spontanous Breathing and Patient connected to face mask oxygen  Post-op Assessment: Report given to RN and Post -op Vital signs reviewed and stable  Post vital signs: Reviewed and stable  Last Vitals:  Vitals Value Taken Time  BP 153/87 11/13/22 1046  Temp    Pulse 73 11/13/22 1047  Resp 15 11/13/22 1047  SpO2 97 % 11/13/22 1047  Vitals shown include unvalidated device data.  Last Pain:  Vitals:   11/13/22 0828  TempSrc: Oral  PainSc: 0-No pain         Complications:  Encounter Notable Events  Notable Event Outcome Phase Comment  Difficult to intubate - expected  Intraprocedure Filed from anesthesia note documentation.

## 2022-11-13 NOTE — Discharge Instructions (Signed)
You may remove the catheter in the morning.

## 2022-11-13 NOTE — Anesthesia Procedure Notes (Signed)
Procedure Name: Intubation Date/Time: 11/13/2022 9:59 AM  Performed by: Sharlette Dense, CRNAPre-anesthesia Checklist: Patient identified, Emergency Drugs available, Suction available and Patient being monitored Patient Re-evaluated:Patient Re-evaluated prior to induction Oxygen Delivery Method: Circle system utilized Preoxygenation: Pre-oxygenation with 100% oxygen Induction Type: IV induction Ventilation: Mask ventilation without difficulty and Oral airway inserted - appropriate to patient size Laryngoscope Size: Glidescope and 4 Grade View: Grade I Tube type: Parker flex tip Tube size: 7.5 mm Number of attempts: 1 Airway Equipment and Method: Stylet and Oral airway Placement Confirmation: ETT inserted through vocal cords under direct vision, positive ETCO2 and breath sounds checked- equal and bilateral Secured at: 22 cm Tube secured with: Tape Dental Injury: Teeth and Oropharynx as per pre-operative assessment  Difficulty Due To: Difficulty was anticipated, Difficult Airway- due to reduced neck mobility and Difficult Airway- due to anterior larynx

## 2022-11-13 NOTE — Interval H&P Note (Signed)
History and Physical Interval Note:  I discussed the gemcitabine with him.   11/13/2022 9:32 AM  [name] [name]  has presented today for surgery, with the diagnosis of BLADDER TUMOR.  The various methods of treatment have been discussed with the patient and family. After consideration of risks, benefits and other options for treatment, the patient has consented to  Procedure(s) with comments: [place] (Bilateral) - 1 HR FOR CASE as a surgical intervention.  The patient's history has been reviewed, patient examined, no change in status, stable for surgery.  I have reviewed the patient's chart and labs.  Questions were answered to the patient's satisfaction.     Irine Seal

## 2022-11-14 ENCOUNTER — Encounter (HOSPITAL_COMMUNITY): Payer: Self-pay | Admitting: Urology

## 2022-11-14 LAB — SURGICAL PATHOLOGY

## 2022-11-14 NOTE — Anesthesia Postprocedure Evaluation (Signed)
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 WITH INSTILL GEMCTABINE (Bilateral)     Patient location during evaluation: PACU Anesthesia Type: General Level of consciousness: awake Pain management: pain level controlled Vital Signs Assessment: post-procedure vital signs reviewed and stable Respiratory status: spontaneous breathing, nonlabored ventilation and respiratory function stable Cardiovascular status: blood pressure returned to baseline and stable Postop Assessment: no apparent nausea or vomiting Anesthetic complications: yes   Encounter Notable Events  Notable Event Outcome Phase Comment  Difficult to intubate - expected  Intraprocedure Filed from anesthesia note documentation.    Last Vitals:  Vitals:   11/13/22 1200 11/13/22 1215  BP: (!) 144/81 (!) 151/86  Pulse: 66 66  Resp: 14 12  Temp:    SpO2: 96% 96%    Last Pain:  Vitals:   11/13/22 1215  TempSrc:   PainSc: 0-No pain                 Trentyn Boisclair P Jonel Weldon

## 2022-11-21 DIAGNOSIS — R3915 Urgency of urination: Secondary | ICD-10-CM | POA: Diagnosis not present

## 2022-11-21 DIAGNOSIS — R311 Benign essential microscopic hematuria: Secondary | ICD-10-CM | POA: Diagnosis not present

## 2022-11-21 DIAGNOSIS — Z8551 Personal history of malignant neoplasm of bladder: Secondary | ICD-10-CM | POA: Diagnosis not present

## 2022-12-15 NOTE — Progress Notes (Unsigned)
Subjective:    Patient ID: [name] [name], male    DOB: August 22, 1956, 67 y.o.   MRN: [id number]      HPI Rollen is here for No chief complaint on file.    ? Ingrown hair - present for long time on left jaw line.  It is irritating - he always wants to pick at it.  It does not hurt or itch. When he picks at it - it gets bigger.  It does not drain.  He has been putting triamcinolone cream and ketoconazole cream on it and a few other areas on his face.      Medications and allergies reviewed with patient and updated if appropriate.  Current Outpatient Medications on File Prior to Visit  Medication Sig Dispense Refill   alfuzosin (UROXATRAL) 10 MG 24 hr tablet Take 10 mg by mouth daily.     amLODipine (NORVASC) 5 MG tablet TAKE 1 TABLET BY MOUTH EVERY DAY (Patient taking differently: Take 5 mg by mouth daily as needed (high bp).) 90 tablet 3   Ascorbic Acid (VITAMIN C) 1000 MG tablet Take 1,000 mg by mouth once a week.     aspirin EC 81 MG tablet Take 1 tablet (81 mg total) by mouth daily. 100 tablet 3   blood glucose meter kit and supplies KIT Use to test blood sugar once a day. DX: E11.9 1 each 0   Blood Glucose Monitoring Suppl (CONTOUR NEXT MONITOR) w/Device KIT 1 Device by Does not apply route daily. 1 kit 0   cholecalciferol (VITAMIN D) 1000 UNITS tablet Take 1,000 Units by mouth daily.     CONTOUR NEXT TEST test strip USE AS DIRECTED 100 strip 5   Empagliflozin-metFORMIN HCl ER (SYNJARDY XR) 12.02-999 MG TB24 Take 1 tablet by mouth 2 (two) times daily. 180 tablet 3   finasteride (PROSCAR) 5 MG tablet TAKE 1 TABLET BY MOUTH EVERY DAY 90 tablet 3   glimepiride (AMARYL) 4 MG tablet Take 1 tablet (4 mg total) by mouth 2 (two) times daily. 60 tablet 11   HYDROcodone-acetaminophen (NORCO/VICODIN) 5-325 MG tablet Take 1 tablet by mouth every 6 (six) hours as needed for moderate pain. 6 tablet 0   ibuprofen (ADVIL) 800 MG tablet Take 800-1,600 mg by mouth daily as needed for moderate  pain.     ketoconazole (NIZORAL) 2 % cream Apply 1 Application topically daily as needed for irritation.     Lancets MISC Use to check blood sugar once per day. 100 each 3   olmesartan (BENICAR) 40 MG tablet TAKE 1 TABLET BY MOUTH EVERY DAY (Patient taking differently: Take 40 mg by mouth daily as needed (high bp).) 90 tablet 3   potassium chloride SA (KLOR-CON) 20 MEQ tablet Take 1 tablet (20 mEq total) by mouth daily. (Patient taking differently: Take 20 mEq by mouth 2 (two) times a week.) 90 tablet 11   prednisoLONE acetate (PRED FORTE) 1 % ophthalmic suspension Place 1 drop into both eyes daily as needed (irritation).     repaglinide (PRANDIN) 2 MG tablet Take 2 mg by mouth 3 (three) times daily.     Semaglutide (RYBELSUS) 7 MG TABS Take 1 tablet by mouth daily. Take 30 min ac 30 tablet 3   tadalafil (CIALIS) 5 MG tablet Take 5 mg by mouth daily.     Current Facility-Administered Medications on File Prior to Visit  Medication Dose Route Frequency Provider Last Rate Last Admin   gemcitabine (GEMZAR) chemo syringe for bladder instillation  2,000 mg  2,000 mg Bladder Instillation Once Irine Seal, MD        Review of Systems     Objective:   Vitals:   12/17/22 0853 12/17/22 0857  BP: (!) 142/88 (!) 140/80  Pulse: 75   Temp: 98.2 F (36.8 C)   SpO2: 97%    BP Readings from Last 3 Encounters:  12/17/22 (!) 140/80  11/13/22 (!) 151/86  11/02/22 (!) 175/91   Wt Readings from Last 3 Encounters:  12/17/22 192 lb (87.1 kg)  11/13/22 189 lb (85.7 kg)  11/02/22 189 lb 3.2 oz (85.8 kg)   Body mass index is 30.99 kg/m.    Physical Exam Constitutional:      General: He is not in acute distress.    Appearance: Normal appearance. He is not ill-appearing.  HENT:     Head: Normocephalic and atraumatic.  Skin:    General: Skin is warm and dry.     Comments: Benign appearing skin growths on face - left jaw line is his concern - appears like a fibroma, other skin growth - left cheek,  near right eye - also benign appearing  Neurological:     Mental Status: He is alert.            Assessment & Plan:     Abnormal skin growths on face: Chronic - has been there for years Applying triamcinolone, ketoconazole  - not effective - advised this likely will not help [facility]erral [facility]   Hypertension: Chronic BP elevated here today  Monitors it at home - can be elevated in am but usually gets down to 120's/? Taking medication daily Continue amlodipine 5 mg daily, olmesartan 40 mg daily He has been less active since retiring and has gained weight - eating the same --- stressed regular exercise and weight loss - this would help BP and his sugars

## 2022-12-17 ENCOUNTER — Encounter: Payer: Self-pay | Admitting: Internal Medicine

## 2022-12-17 ENCOUNTER — Ambulatory Visit (INDEPENDENT_AMBULATORY_CARE_PROVIDER_SITE_OTHER): Payer: Medicare Other | Admitting: Internal Medicine

## 2022-12-17 VITALS — BP 140/80 | HR 75 | Temp 98.2°F | Ht 66.0 in | Wt 192.0 lb

## 2022-12-17 DIAGNOSIS — I10 Essential (primary) hypertension: Secondary | ICD-10-CM | POA: Diagnosis not present

## 2022-12-17 DIAGNOSIS — D492 Neoplasm of unspecified behavior of bone, soft tissue, and skin: Secondary | ICD-10-CM

## 2022-12-17 NOTE — Patient Instructions (Addendum)
      A referral was ordered for [facility] - [place].     Someone will call you to schedule an appointment.

## 2022-12-20 ENCOUNTER — Encounter: Payer: Self-pay | Admitting: Internal Medicine

## 2023-01-17 ENCOUNTER — Encounter: Payer: Self-pay | Admitting: Internal Medicine

## 2023-01-22 ENCOUNTER — Other Ambulatory Visit (INDEPENDENT_AMBULATORY_CARE_PROVIDER_SITE_OTHER): Payer: Medicare Other

## 2023-01-22 DIAGNOSIS — E118 Type 2 diabetes mellitus with unspecified complications: Secondary | ICD-10-CM | POA: Diagnosis not present

## 2023-01-22 DIAGNOSIS — I10 Essential (primary) hypertension: Secondary | ICD-10-CM

## 2023-01-22 LAB — COMPREHENSIVE METABOLIC PANEL
ALT: 31 U/L (ref 0–53)
AST: 13 U/L (ref 0–37)
Albumin: 4.2 g/dL (ref 3.5–5.2)
Alkaline Phosphatase: 108 U/L (ref 39–117)
BUN: 13 mg/dL (ref 6–23)
CO2: 27 mEq/L (ref 19–32)
Calcium: 9.3 mg/dL (ref 8.4–10.5)
Chloride: 106 mEq/L (ref 96–112)
Creatinine, Ser: 0.85 mg/dL (ref 0.40–1.50)
GFR: 89.29 mL/min (ref >60.00)
Glucose, Bld: 229 mg/dL — ABNORMAL HIGH (ref 70–99)
Potassium: 3.9 mEq/L (ref 3.5–5.1)
Sodium: 142 mEq/L (ref 135–145)
Total Bilirubin: 0.4 mg/dL (ref 0.2–1.2)
Total Protein: 6.5 g/dL (ref 6.0–8.3)

## 2023-01-22 LAB — HEMOGLOBIN A1C: Hgb A1c MFr Bld: 7.9 % — ABNORMAL HIGH (ref 4.6–6.5)

## 2023-01-23 ENCOUNTER — Encounter: Payer: Self-pay | Admitting: Internal Medicine

## 2023-01-23 ENCOUNTER — Telehealth: Payer: Self-pay | Admitting: Internal Medicine

## 2023-01-23 ENCOUNTER — Ambulatory Visit (INDEPENDENT_AMBULATORY_CARE_PROVIDER_SITE_OTHER): Payer: Medicare Other | Admitting: Internal Medicine

## 2023-01-23 VITALS — BP 130/84 | HR 79 | Temp 98.4°F | Ht 66.0 in | Wt 189.0 lb

## 2023-01-23 DIAGNOSIS — D751 Secondary polycythemia: Secondary | ICD-10-CM

## 2023-01-23 DIAGNOSIS — L299 Pruritus, unspecified: Secondary | ICD-10-CM

## 2023-01-23 DIAGNOSIS — R413 Other amnesia: Secondary | ICD-10-CM

## 2023-01-23 DIAGNOSIS — F1220 Cannabis dependence, uncomplicated: Secondary | ICD-10-CM | POA: Insufficient documentation

## 2023-01-23 DIAGNOSIS — R202 Paresthesia of skin: Secondary | ICD-10-CM | POA: Diagnosis not present

## 2023-01-23 DIAGNOSIS — E118 Type 2 diabetes mellitus with unspecified complications: Secondary | ICD-10-CM

## 2023-01-23 MED ORDER — AMLODIPINE BESYLATE 5 MG PO TABS: 5.0000 mg | ORAL_TABLET | Freq: Every day | ORAL | 3 refills | Status: DC

## 2023-01-23 MED ORDER — CLOBETASOL PROPIONATE 0.05 % EX SOLN: 1.0000 | Freq: Two times a day (BID) | CUTANEOUS | 3 refills | Status: AC | PRN

## 2023-01-23 MED ORDER — RYBELSUS 14 MG PO TABS: 14.0000 mg | ORAL_TABLET | Freq: Every day | ORAL | 5 refills | Status: DC

## 2023-01-23 NOTE — Assessment & Plan Note (Signed)
Chronic, no dandruff Treat DM Start Clobetasol scalp sol

## 2023-01-23 NOTE — Assessment & Plan Note (Addendum)
Worsening memory problems - twice did not put a pot in the coffee maker twice this week and several times prior.  Pt smokes pot several times a day and cigarettes - needs to quit.... C/o being agitated easy w/family. Dad w/Alzheimer's.  Options discussed. Neurology ref  Cut back or stop using THC

## 2023-01-23 NOTE — Telephone Encounter (Signed)
Pt has [employer].Marland KitchenRaechel [name]

## 2023-01-23 NOTE — Assessment & Plan Note (Signed)
Check CBC 

## 2023-01-23 NOTE — Assessment & Plan Note (Addendum)
On Synjardy, Rubelsus, Prandin  Rybelsus 7 mg/d - increase to 14 mg/d Labs q 3 months

## 2023-01-23 NOTE — Telephone Encounter (Signed)
Patient would like lab orders placed so that he can come in a few days before his appointment on 04/23/23 to get his labs done.

## 2023-01-23 NOTE — Progress Notes (Signed)
Subjective:  Patient ID: [name] [name], male    DOB: 1956/02/01  Age: 67 y.o. MRN: [id number]  CC: Follow-up (3 mnth f/u, MEMORY ISSUES, AGITATED VERY EASILY )   HPI BRADDOCK THORNGREN presents for DM, CRI C/o memory problems - twice did not put a pot in the coffee maker twice this week and several times prior. Co short term memory loss. Pt smokes pot several times a day and cigarettes. C/o being agitated easy w/family. Dad w/Alzheimer's C/o itchy scalp  Outpatient Medications Prior to Visit  Medication Sig Dispense Refill   alfuzosin (UROXATRAL) 10 MG 24 hr tablet Take 10 mg by mouth daily.     Ascorbic Acid (VITAMIN C) 1000 MG tablet Take 1,000 mg by mouth once a week.     aspirin EC 81 MG tablet Take 1 tablet (81 mg total) by mouth daily. 100 tablet 3   blood glucose meter kit and supplies KIT Use to test blood sugar once a day. DX: E11.9 1 each 0   Blood Glucose Monitoring Suppl (CONTOUR NEXT MONITOR) w/Device KIT 1 Device by Does not apply route daily. 1 kit 0   cholecalciferol (VITAMIN D) 1000 UNITS tablet Take 1,000 Units by mouth daily.     CONTOUR NEXT TEST test strip USE AS DIRECTED 100 strip 5   Empagliflozin-metFORMIN HCl ER (SYNJARDY XR) 12.02-999 MG TB24 Take 1 tablet by mouth 2 (two) times daily. 180 tablet 3   finasteride (PROSCAR) 5 MG tablet TAKE 1 TABLET BY MOUTH EVERY DAY 90 tablet 3   glimepiride (AMARYL) 4 MG tablet Take 1 tablet (4 mg total) by mouth 2 (two) times daily. 60 tablet 11   HYDROcodone-acetaminophen (NORCO/VICODIN) 5-325 MG tablet Take 1 tablet by mouth every 6 (six) hours as needed for moderate pain. 6 tablet 0   ibuprofen (ADVIL) 800 MG tablet Take 800-1,600 mg by mouth daily as needed for moderate pain.     ketoconazole (NIZORAL) 2 % cream Apply 1 Application topically daily as needed for irritation.     Lancets MISC Use to check blood sugar once per day. 100 each 3   olmesartan (BENICAR) 40 MG tablet TAKE 1 TABLET BY MOUTH EVERY DAY (Patient  taking differently: Take 40 mg by mouth daily as needed (high bp).) 90 tablet 3   potassium chloride SA (KLOR-CON) 20 MEQ tablet Take 1 tablet (20 mEq total) by mouth daily. (Patient taking differently: Take 20 mEq by mouth 2 (two) times a week.) 90 tablet 11   prednisoLONE acetate (PRED FORTE) 1 % ophthalmic suspension Place 1 drop into both eyes daily as needed (irritation).     repaglinide (PRANDIN) 2 MG tablet Take 2 mg by mouth 3 (three) times daily.     tadalafil (CIALIS) 5 MG tablet Take 5 mg by mouth daily.     amLODipine (NORVASC) 5 MG tablet TAKE 1 TABLET BY MOUTH EVERY DAY (Patient taking differently: Take 5 mg by mouth daily as needed (high bp).) 90 tablet 3   Semaglutide (RYBELSUS) 7 MG TABS Take 1 tablet by mouth daily. Take 30 min ac 30 tablet 3   Facility-Administered Medications Prior to Visit  Medication Dose Route Frequency Provider Last Rate Last Admin   gemcitabine (GEMZAR) chemo syringe for bladder instillation 2,000 mg  2,000 mg Bladder Instillation Once Bjorn Pippin, MD        ROS: Review of Systems  Constitutional:  Negative for appetite change, fatigue and unexpected weight change.  HENT:  Negative for congestion,  nosebleeds, sneezing, sore throat and trouble swallowing.   Eyes:  Negative for itching and visual disturbance.  Respiratory:  Negative for cough.   Cardiovascular:  Negative for chest pain, palpitations and leg swelling.  Gastrointestinal:  Negative for abdominal distention, blood in stool, diarrhea and nausea.  Genitourinary:  Negative for frequency and hematuria.  Musculoskeletal:  Negative for back pain, gait problem, joint swelling and neck pain.  Skin:  Negative for rash.  Neurological:  Negative for dizziness, tremors, speech difficulty and weakness.  Psychiatric/Behavioral:  Positive for confusion and decreased concentration. Negative for agitation, dysphoric mood and sleep disturbance. The patient is nervous/anxious.     Objective:  BP 130/84  (BP Location: Left Arm, Patient Position: Sitting, Cuff Size: Large)   Pulse 79   Temp 98.4 F (36.9 C) (Oral)   Ht 5\' 6"  (1.676 m)   Wt 189 lb (85.7 kg)   SpO2 96%   BMI 30.51 kg/m   BP Readings from Last 3 Encounters:  01/23/23 130/84  12/17/22 (!) 140/80  11/13/22 (!) 151/86    Wt Readings from Last 3 Encounters:  01/23/23 189 lb (85.7 kg)  12/17/22 192 lb (87.1 kg)  11/13/22 189 lb (85.7 kg)    Physical Exam Constitutional:      General: He is not in acute distress.    Appearance: He is well-developed.     Comments: NAD  Eyes:     Conjunctiva/sclera: Conjunctivae normal.     Pupils: Pupils are equal, round, and reactive to light.  Neck:     Thyroid: No thyromegaly.     Vascular: No JVD.  Cardiovascular:     Rate and Rhythm: Normal rate and regular rhythm.     Heart sounds: Normal heart sounds. No murmur heard.    No friction rub. No gallop.  Pulmonary:     Effort: Pulmonary effort is normal. No respiratory distress.     Breath sounds: Normal breath sounds. No wheezing or rales.  Chest:     Chest wall: No tenderness.  Abdominal:     General: Bowel sounds are normal. There is no distension.     Palpations: Abdomen is soft. There is no mass.     Tenderness: There is no abdominal tenderness. There is no guarding or rebound.  Musculoskeletal:        General: No tenderness. Normal range of motion.     Cervical back: Normal range of motion.  Lymphadenopathy:     Cervical: No cervical adenopathy.  Skin:    General: Skin is warm and dry.     Findings: No rash.  Neurological:     Mental Status: He is alert and oriented to person, place, and time.     Cranial Nerves: No cranial nerve deficit.     Motor: No abnormal muscle tone.     Coordination: Coordination normal.     Gait: Gait normal.     Deep Tendon Reflexes: Reflexes are normal and symmetric.  Psychiatric:        Behavior: Behavior normal.        Thought Content: Thought content normal.        Judgment:  Judgment normal.   A/o/c  Lab Results  Component Value Date   WBC 9.0 11/02/2022   HGB 17.6 (H) 11/02/2022   HCT 52.7 (H) 11/02/2022   PLT 141 (L) 11/02/2022   GLUCOSE 229 (H) 01/22/2023   CHOL 153 03/06/2021   TRIG 170.0 (H) 03/06/2021   HDL 33.50 (L) 03/06/2021  LDLCALC 86 03/06/2021   ALT 31 01/22/2023   AST 13 01/22/2023   NA 142 01/22/2023   K 3.9 01/22/2023   CL 106 01/22/2023   CREATININE 0.85 01/22/2023   BUN 13 01/22/2023   CO2 27 01/22/2023   TSH 0.64 03/06/2021   PSA 3.87 03/06/2021   INR 1.1 (H) 01/08/2012   HGBA1C 7.9 (H) 01/22/2023   MICROALBUR 1.3 03/06/2021    DG C-Arm 1-60 Min-No Report  Result Date: 11/13/2022 Fluoroscopy was utilized by the requesting physician.  No radiographic interpretation.    Assessment & Plan:   Problem List Items Addressed This Visit       Endocrine   DM type 2, controlled, with complication - Primary    On Synjardy, Rubelsus, Prandin  Rybelsus 7 mg/d - increase to 14 mg/d Labs q 3 months      Relevant Medications   Semaglutide (RYBELSUS) 14 MG TABS   Other Relevant Orders   Comprehensive metabolic panel   Hemoglobin A1c     Musculoskeletal and Integument   Scalp itch    Chronic, no dandruff Treat DM Start Clobetasol scalp sol        Other   Memory problem    Worsening memory problems - twice did not put a pot in the coffee maker twice this week and several times prior.  Pt smokes pot several times a day and cigarettes - needs to quit.... C/o being agitated easy w/family. Dad w/Alzheimer's.  Options discussed. Neurology ref  Cut back or stop using THC      Relevant Orders   Ambulatory referral to Neurology   Comprehensive metabolic panel   Vitamin B12   CBC with Differential/Platelet   Polycythemia, secondary    Check CBC      Tetrahydrocannabinol (THC) use disorder, moderate, dependence   Other Visit Diagnoses     Paresthesias       Relevant Orders   Vitamin B12         Meds  ordered this encounter  Medications   amLODipine (NORVASC) 5 MG tablet    Sig: Take 1 tablet (5 mg total) by mouth daily.    Dispense:  90 tablet    Refill:  3   Semaglutide (RYBELSUS) 14 MG TABS    Sig: Take 1 tablet (14 mg total) by mouth daily.    Dispense:  30 tablet    Refill:  5   clobetasol (TEMOVATE) 0.05 % external solution    Sig: Apply 1 Application topically 2 (two) times daily as needed. Itchy scalp    Dispense:  50 mL    Refill:  3      Follow-up: No follow-ups on file.  Sonda Primes, MD

## 2023-01-24 NOTE — Telephone Encounter (Signed)
His labs were ordered before he left.  Thanks

## 2023-01-24 NOTE — Telephone Encounter (Signed)
Notified pt w/ MD response. He will go to [facility] to have done.Marland KitchenRaechel Chute

## 2023-01-28 DIAGNOSIS — D485 Neoplasm of uncertain behavior of skin: Secondary | ICD-10-CM | POA: Diagnosis not present

## 2023-01-28 DIAGNOSIS — D2339 Other benign neoplasm of skin of other parts of face: Secondary | ICD-10-CM | POA: Diagnosis not present

## 2023-01-30 DIAGNOSIS — E1142 Type 2 diabetes mellitus with diabetic polyneuropathy: Secondary | ICD-10-CM | POA: Diagnosis not present

## 2023-01-30 DIAGNOSIS — L603 Nail dystrophy: Secondary | ICD-10-CM | POA: Diagnosis not present

## 2023-02-02 ENCOUNTER — Ambulatory Visit
Admission: EM | Admit: 2023-02-02 | Discharge: 2023-02-02 | Disposition: A | Discharge status: Home / Self Care | Payer: Medicare Other | Attending: Internal Medicine | Admitting: Internal Medicine

## 2023-02-02 ENCOUNTER — Other Ambulatory Visit: Payer: Self-pay

## 2023-02-02 ENCOUNTER — Encounter: Payer: Self-pay | Admitting: *Deleted

## 2023-02-02 DIAGNOSIS — K2970 Gastritis, unspecified, without bleeding: Secondary | ICD-10-CM

## 2023-02-02 DIAGNOSIS — K209 Esophagitis, unspecified without bleeding: Secondary | ICD-10-CM

## 2023-02-02 MED ORDER — PANTOPRAZOLE SODIUM 20 MG PO TBEC: 20.0000 mg | DELAYED_RELEASE_TABLET | Freq: Every day | ORAL | 0 refills | Status: DC

## 2023-02-02 MED ORDER — ONDANSETRON 4 MG PO TBDP: 4.0000 mg | ORAL_TABLET | Freq: Three times a day (TID) | ORAL | 0 refills | Status: DC | PRN

## 2023-02-02 NOTE — ED Triage Notes (Signed)
C/O starting 3 nights ago with belching after dinner; states belching "smelled like farts" and had pain across abd; states pain has resolved, but now has has progressed to nausea, hiccups, "burning up and down my esophagus", vomiting, poor appetite. States PCP changed Rybelsus dose 4 days ago. Reports losing 9 lbs in the past week.

## 2023-02-02 NOTE — Discharge Instructions (Addendum)
Please take medications as prescribed Your symptoms may be related to increase in your Rybelsus dose Please continue at the same dose Follow-up with your primary care physician or return to urgent care if you have any other concerns.

## 2023-02-02 NOTE — ED Provider Notes (Signed)
[facility]    CSN: [id number] Arrival date & time: 02/02/23  1338      History   Chief Complaint Chief Complaint  Patient presents with   Emesis    HPI [name] [name] is a 67 y.o. male comes to the urgent care with a 3-day history of upper abdominal pain, nausea, acid reflux and excessive belching.  Patient's Rybelsus dose was increased from 7 to.  14 mg earlier this week.  The day after changing the dose, patient started experiencing above-mentioned symptoms.  The belching is very foul-smelling and associated with nausea and hiccups.  Patient experiences momentary relief when he tries to induce vomiting.  He also experiences burning sensation up and down his esophagus.  This has resulted in poor oral intake and loss of appetite over the past 3 days.  He has lost 9 pounds.  No fever, chills or vomiting.  No aspiration or vomitus.  No shortness of breath.  No diarrhea  HPI  Past Medical History:  Diagnosis Date   Benign localized prostatic hyperplasia with lower urinary tract symptoms (LUTS)    urologist--- dr Annabell Howells   Bladder cancer    urologist---- dr Annabell Howells---  s/p TURBT 11/2020; 03/ 2022   Coronary artery disease    mild   DDD (degenerative disc disease), cervical    From 1980s   Diabetes mellitus type II    followed by pcp--   (01-05-2021  per pt checks blood sugar three times per week,  fasting sugar --- 130--140_   Difficult intubation 07/28/2012   partial nephrectomy done @[facility]  07-28-2012  found to have anterior larynx , diffcult intubation, referred to anesthesia record in epic    Dyslipidemia    ED (erectile dysfunction)    Elevated PSA    GERD (gastroesophageal reflux disease)    History of adenomatous polyp of colon    History of kidney stones    History of prostatitis 2008   History of renal carcinoma urologist--- dr Annabell Howells   07-28-2012 for renal neoplasm  s/p right partal nephrectomy    Hypertension    followed by pcp   Osteoarthritis    Back    Renal carcinoma, right     Patient Active Problem List   Diagnosis Date Noted   Scalp itch 01/23/2023   Tetrahydrocannabinol (THC) use disorder, moderate, dependence 01/23/2023   Polyneuropathy due to type 2 diabetes mellitus 10/23/2022   Bladder cancer 10/23/2022   Memory problem 07/17/2022   Leg pain, anterior, right 12/26/2021   Gross hematuria 09/16/2021   Statin declined 08/06/2021   Acute abscess of face 08/03/2021   Atherosclerosis of aorta 04/26/2021   Coronary atherosclerosis 01/08/2021   Ascending aortic aneurysm 01/08/2021   Bladder neck obstruction 10/20/2019   History of colonic polyps    Benign neoplasm of ascending colon    Benign neoplasm of transverse colon    Viral illness 06/16/2019   Exposure to COVID-19 virus 02/02/2019   Cerumen impaction 08/27/2018   Pilonidal cyst 01/18/2017   Neoplasm of uncertain behavior of skin 01/18/2017   Hypokalemia 12/11/2016   Tobacco use disorder 10/17/2016   Well adult exam 06/22/2016   Polycythemia, 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   Abscessed tooth 07/06/2015   Sebaceous cyst 07/06/2015   Common cold virus 12/23/2014   Infected sebaceous cyst 11/09/2014   Dyslipidemia 05/19/2013   URI, acute 09/15/2012   Strep throat 09/15/2012   Renal cell cancer 09/02/2012   Adrenal mass (HCC) 06/03/2012   Nephrolithiasis 06/03/2012  Otitis media 01/08/2012   Dark urine 01/08/2012   Decreased vision in both eyes 01/08/2012   Onychomycosis 05/25/2011   DM type 2, controlled, with complication 05/23/2010   Keloid scar 03/07/2010   SKIN RASH 03/07/2010   LACERATION, FINGER 09/15/2009   Osteoarthritis 08/15/2009   LOW BACK PAIN 08/15/2009   CELLULITIS AND ABSCESS OF BUTTOCK 03/29/2009   CERVICAL RADICULOPATHY 11/09/2008   WARTS, VIRAL, UNSPECIFIED 10/28/2007   Essential hypertension 06/30/2007   CLUSTER HEADACHE 06/27/2007   PSA, INCREASED 06/27/2007    Past Surgical History:  Procedure Laterality Date   CATARACT EXTRACTION      COLONOSCOPY N/A 03/03/2013   Procedure: COLONOSCOPY;  Surgeon: Hilarie Fredrickson, MD;  Location: [facility];  Service: Endoscopy;  Laterality: N/A;   COLONOSCOPY WITH PROPOFOL N/A 09/29/2019   Procedure: COLONOSCOPY WITH PROPOFOL;  Surgeon: Hilarie Fredrickson, MD;  Location: [facility];  Service: Endoscopy;  Laterality: N/A;   CYSTOSCOPY WITH BIOPSY N/A 12/09/2020   Procedure: CYSTOSCOPY WITH TURBT, BIOPSY AND FULGURATION;  Surgeon: Bjorn Pippin, MD;  Location: [facility];  Service: Urology;  Laterality: N/A;  REQUESTING 45 MINS   EXTRACORPOREAL SHOCK WAVE LITHOTRIPSY  01-10-2015;  03-03-2012     POLYPECTOMY  09/29/2019   Procedure: POLYPECTOMY;  Surgeon: Hilarie Fredrickson, MD;  Location: [facility];  Service: Endoscopy;;   ROBOT ASSISTED LAPAROSCOPIC PARTIAL NEPHRECTOMY  07-28-2012   DR Laverle Patter     TONSILLECTOMY  1965   TRANSURETHRAL RESECTION OF BLADDER TUMOR WITH MITOMYCIN-C N/A 01/06/2021   Procedure: TRANSURETHRAL RESECTION OF BLADDER TUMOR WITH GEMCITIBINE IN PACU;  Surgeon: Bjorn Pippin, MD;  Location: [facility];  Service: Urology;  Laterality: N/A;   TRANSURETHRAL RESECTION OF BLADDER TUMOR WITH MITOMYCIN-C N/A 09/01/2021   Procedure: TRANSURETHRAL RESECTION OF BLADDER TUMOR WITH POST OPERATIVE INSTILLATION OF GEMCITABINE;  Surgeon: Bjorn Pippin, MD;  Location: [facility];  Service: Urology;  Laterality: N/A;   TRANSURETHRAL RESECTION OF BLADDER TUMOR WITH MITOMYCIN-C Bilateral 11/13/2022   Procedure: CYSTOSCOPY TRANSURETHRAL RESECTION OF BLADDER TUMOR BILATERAL RETROGRADES WITH INSTILL GEMCTABINE;  Surgeon: Bjorn Pippin, MD;  Location: [facility];  Service: Urology;  Laterality: Bilateral;  1 HR FOR CASE       Home Medications    Prior to Admission medications   Medication Sig Start Date End Date Taking? Authorizing Provider  alfuzosin (UROXATRAL) 10 MG 24 hr tablet Take 10 mg by mouth daily. 04/26/22  Yes [provider]  amLODipine (NORVASC) 5 MG tablet  Take 1 tablet (5 mg total) by mouth daily. 01/23/23  Yes Plotnikov, Georgina Quint, MD  Ascorbic Acid (VITAMIN C) 1000 MG tablet Take 1,000 mg by mouth once a week.   Yes [provider]  cholecalciferol (VITAMIN D) 1000 UNITS tablet Take 1,000 Units by mouth daily.   Yes [provider]  clobetasol (TEMOVATE) 0.05 % external solution Apply 1 Application topically 2 (two) times daily as needed. Itchy scalp 01/23/23  Yes Plotnikov, Georgina Quint, MD  finasteride (PROSCAR) 5 MG tablet TAKE 1 TABLET BY MOUTH EVERY DAY 12/26/20  Yes Plotnikov, Georgina Quint, MD  glimepiride (AMARYL) 4 MG tablet Take 1 tablet (4 mg total) by mouth 2 (two) times daily. 11/08/22  Yes Plotnikov, Georgina Quint, MD  ketoconazole (NIZORAL) 2 % cream Apply 1 Application topically daily as needed for irritation. 02/25/22  Yes [provider]  olmesartan (BENICAR) 40 MG tablet TAKE 1 TABLET BY MOUTH EVERY DAY Patient taking differently: Take 40 mg by mouth daily as  needed (high bp). 02/27/22  Yes Plotnikov, Georgina Quint, MD  ondansetron (ZOFRAN-ODT) 4 MG disintegrating tablet Take 1 tablet (4 mg total) by mouth every 8 (eight) hours as needed for nausea or vomiting. 02/02/23  Yes Augustina Braddock, Britta Mccreedy, MD  pantoprazole (PROTONIX) 20 MG tablet Take 1 tablet (20 mg total) by mouth daily. 02/02/23  Yes Neko Boyajian, Britta Mccreedy, MD  Semaglutide (RYBELSUS) 14 MG TABS Take 1 tablet (14 mg total) by mouth daily. 01/23/23  Yes Plotnikov, Georgina Quint, MD  aspirin EC 81 MG tablet Take 1 tablet (81 mg total) by mouth daily. 10/23/22 10/23/23  Plotnikov, Georgina Quint, MD  blood glucose meter kit and supplies KIT Use to test blood sugar once a day. DX: E11.9 12/19/16   Plotnikov, Georgina Quint, MD  Blood Glucose Monitoring Suppl (CONTOUR NEXT MONITOR) w/Device KIT 1 Device by Does not apply route daily. 12/08/19   Plotnikov, Georgina Quint, MD  CONTOUR NEXT TEST test strip USE AS DIRECTED 03/21/20   Plotnikov, Georgina Quint, MD  Empagliflozin-metFORMIN HCl ER (SYNJARDY XR) 12.02-999  MG TB24 Take 1 tablet by mouth 2 (two) times daily. 08/03/21   Plotnikov, Georgina Quint, MD  HYDROcodone-acetaminophen (NORCO/VICODIN) 5-325 MG tablet Take 1 tablet by mouth every 6 (six) hours as needed for moderate pain. 11/13/22 11/13/23  Bjorn Pippin, MD  ibuprofen (ADVIL) 800 MG tablet Take 800-1,600 mg by mouth daily as needed for moderate pain.    [provider]  Lancets MISC Use to check blood sugar once per day. 12/19/16   Plotnikov, Georgina Quint, MD  potassium chloride SA (KLOR-CON) 20 MEQ tablet Take 1 tablet (20 mEq total) by mouth daily. Patient taking differently: Take 20 mEq by mouth 2 (two) times a week. 09/06/20   Plotnikov, Georgina Quint, MD  prednisoLONE acetate (PRED FORTE) 1 % ophthalmic suspension Place 1 drop into both eyes daily as needed (irritation).    [provider]  repaglinide (PRANDIN) 2 MG tablet Take 2 mg by mouth 3 (three) times daily. 11/20/22   [provider]  tadalafil (CIALIS) 5 MG tablet Take 5 mg by mouth daily. 09/02/20   [provider]    Family History Family History  Problem Relation Age of Onset   Lymphoma Mother    Colon cancer Mother 71   Colon cancer Paternal Aunt    Esophageal cancer Neg Hx    Rectal cancer Neg Hx    Stomach cancer Neg Hx     Social History Social History   Tobacco Use   Smoking status: Every Day    Packs/day: 0.25    Years: 30.00    Additional pack years: 0.00    Total pack years: 7.50    Types: Cigarettes   Smokeless tobacco: Never   Tobacco comments:    1 pack every 2 days  Vaping Use   Vaping Use: Never used  Substance Use Topics   Alcohol use: Not Currently   Drug use: Yes    Types: Marijuana     Allergies   Tamsulosin, Codeine, and Wound dressing adhesive   Review of Systems Review of Systems As per HPI  Physical Exam Triage Vital Signs ED Triage Vitals  Enc Vitals Group     BP 02/02/23 1403 (!) 160/95     Pulse Rate 02/02/23 1403 81     Resp 02/02/23 1403 18      Temp 02/02/23 1403 99.1 F (37.3 C)     Temp Source 02/02/23 1403 Oral  SpO2 02/02/23 1403 96 %     Weight --      Height --      Head Circumference --      Peak Flow --      Pain Score 02/02/23 1404 0     Pain Loc --      Pain Edu? --      Excl. in GC? --    No data found.  Updated Vital Signs BP (!) 160/95 Comment: has not taken HTN med x 2 days  Pulse 81   Temp 99.1 F (37.3 C) (Oral)   Resp 18   SpO2 96%   Visual Acuity Right Eye Distance:   Left Eye Distance:   Bilateral Distance:    Right Eye Near:   Left Eye Near:    Bilateral Near:     Physical Exam Vitals and nursing note reviewed.  Constitutional:      General: He is not in acute distress.    Appearance: He is not ill-appearing.  HENT:     Right Ear: Tympanic membrane normal.     Left Ear: Tympanic membrane normal.  Cardiovascular:     Rate and Rhythm: Normal rate and regular rhythm.     Pulses: Normal pulses.     Heart sounds: Normal heart sounds.  Pulmonary:     Effort: Pulmonary effort is normal.     Breath sounds: Normal breath sounds.  Abdominal:     General: Bowel sounds are normal.     Palpations: Abdomen is soft.  Neurological:     Mental Status: He is alert.      UC Treatments / Results  Labs (all labs ordered are listed, but only abnormal results are displayed) Labs Reviewed - No data to display  EKG   Radiology No results found.  Procedures Procedures (including critical care time)  Medications Ordered in UC Medications - No data to display  Initial Impression / Assessment and Plan / UC Course  I have reviewed the triage vital signs and the nursing notes.  Pertinent labs & imaging results that were available during my care of the patient were reviewed by me and considered in my medical decision making (see chart for details).     1.  Acute gastroesophagitis: Zofran ODT as needed for nausea/vomiting Protonix 20 mg orally daily Patient is encouraged to continue  Rybelsus dose Patient is encouraged to maintain adequate hydration If symptoms worsens patient is advised to return to urgent care to be reevaluated. Final Clinical Impressions(s) / UC Diagnoses   Final diagnoses:  Gastroesophagitis     Discharge Instructions      Please take medications as prescribed Your symptoms may be related to increase in your Rybelsus dose Please continue at the same dose Follow-up with your primary care physician or return to urgent care if you have any other concerns.   ED Prescriptions     Medication Sig Dispense Auth. Provider   ondansetron (ZOFRAN-ODT) 4 MG disintegrating tablet Take 1 tablet (4 mg total) by mouth every 8 (eight) hours as needed for nausea or vomiting. 20 tablet Tayloranne Lekas, Britta Mccreedy, MD   pantoprazole (PROTONIX) 20 MG tablet Take 1 tablet (20 mg total) by mouth daily. 30 tablet Tahiry Spicer, Britta Mccreedy, MD      PDMP not reviewed this encounter.   Merrilee Jansky, MD 02/02/23 [phone]

## 2023-02-05 ENCOUNTER — Ambulatory Visit (INDEPENDENT_AMBULATORY_CARE_PROVIDER_SITE_OTHER): Payer: Medicare Other

## 2023-02-05 ENCOUNTER — Encounter: Payer: Self-pay | Admitting: Family Medicine

## 2023-02-05 ENCOUNTER — Ambulatory Visit (INDEPENDENT_AMBULATORY_CARE_PROVIDER_SITE_OTHER): Payer: Medicare Other | Admitting: Family Medicine

## 2023-02-05 VITALS — BP 130/74 | HR 64 | Temp 97.8°F | Ht 66.0 in | Wt 188.0 lb

## 2023-02-05 DIAGNOSIS — R066 Hiccough: Secondary | ICD-10-CM

## 2023-02-05 DIAGNOSIS — E876 Hypokalemia: Secondary | ICD-10-CM | POA: Diagnosis not present

## 2023-02-05 DIAGNOSIS — R1010 Upper abdominal pain, unspecified: Secondary | ICD-10-CM | POA: Diagnosis not present

## 2023-02-05 DIAGNOSIS — K219 Gastro-esophageal reflux disease without esophagitis: Secondary | ICD-10-CM | POA: Insufficient documentation

## 2023-02-05 DIAGNOSIS — R9431 Abnormal electrocardiogram [ECG] [EKG]: Secondary | ICD-10-CM

## 2023-02-05 DIAGNOSIS — R140 Abdominal distension (gaseous): Secondary | ICD-10-CM | POA: Diagnosis not present

## 2023-02-05 LAB — COMPREHENSIVE METABOLIC PANEL
ALT: 24 U/L (ref 0–53)
AST: 11 U/L (ref 0–37)
Albumin: 3.7 g/dL (ref 3.5–5.2)
Alkaline Phosphatase: 83 U/L (ref 39–117)
BUN: 15 mg/dL (ref 6–23)
CO2: 36 mEq/L — ABNORMAL HIGH (ref 19–32)
Calcium: 9.6 mg/dL (ref 8.4–10.5)
Chloride: 99 mEq/L (ref 96–112)
Creatinine, Ser: 0.95 mg/dL (ref 0.40–1.50)
GFR: 82.23 mL/min (ref >60.00)
Glucose, Bld: 191 mg/dL — ABNORMAL HIGH (ref 70–99)
Potassium: 3.2 mEq/L — ABNORMAL LOW (ref 3.5–5.1)
Sodium: 141 mEq/L (ref 135–145)
Total Bilirubin: 0.5 mg/dL (ref 0.2–1.2)
Total Protein: 5.9 g/dL — ABNORMAL LOW (ref 6.0–8.3)

## 2023-02-05 LAB — CBC WITH DIFFERENTIAL/PLATELET
Basophils Absolute: 0.1 10*3/uL (ref 0.0–0.1)
Basophils Relative: 0.8 % (ref 0.0–3.0)
Eosinophils Absolute: 0.2 10*3/uL (ref 0.0–0.7)
Eosinophils Relative: 2.4 % (ref 0.0–5.0)
HCT: 45.3 % (ref 39.0–52.0)
Hemoglobin: 15.6 g/dL (ref 13.0–17.0)
Lymphocytes Relative: 13.6 % (ref 12.0–46.0)
Lymphs Abs: 1.2 10*3/uL (ref 0.7–4.0)
MCHC: 34.6 g/dL (ref 30.0–36.0)
MCV: 86.7 fl (ref 78.0–100.0)
Monocytes Absolute: 0.9 10*3/uL (ref 0.1–1.0)
Monocytes Relative: 10.3 % (ref 3.0–12.0)
Neutro Abs: 6.3 10*3/uL (ref 1.4–7.7)
Neutrophils Relative %: 72.9 % (ref 43.0–77.0)
Platelets: 154 10*3/uL (ref 150.0–400.0)
RBC: 5.22 Mil/uL (ref 4.22–5.81)
RDW: 14.2 % (ref 11.5–15.5)
WBC: 8.6 10*3/uL (ref 4.0–10.5)

## 2023-02-05 LAB — LIPASE: Lipase: 26 U/L (ref 11.0–59.0)

## 2023-02-05 LAB — TROPONIN I (HIGH SENSITIVITY): High Sens Troponin I: 15 ng/L (ref 2–17)

## 2023-02-05 LAB — AMYLASE: Amylase: 51 U/L (ref 27–131)

## 2023-02-05 MED ORDER — CHLORPROMAZINE HCL 10 MG PO TABS
10.0000 mg | ORAL_TABLET | Freq: Three times a day (TID) | ORAL | 0 refills | Status: DC | PRN
Start: 2023-02-05 — End: 2023-07-31

## 2023-02-05 MED ORDER — PANTOPRAZOLE SODIUM 20 MG PO TBEC
20.0000 mg | DELAYED_RELEASE_TABLET | Freq: Two times a day (BID) | ORAL | 0 refills | Status: DC
Start: 2023-02-05 — End: 2023-03-07

## 2023-02-05 NOTE — Patient Instructions (Signed)
Increase Protonix to twice daily before meals. I sent in a new prescription   Take your potassium supplement daily, your potassium is low today.   Take Thorazine up to 3 times daily for hiccups.   See the attached pages for hiccups.   If you develop chest pain, palpitations, vomiting, or any worsening abdominal pain then call 911 or go to the emergency department.

## 2023-02-05 NOTE — Progress Notes (Signed)
Subjective:     Patient ID: [name] [name], male    DOB: Dec 12, 1955, 67 y.o.   MRN: [id number]  Chief Complaint  Patient presents with   Hiccups    Leaves every now and then but comes back, going on for 6 days. Feels like he has trapped air when he goes to hiccup   Gastroesophageal Reflux    Started out as burps then went to indigestion for about 6 days    Gastroesophageal Reflux He complains of abdominal pain, heartburn and nausea. He reports no chest pain, no coughing or no wheezing. Pertinent negatives include no weight loss.   Patient is in today for a 6 day hx of acid reflux, upper abdominal pressure, hiccups and poor appetite.  States he smokes marijuana daily. States he had gummies with THC last night and was able to sleep.  Visit to UC and prescribed pantoprazole daily.   Hx of DM, HTN, CAD, aortic aneurysm, smoker.    Health Maintenance Due  Topic Date Due   FOOT EXAM  06/04/2017   Diabetic kidney evaluation - Urine ACR  03/06/2022    Past Medical History:  Diagnosis Date   Benign localized prostatic hyperplasia with lower urinary tract symptoms (LUTS)    urologist--- dr Annabell Howells   Bladder cancer    urologist---- dr Annabell Howells---  s/p TURBT 11/2020; 03/ 2022   Coronary artery disease    mild   DDD (degenerative disc disease), cervical    From 1980s   Diabetes mellitus type II    followed by pcp--   (01-05-2021  per pt checks blood sugar three times per week,  fasting sugar --- 130--140_   Difficult intubation 07/28/2012   partial nephrectomy done  07-28-2012  found to have anterior larynx , diffcult intubation, referred to anesthesia record in epic    Dyslipidemia    ED (erectile dysfunction)    Elevated PSA    GERD (gastroesophageal reflux disease)    History of adenomatous polyp of colon    History of kidney stones    History of prostatitis 2008   History of renal carcinoma urologist--- dr Annabell Howells   07-28-2012 for renal neoplasm  s/p right partal  nephrectomy    Hypertension    followed by pcp   Osteoarthritis    Back   Renal carcinoma, right     Past Surgical History:  Procedure Laterality Date   CATARACT EXTRACTION     COLONOSCOPY N/A 03/03/2013   Procedure: COLONOSCOPY;  Surgeon: Hilarie Fredrickson, MD;  Location: [facility];  Service: Endoscopy;  Laterality: N/A;   COLONOSCOPY WITH PROPOFOL N/A 09/29/2019   Procedure: COLONOSCOPY WITH PROPOFOL;  Surgeon: Hilarie Fredrickson, MD;  Location: [facility];  Service: Endoscopy;  Laterality: N/A;   CYSTOSCOPY WITH BIOPSY N/A 12/09/2020   Procedure: CYSTOSCOPY WITH TURBT, BIOPSY AND FULGURATION;  Surgeon: Bjorn Pippin, MD;  Location: [facility];  Service: Urology;  Laterality: N/A;  REQUESTING 45 MINS   EXTRACORPOREAL SHOCK WAVE LITHOTRIPSY  01-10-2015;  03-03-2012     POLYPECTOMY  09/29/2019   Procedure: POLYPECTOMY;  Surgeon: Hilarie Fredrickson, MD;  Location: [facility];  Service: Endoscopy;;   ROBOT ASSISTED LAPAROSCOPIC PARTIAL NEPHRECTOMY  07-28-2012   DR Laverle Patter     TONSILLECTOMY  1965   TRANSURETHRAL RESECTION OF BLADDER TUMOR WITH MITOMYCIN-C N/A 01/06/2021   Procedure: TRANSURETHRAL RESECTION OF BLADDER TUMOR WITH GEMCITIBINE IN PACU;  Surgeon: Bjorn Pippin, MD;  Location: [facility];  Service: Urology;  Laterality:  N/A;   TRANSURETHRAL RESECTION OF BLADDER TUMOR WITH MITOMYCIN-C N/A 09/01/2021   Procedure: TRANSURETHRAL RESECTION OF BLADDER TUMOR WITH POST OPERATIVE INSTILLATION OF GEMCITABINE;  Surgeon: Bjorn Pippin, MD;  Location: [facility];  Service: Urology;  Laterality: N/A;   TRANSURETHRAL RESECTION OF BLADDER TUMOR WITH MITOMYCIN-C Bilateral 11/13/2022   Procedure: CYSTOSCOPY TRANSURETHRAL RESECTION OF BLADDER TUMOR BILATERAL RETROGRADES WITH INSTILL GEMCTABINE;  Surgeon: Bjorn Pippin, MD;  Location: [facility];  Service: Urology;  Laterality: Bilateral;  1 HR FOR CASE    Family History  Problem Relation Age of Onset   Lymphoma Mother    Colon  cancer Mother 52   Colon cancer Paternal Aunt    Esophageal cancer Neg Hx    Rectal cancer Neg Hx    Stomach cancer Neg Hx     Social History   Socioeconomic History   Marital status: Married    Spouse name: Not on file   Number of children: Not on file   Years of education: Not on file   Highest education level: Not on file  Occupational History   Not on file  Tobacco Use   Smoking status: Every Day    Packs/day: 0.25    Years: 30.00    Additional pack years: 0.00    Total pack years: 7.50    Types: Cigarettes   Smokeless tobacco: Never   Tobacco comments:    1 pack every 2 days  Vaping Use   Vaping Use: Never used  Substance and Sexual Activity   Alcohol use: Not Currently   Drug use: Yes    Types: Marijuana   Sexual activity: Yes  Other Topics Concern   Not on file  Social History Narrative   Occupation: printer   Current smoker   Regular exercise- no   Married         Social Determinants of Health   Financial Resource Strain: Low Risk  (08/30/2022)   Overall Financial Resource Strain (CARDIA)    Difficulty of Paying Living Expenses: Not hard at all  Food Insecurity: No Food Insecurity (08/30/2022)   Hunger Vital Sign    Worried About Running Out of Food in the Last Year: Never true    Ran Out of Food in the Last Year: Never true  Transportation Needs: No Transportation Needs (08/30/2022)   PRAPARE - Administrator, Civil Service (Medical): No    Lack of Transportation (Non-Medical): No  Physical Activity: Inactive (08/30/2022)   Exercise Vital Sign    Days of Exercise per Week: 0 days    Minutes of Exercise per Session: 0 min  Stress: No Stress Concern Present (08/30/2022)   [employer] of Occupational Health - Occupational Stress Questionnaire    Feeling of Stress : Not at all  Social Connections: Socially Integrated (08/30/2022)   Social Connection and Isolation Panel [NHANES]    Frequency of Communication with Friends and  Family: More than three times a week    Frequency of Social Gatherings with Friends and Family: More than three times a week    Attends Religious Services: More than 4 times per year    Active Member of [employer] or Organizations: Yes    Attends Banker Meetings: More than 4 times per year    Marital Status: Married  Catering manager Violence: Not At Risk (08/30/2022)   Humiliation, Afraid, Rape, and Kick questionnaire    Fear of Current or Ex-Partner: No    Emotionally Abused: No  Physically Abused: No    Sexually Abused: No    Outpatient Medications Prior to Visit  Medication Sig Dispense Refill   alfuzosin (UROXATRAL) 10 MG 24 hr tablet Take 10 mg by mouth daily.     amLODipine (NORVASC) 5 MG tablet Take 1 tablet (5 mg total) by mouth daily. 90 tablet 3   Ascorbic Acid (VITAMIN C) 1000 MG tablet Take 1,000 mg by mouth once a week.     aspirin EC 81 MG tablet Take 1 tablet (81 mg total) by mouth daily. 100 tablet 3   blood glucose meter kit and supplies KIT Use to test blood sugar once a day. DX: E11.9 1 each 0   Blood Glucose Monitoring Suppl (CONTOUR NEXT MONITOR) w/Device KIT 1 Device by Does not apply route daily. 1 kit 0   cholecalciferol (VITAMIN D) 1000 UNITS tablet Take 1,000 Units by mouth daily.     clobetasol (TEMOVATE) 0.05 % external solution Apply 1 Application topically 2 (two) times daily as needed. Itchy scalp 50 mL 3   CONTOUR NEXT TEST test strip USE AS DIRECTED 100 strip 5   Empagliflozin-metFORMIN HCl ER (SYNJARDY XR) 12.02-999 MG TB24 Take 1 tablet by mouth 2 (two) times daily. 180 tablet 3   finasteride (PROSCAR) 5 MG tablet TAKE 1 TABLET BY MOUTH EVERY DAY 90 tablet 3   glimepiride (AMARYL) 4 MG tablet Take 1 tablet (4 mg total) by mouth 2 (two) times daily. 60 tablet 11   ibuprofen (ADVIL) 800 MG tablet Take 800-1,600 mg by mouth daily as needed for moderate pain.     ketoconazole (NIZORAL) 2 % cream Apply 1 Application topically daily as needed for  irritation.     Lancets MISC Use to check blood sugar once per day. 100 each 3   olmesartan (BENICAR) 40 MG tablet TAKE 1 TABLET BY MOUTH EVERY DAY (Patient taking differently: Take 40 mg by mouth daily as needed (high bp).) 90 tablet 3   ondansetron (ZOFRAN-ODT) 4 MG disintegrating tablet Take 1 tablet (4 mg total) by mouth every 8 (eight) hours as needed for nausea or vomiting. 20 tablet 0   potassium chloride SA (KLOR-CON) 20 MEQ tablet Take 1 tablet (20 mEq total) by mouth daily. (Patient taking differently: Take 20 mEq by mouth 2 (two) times a week.) 90 tablet 11   repaglinide (PRANDIN) 2 MG tablet Take 2 mg by mouth 3 (three) times daily.     Semaglutide (RYBELSUS) 14 MG TABS Take 1 tablet (14 mg total) by mouth daily. 30 tablet 5   tadalafil (CIALIS) 5 MG tablet Take 5 mg by mouth daily.     pantoprazole (PROTONIX) 20 MG tablet Take 1 tablet (20 mg total) by mouth daily. 30 tablet 0   HYDROcodone-acetaminophen (NORCO/VICODIN) 5-325 MG tablet Take 1 tablet by mouth every 6 (six) hours as needed for moderate pain. (Patient not taking: Reported on 02/05/2023) 6 tablet 0   prednisoLONE acetate (PRED FORTE) 1 % ophthalmic suspension Place 1 drop into both eyes daily as needed (irritation). (Patient not taking: Reported on 02/05/2023)     Facility-Administered Medications Prior to Visit  Medication Dose Route Frequency Provider Last Rate Last Admin   gemcitabine (GEMZAR) chemo syringe for bladder instillation 2,000 mg  2,000 mg Bladder Instillation Once Bjorn Pippin, MD        Allergies  Allergen Reactions   Tamsulosin Other (See Comments)    Unable to perform sexually   Codeine Itching and Rash   Wound Dressing  Adhesive Itching and Other (See Comments)    Band-Aid adhesive    Review of Systems  Constitutional:  Negative for chills, fever and weight loss.  Respiratory:  Negative for cough, hemoptysis, shortness of breath and wheezing.   Cardiovascular:  Negative for chest pain,  palpitations and leg swelling.  Gastrointestinal:  Positive for abdominal pain, heartburn and nausea. Negative for blood in stool, constipation, diarrhea and vomiting.  Genitourinary:  Negative for dysuria, frequency and urgency.  Musculoskeletal:  Negative for back pain.  Neurological:  Negative for dizziness, focal weakness and headaches.       Objective:    Physical Exam Constitutional:      General: He is not in acute distress.    Appearance: He is not ill-appearing.     Comments: Hiccups throughout visit   HENT:     Mouth/Throat:     Mouth: Mucous membranes are moist.  Eyes:     Extraocular Movements: Extraocular movements intact.     Conjunctiva/sclera: Conjunctivae normal.  Cardiovascular:     Rate and Rhythm: Normal rate and regular rhythm.  Pulmonary:     Effort: Pulmonary effort is normal.     Breath sounds: Normal breath sounds.  Abdominal:     General: Bowel sounds are normal. There is no distension.     Palpations: Abdomen is soft.     Tenderness: There is no abdominal tenderness. There is no right CVA tenderness, left CVA tenderness, guarding or rebound.  Musculoskeletal:     Cervical back: Normal range of motion and neck supple.  Skin:    General: Skin is warm and dry.  Neurological:     General: No focal deficit present.     Mental Status: He is alert and oriented to person, place, and time.  Psychiatric:        Mood and Affect: Mood normal.        Behavior: Behavior normal.        Thought Content: Thought content normal.     BP 130/74 (BP Location: Left Arm, Patient Position: Sitting, Cuff Size: Large)   Pulse 64   Temp 97.8 F (36.6 C) (Temporal)   Ht 5\' 6"  (1.676 m)   Wt 188 lb (85.3 kg)   SpO2 98%   BMI 30.34 kg/m  Wt Readings from Last 3 Encounters:  02/05/23 188 lb (85.3 kg)  01/23/23 189 lb (85.7 kg)  12/17/22 192 lb (87.1 kg)       Assessment & Plan:   Problem List Items Addressed This Visit       Digestive   Gastroesophageal  reflux disease   Relevant Medications   pantoprazole (PROTONIX) 20 MG tablet     Other   EKG abnormalities   Relevant Orders   Troponin I (High Sensitivity) (Completed)   Hypokalemia   Intractable hiccups - Primary   Relevant Medications   chlorproMAZINE (THORAZINE) 10 MG tablet   Other Relevant Orders   EKG 12-Lead   CBC with Differential/Platelet (Completed)   Comprehensive metabolic panel (Completed)   DG Abd Acute W/Chest (Completed)   Upper abdominal pain   Relevant Orders   CBC with Differential/Platelet (Completed)   Comprehensive metabolic panel (Completed)   Lipase (Completed)   Amylase (Completed)   DG Abd Acute W/Chest (Completed)   Troponin I (High Sensitivity) (Completed)   EKG with overall poor quality due to persistent motion related to hiccups but it does show NSR, rate 68 with upsloping T waves in V1-V3 with new T wave  depression in V4-V6 compared to EKG in 10/2022.  Discussed that he is high risk for having an acute cardiac event. He smokes marijuana and had gummies more recently that could be masking symptoms. He is not in any acute distress. Denies chest pain. Abdominal exam is benign.  STAT troponin ordered. STAT abd acute with chest ordered.  CBC, CMP, lipase, amylase also ordered.  He will wait in the office for these results.   Results reviewed with patient and wife. Abdominal XR w/chest shows scattered stool but negative for acute cardiopulmonary disease.  Troponin negative. Amylase and lipase normal.  Potassium low- he has not been taking potassium supplement regularly and encouraged him to take this daily.  Thorazine prescribed every every 8 hours prn x 2 days.  Increase pantoprazole to twice daily.  Cut back smoking Follow up if not improving or call 911/go to the ED if any chest pain, shortness of breath, vomiting or worsening symptoms.  Follow up with PCP next week.     I have discontinued Janeece Riggers. Damaso's prednisoLONE acetate and  HYDROcodone-acetaminophen. I have also changed his pantoprazole. Additionally, I am having him start on chlorproMAZINE. Lastly, I am having him maintain his cholecalciferol, blood glucose meter kit and supplies, Lancets, vitamin C, Contour Next Monitor, Contour Next Test, tadalafil, potassium chloride SA, finasteride, Synjardy XR, olmesartan, alfuzosin, ketoconazole, aspirin EC, ibuprofen, glimepiride, repaglinide, amLODipine, Rybelsus, clobetasol, and ondansetron.  Meds ordered this encounter  Medications   chlorproMAZINE (THORAZINE) 10 MG tablet    Sig: Take 1 tablet (10 mg total) by mouth 3 (three) times daily as needed.    Dispense:  10 tablet    Refill:  0    Order Specific Question:   Supervising Provider    Answer:   Hillard Danker A [[id number]]   pantoprazole (PROTONIX) 20 MG tablet    Sig: Take 1 tablet (20 mg total) by mouth 2 (two) times daily before a meal.    Dispense:  60 tablet    Refill:  0    Order Specific Question:   Supervising Provider    Answer:   Hillard Danker A [[id number]]

## 2023-02-05 NOTE — Progress Notes (Signed)
Discussed in person.

## 2023-02-12 ENCOUNTER — Encounter: Payer: Self-pay | Admitting: Internal Medicine

## 2023-02-12 ENCOUNTER — Ambulatory Visit (INDEPENDENT_AMBULATORY_CARE_PROVIDER_SITE_OTHER): Payer: Medicare Other | Admitting: Internal Medicine

## 2023-02-12 VITALS — BP 148/90 | HR 71 | Temp 98.9°F | Ht 66.0 in | Wt 186.0 lb

## 2023-02-12 DIAGNOSIS — M79604 Pain in right leg: Secondary | ICD-10-CM

## 2023-02-12 DIAGNOSIS — R202 Paresthesia of skin: Secondary | ICD-10-CM

## 2023-02-12 DIAGNOSIS — F1220 Cannabis dependence, uncomplicated: Secondary | ICD-10-CM

## 2023-02-12 DIAGNOSIS — E118 Type 2 diabetes mellitus with unspecified complications: Secondary | ICD-10-CM | POA: Diagnosis not present

## 2023-02-12 DIAGNOSIS — R066 Hiccough: Secondary | ICD-10-CM

## 2023-02-12 DIAGNOSIS — I10 Essential (primary) hypertension: Secondary | ICD-10-CM | POA: Diagnosis not present

## 2023-02-12 DIAGNOSIS — R413 Other amnesia: Secondary | ICD-10-CM | POA: Diagnosis not present

## 2023-02-12 LAB — COMPREHENSIVE METABOLIC PANEL
ALT: 29 U/L (ref 0–53)
AST: 15 U/L (ref 0–37)
Albumin: 3.9 g/dL (ref 3.5–5.2)
Alkaline Phosphatase: 85 U/L (ref 39–117)
BUN: 12 mg/dL (ref 6–23)
CO2: 33 mEq/L — ABNORMAL HIGH (ref 19–32)
Calcium: 9.9 mg/dL (ref 8.4–10.5)
Chloride: 101 mEq/L (ref 96–112)
Creatinine, Ser: 0.94 mg/dL (ref 0.40–1.50)
GFR: 83.27 mL/min (ref >60.00)
Glucose, Bld: 145 mg/dL — ABNORMAL HIGH (ref 70–99)
Potassium: 3.6 mEq/L (ref 3.5–5.1)
Sodium: 140 mEq/L (ref 135–145)
Total Bilirubin: 0.5 mg/dL (ref 0.2–1.2)
Total Protein: 6.5 g/dL (ref 6.0–8.3)

## 2023-02-12 LAB — CBC WITH DIFFERENTIAL/PLATELET
Basophils Absolute: 0.1 10*3/uL (ref 0.0–0.1)
Basophils Relative: 1 % (ref 0.0–3.0)
Eosinophils Absolute: 0.3 10*3/uL (ref 0.0–0.7)
Eosinophils Relative: 2.7 % (ref 0.0–5.0)
HCT: 47.1 % (ref 39.0–52.0)
Hemoglobin: 16.2 g/dL (ref 13.0–17.0)
Lymphocytes Relative: 15.8 % (ref 12.0–46.0)
Lymphs Abs: 1.6 10*3/uL (ref 0.7–4.0)
MCHC: 34.4 g/dL (ref 30.0–36.0)
MCV: 86 fl (ref 78.0–100.0)
Monocytes Absolute: 0.9 10*3/uL (ref 0.1–1.0)
Monocytes Relative: 9.2 % (ref 3.0–12.0)
Neutro Abs: 7 10*3/uL (ref 1.4–7.7)
Neutrophils Relative %: 71.3 % (ref 43.0–77.0)
Platelets: 186 10*3/uL (ref 150.0–400.0)
RBC: 5.48 Mil/uL (ref 4.22–5.81)
RDW: 14.5 % (ref 11.5–15.5)
WBC: 9.9 10*3/uL (ref 4.0–10.5)

## 2023-02-12 LAB — VITAMIN B12: Vitamin B-12: 584 pg/mL (ref 211–911)

## 2023-02-12 MED ORDER — RYBELSUS 7 MG PO TABS
1.0000 | ORAL_TABLET | Freq: Every day | ORAL | 11 refills | Status: DC
Start: 2023-02-12 — End: 2023-03-27

## 2023-02-12 NOTE — Assessment & Plan Note (Signed)
Probable radiculopathy Medrol pack Move more, ROM exercise

## 2023-02-12 NOTE — Assessment & Plan Note (Signed)
Pt cut back on weed by 50 % - memory is much better

## 2023-02-12 NOTE — Assessment & Plan Note (Signed)
Pt cut back on weed by 50 % - memory is much better 

## 2023-02-12 NOTE — Assessment & Plan Note (Signed)
CBD gummy with D8 helped

## 2023-02-12 NOTE — Assessment & Plan Note (Signed)
On Synjardy, Rubelsus, Prandin  Increase Rybelsus to 7 mg/d 10/2022 (pt had gastroparesis sx's on Rybelsus 14 mg/d) Reduce Rybelsus to 7 mg/d

## 2023-02-12 NOTE — Assessment & Plan Note (Signed)
SBP 130 at home 

## 2023-02-12 NOTE — Progress Notes (Signed)
Subjective:  Patient ID: [name] [name], male    DOB: 06-29-1956  Age: 67 y.o. MRN: [id number]  CC: Gastroesophageal Reflux   HPI [name] presents for GERD, n/v and wt loss on Rybelsus  14 mg/d, (pt had gastroparesis sx's on Rybelsus 14 mg/d). F/u on DM, HTN Pt cut back on weed by 50 % - memory is much better    He is here w/wife - [name] [name]  Outpatient Medications Prior to Visit  Medication Sig Dispense Refill   alfuzosin (UROXATRAL) 10 MG 24 hr tablet Take 10 mg by mouth daily.     amLODipine (NORVASC) 5 MG tablet Take 1 tablet (5 mg total) by mouth daily. 90 tablet 3   Ascorbic Acid (VITAMIN C) 1000 MG tablet Take 1,000 mg by mouth once a week.     aspirin EC 81 MG tablet Take 1 tablet (81 mg total) by mouth daily. 100 tablet 3   blood glucose meter kit and supplies KIT Use to test blood sugar once a day. DX: E11.9 1 each 0   Blood Glucose Monitoring Suppl (CONTOUR NEXT MONITOR) w/Device KIT 1 Device by Does not apply route daily. 1 kit 0   chlorproMAZINE (THORAZINE) 10 MG tablet Take 1 tablet (10 mg total) by mouth 3 (three) times daily as needed. 10 tablet 0   cholecalciferol (VITAMIN D) 1000 UNITS tablet Take 1,000 Units by mouth daily.     clobetasol (TEMOVATE) 0.05 % external solution Apply 1 Application topically 2 (two) times daily as needed. Itchy scalp 50 mL 3   CONTOUR NEXT TEST test strip USE AS DIRECTED 100 strip 5   Empagliflozin-metFORMIN HCl ER (SYNJARDY XR) 12.02-999 MG TB24 Take 1 tablet by mouth 2 (two) times daily. 180 tablet 3   finasteride (PROSCAR) 5 MG tablet TAKE 1 TABLET BY MOUTH EVERY DAY 90 tablet 3   glimepiride (AMARYL) 4 MG tablet Take 1 tablet (4 mg total) by mouth 2 (two) times daily. 60 tablet 11   ibuprofen (ADVIL) 800 MG tablet Take 800-1,600 mg by mouth daily as needed for moderate pain.     ketoconazole (NIZORAL) 2 % cream Apply 1 Application topically daily as needed for irritation.     Lancets MISC Use to check blood sugar once per  day. 100 each 3   olmesartan (BENICAR) 40 MG tablet TAKE 1 TABLET BY MOUTH EVERY DAY (Patient taking differently: Take 40 mg by mouth daily as needed (high bp).) 90 tablet 3   ondansetron (ZOFRAN-ODT) 4 MG disintegrating tablet Take 1 tablet (4 mg total) by mouth every 8 (eight) hours as needed for nausea or vomiting. 20 tablet 0   pantoprazole (PROTONIX) 20 MG tablet Take 1 tablet (20 mg total) by mouth 2 (two) times daily before a meal. 60 tablet 0   potassium chloride SA (KLOR-CON) 20 MEQ tablet Take 1 tablet (20 mEq total) by mouth daily. (Patient taking differently: Take 20 mEq by mouth 2 (two) times a week.) 90 tablet 11   repaglinide (PRANDIN) 2 MG tablet Take 2 mg by mouth 3 (three) times daily.     tadalafil (CIALIS) 5 MG tablet Take 5 mg by mouth daily.     Semaglutide (RYBELSUS) 14 MG TABS Take 1 tablet (14 mg total) by mouth daily. 30 tablet 5   Facility-Administered Medications Prior to Visit  Medication Dose Route Frequency Provider Last Rate Last Admin   gemcitabine (GEMZAR) chemo syringe for bladder instillation 2,000 mg  2,000 mg Bladder Instillation Once  Bjorn Pippin, MD        ROS: Review of Systems  Constitutional:  Negative for appetite change, fatigue and unexpected weight change.  HENT:  Negative for congestion, nosebleeds, sneezing, sore throat and trouble swallowing.   Eyes:  Negative for itching and visual disturbance.  Respiratory:  Negative for cough.   Cardiovascular:  Negative for chest pain, palpitations and leg swelling.  Gastrointestinal:  Negative for abdominal distention, blood in stool, diarrhea and nausea.  Genitourinary:  Negative for frequency and hematuria.  Musculoskeletal:  Negative for back pain, gait problem, joint swelling and neck pain.  Skin:  Negative for rash.  Neurological:  Negative for dizziness, tremors, speech difficulty and weakness.  Psychiatric/Behavioral:  Positive for decreased concentration. Negative for agitation, dysphoric mood  and sleep disturbance. The patient is not nervous/anxious.     Objective:  BP (!) 148/90 (BP Location: Right Arm, Patient Position: Sitting, Cuff Size: Normal)   Pulse 71   Temp 98.9 F (37.2 C) (Oral)   Ht 5\' 6"  (1.676 m)   Wt 186 lb (84.4 kg)   SpO2 95%   BMI 30.02 kg/m   BP Readings from Last 3 Encounters:  02/12/23 (!) 148/90  02/05/23 130/74  02/02/23 (!) 160/95    Wt Readings from Last 3 Encounters:  02/12/23 186 lb (84.4 kg)  02/05/23 188 lb (85.3 kg)  01/23/23 189 lb (85.7 kg)    Physical Exam Constitutional:      General: He is not in acute distress.    Appearance: He is well-developed. He is obese.     Comments: NAD  Eyes:     Conjunctiva/sclera: Conjunctivae normal.     Pupils: Pupils are equal, round, and reactive to light.  Neck:     Thyroid: No thyromegaly.     Vascular: No JVD.  Cardiovascular:     Rate and Rhythm: Normal rate and regular rhythm.     Heart sounds: Normal heart sounds. No murmur heard.    No friction rub. No gallop.  Pulmonary:     Effort: Pulmonary effort is normal. No respiratory distress.     Breath sounds: Normal breath sounds. No wheezing or rales.  Chest:     Chest wall: No tenderness.  Abdominal:     General: Bowel sounds are normal. There is no distension.     Palpations: Abdomen is soft. There is no mass.     Tenderness: There is no abdominal tenderness. There is no guarding or rebound.  Musculoskeletal:        General: No tenderness. Normal range of motion.     Cervical back: Normal range of motion.  Lymphadenopathy:     Cervical: No cervical adenopathy.  Skin:    General: Skin is warm and dry.     Findings: No rash.  Neurological:     Mental Status: He is alert and oriented to person, place, and time.     Cranial Nerves: No cranial nerve deficit.     Motor: No abnormal muscle tone.     Coordination: Coordination normal.     Gait: Gait normal.     Deep Tendon Reflexes: Reflexes are normal and symmetric.   Psychiatric:        Behavior: Behavior normal.        Thought Content: Thought content normal.        Judgment: Judgment normal.     Lab Results  Component Value Date   WBC 8.6 02/05/2023   HGB 15.6 02/05/2023  HCT 45.3 02/05/2023   PLT 154.0 02/05/2023   GLUCOSE 191 (H) 02/05/2023   CHOL 153 03/06/2021   TRIG 170.0 (H) 03/06/2021   HDL 33.50 (L) 03/06/2021   LDLCALC 86 03/06/2021   ALT 24 02/05/2023   AST 11 02/05/2023   NA 141 02/05/2023   K 3.2 (L) 02/05/2023   CL 99 02/05/2023   CREATININE 0.95 02/05/2023   BUN 15 02/05/2023   CO2 36 (H) 02/05/2023   TSH 0.64 03/06/2021   PSA 3.87 03/06/2021   INR 1.1 (H) 01/08/2012   HGBA1C 7.9 (H) 01/22/2023   MICROALBUR 1.3 03/06/2021    No results found.  Assessment & Plan:   Problem List Items Addressed This Visit     DM type 2, controlled, with complication (HCC) - Primary    On Synjardy, Rubelsus, Prandin  Increase Rybelsus to 7 mg/d 10/2022 (pt had gastroparesis sx's on Rybelsus 14 mg/d) Reduce Rybelsus to 7 mg/d      Relevant Medications   Semaglutide (RYBELSUS) 7 MG TABS   Essential hypertension    SBP 130 at home      Leg pain, anterior, right    Probable radiculopathy Medrol pack Move more, ROM exercise      Memory problem    Pt cut back on weed by 50 % - memory is much better      Tetrahydrocannabinol (THC) use disorder, moderate, dependence (HCC)    Pt cut back on weed by 50 % - memory is much better      Intractable hiccups    CBD gummy with D8 helped         Meds ordered this encounter  Medications   Semaglutide (RYBELSUS) 7 MG TABS    Sig: Take 1 tablet (7 mg total) by mouth daily. Take 30 min ac    Dispense:  30 tablet    Refill:  11      Follow-up: No follow-ups on file.  Sonda Primes, MD

## 2023-02-13 LAB — HEMOGLOBIN A1C: Hgb A1c MFr Bld: 7.8 % — ABNORMAL HIGH (ref 4.6–6.5)

## 2023-03-06 ENCOUNTER — Other Ambulatory Visit: Payer: Self-pay | Admitting: Family Medicine

## 2023-03-06 DIAGNOSIS — N3000 Acute cystitis without hematuria: Secondary | ICD-10-CM | POA: Diagnosis not present

## 2023-03-06 DIAGNOSIS — Z8551 Personal history of malignant neoplasm of bladder: Secondary | ICD-10-CM | POA: Diagnosis not present

## 2023-03-06 DIAGNOSIS — K219 Gastro-esophageal reflux disease without esophagitis: Secondary | ICD-10-CM

## 2023-03-15 DIAGNOSIS — R1084 Generalized abdominal pain: Secondary | ICD-10-CM | POA: Diagnosis not present

## 2023-03-15 DIAGNOSIS — C673 Malignant neoplasm of anterior wall of bladder: Secondary | ICD-10-CM | POA: Diagnosis not present

## 2023-03-26 ENCOUNTER — Telehealth: Payer: Self-pay | Admitting: Internal Medicine

## 2023-03-26 NOTE — Telephone Encounter (Signed)
Patient called and said he cannot afford his Semaglutide (RYBELSUS) 7 MG TABS. He would like to know if Dr. Posey Rea would recommend anything else or if he has any suggestions. He would like a call back at [phone].

## 2023-03-27 ENCOUNTER — Encounter: Payer: Self-pay | Admitting: Internal Medicine

## 2023-03-27 NOTE — Telephone Encounter (Signed)
All alternatives to Rybelsus are expensive.  Thanks

## 2023-03-27 NOTE — Addendum Note (Signed)
Addended by: Tresa Garter on: 03/27/2023 07:09 AM   Modules accepted: Orders

## 2023-03-27 NOTE — Telephone Encounter (Signed)
Notified pt w/MD response. Pt is taking his Repaglinide and glimepiride.Marland KitchenRaechel Chute

## 2023-03-27 NOTE — Addendum Note (Signed)
Addended by: Deatra James on: 03/27/2023 09:21 AM   Modules accepted: Orders

## 2023-04-22 ENCOUNTER — Other Ambulatory Visit: Payer: Self-pay | Admitting: Internal Medicine

## 2023-04-22 ENCOUNTER — Other Ambulatory Visit (INDEPENDENT_AMBULATORY_CARE_PROVIDER_SITE_OTHER): Payer: Medicare Other

## 2023-04-22 DIAGNOSIS — E118 Type 2 diabetes mellitus with unspecified complications: Secondary | ICD-10-CM | POA: Diagnosis not present

## 2023-04-22 LAB — BASIC METABOLIC PANEL
BUN: 17 mg/dL (ref 6–23)
CO2: 28 mEq/L (ref 19–32)
Calcium: 9.6 mg/dL (ref 8.4–10.5)
Chloride: 103 mEq/L (ref 96–112)
Creatinine, Ser: 0.92 mg/dL (ref 0.40–1.50)
GFR: 85.33 mL/min (ref >60.00)
Glucose, Bld: 234 mg/dL — ABNORMAL HIGH (ref 70–99)
Potassium: 4.2 mEq/L (ref 3.5–5.1)
Sodium: 139 mEq/L (ref 135–145)

## 2023-04-22 LAB — HEMOGLOBIN A1C: Hgb A1c MFr Bld: 8.5 % — ABNORMAL HIGH (ref 4.6–6.5)

## 2023-04-23 ENCOUNTER — Ambulatory Visit (INDEPENDENT_AMBULATORY_CARE_PROVIDER_SITE_OTHER): Payer: Medicare Other | Admitting: Internal Medicine

## 2023-04-23 ENCOUNTER — Encounter: Payer: Self-pay | Admitting: Internal Medicine

## 2023-04-23 VITALS — BP 130/82 | HR 82 | Temp 98.0°F | Ht 66.0 in | Wt 190.0 lb

## 2023-04-23 DIAGNOSIS — E785 Hyperlipidemia, unspecified: Secondary | ICD-10-CM

## 2023-04-23 DIAGNOSIS — C679 Malignant neoplasm of bladder, unspecified: Secondary | ICD-10-CM

## 2023-04-23 DIAGNOSIS — E1142 Type 2 diabetes mellitus with diabetic polyneuropathy: Secondary | ICD-10-CM

## 2023-04-23 DIAGNOSIS — Z5320 Procedure and treatment not carried out because of patient's decision for unspecified reasons: Secondary | ICD-10-CM | POA: Diagnosis not present

## 2023-04-23 DIAGNOSIS — E1140 Type 2 diabetes mellitus with diabetic neuropathy, unspecified: Secondary | ICD-10-CM | POA: Insufficient documentation

## 2023-04-23 DIAGNOSIS — I10 Essential (primary) hypertension: Secondary | ICD-10-CM | POA: Diagnosis not present

## 2023-04-23 DIAGNOSIS — E118 Type 2 diabetes mellitus with unspecified complications: Secondary | ICD-10-CM

## 2023-04-23 DIAGNOSIS — Z7984 Long term (current) use of oral hypoglycemic drugs: Secondary | ICD-10-CM | POA: Diagnosis not present

## 2023-04-23 MED ORDER — METFORMIN HCL 500 MG PO TABS: 500.0000 mg | ORAL_TABLET | Freq: Every day | ORAL | 3 refills | Status: DC

## 2023-04-23 MED ORDER — GLIMEPIRIDE 4 MG PO TABS: 4.0000 mg | ORAL_TABLET | Freq: Two times a day (BID) | ORAL | 3 refills | Status: DC

## 2023-04-23 NOTE — Assessment & Plan Note (Addendum)
2022 Coronary CT calcium score of 27.6.   Pt declined statins On fish oil  On Synjardy - d/c Prandin - d/c Increase Rybelsus to 7 mg/d 10/2022 (pt had gastroparesis sx's on Rybelsus 14 mg/d) Rybelsus 7 mg/d -not covered, stopped, then re-started On Amaryl 2 mg bid 04/2023 CBG>200 Increase Amaryl to 4 mg bid Added Metformin 500 mg/d Cont Rybelsus 7 mg/day Refusing injections

## 2023-04-23 NOTE — Assessment & Plan Note (Signed)
2022 Coronary CT calcium score of 27.6.   Pt declined statins On fish oil 

## 2023-04-23 NOTE — Progress Notes (Signed)
Subjective:  Patient ID: [name] [name], male    DOB: 09-15-1956  Age: 67 y.o. MRN: [id number]  CC: Follow-up (3 mnth f/u, back pain )   HPI [name] [name] presents for DM, memory issues, HTN CBGs>200 a lot.Marland KitchenMarland KitchenC/o hand numbness  Outpatient Medications Prior to Visit  Medication Sig Dispense Refill   alfuzosin (UROXATRAL) 10 MG 24 hr tablet Take 10 mg by mouth daily.     amLODipine (NORVASC) 5 MG tablet Take 1 tablet (5 mg total) by mouth daily. 90 tablet 3   Ascorbic Acid (VITAMIN C) 1000 MG tablet Take 1,000 mg by mouth once a week.     aspirin EC 81 MG tablet Take 1 tablet (81 mg total) by mouth daily. 100 tablet 3   blood glucose meter kit and supplies KIT Use to test blood sugar once a day. DX: E11.9 1 each 0   Blood Glucose Monitoring Suppl (CONTOUR NEXT MONITOR) w/Device KIT 1 Device by Does not apply route daily. 1 kit 0   chlorproMAZINE (THORAZINE) 10 MG tablet Take 1 tablet (10 mg total) by mouth 3 (three) times daily as needed. 10 tablet 0   cholecalciferol (VITAMIN D) 1000 UNITS tablet Take 1,000 Units by mouth daily.     clobetasol (TEMOVATE) 0.05 % external solution Apply 1 Application topically 2 (two) times daily as needed. Itchy scalp 50 mL 3   CONTOUR NEXT TEST test strip USE AS DIRECTED 100 strip 5   finasteride (PROSCAR) 5 MG tablet TAKE 1 TABLET BY MOUTH EVERY DAY 90 tablet 3   ibuprofen (ADVIL) 800 MG tablet Take 800-1,600 mg by mouth daily as needed for moderate pain.     ketoconazole (NIZORAL) 2 % cream Apply 1 Application topically daily as needed for irritation.     Lancets MISC Use to check blood sugar once per day. 100 each 3   olmesartan (BENICAR) 40 MG tablet TAKE 1 TABLET BY MOUTH EVERY DAY (Patient taking differently: Take 40 mg by mouth daily as needed (high bp).) 90 tablet 3   ondansetron (ZOFRAN-ODT) 4 MG disintegrating tablet Take 1 tablet (4 mg total) by mouth every 8 (eight) hours as needed for nausea or vomiting. 20 tablet 0   pantoprazole  (PROTONIX) 20 MG tablet TAKE 1 TABLET BY MOUTH TWICE A DAY BEFORE A MEAL 180 tablet 1   potassium chloride SA (KLOR-CON) 20 MEQ tablet Take 1 tablet (20 mEq total) by mouth daily. (Patient taking differently: Take 20 mEq by mouth 2 (two) times a week.) 90 tablet 11   RYBELSUS 14 MG TABS Take 1 tablet by mouth daily.     tadalafil (CIALIS) 5 MG tablet Take 5 mg by mouth daily.     glimepiride (AMARYL) 4 MG tablet Take 1 tablet (4 mg total) by mouth 2 (two) times daily. 60 tablet 11   repaglinide (PRANDIN) 2 MG tablet Take 2 mg by mouth 3 (three) times daily.     Facility-Administered Medications Prior to Visit  Medication Dose Route Frequency Provider Last Rate Last Admin   gemcitabine (GEMZAR) chemo syringe for bladder instillation 2,000 mg  2,000 mg Bladder Instillation Once Bjorn Pippin, MD        ROS: Review of Systems  Constitutional:  Positive for unexpected weight change. Negative for appetite change and fatigue.  HENT:  Negative for congestion, nosebleeds, sneezing, sore throat and trouble swallowing.   Eyes:  Negative for itching and visual disturbance.  Respiratory:  Negative for cough.   Cardiovascular:  Negative for chest pain, palpitations and leg swelling.  Gastrointestinal:  Negative for abdominal distention, blood in stool, diarrhea and nausea.  Genitourinary:  Negative for frequency and hematuria.  Musculoskeletal:  Negative for back pain, gait problem, joint swelling and neck pain.  Skin:  Negative for rash.  Neurological:  Negative for dizziness, tremors, speech difficulty and weakness.  Hematological:  Does not bruise/bleed easily.  Psychiatric/Behavioral:  Negative for agitation, dysphoric mood, sleep disturbance and suicidal ideas. The patient is not nervous/anxious.     Objective:  BP 130/82 (BP Location: Left Arm, Patient Position: Sitting, Cuff Size: Large)   Pulse 82   Temp 98 F (36.7 C) (Oral)   Ht 5\' 6"  (1.676 m)   Wt 190 lb (86.2 kg)   SpO2 96%   BMI  30.67 kg/m   BP Readings from Last 3 Encounters:  04/23/23 130/82  02/12/23 (!) 148/90  02/05/23 130/74    Wt Readings from Last 3 Encounters:  04/23/23 190 lb (86.2 kg)  02/12/23 186 lb (84.4 kg)  02/05/23 188 lb (85.3 kg)    Physical Exam Constitutional:      General: He is not in acute distress.    Appearance: He is well-developed. He is obese.     Comments: NAD  Eyes:     Conjunctiva/sclera: Conjunctivae normal.     Pupils: Pupils are equal, round, and reactive to light.  Neck:     Thyroid: No thyromegaly.     Vascular: No JVD.  Cardiovascular:     Rate and Rhythm: Normal rate and regular rhythm.     Heart sounds: Normal heart sounds. No murmur heard.    No friction rub. No gallop.  Pulmonary:     Effort: Pulmonary effort is normal. No respiratory distress.     Breath sounds: Normal breath sounds. No wheezing or rales.  Chest:     Chest wall: No tenderness.  Abdominal:     General: Bowel sounds are normal. There is no distension.     Palpations: Abdomen is soft. There is no mass.     Tenderness: There is no abdominal tenderness. There is no guarding or rebound.  Musculoskeletal:        General: No tenderness. Normal range of motion.     Cervical back: Normal range of motion. No tenderness.     Right lower leg: No edema.     Left lower leg: No edema.  Lymphadenopathy:     Cervical: No cervical adenopathy.  Skin:    General: Skin is warm and dry.     Findings: No rash.  Neurological:     Mental Status: He is alert and oriented to person, place, and time.     Cranial Nerves: No cranial nerve deficit.     Motor: No abnormal muscle tone.     Coordination: Coordination normal.     Gait: Gait normal.     Deep Tendon Reflexes: Reflexes are normal and symmetric.  Psychiatric:        Behavior: Behavior normal.        Thought Content: Thought content normal.        Judgment: Judgment normal.     Lab Results  Component Value Date   WBC 9.9 02/12/2023   HGB  16.2 02/12/2023   HCT 47.1 02/12/2023   PLT 186.0 02/12/2023   GLUCOSE 234 (H) 04/22/2023   CHOL 153 03/06/2021   TRIG 170.0 (H) 03/06/2021   HDL 33.50 (L) 03/06/2021   LDLCALC 86 03/06/2021  ALT 29 02/12/2023   AST 15 02/12/2023   NA 139 04/22/2023   K 4.2 04/22/2023   CL 103 04/22/2023   CREATININE 0.92 04/22/2023   BUN 17 04/22/2023   CO2 28 04/22/2023   TSH 0.64 03/06/2021   PSA 3.87 03/06/2021   INR 1.1 (H) 01/08/2012   HGBA1C 8.5 (H) 04/22/2023   MICROALBUR 1.3 03/06/2021    No results found.  Assessment & Plan:   Problem List Items Addressed This Visit     DM type 2, controlled, with complication (HCC) - Primary    2022 Coronary CT calcium score of 27.6.   Pt declined statins On fish oil  On Synjardy - d/c Prandin - d/c Increase Rybelsus to 7 mg/d 10/2022 (pt had gastroparesis sx's on Rybelsus 14 mg/d) Rybelsus 7 mg/d -not covered, stopped, then re-started On Amaryl 2 mg bid 04/2023 CBG>200 Increase Amaryl to 4 mg bid Added Metformin 500 mg/d Cont Rybelsus 7 mg/day Refusing injections      Relevant Medications   RYBELSUS 14 MG TABS   metFORMIN (GLUCOPHAGE) 500 MG tablet   glimepiride (AMARYL) 4 MG tablet   Other Relevant Orders   Comprehensive metabolic panel   Hemoglobin A1c   Essential hypertension    BP Readings from Last 3 Encounters:  04/23/23 130/82  02/12/23 (!) 148/90  02/05/23 130/74        Relevant Orders   Comprehensive metabolic panel   Hemoglobin A1c   Dyslipidemia    2022 Coronary CT calcium score of 27.6.   Pt declined statins On fish oil      Statin declined    2022 Coronary CT calcium score of 27.6.   Pt declined statins On fish oil      Bladder cancer (HCC)    F/u w/Dr Annabell Howells       Diabetic neuropathy (HCC)    Poor DM control Increase Amaryl to 4 mg bid Added Metformin 500 mg/d Cont Rybelsus 7 mg/day Refusing injections      Relevant Medications   RYBELSUS 14 MG TABS   metFORMIN (GLUCOPHAGE) 500 MG  tablet   glimepiride (AMARYL) 4 MG tablet      Meds ordered this encounter  Medications   metFORMIN (GLUCOPHAGE) 500 MG tablet    Sig: Take 1 tablet (500 mg total) by mouth daily with breakfast.    Dispense:  90 tablet    Refill:  3   glimepiride (AMARYL) 4 MG tablet    Sig: Take 1 tablet (4 mg total) by mouth in the morning and at bedtime.    Dispense:  180 tablet    Refill:  3      Follow-up: Return in about 3 months (around 07/24/2023) for a follow-up visit.  Sonda Primes, MD

## 2023-04-23 NOTE — Assessment & Plan Note (Signed)
Poor DM control Increase Amaryl to 4 mg bid Added Metformin 500 mg/d Cont Rybelsus 7 mg/day Refusing injections

## 2023-04-23 NOTE — Assessment & Plan Note (Signed)
2022 Coronary CT calcium score of 27.6.   Pt declined statins On fish oil

## 2023-04-23 NOTE — Assessment & Plan Note (Signed)
F/u w/Dr Wrenn 

## 2023-04-23 NOTE — Patient Instructions (Signed)
Increase Amaryl to 4 mg twice a day Add Metformin 500 mg/day Continue Rybelsus 7 mg/day

## 2023-04-23 NOTE — Assessment & Plan Note (Signed)
BP Readings from Last 3 Encounters:  04/23/23 130/82  02/12/23 (!) 148/90  02/05/23 130/74

## 2023-04-27 ENCOUNTER — Encounter: Payer: Self-pay | Admitting: Internal Medicine

## 2023-04-30 ENCOUNTER — Encounter: Payer: Self-pay | Admitting: Internal Medicine

## 2023-04-30 ENCOUNTER — Ambulatory Visit (INDEPENDENT_AMBULATORY_CARE_PROVIDER_SITE_OTHER): Payer: Medicare Other | Admitting: Internal Medicine

## 2023-04-30 ENCOUNTER — Ambulatory Visit (INDEPENDENT_AMBULATORY_CARE_PROVIDER_SITE_OTHER): Payer: Medicare Other

## 2023-04-30 VITALS — BP 150/118 | HR 71 | Temp 98.2°F | Ht 66.0 in | Wt 191.0 lb

## 2023-04-30 DIAGNOSIS — E1142 Type 2 diabetes mellitus with diabetic polyneuropathy: Secondary | ICD-10-CM | POA: Diagnosis not present

## 2023-04-30 DIAGNOSIS — Z7984 Long term (current) use of oral hypoglycemic drugs: Secondary | ICD-10-CM | POA: Diagnosis not present

## 2023-04-30 DIAGNOSIS — M4722 Other spondylosis with radiculopathy, cervical region: Secondary | ICD-10-CM | POA: Diagnosis not present

## 2023-04-30 DIAGNOSIS — E118 Type 2 diabetes mellitus with unspecified complications: Secondary | ICD-10-CM | POA: Diagnosis not present

## 2023-04-30 DIAGNOSIS — M5412 Radiculopathy, cervical region: Secondary | ICD-10-CM

## 2023-04-30 DIAGNOSIS — M4802 Spinal stenosis, cervical region: Secondary | ICD-10-CM | POA: Diagnosis not present

## 2023-04-30 MED ORDER — METHYLPREDNISOLONE 4 MG PO TBPK
ORAL_TABLET | ORAL | 0 refills | Status: DC
Start: 2023-04-30 — End: 2023-05-14

## 2023-04-30 MED ORDER — CYCLOBENZAPRINE HCL 5 MG PO TABS: 5.0000 mg | ORAL_TABLET | Freq: Three times a day (TID) | ORAL | 1 refills | Status: DC | PRN

## 2023-04-30 MED ORDER — HYDROCODONE-ACETAMINOPHEN 10-325 MG PO TABS: 1.0000 | ORAL_TABLET | Freq: Four times a day (QID) | ORAL | 0 refills | Status: AC | PRN

## 2023-04-30 NOTE — Assessment & Plan Note (Signed)
Cont other Rx Increased Amaryl to 4 mg bid before (max dose)

## 2023-04-30 NOTE — Progress Notes (Signed)
Subjective:  Patient ID: [name] [name], male    DOB: Dec 17, 1955  Age: 67 y.o. MRN: [id number]  CC: Back Pain   HPI [name] [name] presents for mid back pain since 04/24/23. Pain is now moving to the R shoulder. Pt saw Dr [name] [name] and had a CT. He was told pain was MSK. He was cleaning his garage and moving large pieces a month ago... Taking ES Tylenol 4 at the time twice a day w/Ibuprofen 400 mg w/it... Pain is severe 7-8/10 now in the R posterior shoulder and tingling going down R arm/R pinky. No LBP now  F/u on DM  Outpatient Medications Prior to Visit  Medication Sig Dispense Refill   alfuzosin (UROXATRAL) 10 MG 24 hr tablet Take 10 mg by mouth daily.     amLODipine (NORVASC) 5 MG tablet Take 1 tablet (5 mg total) by mouth daily. 90 tablet 3   Ascorbic Acid (VITAMIN C) 1000 MG tablet Take 1,000 mg by mouth once a week.     aspirin EC 81 MG tablet Take 1 tablet (81 mg total) by mouth daily. 100 tablet 3   blood glucose meter kit and supplies KIT Use to test blood sugar once a day. DX: E11.9 1 each 0   Blood Glucose Monitoring Suppl (CONTOUR NEXT MONITOR) w/Device KIT 1 Device by Does not apply route daily. 1 kit 0   chlorproMAZINE (THORAZINE) 10 MG tablet Take 1 tablet (10 mg total) by mouth 3 (three) times daily as needed. 10 tablet 0   cholecalciferol (VITAMIN D) 1000 UNITS tablet Take 1,000 Units by mouth daily.     clobetasol (TEMOVATE) 0.05 % external solution Apply 1 Application topically 2 (two) times daily as needed. Itchy scalp 50 mL 3   CONTOUR NEXT TEST test strip USE AS DIRECTED 100 strip 5   finasteride (PROSCAR) 5 MG tablet TAKE 1 TABLET BY MOUTH EVERY DAY 90 tablet 3   glimepiride (AMARYL) 4 MG tablet Take 1 tablet (4 mg total) by mouth in the morning and at bedtime. 180 tablet 3   ibuprofen (ADVIL) 800 MG tablet Take 800-1,600 mg by mouth daily as needed for moderate pain.     ketoconazole (NIZORAL) 2 % cream Apply 1 Application topically daily as needed for  irritation.     Lancets MISC Use to check blood sugar once per day. 100 each 3   metFORMIN (GLUCOPHAGE) 500 MG tablet Take 1 tablet (500 mg total) by mouth daily with breakfast. 90 tablet 3   olmesartan (BENICAR) 40 MG tablet TAKE 1 TABLET BY MOUTH EVERY DAY (Patient taking differently: Take 40 mg by mouth daily as needed (high bp).) 90 tablet 3   ondansetron (ZOFRAN-ODT) 4 MG disintegrating tablet Take 1 tablet (4 mg total) by mouth every 8 (eight) hours as needed for nausea or vomiting. 20 tablet 0   pantoprazole (PROTONIX) 20 MG tablet TAKE 1 TABLET BY MOUTH TWICE A DAY BEFORE A MEAL 180 tablet 1   potassium chloride SA (KLOR-CON) 20 MEQ tablet Take 1 tablet (20 mEq total) by mouth daily. (Patient taking differently: Take 20 mEq by mouth 2 (two) times a week.) 90 tablet 11   RYBELSUS 14 MG TABS Take 1 tablet by mouth daily.     tadalafil (CIALIS) 5 MG tablet Take 5 mg by mouth daily.     Facility-Administered Medications Prior to Visit  Medication Dose Route Frequency Provider Last Rate Last Admin   gemcitabine (GEMZAR) chemo syringe for bladder instillation 2,000  mg  2,000 mg Bladder Instillation Once Bjorn Pippin, MD        ROS: Review of Systems  Constitutional:  Negative for appetite change, fatigue and unexpected weight change.  HENT:  Negative for congestion, nosebleeds, sneezing, sore throat and trouble swallowing.   Eyes:  Negative for itching and visual disturbance.  Respiratory:  Negative for cough.   Cardiovascular:  Negative for chest pain, palpitations and leg swelling.  Gastrointestinal:  Negative for abdominal distention, blood in stool, diarrhea and nausea.  Genitourinary:  Negative for frequency and hematuria.  Musculoskeletal:  Positive for neck pain and neck stiffness. Negative for back pain, gait problem and joint swelling.  Skin:  Negative for rash.  Neurological:  Negative for dizziness, tremors, speech difficulty and weakness.  Psychiatric/Behavioral:  Negative for  agitation, dysphoric mood and sleep disturbance. The patient is not nervous/anxious.     Objective:  BP (!) 150/118 (BP Location: Right Arm, Patient Position: Sitting, Cuff Size: Normal)   Pulse 71   Temp 98.2 F (36.8 C) (Oral)   Ht 5\' 6"  (1.676 m)   Wt 191 lb (86.6 kg)   SpO2 98%   BMI 30.83 kg/m   BP Readings from Last 3 Encounters:  04/30/23 (!) 150/118  04/23/23 130/82  02/12/23 (!) 148/90    Wt Readings from Last 3 Encounters:  04/30/23 191 lb (86.6 kg)  04/23/23 190 lb (86.2 kg)  02/12/23 186 lb (84.4 kg)    Physical Exam Constitutional:      General: He is not in acute distress.    Appearance: Normal appearance. He is well-developed.     Comments: NAD  Eyes:     Conjunctiva/sclera: Conjunctivae normal.     Pupils: Pupils are equal, round, and reactive to light.  Neck:     Thyroid: No thyromegaly.     Vascular: No JVD.  Cardiovascular:     Rate and Rhythm: Normal rate and regular rhythm.     Heart sounds: Normal heart sounds. No murmur heard.    No friction rub. No gallop.  Pulmonary:     Effort: Pulmonary effort is normal. No respiratory distress.     Breath sounds: Normal breath sounds. No wheezing or rales.  Chest:     Chest wall: No tenderness.  Abdominal:     General: Bowel sounds are normal. There is no distension.     Palpations: Abdomen is soft. There is no mass.     Tenderness: There is no abdominal tenderness. There is no guarding or rebound.  Musculoskeletal:        General: Tenderness present. Normal range of motion.     Cervical back: Normal range of motion.  Lymphadenopathy:     Cervical: No cervical adenopathy.  Skin:    General: Skin is warm and dry.     Findings: No rash.  Neurological:     Mental Status: He is alert and oriented to person, place, and time.     Cranial Nerves: No cranial nerve deficit.     Motor: No abnormal muscle tone.     Coordination: Coordination normal.     Gait: Gait normal.     Deep Tendon Reflexes:  Reflexes are normal and symmetric.  Psychiatric:        Behavior: Behavior normal.        Thought Content: Thought content normal.        Judgment: Judgment normal.   R upper trap/scapular area w/pain  Lab Results  Component Value Date  WBC 9.9 02/12/2023   HGB 16.2 02/12/2023   HCT 47.1 02/12/2023   PLT 186.0 02/12/2023   GLUCOSE 234 (H) 04/22/2023   CHOL 153 03/06/2021   TRIG 170.0 (H) 03/06/2021   HDL 33.50 (L) 03/06/2021   LDLCALC 86 03/06/2021   ALT 29 02/12/2023   AST 15 02/12/2023   NA 139 04/22/2023   K 4.2 04/22/2023   CL 103 04/22/2023   CREATININE 0.92 04/22/2023   BUN 17 04/22/2023   CO2 28 04/22/2023   TSH 0.64 03/06/2021   PSA 3.87 03/06/2021   INR 1.1 (H) 01/08/2012   HGBA1C 8.5 (H) 04/22/2023   MICROALBUR 1.3 03/06/2021    No results found.  Assessment & Plan:   Problem List Items Addressed This Visit     DM type 2, controlled, with complication (HCC)    Increased Amaryl to 4 mg bid before (max dose)      Polyneuropathy due to type 2 diabetes mellitus (HCC)    Cont other Rx Increased Amaryl to 4 mg bid before (max dose)      Relevant Medications   cyclobenzaprine (FLEXERIL) 5 MG tablet   Cervical radiculitis - Primary    New: R C7-T1  Medrol pack Flexeril prn Norco 10/325 prn X ray  Potential benefits of a short term opioids use as well as potential risks (i.e. addiction risk, apnea etc) and complications (i.e. Somnolence, constipation and others) were explained to the patient and were aknowledged.      Relevant Medications   cyclobenzaprine (FLEXERIL) 5 MG tablet   Other Relevant Orders   DG Cervical Spine Complete      Meds ordered this encounter  Medications   methylPREDNISolone (MEDROL DOSEPAK) 4 MG TBPK tablet    Sig: As directed    Dispense:  21 tablet    Refill:  0   HYDROcodone-acetaminophen (NORCO) 10-325 MG tablet    Sig: Take 1 tablet by mouth every 6 (six) hours as needed for up to 5 days for severe pain.     Dispense:  20 tablet    Refill:  0   cyclobenzaprine (FLEXERIL) 5 MG tablet    Sig: Take 1 tablet (5 mg total) by mouth 3 (three) times daily as needed for muscle spasms.    Dispense:  30 tablet    Refill:  1      Follow-up: Return in about 2 weeks (around 05/14/2023) for a follow-up visit.  Sonda Primes, MD

## 2023-04-30 NOTE — Assessment & Plan Note (Addendum)
New: R C7-T1  Medrol pack Flexeril prn Norco 10/325 prn X ray  Potential benefits of a short term opioids use as well as potential risks (i.e. addiction risk, apnea etc) and complications (i.e. Somnolence, constipation and others) were explained to the patient and were aknowledged.

## 2023-04-30 NOTE — Assessment & Plan Note (Signed)
Increased Amaryl to 4 mg bid before (max dose)

## 2023-05-07 ENCOUNTER — Encounter: Payer: Self-pay | Admitting: Internal Medicine

## 2023-05-09 ENCOUNTER — Other Ambulatory Visit: Payer: Self-pay | Admitting: Internal Medicine

## 2023-05-10 ENCOUNTER — Other Ambulatory Visit: Payer: Self-pay

## 2023-05-10 MED ORDER — IBUPROFEN 800 MG PO TABS
800.0000 mg | ORAL_TABLET | Freq: Every day | ORAL | 0 refills | Status: DC | PRN
  Filled 2023-05-10: qty 30, 30d supply, fill #0

## 2023-05-10 MED ORDER — IBUPROFEN 800 MG PO TABS: 800.0000 mg | ORAL_TABLET | Freq: Every day | ORAL | 0 refills | Status: DC | PRN

## 2023-05-10 NOTE — Addendum Note (Signed)
Addended by: Deatra James on: 05/10/2023 11:09 AM   Modules accepted: Orders

## 2023-05-10 NOTE — Telephone Encounter (Signed)
Pt call he state MD sent his Ibuprofen to the wrong pharmacy. Need rx to go to [facility] not [facility].Marland KitchenRaechel Chute

## 2023-05-14 ENCOUNTER — Ambulatory Visit: Payer: Medicare Other | Admitting: Internal Medicine

## 2023-05-14 ENCOUNTER — Encounter: Payer: Self-pay | Admitting: Internal Medicine

## 2023-05-14 VITALS — BP 118/82 | HR 80 | Temp 98.0°F | Ht 66.0 in | Wt 191.0 lb

## 2023-05-14 DIAGNOSIS — G8929 Other chronic pain: Secondary | ICD-10-CM | POA: Diagnosis not present

## 2023-05-14 DIAGNOSIS — M5000 Cervical disc disorder with myelopathy, unspecified cervical region: Secondary | ICD-10-CM

## 2023-05-14 DIAGNOSIS — E118 Type 2 diabetes mellitus with unspecified complications: Secondary | ICD-10-CM | POA: Diagnosis not present

## 2023-05-14 DIAGNOSIS — M5442 Lumbago with sciatica, left side: Secondary | ICD-10-CM

## 2023-05-14 DIAGNOSIS — I10 Essential (primary) hypertension: Secondary | ICD-10-CM | POA: Diagnosis not present

## 2023-05-14 MED ORDER — HYDROCODONE-ACETAMINOPHEN 10-325 MG PO TABS: 1.0000 | ORAL_TABLET | Freq: Two times a day (BID) | ORAL | 0 refills | Status: DC | PRN

## 2023-05-14 NOTE — Assessment & Plan Note (Signed)
Amaryl  4 mg bid before (max dose)

## 2023-05-14 NOTE — Assessment & Plan Note (Addendum)
Not better R arm feels numb, R hand fingers are clumsy a little MRI ordered Use a neck pillow. Try a traction device. Use neck collar prn

## 2023-05-14 NOTE — Progress Notes (Signed)
Subjective:  Patient ID: [name] [name], male    DOB: 10-11-1956  Age: 67 y.o. MRN: [id number]  CC: Shoulder Pain   HPI [name] [name] presents for cervical pain - not better. He has to sleep in a recliner.... Worse at night R arm feels numb, R hand fingers are clumsy a little  Outpatient Medications Prior to Visit  Medication Sig Dispense Refill   alfuzosin (UROXATRAL) 10 MG 24 hr tablet Take 10 mg by mouth daily.     amLODipine (NORVASC) 5 MG tablet Take 1 tablet (5 mg total) by mouth daily. 90 tablet 3   Ascorbic Acid (VITAMIN C) 1000 MG tablet Take 1,000 mg by mouth once a week.     aspirin EC 81 MG tablet Take 1 tablet (81 mg total) by mouth daily. 100 tablet 3   blood glucose meter kit and supplies KIT Use to test blood sugar once a day. DX: E11.9 1 each 0   Blood Glucose Monitoring Suppl (CONTOUR NEXT MONITOR) w/Device KIT 1 Device by Does not apply route daily. 1 kit 0   chlorproMAZINE (THORAZINE) 10 MG tablet Take 1 tablet (10 mg total) by mouth 3 (three) times daily as needed. 10 tablet 0   cholecalciferol (VITAMIN D) 1000 UNITS tablet Take 1,000 Units by mouth daily.     clobetasol (TEMOVATE) 0.05 % external solution Apply 1 Application topically 2 (two) times daily as needed. Itchy scalp 50 mL 3   CONTOUR NEXT TEST test strip USE AS DIRECTED 100 strip 5   finasteride (PROSCAR) 5 MG tablet TAKE 1 TABLET BY MOUTH EVERY DAY 90 tablet 3   glimepiride (AMARYL) 4 MG tablet Take 1 tablet (4 mg total) by mouth in the morning and at bedtime. 180 tablet 3   ibuprofen (ADVIL) 800 MG tablet Take 1 tablet (800 mg total) by mouth daily as needed for moderate pain. 30 tablet 0   ketoconazole (NIZORAL) 2 % cream Apply 1 Application topically daily as needed for irritation.     Lancets MISC Use to check blood sugar once per day. 100 each 3   metFORMIN (GLUCOPHAGE) 500 MG tablet Take 1 tablet (500 mg total) by mouth daily with breakfast. 90 tablet 3   olmesartan (BENICAR) 40 MG tablet  TAKE 1 TABLET BY MOUTH EVERY DAY 90 tablet 3   ondansetron (ZOFRAN-ODT) 4 MG disintegrating tablet Take 1 tablet (4 mg total) by mouth every 8 (eight) hours as needed for nausea or vomiting. 20 tablet 0   potassium chloride SA (KLOR-CON) 20 MEQ tablet Take 1 tablet (20 mEq total) by mouth daily. (Patient taking differently: Take 20 mEq by mouth 2 (two) times a week.) 90 tablet 11   RYBELSUS 14 MG TABS Take 1 tablet by mouth daily.     tadalafil (CIALIS) 5 MG tablet Take 5 mg by mouth daily.     cyclobenzaprine (FLEXERIL) 5 MG tablet Take 1 tablet (5 mg total) by mouth 3 (three) times daily as needed for muscle spasms. 30 tablet 1   methylPREDNISolone (MEDROL DOSEPAK) 4 MG TBPK tablet As directed 21 tablet 0   pantoprazole (PROTONIX) 20 MG tablet TAKE 1 TABLET BY MOUTH TWICE A DAY BEFORE A MEAL 180 tablet 1   Facility-Administered Medications Prior to Visit  Medication Dose Route Frequency Provider Last Rate Last Admin   gemcitabine (GEMZAR) chemo syringe for bladder instillation 2,000 mg  2,000 mg Bladder Instillation Once Bjorn Pippin, MD        ROS: Review  of Systems  Constitutional:  Negative for appetite change, fatigue and unexpected weight change.  HENT:  Negative for congestion, nosebleeds, sneezing, sore throat and trouble swallowing.   Eyes:  Negative for itching and visual disturbance.  Respiratory:  Negative for cough.   Cardiovascular:  Negative for chest pain, palpitations and leg swelling.  Gastrointestinal:  Negative for abdominal distention, blood in stool, diarrhea and nausea.  Genitourinary:  Negative for frequency and hematuria.  Musculoskeletal:  Positive for arthralgias, neck pain and neck stiffness. Negative for back pain, gait problem and joint swelling.  Skin:  Negative for rash.  Neurological:  Negative for dizziness, tremors, speech difficulty and weakness.  Psychiatric/Behavioral:  Negative for agitation, dysphoric mood and sleep disturbance. The patient is not  nervous/anxious.     Objective:  BP 118/82 (BP Location: Right Arm, Patient Position: Sitting, Cuff Size: Large)   Pulse 80   Temp 98 F (36.7 C) (Oral)   Ht 5\' 6"  (1.676 m)   Wt 191 lb (86.6 kg)   SpO2 96%   BMI 30.83 kg/m   BP Readings from Last 3 Encounters:  05/14/23 118/82  04/30/23 (!) 150/118  04/23/23 130/82    Wt Readings from Last 3 Encounters:  05/14/23 191 lb (86.6 kg)  04/30/23 191 lb (86.6 kg)  04/23/23 190 lb (86.2 kg)    Physical Exam Constitutional:      General: He is not in acute distress.    Appearance: He is well-developed. He is obese.     Comments: NAD  Eyes:     Conjunctiva/sclera: Conjunctivae normal.     Pupils: Pupils are equal, round, and reactive to light.  Neck:     Thyroid: No thyromegaly.     Vascular: No JVD.  Cardiovascular:     Rate and Rhythm: Normal rate and regular rhythm.     Heart sounds: Normal heart sounds. No murmur heard.    No friction rub. No gallop.  Pulmonary:     Effort: Pulmonary effort is normal. No respiratory distress.     Breath sounds: Normal breath sounds. No wheezing or rales.  Chest:     Chest wall: No tenderness.  Abdominal:     General: Bowel sounds are normal. There is no distension.     Palpations: Abdomen is soft. There is no mass.     Tenderness: There is no abdominal tenderness. There is no guarding or rebound.  Musculoskeletal:        General: No tenderness. Normal range of motion.     Cervical back: Normal range of motion.  Lymphadenopathy:     Cervical: No cervical adenopathy.  Skin:    General: Skin is warm and dry.     Findings: No rash.  Neurological:     Mental Status: He is alert and oriented to person, place, and time.     Cranial Nerves: No cranial nerve deficit.     Motor: No abnormal muscle tone.     Coordination: Coordination normal.     Gait: Gait normal.     Deep Tendon Reflexes: Reflexes are normal and symmetric.  Psychiatric:        Behavior: Behavior normal.         Thought Content: Thought content normal.        Judgment: Judgment normal.     Lab Results  Component Value Date   WBC 9.9 02/12/2023   HGB 16.2 02/12/2023   HCT 47.1 02/12/2023   PLT 186.0 02/12/2023   GLUCOSE 234 (  H) 04/22/2023   CHOL 153 03/06/2021   TRIG 170.0 (H) 03/06/2021   HDL 33.50 (L) 03/06/2021   LDLCALC 86 03/06/2021   ALT 29 02/12/2023   AST 15 02/12/2023   NA 139 04/22/2023   K 4.2 04/22/2023   CL 103 04/22/2023   CREATININE 0.92 04/22/2023   BUN 17 04/22/2023   CO2 28 04/22/2023   TSH 0.64 03/06/2021   PSA 3.87 03/06/2021   INR 1.1 (H) 01/08/2012   HGBA1C 8.5 (H) 04/22/2023   MICROALBUR 1.3 03/06/2021    No results found.  Assessment & Plan:   Problem List Items Addressed This Visit     DM type 2, controlled, with complication (HCC)    Amaryl  4 mg bid before (max dose)      Essential hypertension    BP Readings from Last 3 Encounters:  05/14/23 118/82  04/30/23 (!) 150/118  04/23/23 130/82    Cont w/Olmesartan; Norvasc       CERVICAL RADICULOPATHY - Primary    Not better R arm feels numb, R hand fingers are clumsy a little MRI ordered Use a neck pillow. Try a traction device. Use neck collar prn      Relevant Orders   MR Cervical Spine Wo Contrast   LOW BACK PAIN    Resolved      Relevant Medications   HYDROcodone-acetaminophen (NORCO) 10-325 MG tablet      Meds ordered this encounter  Medications   HYDROcodone-acetaminophen (NORCO) 10-325 MG tablet    Sig: Take 1 tablet by mouth 2 (two) times daily as needed for severe pain.    Dispense:  60 tablet    Refill:  0      Follow-up: Return in about 4 weeks (around 06/11/2023) for a follow-up visit.  Sonda Primes, MD

## 2023-05-14 NOTE — Assessment & Plan Note (Signed)
Resolved

## 2023-05-14 NOTE — Assessment & Plan Note (Signed)
BP Readings from Last 3 Encounters:  05/14/23 118/82  04/30/23 (!) 150/118  04/23/23 130/82    Cont w/Olmesartan; Norvasc

## 2023-05-15 ENCOUNTER — Encounter (INDEPENDENT_AMBULATORY_CARE_PROVIDER_SITE_OTHER): Payer: Self-pay

## 2023-05-15 DIAGNOSIS — Z85528 Personal history of other malignant neoplasm of kidney: Secondary | ICD-10-CM | POA: Diagnosis not present

## 2023-05-15 DIAGNOSIS — R350 Frequency of micturition: Secondary | ICD-10-CM | POA: Diagnosis not present

## 2023-05-15 DIAGNOSIS — Z8551 Personal history of malignant neoplasm of bladder: Secondary | ICD-10-CM | POA: Diagnosis not present

## 2023-05-24 ENCOUNTER — Encounter: Payer: Self-pay | Admitting: Internal Medicine

## 2023-05-27 ENCOUNTER — Encounter: Payer: Self-pay | Admitting: Internal Medicine

## 2023-06-04 ENCOUNTER — Encounter: Payer: Self-pay | Admitting: Internal Medicine

## 2023-06-05 DIAGNOSIS — C673 Malignant neoplasm of anterior wall of bladder: Secondary | ICD-10-CM | POA: Diagnosis not present

## 2023-06-05 DIAGNOSIS — Z5111 Encounter for antineoplastic chemotherapy: Secondary | ICD-10-CM | POA: Diagnosis not present

## 2023-06-06 ENCOUNTER — Other Ambulatory Visit: Payer: Self-pay | Admitting: Internal Medicine

## 2023-06-08 ENCOUNTER — Encounter: Payer: Self-pay | Admitting: Internal Medicine

## 2023-06-09 ENCOUNTER — Other Ambulatory Visit: Payer: Self-pay | Admitting: Internal Medicine

## 2023-06-09 MED ORDER — GABAPENTIN 300 MG PO CAPS: 300.0000 mg | ORAL_CAPSULE | Freq: Two times a day (BID) | ORAL | 3 refills | Status: DC | PRN

## 2023-06-12 ENCOUNTER — Ambulatory Visit: Payer: Medicare Other | Admitting: Internal Medicine

## 2023-06-14 ENCOUNTER — Other Ambulatory Visit: Payer: Self-pay | Admitting: Internal Medicine

## 2023-06-14 MED ORDER — HYDROCODONE-ACETAMINOPHEN 10-325 MG PO TABS: 1.0000 | ORAL_TABLET | Freq: Two times a day (BID) | ORAL | 0 refills | Status: DC | PRN

## 2023-06-18 ENCOUNTER — Ambulatory Visit
Admission: RE | Admit: 2023-06-18 | Discharge: 2023-06-18 | Disposition: A | Discharge status: Home / Self Care | Payer: Medicare Other | Source: Ambulatory Visit | Attending: Internal Medicine | Admitting: Internal Medicine

## 2023-06-18 DIAGNOSIS — M4802 Spinal stenosis, cervical region: Secondary | ICD-10-CM | POA: Diagnosis not present

## 2023-06-18 DIAGNOSIS — M5000 Cervical disc disorder with myelopathy, unspecified cervical region: Secondary | ICD-10-CM

## 2023-06-18 DIAGNOSIS — M47812 Spondylosis without myelopathy or radiculopathy, cervical region: Secondary | ICD-10-CM | POA: Diagnosis not present

## 2023-06-18 DIAGNOSIS — M5023 Other cervical disc displacement, cervicothoracic region: Secondary | ICD-10-CM | POA: Diagnosis not present

## 2023-06-18 DIAGNOSIS — M50223 Other cervical disc displacement at C6-C7 level: Secondary | ICD-10-CM | POA: Diagnosis not present

## 2023-06-19 DIAGNOSIS — Z5111 Encounter for antineoplastic chemotherapy: Secondary | ICD-10-CM | POA: Diagnosis not present

## 2023-06-19 DIAGNOSIS — C673 Malignant neoplasm of anterior wall of bladder: Secondary | ICD-10-CM | POA: Diagnosis not present

## 2023-06-20 ENCOUNTER — Encounter: Payer: Self-pay | Admitting: Internal Medicine

## 2023-06-24 ENCOUNTER — Ambulatory Visit: Payer: Medicare Other | Admitting: Internal Medicine

## 2023-06-24 ENCOUNTER — Encounter: Payer: Self-pay | Admitting: Internal Medicine

## 2023-07-03 DIAGNOSIS — C673 Malignant neoplasm of anterior wall of bladder: Secondary | ICD-10-CM | POA: Diagnosis not present

## 2023-07-03 DIAGNOSIS — Z5111 Encounter for antineoplastic chemotherapy: Secondary | ICD-10-CM | POA: Diagnosis not present

## 2023-07-05 ENCOUNTER — Other Ambulatory Visit: Payer: Self-pay | Admitting: Internal Medicine

## 2023-07-05 ENCOUNTER — Encounter: Payer: Self-pay | Admitting: Internal Medicine

## 2023-07-05 DIAGNOSIS — M5000 Cervical disc disorder with myelopathy, unspecified cervical region: Secondary | ICD-10-CM

## 2023-07-15 ENCOUNTER — Ambulatory Visit: Payer: Medicare Other | Admitting: Internal Medicine

## 2023-07-15 ENCOUNTER — Encounter: Payer: Self-pay | Admitting: Internal Medicine

## 2023-07-15 VITALS — BP 164/98 | HR 71 | Temp 98.0°F | Ht 66.0 in | Wt 184.2 lb

## 2023-07-15 DIAGNOSIS — E118 Type 2 diabetes mellitus with unspecified complications: Secondary | ICD-10-CM

## 2023-07-15 DIAGNOSIS — M5000 Cervical disc disorder with myelopathy, unspecified cervical region: Secondary | ICD-10-CM

## 2023-07-15 DIAGNOSIS — M5412 Radiculopathy, cervical region: Secondary | ICD-10-CM

## 2023-07-15 DIAGNOSIS — Z7984 Long term (current) use of oral hypoglycemic drugs: Secondary | ICD-10-CM

## 2023-07-15 MED ORDER — GABAPENTIN 100 MG PO CAPS
100.0000 mg | ORAL_CAPSULE | Freq: Four times a day (QID) | ORAL | 3 refills | Status: DC | PRN
Start: 2023-07-15 — End: 2023-11-18

## 2023-07-15 MED ORDER — METFORMIN HCL 500 MG PO TABS
500.0000 mg | ORAL_TABLET | Freq: Two times a day (BID) | ORAL | 3 refills | Status: DC
Start: 2023-07-15 — End: 2023-08-13

## 2023-07-15 NOTE — Assessment & Plan Note (Addendum)
Amaryl  4 mg bid before (max dose) Metformin bid 500 mg On Rybelsus

## 2023-07-15 NOTE — Progress Notes (Signed)
Subjective:  Patient ID: [name] [name], male    DOB: Nov 23, 1955  Age: 67 y.o. MRN: [id number]  CC: Medical Management of Chronic Issues (4 week follow up. ) and Referral (For patients shoulder/)   HPI [name] [name] presents for R hand weakness, neck pain F/u on DM  Outpatient Medications Prior to Visit  Medication Sig Dispense Refill   alfuzosin (UROXATRAL) 10 MG 24 hr tablet Take 10 mg by mouth daily.     amLODipine (NORVASC) 5 MG tablet Take 1 tablet (5 mg total) by mouth daily. 90 tablet 3   Ascorbic Acid (VITAMIN C) 1000 MG tablet Take 1,000 mg by mouth once a week.     blood glucose meter kit and supplies KIT Use to test blood sugar once a day. DX: E11.9 1 each 0   Blood Glucose Monitoring Suppl (CONTOUR NEXT MONITOR) w/Device KIT 1 Device by Does not apply route daily. 1 kit 0   chlorproMAZINE (THORAZINE) 10 MG tablet Take 1 tablet (10 mg total) by mouth 3 (three) times daily as needed. 10 tablet 0   cholecalciferol (VITAMIN D) 1000 UNITS tablet Take 1,000 Units by mouth daily.     clobetasol (TEMOVATE) 0.05 % external solution Apply 1 Application topically 2 (two) times daily as needed. Itchy scalp 50 mL 3   CONTOUR NEXT TEST test strip USE AS DIRECTED 100 strip 5   cyclobenzaprine (FLEXERIL) 5 MG tablet Take 5 mg by mouth 3 (three) times daily as needed.     finasteride (PROSCAR) 5 MG tablet TAKE 1 TABLET BY MOUTH EVERY DAY 90 tablet 3   glimepiride (AMARYL) 4 MG tablet Take 1 tablet (4 mg total) by mouth in the morning and at bedtime. 180 tablet 3   HYDROcodone-acetaminophen (NORCO) 10-325 MG tablet Take 1 tablet by mouth 2 (two) times daily as needed for severe pain. 40 tablet 0   ibuprofen (ADVIL) 800 MG tablet TAKE 1 TABLET (800 MG TOTAL) BY MOUTH DAILY AS NEEDED FOR MODERATE PAIN. 30 tablet 0   ketoconazole (NIZORAL) 2 % cream Apply 1 Application topically daily as needed for irritation.     Lancets MISC Use to check blood sugar once per day. 100 each 3   olmesartan  (BENICAR) 40 MG tablet TAKE 1 TABLET BY MOUTH EVERY DAY 90 tablet 3   ondansetron (ZOFRAN-ODT) 4 MG disintegrating tablet Take 1 tablet (4 mg total) by mouth every 8 (eight) hours as needed for nausea or vomiting. 20 tablet 0   potassium chloride SA (KLOR-CON) 20 MEQ tablet Take 1 tablet (20 mEq total) by mouth daily. (Patient taking differently: Take 20 mEq by mouth 2 (two) times a week.) 90 tablet 11   RYBELSUS 14 MG TABS Take 1 tablet by mouth daily.     tadalafil (CIALIS) 5 MG tablet Take 5 mg by mouth daily.     gabapentin (NEURONTIN) 300 MG capsule Take 1 capsule (300 mg total) by mouth 2 (two) times daily as needed. 60 capsule 3   metFORMIN (GLUCOPHAGE) 500 MG tablet Take 1 tablet (500 mg total) by mouth daily with breakfast. 90 tablet 3   aspirin EC 81 MG tablet Take 1 tablet (81 mg total) by mouth daily. (Patient not taking: Reported on 07/15/2023) 100 tablet 3   Facility-Administered Medications Prior to Visit  Medication Dose Route Frequency Provider Last Rate Last Admin   gemcitabine (GEMZAR) chemo syringe for bladder instillation 2,000 mg  2,000 mg Bladder Instillation Once Bjorn Pippin, MD  ROS: Review of Systems  Constitutional:  Positive for fatigue. Negative for appetite change and unexpected weight change.  HENT:  Negative for congestion, nosebleeds, sneezing, sore throat and trouble swallowing.   Eyes:  Negative for itching and visual disturbance.  Respiratory:  Negative for cough.   Cardiovascular:  Negative for chest pain, palpitations and leg swelling.  Gastrointestinal:  Negative for abdominal distention, blood in stool, diarrhea and nausea.  Genitourinary:  Negative for frequency and hematuria.  Musculoskeletal:  Positive for neck pain and neck stiffness. Negative for back pain, gait problem and joint swelling.  Skin:  Negative for rash.  Neurological:  Positive for weakness. Negative for dizziness, tremors and speech difficulty.  Psychiatric/Behavioral:   Negative for agitation, dysphoric mood and sleep disturbance. The patient is not nervous/anxious.     Objective:  BP (!) 164/98 (BP Location: Left Arm, Patient Position: Sitting, Cuff Size: Normal)   Pulse 71   Temp 98 F (36.7 C) (Oral)   Ht 5\' 6"  (1.676 m)   Wt 184 lb 3.2 oz (83.6 kg)   SpO2 97%   BMI 29.73 kg/m   BP Readings from Last 3 Encounters:  07/15/23 (!) 164/98  05/14/23 118/82  04/30/23 (!) 150/118    Wt Readings from Last 3 Encounters:  07/15/23 184 lb 3.2 oz (83.6 kg)  05/14/23 191 lb (86.6 kg)  04/30/23 191 lb (86.6 kg)    Physical Exam Constitutional:      General: He is not in acute distress.    Appearance: He is well-developed. He is obese.     Comments: NAD  Eyes:     Conjunctiva/sclera: Conjunctivae normal.     Pupils: Pupils are equal, round, and reactive to light.  Neck:     Thyroid: No thyromegaly.     Vascular: No JVD.  Cardiovascular:     Rate and Rhythm: Normal rate and regular rhythm.     Heart sounds: Normal heart sounds. No murmur heard.    No friction rub. No gallop.  Pulmonary:     Effort: Pulmonary effort is normal. No respiratory distress.     Breath sounds: Normal breath sounds. No wheezing or rales.  Chest:     Chest wall: No tenderness.  Abdominal:     General: Bowel sounds are normal. There is no distension.     Palpations: Abdomen is soft. There is no mass.     Tenderness: There is no abdominal tenderness. There is no left CVA tenderness, guarding or rebound.  Musculoskeletal:        General: Tenderness present. Normal range of motion.     Cervical back: Normal range of motion.     Right lower leg: No edema.     Left lower leg: No edema.  Lymphadenopathy:     Cervical: No cervical adenopathy.  Skin:    General: Skin is warm and dry.     Findings: No rash.  Neurological:     Mental Status: He is alert and oriented to person, place, and time.     Cranial Nerves: No cranial nerve deficit.     Motor: Weakness present. No  abnormal muscle tone.     Coordination: Coordination normal.     Gait: Gait normal.     Deep Tendon Reflexes: Reflexes are normal and symmetric.  Psychiatric:        Behavior: Behavior normal.        Thought Content: Thought content normal.        Judgment: Judgment normal.  Weak R grip  Lab Results  Component Value Date   WBC 9.9 02/12/2023   HGB 16.2 02/12/2023   HCT 47.1 02/12/2023   PLT 186.0 02/12/2023   GLUCOSE 234 (H) 04/22/2023   CHOL 153 03/06/2021   TRIG 170.0 (H) 03/06/2021   HDL 33.50 (L) 03/06/2021   LDLCALC 86 03/06/2021   ALT 29 02/12/2023   AST 15 02/12/2023   NA 139 04/22/2023   K 4.2 04/22/2023   CL 103 04/22/2023   CREATININE 0.92 04/22/2023   BUN 17 04/22/2023   CO2 28 04/22/2023   TSH 0.64 03/06/2021   PSA 3.87 03/06/2021   INR 1.1 (H) 01/08/2012   HGBA1C 8.5 (H) 04/22/2023   MICROALBUR 1.3 03/06/2021    MR Cervical Spine Wo Contrast  Result Date: 07/03/2023 CLINICAL DATA:  Neck pain. Chronic degenerative changes. Right arm radicular pain. Right upper extremity pain and weakness with some numbness. Symptoms worsening over the last 2 months. EXAM: MRI CERVICAL SPINE WITHOUT CONTRAST TECHNIQUE: Multiplanar, multisequence MR imaging of the cervical spine was performed. No intravenous contrast was administered. COMPARISON:  Radiography 04/30/2023 FINDINGS: Alignment: Normal Vertebrae: No fracture or focal bone lesion. No edematous endplate marrow changes or edematous facet arthritis. Cord: No primary cord lesion.  See below regarding stenosis. Posterior Fossa, vertebral arteries, paraspinal tissues: Negative Disc levels: Foramen magnum, C1-2 and C2-3 levels are normal. C3-4: Chronic degenerative spondylosis with endplate osteophytes and mild bulging of the disc. Mild canal narrowing with AP diameter in the midline 8.5 mm. No cord compression. Mild bilateral foraminal narrowing, not visibly compressive. C4-5: Mild bulging of the disc.  No canal or foraminal  stenosis. C5-6: Spondylosis with endplate osteophytes and protruding disc material more prominent in the right posterolateral direction. Effacement of the subarachnoid space with indentation of the cord, particularly on the right. AP diameter of the canal in the midline 7.4 mm. Foraminal stenosis right worse than left. In particular, the right C6 nerve root is likely affected. C6-7: Endplate osteophytes and bulging of the disc more prominent towards the right. No compressive canal stenosis. Moderate foraminal narrowing on the right that could possibly affect the C7 nerve. This is not as severe as the level above. 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 IMPRESSION: 1. 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 2. C5-6: Spondylosis with endplate osteophytes and protruding disc material more prominent in the right posterolateral direction. Effacement of the subarachnoid space with indentation of the cord, particularly on the right. AP diameter of the canal in the midline 7.4 mm. Foraminal stenosis right worse than left. In particular, the right C6 nerve root is likely affected. 3. C6-7: Endplate osteophytes and bulging of the disc more prominent towards the right. Moderate foraminal narrowing on the right that could possibly affect the C7 nerve. This is not as severe as the level above. Electronically Signed   By: Paulina Fusi M.D.   On: 07/03/2023 17:38    Assessment & Plan:   Problem List Items Addressed This Visit     DM type 2, controlled, with complication (HCC)    Amaryl  4 mg bid before (max dose) Metformin bid 500 mg On Rybelsus      Relevant Medications   metFORMIN (GLUCOPHAGE) 500 MG tablet   CERVICAL RADICULOPATHY - Primary    Will ref to see Ernestine Mcmurray, MD, [facility] [place]   Worse: cervical radiculopathy, R  hand weakness   C-spine MRI  IMPRESSION:   "1. 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  2. C5-6: Spondylosis with endplate osteophytes and protruding disc  material more prominent in the right posterolateral direction.  Effacement of the subarachnoid space with indentation of the cord,  particularly on the right. AP diameter of the canal in the midline  7.4 mm. Foraminal stenosis right worse than left. In particular, the  right C6 nerve root is likely affected.  3. C6-7: Endplate osteophytes and bulging of the disc more prominent  towards the right. Moderate foraminal narrowing on the right that  could possibly affect the C7 nerve. This is not as severe as the  level above."   Increase Gabaprentin po      Relevant Orders   Ambulatory referral to Neurosurgery   Cervical radiculitis    Worse: cervical radiculopathy, R hand weakness   C-spine MRI IMPRESSION:   "1. 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  2. C5-6: Spondylosis with endplate osteophytes and protruding disc  material more prominent in the right posterolateral direction.  Effacement of the subarachnoid space with indentation of the cord,  particularly on the right. AP diameter of the canal in the midline  7.4 mm. Foraminal stenosis right worse than left. In particular, the  right C6 nerve root is likely affected.  3. C6-7: Endplate osteophytes and bulging of the disc more prominent  towards the right. Moderate foraminal narrowing on the right that  could possibly affect the C7 nerve. This is not as severe as the  level above."   Increase Gabaprentin po      Relevant Medications   cyclobenzaprine (FLEXERIL) 5 MG tablet   gabapentin (NEURONTIN) 100 MG capsule      Meds ordered this encounter  Medications   gabapentin (NEURONTIN) 100 MG capsule    Sig: Take 1 capsule (100 mg total) by mouth 4 (four)  times daily as needed.    Dispense:  120 capsule    Refill:  3   metFORMIN (GLUCOPHAGE) 500 MG tablet    Sig: Take 1 tablet (500 mg total) by mouth 2 (two) times daily with a meal.    Dispense:  180 tablet    Refill:  3      Follow-up: Return in about 6 weeks (around 08/26/2023) for a follow-up visit.  Sonda Primes, MD

## 2023-07-15 NOTE — Assessment & Plan Note (Signed)
Worse: cervical radiculopathy, R hand weakness   C-spine MRI IMPRESSION:   "1. 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  2. C5-6: Spondylosis with endplate osteophytes and protruding disc  material more prominent in the right posterolateral direction.  Effacement of the subarachnoid space with indentation of the cord,  particularly on the right. AP diameter of the canal in the midline  7.4 mm. Foraminal stenosis right worse than left. In particular, the  right C6 nerve root is likely affected.  3. C6-7: Endplate osteophytes and bulging of the disc more prominent  towards the right. Moderate foraminal narrowing on the right that  could possibly affect the C7 nerve. This is not as severe as the  level above."   Increase Gabaprentin po

## 2023-07-15 NOTE — Assessment & Plan Note (Signed)
Will ref to see Ernestine Mcmurray, MD, [facility] [place]   Worse: cervical radiculopathy, R hand weakness   C-spine MRI IMPRESSION:   "1. 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  2. C5-6: Spondylosis with endplate osteophytes and protruding disc  material more prominent in the right posterolateral direction.  Effacement of the subarachnoid space with indentation of the cord,  particularly on the right. AP diameter of the canal in the midline  7.4 mm. Foraminal stenosis right worse than left. In particular, the  right C6 nerve root is likely affected.  3. C6-7: Endplate osteophytes and bulging of the disc more prominent  towards the right. Moderate foraminal narrowing on the right that  could possibly affect the C7 nerve. This is not as severe as the  level above."   Increase Gabaprentin po

## 2023-07-17 ENCOUNTER — Encounter: Payer: Self-pay | Admitting: Neurosurgery

## 2023-07-17 ENCOUNTER — Other Ambulatory Visit: Payer: Self-pay | Admitting: Internal Medicine

## 2023-07-17 ENCOUNTER — Other Ambulatory Visit: Payer: Self-pay

## 2023-07-17 ENCOUNTER — Ambulatory Visit: Payer: Medicare Other | Admitting: Neurosurgery

## 2023-07-17 VITALS — BP 140/86 | Ht 66.0 in | Wt 184.0 lb

## 2023-07-17 DIAGNOSIS — R202 Paresthesia of skin: Secondary | ICD-10-CM

## 2023-07-17 DIAGNOSIS — M50222 Other cervical disc displacement at C5-C6 level: Secondary | ICD-10-CM | POA: Diagnosis not present

## 2023-07-17 DIAGNOSIS — M5020 Other cervical disc displacement, unspecified cervical region: Secondary | ICD-10-CM

## 2023-07-17 DIAGNOSIS — G5631 Lesion of radial nerve, right upper limb: Secondary | ICD-10-CM

## 2023-07-17 DIAGNOSIS — G5611 Other lesions of median nerve, right upper limb: Secondary | ICD-10-CM

## 2023-07-17 NOTE — Progress Notes (Signed)
Consulting Department:  Primary care physician  Primary Physician:  Plotnikov, Georgina Quint, MD  Chief Complaint: Right upper extremity weakness  History of Present Illness: 07/17/2023 [name] [name] is a 67 y.o. male who presents with the chief complaint of right upper extremity weakness.  He has a history of cervical spondylosis as well as lumbar spondylosis.  He is here today for evaluation of a herniated cervical disc with right sided upper extremity weakness.  He states that this weakness was preceded by severe shoulder pain that kept him from sleeping for multiple days which eventually subsided.  Following this episode he then noticed that he was having difficulty lifting his fingers.  He states that the pain is much better now but he still continues to have significant weakness that slowly worsened and has become a significant deficit.  He is not having any pain currently.  Does have some mild tingling mostly in a C7 distribution at this point.  Has noticed that his grip has changed somewhat but only has noticed weakness in his hand not in his arm  Review of Systems:  A 10 point review of systems is negative, except for the pertinent positives and negatives detailed in the HPI.  Past Medical History: Past Medical History:  Diagnosis Date   Benign localized prostatic hyperplasia with lower urinary tract symptoms (LUTS)    urologist--- dr Annabell Howells   Bladder cancer [facility])    urologist---- dr Annabell Howells---  s/p TURBT 11/2020; 03/ 2022   Coronary artery disease    mild   DDD (degenerative disc disease), cervical    From 1980s   Diabetes mellitus type II    followed by pcp--   (01-05-2021  per pt checks blood sugar three times per week,  fasting sugar --- 130--140_   Difficult intubation 07/28/2012   partial nephrectomy done @[facility]  07-28-2012  found to have anterior larynx , diffcult intubation, referred to anesthesia record in epic    Dyslipidemia    ED (erectile dysfunction)    Elevated PSA     GERD (gastroesophageal reflux disease)    History of adenomatous polyp of colon    History of kidney stones    History of prostatitis 2008   History of renal carcinoma urologist--- dr Annabell Howells   07-28-2012 for renal neoplasm  s/p right partal nephrectomy    Hypertension    followed by pcp   Osteoarthritis    Back   Renal carcinoma, right [facility])     Past Surgical History: Past Surgical History:  Procedure Laterality Date   CATARACT EXTRACTION     COLONOSCOPY N/A 03/03/2013   Procedure: COLONOSCOPY;  Surgeon: Hilarie Fredrickson, MD;  Location: [facility];  Service: Endoscopy;  Laterality: N/A;   COLONOSCOPY WITH PROPOFOL N/A 09/29/2019   Procedure: COLONOSCOPY WITH PROPOFOL;  Surgeon: Hilarie Fredrickson, MD;  [facility];  Service: Endoscopy;  Laterality: N/A;   CYSTOSCOPY WITH BIOPSY N/A 12/09/2020   Procedure: CYSTOSCOPY WITH TURBT, BIOPSY AND FULGURATION;  Surgeon: Bjorn Pippin, MD;  [facility];  Service: Urology;  Laterality: N/A;  REQUESTING 45 MINS   EXTRACORPOREAL SHOCK WAVE LITHOTRIPSY  01-10-2015;  03-03-2012  @[facility]    POLYPECTOMY  09/29/2019   Procedure: POLYPECTOMY;  Surgeon: Hilarie Fredrickson, MD;  [facility];  Service: Endoscopy;;   ROBOT ASSISTED LAPAROSCOPIC PARTIAL NEPHRECTOMY  07-28-2012   DR Laverle Patter  @[facility]    TONSILLECTOMY  1965   TRANSURETHRAL RESECTION OF BLADDER TUMOR WITH MITOMYCIN-C N/A 01/06/2021   Procedure: TRANSURETHRAL RESECTION  OF BLADDER TUMOR WITH GEMCITIBINE IN PACU;  Surgeon: Bjorn Pippin, MD;  Location: [facility];  Service: Urology;  Laterality: N/A;   TRANSURETHRAL RESECTION OF BLADDER TUMOR WITH MITOMYCIN-C N/A 09/01/2021   Procedure: TRANSURETHRAL RESECTION OF BLADDER TUMOR WITH POST OPERATIVE INSTILLATION OF GEMCITABINE;  Surgeon: Bjorn Pippin, MD;  Location: [facility];  Service: Urology;  Laterality: N/A;   TRANSURETHRAL RESECTION OF BLADDER TUMOR WITH MITOMYCIN-C Bilateral 11/13/2022   Procedure: CYSTOSCOPY  TRANSURETHRAL RESECTION OF BLADDER TUMOR BILATERAL RETROGRADES WITH INSTILL GEMCTABINE;  Surgeon: Bjorn Pippin, MD;  Location[facility];  Service: Urology;  Laterality: Bilateral;  1 HR FOR CASE    Allergies: Allergies as of 07/17/2023 - Review Complete 07/17/2023  Allergen Reaction Noted   Tamsulosin Other (See Comments)    Codeine Itching and Rash 07/20/2020   Wound dressing adhesive Itching and Other (See Comments) 09/16/2021    Medications:  Current Outpatient Medications:    alfuzosin (UROXATRAL) 10 MG 24 hr tablet, Take 10 mg by mouth daily., Disp: , Rfl:    amLODipine (NORVASC) 5 MG tablet, Take 1 tablet (5 mg total) by mouth daily., Disp: 90 tablet, Rfl: 3   Ascorbic Acid (VITAMIN C) 1000 MG tablet, Take 1,000 mg by mouth once a week., Disp: , Rfl:    aspirin EC 81 MG tablet, Take 1 tablet (81 mg total) by mouth daily., Disp: 100 tablet, Rfl: 3   blood glucose meter kit and supplies KIT, Use to test blood sugar once a day. DX: E11.9, Disp: 1 each, Rfl: 0   Blood Glucose Monitoring Suppl (CONTOUR NEXT MONITOR) w/Device KIT, 1 Device by Does not apply route daily., Disp: 1 kit, Rfl: 0   chlorproMAZINE (THORAZINE) 10 MG tablet, Take 1 tablet (10 mg total) by mouth 3 (three) times daily as needed., Disp: 10 tablet, Rfl: 0   cholecalciferol (VITAMIN D) 1000 UNITS tablet, Take 1,000 Units by mouth daily., Disp: , Rfl:    clobetasol (TEMOVATE) 0.05 % external solution, Apply 1 Application topically 2 (two) times daily as needed. Itchy scalp, Disp: 50 mL, Rfl: 3   CONTOUR NEXT TEST test strip, USE AS DIRECTED, Disp: 100 strip, Rfl: 5   cyclobenzaprine (FLEXERIL) 5 MG tablet, Take 5 mg by mouth 3 (three) times daily as needed., Disp: , Rfl:    finasteride (PROSCAR) 5 MG tablet, TAKE 1 TABLET BY MOUTH EVERY DAY, Disp: 90 tablet, Rfl: 3   gabapentin (NEURONTIN) 100 MG capsule, Take 1 capsule (100 mg total) by mouth 4 (four) times daily as needed., Disp: 120 capsule, Rfl: 3   glimepiride (AMARYL)  4 MG tablet, Take 1 tablet (4 mg total) by mouth in the morning and at bedtime., Disp: 180 tablet, Rfl: 3   HYDROcodone-acetaminophen (NORCO) 10-325 MG tablet, Take 1 tablet by mouth 2 (two) times daily as needed for severe pain., Disp: 40 tablet, Rfl: 0   ibuprofen (ADVIL) 800 MG tablet, TAKE 1 TABLET (800 MG TOTAL) BY MOUTH DAILY AS NEEDED FOR MODERATE PAIN., Disp: 30 tablet, Rfl: 0   ketoconazole (NIZORAL) 2 % cream, Apply 1 Application topically daily as needed for irritation., Disp: , Rfl:    Lancets MISC, Use to check blood sugar once per day., Disp: 100 each, Rfl: 3   metFORMIN (GLUCOPHAGE) 500 MG tablet, Take 1 tablet (500 mg total) by mouth 2 (two) times daily with a meal., Disp: 180 tablet, Rfl: 3   olmesartan (BENICAR) 40 MG tablet, TAKE 1 TABLET BY MOUTH EVERY DAY,  Disp: 90 tablet, Rfl: 3   ondansetron (ZOFRAN-ODT) 4 MG disintegrating tablet, Take 1 tablet (4 mg total) by mouth every 8 (eight) hours as needed for nausea or vomiting., Disp: 20 tablet, Rfl: 0   potassium chloride SA (KLOR-CON) 20 MEQ tablet, Take 1 tablet (20 mEq total) by mouth daily. (Patient taking differently: Take 20 mEq by mouth 2 (two) times a week.), Disp: 90 tablet, Rfl: 11   RYBELSUS 14 MG TABS, Take 1 tablet by mouth daily., Disp: , Rfl:    tadalafil (CIALIS) 5 MG tablet, Take 5 mg by mouth daily., Disp: , Rfl:  No current facility-administered medications for this visit.  Facility-Administered Medications Ordered in Other Visits:    gemcitabine (GEMZAR) chemo syringe for bladder instillation 2,000 mg, 2,000 mg, Bladder Instillation, Once, Bjorn Pippin, MD   Social History: Social History   Tobacco Use   Smoking status: Every Day    Current packs/day: 0.25    Average packs/day: 0.3 packs/day for 30.0 years (7.5 ttl pk-yrs)    Types: Cigarettes   Smokeless tobacco: Never   Tobacco comments:    1 pack every 2 days  Vaping Use   Vaping status: Never Used  Substance Use Topics   Alcohol use: Not  Currently   Drug use: Yes    Types: Marijuana    Family Medical History: Family History  Problem Relation Age of Onset   Lymphoma Mother    Colon cancer Mother 68   Colon cancer Paternal Aunt    Esophageal cancer Neg Hx    Rectal cancer Neg Hx    Stomach cancer Neg Hx     Physical Examination: Vitals:   07/17/23 1315  BP: (!) 140/86     General: Patient is well developed, well nourished, calm, collected, and in no apparent distress.  NEUROLOGICAL:  General: In no acute distress.   Awake, alert, oriented to person, place, and time.  Pupils equal round and reactive to light.  Facial tone is symmetric.  Tongue protrusion is midline.  There is no pronator drift.  ROM of spine: full.  Palpation of spine: nontender.    Strength: Deltoid strength is full.  External rotation is full.  Elbow flexion is full, elbow extension is full, wrist extension shows radial deviation and weakness with the ECU but full strength with ECR B/L, he has no evidence of radial innervated finger extensors.  He has some intrinsic hand weakness especially noticeable in the AIN distribution as well.  He has some mild sensory changes in the C7 distribution on the right  Reflexes are 2+ and symmetric at the biceps, triceps, brachioradialis, patella and achilles. Hoffman's is absent.  Clonus is not present.  Toes are down-going.    Gait is normal.  No difficulty with tandem gait.      Patient trying to perform right wrist extension and finger extension simultaneously pictured above  Imaging: Narrative & Impression  CLINICAL DATA:  Neck pain. Chronic degenerative changes. Right arm radicular pain. Right upper extremity pain and weakness with some numbness. Symptoms worsening over the last 2 months.   EXAM: MRI CERVICAL SPINE WITHOUT CONTRAST   TECHNIQUE: Multiplanar, multisequence MR imaging of the cervical spine was performed. No intravenous contrast was administered.   COMPARISON:  Radiography  04/30/2023   FINDINGS: Alignment: Normal   Vertebrae: No fracture or focal bone lesion. No edematous endplate marrow changes or edematous facet arthritis.   Cord: No primary cord lesion.  See below regarding stenosis.   Posterior  Fossa, vertebral arteries, paraspinal tissues: Negative   Disc levels:   Foramen magnum, C1-2 and C2-3 levels are normal.   C3-4: Chronic degenerative spondylosis with endplate osteophytes and mild bulging of the disc. Mild canal narrowing with AP diameter in the midline 8.5 mm. No cord compression. Mild bilateral foraminal narrowing, not visibly compressive.   C4-5: Mild bulging of the disc.  No canal or foraminal stenosis.   C5-6: Spondylosis with endplate osteophytes and protruding disc material more prominent in the right posterolateral direction. Effacement of the subarachnoid space with indentation of the cord, particularly on the right. AP diameter of the canal in the midline 7.4 mm. Foraminal stenosis right worse than left. In particular, the right C6 nerve root is likely affected.   C6-7: Endplate osteophytes and bulging of the disc more prominent towards the right. No compressive canal stenosis. Moderate foraminal narrowing on the right that could possibly affect the C7 nerve. This is not as severe as the level above.   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   IMPRESSION: 1. 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 2. C5-6: Spondylosis with endplate osteophytes and protruding disc material more prominent in the right posterolateral direction. Effacement of the subarachnoid space with indentation of the cord, particularly on the right. AP diameter of the canal in the midline 7.4 mm. Foraminal stenosis right worse than left. In particular, the right C6 nerve root is likely affected. 3. C6-7: Endplate  osteophytes and bulging of the disc more prominent towards the right. Moderate foraminal narrowing on the right that could possibly affect the C7 nerve. This is not as severe as the level above.     Electronically Signed   By: Paulina Fusi M.D.   On: 07/03/2023 17:38        I have personally reviewed the images and agree with the above interpretation.  Labs:    Latest Ref Rng & Units 02/12/2023    3:09 PM 02/05/2023   10:05 AM 11/02/2022    2:03 PM  CBC  WBC 4.0 - 10.5 K/uL 9.9  8.6  9.0   Hemoglobin 13.0 - 17.0 g/dL 16.1  09.6  04.5   Hematocrit 39.0 - 52.0 % 47.1  45.3  52.7   Platelets 150.0 - 400.0 K/uL 186.0  154.0  141        Assessment and Plan: Mr. Hosking is a pleasant 67 y.o. male with presentation of right upper extremity weakness which is mostly defined by a finger drop without severe wrist drop.  He stated that this came on with a prodrome of severe right shoulder pain that was keeping him from sleeping.  After this shoulder pain wore off he then developed a progressive deficit in his finger extension.  On physical examination he shows weakness mostly in PIN innervated and AIN innervated musculature.  His MRI demonstrates a C5-6 disc herniation on the right, however his examination is reassuring as he has good bicep function, good wrist extension function in ECR, intact biceps reflex, and intact's C6 sensation.  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  In order to further evaluate would like to refer him for a EMG and nerve conduction study.  This will be helpful in localizing whether or not this is a patchy  plexopathy with PIN involvement versus a cervical radiculopathy.  Should this be an atypical presentation of a cervical radiculopathy at C6 then we could consider a cervical discectomy and fusion, however his neck pain at this time is almost a 0.  He would benefit from occupational therapy referral given his deficit however he is doing a good job maintaining passive range of motion himself.  Lovenia Kim, MD/MSCR Dept. of Neurosurgery

## 2023-07-17 NOTE — Progress Notes (Deleted)
Referring Physician:  Tresa Garter, MD [address] [place],  [place] [address]  Primary Physician:  Tresa Garter, MD  History of Present Illness: 07/17/2023 Mr. Janari Yamada is here today with a chief complaint of ***  Neck pain radiating down the right arm to the fingers. The hand will not open  Duration: 3 months Location: can't open the hand or stretch the fingers on right hand Quality: *** Severity: 2/10  Precipitating: aggravated by *** Modifying factors: made better by *** Weakness: none Timing: constant Bowel/Bladder Dysfunction: none  Conservative measures:  Physical therapy: no  Multimodal medical therapy including regular antiinflammatories: ***  Injections: no epidural steroid injections  Past Surgery: no  [name] has ***no symptoms of cervical myelopathy.  The symptoms are causing a significant impact on the patient's life.   I have utilized the care everywhere function in epic to review the outside records available from external health systems.  Review of Systems:  A 10 point review of systems is negative, except for the pertinent positives and negatives detailed in the HPI.  Past Medical History: Past Medical History:  Diagnosis Date   Benign localized prostatic hyperplasia with lower urinary tract symptoms (LUTS)    urologist--- dr Annabell Howells   Bladder cancer [facility])    urologist---- dr Annabell Howells---  s/p TURBT 11/2020; 03/ 2022   Coronary artery disease    mild   DDD (degenerative disc disease), cervical    From 1980s   Diabetes mellitus type II    followed by pcp--   (01-05-2021  per pt checks blood sugar three times per week,  fasting sugar --- 130--140_   Difficult intubation 07/28/2012   partial nephrectomy done @[facility]  07-28-2012  found to have anterior larynx , diffcult intubation, referred to anesthesia record in epic    Dyslipidemia    ED (erectile dysfunction)    Elevated PSA    GERD (gastroesophageal reflux  disease)    History of adenomatous polyp of colon    History of kidney stones    History of prostatitis 2008   History of renal carcinoma urologist--- dr Annabell Howells   07-28-2012 for renal neoplasm  s/p right partal nephrectomy    Hypertension    followed by pcp   Osteoarthritis    Back   Renal carcinoma, right [facility])     Past Surgical History: Past Surgical History:  Procedure Laterality Date   CATARACT EXTRACTION     COLONOSCOPY N/A 03/03/2013   Procedure: COLONOSCOPY;  Surgeon: Hilarie Fredrickson, MD;  [facility];  Service: Endoscopy;  Laterality: N/A;   COLONOSCOPY WITH PROPOFOL N/A 09/29/2019   Procedure: COLONOSCOPY WITH PROPOFOL;  Surgeon: Hilarie Fredrickson, MD;  [facility];  Service: Endoscopy;  Laterality: N/A;   CYSTOSCOPY WITH BIOPSY N/A 12/09/2020   Procedure: CYSTOSCOPY WITH TURBT, BIOPSY AND FULGURATION;  Surgeon: Bjorn Pippin, MD;  [facility];  Service: Urology;  Laterality: N/A;  REQUESTING 45 MINS   EXTRACORPOREAL SHOCK WAVE LITHOTRIPSY  01-10-2015;  03-03-2012  @[facility]    POLYPECTOMY  09/29/2019   Procedure: POLYPECTOMY;  Surgeon: Hilarie Fredrickson, MD;  [facility];  Service: Endoscopy;;   ROBOT ASSISTED LAPAROSCOPIC PARTIAL NEPHRECTOMY  07-28-2012   DR Laverle Patter  @[facility]    TONSILLECTOMY  1965   TRANSURETHRAL RESECTION OF BLADDER TUMOR WITH MITOMYCIN-C N/A 01/06/2021   Procedure: TRANSURETHRAL RESECTION OF BLADDER TUMOR WITH GEMCITIBINE IN PACU;  Surgeon: Bjorn Pippin, MD;  Location: [facility];  Service:  Urology;  Laterality: N/A;   TRANSURETHRAL RESECTION OF BLADDER TUMOR WITH MITOMYCIN-C N/A 09/01/2021   Procedure: TRANSURETHRAL RESECTION OF BLADDER TUMOR WITH POST OPERATIVE INSTILLATION OF GEMCITABINE;  Surgeon: Bjorn Pippin, MD;  Location: [facility];  Service: Urology;  Laterality: N/A;   TRANSURETHRAL RESECTION OF BLADDER TUMOR WITH MITOMYCIN-C Bilateral 11/13/2022   Procedure: CYSTOSCOPY TRANSURETHRAL RESECTION OF  BLADDER TUMOR BILATERAL RETROGRADES WITH INSTILL GEMCTABINE;  Surgeon: Bjorn Pippin, MD;  [facility];  Service: Urology;  Laterality: Bilateral;  1 HR FOR CASE    Allergies: Allergies as of 07/17/2023 - Review Complete 07/15/2023  Allergen Reaction Noted   Tamsulosin Other (See Comments)    Codeine Itching and Rash 07/20/2020   Wound dressing adhesive Itching and Other (See Comments) 09/16/2021    Medications:  Current Outpatient Medications:    alfuzosin (UROXATRAL) 10 MG 24 hr tablet, Take 10 mg by mouth daily., Disp: , Rfl:    amLODipine (NORVASC) 5 MG tablet, Take 1 tablet (5 mg total) by mouth daily., Disp: 90 tablet, Rfl: 3   Ascorbic Acid (VITAMIN C) 1000 MG tablet, Take 1,000 mg by mouth once a week., Disp: , Rfl:    aspirin EC 81 MG tablet, Take 1 tablet (81 mg total) by mouth daily. (Patient not taking: Reported on 07/15/2023), Disp: 100 tablet, Rfl: 3   blood glucose meter kit and supplies KIT, Use to test blood sugar once a day. DX: E11.9, Disp: 1 each, Rfl: 0   Blood Glucose Monitoring Suppl (CONTOUR NEXT MONITOR) w/Device KIT, 1 Device by Does not apply route daily., Disp: 1 kit, Rfl: 0   chlorproMAZINE (THORAZINE) 10 MG tablet, Take 1 tablet (10 mg total) by mouth 3 (three) times daily as needed., Disp: 10 tablet, Rfl: 0   cholecalciferol (VITAMIN D) 1000 UNITS tablet, Take 1,000 Units by mouth daily., Disp: , Rfl:    clobetasol (TEMOVATE) 0.05 % external solution, Apply 1 Application topically 2 (two) times daily as needed. Itchy scalp, Disp: 50 mL, Rfl: 3   CONTOUR NEXT TEST test strip, USE AS DIRECTED, Disp: 100 strip, Rfl: 5   cyclobenzaprine (FLEXERIL) 5 MG tablet, Take 5 mg by mouth 3 (three) times daily as needed., Disp: , Rfl:    finasteride (PROSCAR) 5 MG tablet, TAKE 1 TABLET BY MOUTH EVERY DAY, Disp: 90 tablet, Rfl: 3   gabapentin (NEURONTIN) 100 MG capsule, Take 1 capsule (100 mg total) by mouth 4 (four) times daily as needed., Disp: 120 capsule, Rfl: 3    glimepiride (AMARYL) 4 MG tablet, Take 1 tablet (4 mg total) by mouth in the morning and at bedtime., Disp: 180 tablet, Rfl: 3   HYDROcodone-acetaminophen (NORCO) 10-325 MG tablet, Take 1 tablet by mouth 2 (two) times daily as needed for severe pain., Disp: 40 tablet, Rfl: 0   ibuprofen (ADVIL) 800 MG tablet, TAKE 1 TABLET (800 MG TOTAL) BY MOUTH DAILY AS NEEDED FOR MODERATE PAIN., Disp: 30 tablet, Rfl: 0   ketoconazole (NIZORAL) 2 % cream, Apply 1 Application topically daily as needed for irritation., Disp: , Rfl:    Lancets MISC, Use to check blood sugar once per day., Disp: 100 each, Rfl: 3   metFORMIN (GLUCOPHAGE) 500 MG tablet, Take 1 tablet (500 mg total) by mouth 2 (two) times daily with a meal., Disp: 180 tablet, Rfl: 3   olmesartan (BENICAR) 40 MG tablet, TAKE 1 TABLET BY MOUTH EVERY DAY, Disp: 90 tablet, Rfl: 3   ondansetron (ZOFRAN-ODT) 4 MG disintegrating tablet, Take  1 tablet (4 mg total) by mouth every 8 (eight) hours as needed for nausea or vomiting., Disp: 20 tablet, Rfl: 0   potassium chloride SA (KLOR-CON) 20 MEQ tablet, Take 1 tablet (20 mEq total) by mouth daily. (Patient taking differently: Take 20 mEq by mouth 2 (two) times a week.), Disp: 90 tablet, Rfl: 11   RYBELSUS 14 MG TABS, Take 1 tablet by mouth daily., Disp: , Rfl:    tadalafil (CIALIS) 5 MG tablet, Take 5 mg by mouth daily., Disp: , Rfl:  No current facility-administered medications for this visit.  Facility-Administered Medications Ordered in Other Visits:    gemcitabine (GEMZAR) chemo syringe for bladder instillation 2,000 mg, 2,000 mg, Bladder Instillation, Once, Bjorn Pippin, MD  Social History: Social History   Tobacco Use   Smoking status: Every Day    Current packs/day: 0.25    Average packs/day: 0.3 packs/day for 30.0 years (7.5 ttl pk-yrs)    Types: Cigarettes   Smokeless tobacco: Never   Tobacco comments:    1 pack every 2 days  Vaping Use   Vaping status: Never Used  Substance Use Topics    Alcohol use: Not Currently   Drug use: Yes    Types: Marijuana    Family Medical History: Family History  Problem Relation Age of Onset   Lymphoma Mother    Colon cancer Mother 37   Colon cancer Paternal Aunt    Esophageal cancer Neg Hx    Rectal cancer Neg Hx    Stomach cancer Neg Hx     Physical Examination: There were no vitals filed for this visit.  General: Patient is in no apparent distress. Attention to examination is appropriate.  Neck:   Supple.  Full range of motion.  Respiratory: Patient is breathing without any difficulty.   NEUROLOGICAL:     Awake, alert, oriented to person, place, and time.  Speech is clear and fluent.   Cranial Nerves: Pupils equal round and reactive to light.  Facial tone is symmetric.  Facial sensation is symmetric. Shoulder shrug is symmetric. Tongue protrusion is midline.    Strength: Side Biceps Triceps Deltoid Interossei Grip Wrist Ext. Wrist Flex.  R 5 5 5 5 5 5 5   L 5 5 5 5 5 5 5    Side Iliopsoas Quads Hamstring PF DF EHL  R 5 5 5 5 5 5   L 5 5 5 5 5 5    Reflexes are ***2+ and symmetric at the biceps, triceps, brachioradialis, patella and achilles.   Hoffman's is absent. Clonus is absent  Bilateral upper and lower extremity sensation is intact to light touch ***.     No evidence of dysmetria noted.  Gait is normal.    Imaging: *** I have personally reviewed the images and agree with the above interpretation.  Medical Decision Making/Assessment and Plan: Mr. Capp is a pleasant 67 y.o. male with ***  There are no diagnoses linked to this encounter.   Thank you for involving me in the care of this patient.    Lovenia Kim MD/MSCR Neurosurgery

## 2023-07-18 ENCOUNTER — Ambulatory Visit: Payer: Medicare Other | Admitting: Neurology

## 2023-07-18 DIAGNOSIS — R202 Paresthesia of skin: Secondary | ICD-10-CM | POA: Diagnosis not present

## 2023-07-18 DIAGNOSIS — G540 Brachial plexus disorders: Secondary | ICD-10-CM

## 2023-07-18 NOTE — Procedures (Signed)
[facility] Neurology  [address] [place], [address]  [place], [place] [address] Tel: [phone] Fax: [phone] Test Date:  07/18/2023  Patient: [name] [name] DOB: 1956/08/17 Physician: Nita Sickle, DO  Sex: Male Height: 5\' 6"  Ref Phys: Ernestine Mcmurray, MD  ID#: [id number]   Technician:    History: This is a 67 year old man referred for evaluation of right upper extremity weakness and paresthesias.  NCV & EMG Findings: Extensive electrodiagnostic testing of the right upper extremity and additional studies of the left shows:  Right median and radial sensory responses show reduced amplitude (R8.1, R5.3 V).  Right ulnar sensory response shows prolonged latency (3.3 ms) and reduced amplitude (4.0 V).  Right medial antebrachial cutaneous sensory response is absent.  Right lateral antebrachial cutaneous sensory response is symmetric and within normal limits.  Left median, ulnar, radial, and medial antebrachial cutaneous sensory responses are within normal limits. Right median motor response is asymmetrically reduced, albeit within normal limits.  Right ulnar and radial motor responses show prolonged latency, reduced amplitude, and slowed conduction velocity along the course of the nerve.  Left median and radial motor responses are within normal limits.  Left ulnar motor response shows slowed conduction velocity across the elbow. Right ulnar F wave is within normal limits. In the right upper extremity, severe chronic motor axon loss changes are seen affecting the C7, C8, and T1 myotomes with active denervation involving C8 innervated muscles.  Fibrillation potentials are not seen in the cervical paraspinal muscles. In the left upper extremity, chronic motor axonal loss changes are seen affecting the C7 myotome, without accompanying active denervation.  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 Additionally, there is evidence of a chronic C7 radiculopathy (mild) and left ulnar neuropathy across the elbow (very mild).    ___________________________ Nita Sickle, DO    Nerve Conduction Studies   Stim Site NR Peak (ms) Norm Peak (ms) O-P Amp (V) Norm O-P Amp  Left Lat Ante Brach Cutan Anti Sensory (Lat Forearm)  32 C  Lat Biceps    2.5 <2.8 10.1 >10  Right Lat Ante Brach Cutan Anti Sensory (Lat Forearm)  32 C  Lat Biceps    2.5 <2.8 10.5 >10  Left Med Ante Brach Cutan Anti Sensory (Med Forearm)  32 C  Elbow    1.8  12.7   Right Med Ante Brach Cutan Anti Sensory (Med Forearm)  32 C  Elbow *NR      Left Median Anti Sensory (2nd Digit)  32 C  Wrist    3.4 <3.8 15.3 >10  Right Median Anti Sensory (2nd Digit)  32 C  Wrist    3.8 <3.8 *8.1 >10  Left Radial Anti Sensory (Base 1st Digit)  32 C  Wrist    2.5 <2.8 14.9 >10  Right Radial Anti Sensory (Base 1st Digit)  32 C  Wrist    2.3 <2.8 *5.3 >10  Left Ulnar Anti Sensory (5th Digit)  32 C  Wrist    3.0 <3.2 6.8 >5  Right Ulnar Anti Sensory (5th Digit)  32 C  Wrist    *3.3 <3.2 *4.0 >5     Stim Site NR Onset (ms) Norm Onset (ms) O-P Amp (mV) Norm O-P Amp Site1 Site2 Delta-0 (ms) Dist (cm) Vel (m/s) Norm Vel (m/s)  Left Median Motor (Abd Poll Brev)  32 C  Wrist    3.8 <4.0 8.7 >5  Elbow Wrist 6.0 31.0 52 >50  Elbow    9.8  8.2         Right Median Motor (Abd Poll Brev)  32 C  Wrist    4.0 <4.0 5.7 >5 Elbow Wrist 5.8 32.0 55 >50  Elbow    9.8  5.0         Left Radial Motor (Ext Ind Prop)  32 C  7cm    3.1 <3.1 5.7 >5 ACF 7cm 1.9 10.0 53 >50  ACF    5.0  4.7  Below spiral groove ACF 1.2 8.0 67   Below spiral groove    6.2  4.6  Above spiral groove Below spiral groove 0.4 3.0 75   Above spiral groove    6.6  4.5         Right Radial Motor (Ext Ind Prop)  32 C  7cm    *4.0 <3.1 *1.5 >5 ACF 7cm 3.2 10.0 *31 >50  ACF    7.2  0.9  Below spiral groove ACF 3.3 9.0 27   Below spiral  groove    10.5  0.8  Above spiral groove Below spiral groove 0.9 3.0 33   Above spiral groove    11.4  0.6         Left Ulnar Motor (Abd Dig Minimi)  32 C  Wrist    2.6 <3.1 7.6 >7 B Elbow Wrist 4.7 24.0 51 >50  B Elbow    7.3  6.5  A Elbow B Elbow 2.9 10.0 *34 >50  A Elbow    10.2  6.2         Right Ulnar Motor (Abd Dig Minimi)  32 C  Wrist    *4.1 <3.1 *4.2 >7 B Elbow Wrist 6.1 23.0 *38 >50  B Elbow    10.2  2.2  A Elbow B Elbow 2.3 10.0 *43 >50  A Elbow    12.5  1.9          Electromyography   Side Muscle Ins.Act Fibs Fasc Recrt Amp Dur Poly Activation Comment  Right 1stDorInt Nml *1+ Nml *2- *1+ *1+ *1+ Nml N/A  Right Abd Poll Brev Nml Nml Nml *2- *1+ *1+ *1+ Nml N/A  Right ABD Dig Min Nml Nml Nml *2- *1+ *1+ *1+ Nml N/A  Right FlexPolLong Nml Nml Nml *3- *1+ *1+ *1+ Nml N/A  Right Ext Indicis Nml *1+ Nml *SMU *2+ *2+ *2+ Nml N/A  Right PronatorTeres Nml Nml Nml Nml Nml Nml Nml Nml N/A  Right Biceps Nml Nml Nml Nml Nml Nml Nml Nml N/A  Right Triceps Nml Nml Nml *1- *1+ *1+ *1+ Nml N/A  Right Deltoid Nml Nml Nml Nml Nml Nml Nml Nml N/A  Right Infraspinatus Nml Nml Nml Nml Nml Nml Nml Nml N/A  Right Cervical Parasp Low Nml Nml Nml Nml Nml Nml Nml Nml N/A  Right ExtCarRadLong Nml Nml Nml *2- *1+ *1+ *1+ Nml N/A  Right Ext Digitorum Nml *1+ Nml *SMU *1+ *1+ *1+ Nml N/A  Right BrachioRad Nml Nml Nml Nml Nml Nml Nml Nml N/A  Left 1stDorInt Nml Nml Nml Nml Nml Nml Nml Nml N/A  Left ABD Dig Min Nml Nml Nml Nml Nml Nml Nml Nml N/A  Left FlexPolLong Nml Nml Nml Nml Nml Nml Nml Nml N/A  Left Ext Indicis Nml Nml Nml Nml Nml Nml Nml Nml N/A  Left PronatorTeres Nml Nml Nml *1- *1+ *1+ *1+ Nml N/A  Left Biceps  Nml Nml Nml Nml Nml Nml Nml Nml N/A  Left Triceps Nml Nml Nml *1- *1+ *1+ *1+ Nml N/A  Left Deltoid Nml Nml Nml Nml Nml Nml Nml Nml N/A      Waveforms:

## 2023-07-19 ENCOUNTER — Encounter: Payer: Self-pay | Admitting: Neurology

## 2023-07-19 ENCOUNTER — Other Ambulatory Visit: Payer: Self-pay | Admitting: Neurosurgery

## 2023-07-19 DIAGNOSIS — G540 Brachial plexus disorders: Secondary | ICD-10-CM

## 2023-07-25 ENCOUNTER — Other Ambulatory Visit: Payer: Self-pay | Admitting: Internal Medicine

## 2023-07-30 ENCOUNTER — Ambulatory Visit: Payer: Medicare Other | Admitting: Internal Medicine

## 2023-07-31 ENCOUNTER — Other Ambulatory Visit (INDEPENDENT_AMBULATORY_CARE_PROVIDER_SITE_OTHER): Payer: Medicare Other

## 2023-07-31 ENCOUNTER — Ambulatory Visit: Payer: Medicare Other | Admitting: Neurology

## 2023-07-31 ENCOUNTER — Encounter: Payer: Self-pay | Admitting: Neurology

## 2023-07-31 VITALS — BP 174/94 | HR 69 | Ht 66.0 in | Wt 186.0 lb

## 2023-07-31 DIAGNOSIS — G545 Neuralgic amyotrophy: Secondary | ICD-10-CM

## 2023-07-31 LAB — C-REACTIVE PROTEIN: CRP: <1 mg/dL (ref 0.5–20.0)

## 2023-07-31 LAB — B12 AND FOLATE PANEL
Folate: 7.3 ng/mL (ref >5.9)
Vitamin B-12: 389 pg/mL (ref 211–911)

## 2023-07-31 LAB — SEDIMENTATION RATE: Sed Rate: 7 mm/h (ref 0–20)

## 2023-07-31 NOTE — Progress Notes (Signed)
[facility] Neurology Division Clinic Note - Initial Visit   Date: 07/31/2023   [name] [name] MRN: [id number] DOB: 1956/09/14   Dear Dr. Katrinka Blazing:  Thank you for your kind referral of [name] [name] for consultation of right hand weakness. Although his history is well known to you, please allow [place] to reiterate it for the purpose of our medical record. The patient was accompanied to the clinic by self.    [name] [name] is a 67 y.o. ambidextrousmale with diabetes mellitus, hyperlipidemia, hypertension, bladder cancer, CAD, right renal cancer, and GERD presenting for evaluation of right hand weakness.   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  EMG findings reviewed with patient which is most suggestive of brachial plexopathy affecting the middle and inferior trunks, resulting his right hand weakness, especially with wrist and finger extension.  History of pain followed by weakness also fits neuralgic amyotrophy.   - Check ESR, CRP, vitamin B12, folate, vitamin B1 SPEP with IFE, copper, ANA  - MRI brachial plexus wwo contrast  - Start occupational therapy  2.  Diabetic neuropathy affecting the feet, mild.   Return to clinic in 3 months, or sooner as needed  ------------------------------------------------------------- History of present illness: Around July, he began having right shoulder pain, sharp and throbbing.  He recalls having to sleep in a recliner for several weeks.  His pain eventually resolved, but then he noticed weakness in the right hand, such that he is unable to extend his wrist or fingers.  He takes gabapentin 300mg  three times daily which helps with arm tingling.  Prior testing includes MRI cervical spine which shows disc herniation at right C5-6.  NCS/EMG performed here is consistent with right brachial plexopathy affecting primarily the middle and inferior trunks.  He is here is for further evaluation.   He  reports having a similar problem in the neck in the late 1980s, which was treated conservatively.   Out-side paper records, electronic medical record, and images have been reviewed where available and summarized as:  MRI cervical spine wo contrast 07/03/2023: 1. 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 2. C5-6: Spondylosis with endplate osteophytes and protruding disc material more prominent in the right posterolateral direction. Effacement of the subarachnoid space with indentation of the cord, particularly on the right. AP diameter of the canal in the midline 7.4 mm. Foraminal stenosis right worse than left. In particular, the right C6 nerve root is likely affected. 3. C6-7: Endplate osteophytes and bulging of the disc more prominent towards the right. Moderate foraminal narrowing on the right that could possibly affect the C7 nerve. This is not as severe as the level above.  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 Additionally, there is evidence of a chronic C7 radiculopathy (mild) and left ulnar neuropathy across the elbow (very mild).   Lab Results  Component Value Date   HGBA1C 8.5 (H) 04/22/2023   Lab Results  Component Value Date   VITAMINB12 584 02/12/2023   Lab Results  Component Value Date   TSH 0.64 03/06/2021   Lab Results  Component Value Date   ESRSEDRATE 4 01/08/2012    Past Medical History:  Diagnosis Date   Benign localized prostatic hyperplasia with lower urinary tract symptoms (LUTS)    urologist--- dr Annabell Howells   Bladder cancer [facility])  urologist---- dr Annabell Howells---  s/p TURBT 11/2020; 03/ 2022   Coronary artery disease    mild   DDD (degenerative disc disease), cervical    From 1980s   Diabetes mellitus type II    followed by pcp--    (01-05-2021  per pt checks blood sugar three times per week,  fasting sugar --- 130--140_   Difficult intubation 07/28/2012   partial nephrectomy done @[facility]  07-28-2012  found to have anterior larynx , diffcult intubation, referred to anesthesia record in epic    Dyslipidemia    ED (erectile dysfunction)    Elevated PSA    GERD (gastroesophageal reflux disease)    History of adenomatous polyp of colon    History of kidney stones    History of prostatitis 2008   History of renal carcinoma urologist--- dr Annabell Howells   07-28-2012 for renal neoplasm  s/p right partal nephrectomy    Hypertension    followed by pcp   Osteoarthritis    Back   Renal carcinoma, right [facility])     Past Surgical History:  Procedure Laterality Date   CATARACT EXTRACTION     COLONOSCOPY N/A 03/03/2013   Procedure: COLONOSCOPY;  Surgeon: Hilarie Fredrickson, MD;  Location: [facility];  Service: Endoscopy;  Laterality: N/A;   COLONOSCOPY WITH PROPOFOL N/A 09/29/2019   Procedure: COLONOSCOPY WITH PROPOFOL;  Surgeon: Hilarie Fredrickson, MD;  Location: [facility];  Service: Endoscopy;  Laterality: N/A;   CYSTOSCOPY WITH BIOPSY N/A 12/09/2020   Procedure: CYSTOSCOPY WITH TURBT, BIOPSY AND FULGURATION;  Surgeon: Bjorn Pippin, MD;  Location: [facility];  Service: Urology;  Laterality: N/A;  REQUESTING 45 MINS   EXTRACORPOREAL SHOCK WAVE LITHOTRIPSY  01-10-2015;  03-03-2012  @[facility]    POLYPECTOMY  09/29/2019   Procedure: POLYPECTOMY;  Surgeon: Hilarie Fredrickson, MD;  Location: [facility];  Service: Endoscopy;;   ROBOT ASSISTED LAPAROSCOPIC PARTIAL NEPHRECTOMY  07-28-2012   DR Laverle Patter  @[facility]    TONSILLECTOMY  1965   TRANSURETHRAL RESECTION OF BLADDER TUMOR WITH MITOMYCIN-C N/A 01/06/2021   Procedure: TRANSURETHRAL RESECTION OF BLADDER TUMOR WITH GEMCITIBINE IN PACU;  Surgeon: Bjorn Pippin, MD;  Location: [facility];  Service: Urology;  Laterality: N/A;   TRANSURETHRAL RESECTION OF BLADDER TUMOR WITH MITOMYCIN-C N/A 09/01/2021    Procedure: TRANSURETHRAL RESECTION OF BLADDER TUMOR WITH POST OPERATIVE INSTILLATION OF GEMCITABINE;  Surgeon: Bjorn Pippin, MD;  Location: [facility];  Service: Urology;  Laterality: N/A;   TRANSURETHRAL RESECTION OF BLADDER TUMOR WITH MITOMYCIN-C Bilateral 11/13/2022   Procedure: CYSTOSCOPY TRANSURETHRAL RESECTION OF BLADDER TUMOR BILATERAL RETROGRADES WITH INSTILL GEMCTABINE;  Surgeon: Bjorn Pippin, MD;  Location: [facility];  Service: Urology;  Laterality: Bilateral;  1 HR FOR CASE     Medications:  Outpatient Encounter Medications as of 07/31/2023  Medication Sig Note   amLODipine (NORVASC) 5 MG tablet Take 1 tablet (5 mg total) by mouth daily.    Ascorbic Acid (VITAMIN C) 1000 MG tablet Take 1,000 mg by mouth once a week.    aspirin EC 81 MG tablet Take 1 tablet (81 mg total) by mouth daily. (Patient taking differently: Take 81 mg by mouth daily. Patient takes every now and then)    blood glucose meter kit and supplies KIT Use to test blood sugar once a day. DX: E11.9    Blood Glucose Monitoring Suppl (CONTOUR NEXT MONITOR) w/Device KIT 1 Device by Does not apply route daily.    cholecalciferol (VITAMIN D) 1000 UNITS tablet Take 1,000 Units by mouth  daily.    clobetasol (TEMOVATE) 0.05 % external solution Apply 1 Application topically 2 (two) times daily as needed. Itchy scalp    CONTOUR NEXT TEST test strip USE AS DIRECTED    cyclobenzaprine (FLEXERIL) 5 MG tablet TAKE 1 TABLET BY MOUTH THREE TIMES A DAY AS NEEDED FOR MUSCLE SPASMS    gabapentin (NEURONTIN) 100 MG capsule Take 1 capsule (100 mg total) by mouth 4 (four) times daily as needed.    glimepiride (AMARYL) 4 MG tablet Take 1 tablet (4 mg total) by mouth in the morning and at bedtime.    HYDROcodone-acetaminophen (NORCO) 10-325 MG tablet Take 1 tablet by mouth 2 (two) times daily as needed for severe pain.    ibuprofen (ADVIL) 800 MG tablet TAKE 1 TABLET (800 MG TOTAL) BY MOUTH DAILY AS NEEDED FOR MODERATE PAIN.     ketoconazole (NIZORAL) 2 % cream Apply 1 Application topically daily as needed for irritation.    Lancets MISC Use to check blood sugar once per day.    metFORMIN (GLUCOPHAGE) 500 MG tablet Take 1 tablet (500 mg total) by mouth 2 (two) times daily with a meal.    olmesartan (BENICAR) 40 MG tablet TAKE 1 TABLET BY MOUTH EVERY DAY    potassium chloride SA (KLOR-CON) 20 MEQ tablet Take 1 tablet (20 mEq total) by mouth daily. (Patient taking differently: Take 20 mEq by mouth 2 (two) times a week.) 02/02/2023: Occasionally    RYBELSUS 14 MG TABS Take 1 tablet by mouth daily.    tadalafil (CIALIS) 5 MG tablet Take 5 mg by mouth daily.    [DISCONTINUED] alfuzosin (UROXATRAL) 10 MG 24 hr tablet Take 10 mg by mouth daily. (Patient not taking: Reported on 07/31/2023)    [DISCONTINUED] chlorproMAZINE (THORAZINE) 10 MG tablet Take 1 tablet (10 mg total) by mouth 3 (three) times daily as needed. (Patient not taking: Reported on 07/31/2023)    [DISCONTINUED] finasteride (PROSCAR) 5 MG tablet TAKE 1 TABLET BY MOUTH EVERY DAY (Patient not taking: Reported on 07/31/2023)    [DISCONTINUED] ondansetron (ZOFRAN-ODT) 4 MG disintegrating tablet Take 1 tablet (4 mg total) by mouth every 8 (eight) hours as needed for nausea or vomiting. (Patient not taking: Reported on 07/31/2023)    Facility-Administered Encounter Medications as of 07/31/2023  Medication   gemcitabine (GEMZAR) chemo syringe for bladder instillation 2,000 mg    Allergies:  Allergies  Allergen Reactions   Tamsulosin Other (See Comments)    Unable to perform sexually   Codeine Itching and Rash   Wound Dressing Adhesive Itching and Other (See Comments)    Band-Aid adhesive    Family History: Family History  Problem Relation Age of Onset   Lymphoma Mother    Colon cancer Mother 64   Dementia Father    Colon cancer Paternal Aunt    Esophageal cancer Neg Hx    Rectal cancer Neg Hx    Stomach cancer Neg Hx     Social History: Social History    Tobacco Use   Smoking status: Every Day    Current packs/day: 0.25    Average packs/day: 0.3 packs/day for 30.0 years (7.5 ttl pk-yrs)    Types: Cigarettes   Smokeless tobacco: Never   Tobacco comments:    1 pack every 2 days  Vaping Use   Vaping status: Never Used  Substance Use Topics   Alcohol use: Not Currently   Drug use: Yes    Types: Marijuana    Comment: Smokes marijuana every day  Social History   Social History Narrative   Occupation: printerCurrent smokerRegular exercise- noMarried      Are you right handed or left handed? Both right and left    Are you currently employed ?    What is your current occupation?   Do you live at home alone? No    Who lives with you? Wife    What type of home do you live in: 1 story or 2 story? Lives in one story home       Vital Signs:  BP (!) 176/96 Comment: in pain and didn't take bp medication  Pulse 69   Ht 5\' 6"  (1.676 m)   Wt 186 lb (84.4 kg)   SpO2 99%   BMI 30.02 kg/m     Neurological Exam: MENTAL STATUS including orientation to time, place, person, recent and remote memory, attention span and concentration, language, and fund of knowledge is normal.  Speech is not dysarthric.  CRANIAL NERVES: II:  No visual field defects.     III-IV-VI: Pupils equal round and reactive to light.  Normal conjugate, extra-ocular eye movements in all directions of gaze.  No nystagmus.  No ptosis.   V:  Normal facial sensation.    VII:  Normal facial symmetry and movements.   VIII:  Normal hearing and vestibular function.   IX-X:  Normal palatal movement.   XI:  Normal shoulder shrug and head rotation.   XII:  Normal tongue strength and range of motion, no deviation or fasciculation.  MOTOR:  Mild ABP and forearm extensor atrophy.  No fasciculations or abnormal movements.  No pronator drift.   Upper Extremity:  Right  Left  Deltoid  5/5   5/5   Biceps  5/5   5/5   Triceps  5/5   5/5   Wrist extensors  4/5   5/5   Wrist  flexors  5/5   5/5   Finger extensors  1/5   5/5   Finger flexors  4/5   5/5   Dorsal interossei  4/5   5/5   Abductor pollicis  4/5   5/5   Tone (Ashworth scale)  0  0   Lower Extremity:  Right  Left  Hip flexors  5/5   5/5   Knee flexors  5/5   5/5   Knee extensors  5/5   5/5   Dorsiflexors  5/5   5/5   Plantarflexors  5/5   5/5   Toe extensors  5/5   5/5   Toe flexors  5/5   5/5   Tone (Ashworth scale)  0  0   MSRs:                                           Right        Left brachioradialis 2+  2+  biceps 2+  2+  triceps 2+  2+  patellar 2+  2+  ankle jerk 1+  1+  Hoffman no  no  plantar response down  down   SENSORY:   Reduced vibration at the great toe bilaterally. Temperature and pin prick intact.   Rhomberg testing is normal.   COORDINATION/GAIT: Normal finger-to- nose-finger.  Intact rapid alternating movements bilaterally.   Gait narrow based and stable. Tandem and stressed gait intact.     Thank you for allowing me to participate in  patient's care.  If I can answer any additional questions, I would be pleased to do so.    Sincerely,    Shephanie Romas K. Allena Katz, DO

## 2023-08-08 ENCOUNTER — Encounter: Payer: Self-pay | Admitting: Internal Medicine

## 2023-08-08 ENCOUNTER — Ambulatory Visit: Payer: Medicare Other | Attending: Neurology | Admitting: Occupational Therapy

## 2023-08-08 ENCOUNTER — Other Ambulatory Visit: Payer: Self-pay

## 2023-08-08 DIAGNOSIS — G8193 Hemiplegia, unspecified affecting right nondominant side: Secondary | ICD-10-CM | POA: Diagnosis not present

## 2023-08-08 DIAGNOSIS — R278 Other lack of coordination: Secondary | ICD-10-CM | POA: Insufficient documentation

## 2023-08-08 DIAGNOSIS — R208 Other disturbances of skin sensation: Secondary | ICD-10-CM | POA: Insufficient documentation

## 2023-08-08 DIAGNOSIS — G545 Neuralgic amyotrophy: Secondary | ICD-10-CM | POA: Diagnosis not present

## 2023-08-08 DIAGNOSIS — M6281 Muscle weakness (generalized): Secondary | ICD-10-CM | POA: Insufficient documentation

## 2023-08-08 NOTE — Therapy (Signed)
OUTPATIENT OCCUPATIONAL THERAPY NEURO EVALUATION  Patient Name: [name] [name] MRN: [id number] DOB:[birth year], 67 y.o., male Today's Date: 08/08/2023  PCP: Tresa Garter, MD REFERRING PROVIDER: Glendale Chard, DO  END OF SESSION:  OT End of Session - 08/08/23 1033     Visit Number 1    Number of Visits 13    Date for OT Re-Evaluation 10/04/23    Authorization Type [employer] Medicare    OT Start Time 0930    OT Stop Time 1018    OT Time Calculation (min) 48 min             Past Medical History:  Diagnosis Date   Benign localized prostatic hyperplasia with lower urinary tract symptoms (LUTS)    urologist--- dr Annabell Howells   Bladder cancer [facility])    urologist---- dr Annabell Howells---  s/p TURBT 11/2020; 03/ 2022   Coronary artery disease    mild   DDD (degenerative disc disease), cervical    From 1980s   Diabetes mellitus type II    followed by pcp--   (01-05-2021  per pt checks blood sugar three times per week,  fasting sugar --- 130--140_   Difficult intubation 07/28/2012   partial nephrectomy done @[facility]  07-28-2012  found to have anterior larynx , diffcult intubation, referred to anesthesia record in epic    Dyslipidemia    ED (erectile dysfunction)    Elevated PSA    GERD (gastroesophageal reflux disease)    History of adenomatous polyp of colon    History of kidney stones    History of prostatitis 2008   History of renal carcinoma urologist--- dr Annabell Howells   07-28-2012 for renal neoplasm  s/p right partal nephrectomy    Hypertension    followed by pcp   Osteoarthritis    Back   Renal carcinoma, right [facility])    Past Surgical History:  Procedure Laterality Date   CATARACT EXTRACTION     COLONOSCOPY N/A 03/03/2013   Procedure: COLONOSCOPY;  Surgeon: Hilarie Fredrickson, MD;  Location: [facility];  Service: Endoscopy;  Laterality: N/A;   COLONOSCOPY WITH PROPOFOL N/A 09/29/2019   Procedure: COLONOSCOPY WITH PROPOFOL;  Surgeon: Hilarie Fredrickson, MD;  Location: [facility];   Service: Endoscopy;  Laterality: N/A;   CYSTOSCOPY WITH BIOPSY N/A 12/09/2020   Procedure: CYSTOSCOPY WITH TURBT, BIOPSY AND FULGURATION;  Surgeon: Bjorn Pippin, MD;  Location: [facility];  Service: Urology;  Laterality: N/A;  REQUESTING 45 MINS   EXTRACORPOREAL SHOCK WAVE LITHOTRIPSY  01-10-2015;  03-03-2012  @[facility]    POLYPECTOMY  09/29/2019   Procedure: POLYPECTOMY;  Surgeon: Hilarie Fredrickson, MD;  Location: [facility];  Service: Endoscopy;;   ROBOT ASSISTED LAPAROSCOPIC PARTIAL NEPHRECTOMY  07-28-2012   DR Laverle Patter  @[facility]    TONSILLECTOMY  1965   TRANSURETHRAL RESECTION OF BLADDER TUMOR WITH MITOMYCIN-C N/A 01/06/2021   Procedure: TRANSURETHRAL RESECTION OF BLADDER TUMOR WITH GEMCITIBINE IN PACU;  Surgeon: Bjorn Pippin, MD;  Location[facility];  Service: Urology;  Laterality: N/A;   TRANSURETHRAL RESECTION OF BLADDER TUMOR WITH MITOMYCIN-C N/A 09/01/2021   Procedure: TRANSURETHRAL RESECTION OF BLADDER TUMOR WITH POST OPERATIVE INSTILLATION OF GEMCITABINE;  Surgeon: Bjorn Pippin, MD;  Location: [facility];  Service: Urology;  Laterality: N/A;   TRANSURETHRAL RESECTION OF BLADDER TUMOR WITH MITOMYCIN-C Bilateral 11/13/2022   Procedure: CYSTOSCOPY TRANSURETHRAL RESECTION OF BLADDER TUMOR BILATERAL RETROGRADES WITH INSTILL GEMCTABINE;  Surgeon: Bjorn Pippin, MD;  Location: [facility];  Service: Urology;  Laterality: Bilateral;  1 HR  FOR CASE   Patient Active Problem List   Diagnosis Date Noted   Cervical radiculitis 04/30/2023   Diabetic neuropathy (HCC) 04/23/2023   Intractable hiccups 02/05/2023   Upper abdominal pain 02/05/2023   Gastroesophageal reflux disease 02/05/2023   EKG abnormalities 02/05/2023   Scalp itch 01/23/2023   Tetrahydrocannabinol (THC) use disorder, moderate, dependence (HCC) 01/23/2023   Polyneuropathy due to type 2 diabetes mellitus (HCC) 10/23/2022   Bladder cancer (HCC) 10/23/2022   Memory problem 07/17/2022   Leg pain, anterior, right 12/26/2021    Gross hematuria 09/16/2021   Statin declined 08/06/2021   Acute abscess of face 08/03/2021   Atherosclerosis of aorta (HCC) 04/26/2021   Coronary atherosclerosis 01/08/2021   Ascending aortic aneurysm (HCC) 01/08/2021   Bladder neck obstruction 10/20/2019   History of colonic polyps    Benign neoplasm of ascending colon    Benign neoplasm of transverse colon    Viral illness 06/16/2019   Exposure to COVID-19 virus 02/02/2019   Cerumen impaction 08/27/2018   Pilonidal cyst 01/18/2017   Neoplasm of uncertain behavior of skin 01/18/2017   Hypokalemia 12/11/2016   Tobacco use disorder 10/17/2016   Well adult exam 06/22/2016   Polycythemia, 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   Abscessed tooth 07/06/2015   Sebaceous cyst 07/06/2015   Common cold virus 12/23/2014   Infected sebaceous cyst 11/09/2014   Dyslipidemia 05/19/2013   URI, acute 09/15/2012   Strep throat 09/15/2012   Renal cell cancer (HCC) 09/02/2012   Adrenal mass (HCC) 06/03/2012   Nephrolithiasis 06/03/2012   Otitis media 01/08/2012   Dark urine 01/08/2012   Decreased vision in both eyes 01/08/2012   Onychomycosis 05/25/2011   DM type 2, controlled, with complication (HCC) 05/23/2010   Keloid scar 03/07/2010   SKIN RASH 03/07/2010   LACERATION, FINGER 09/15/2009   Osteoarthritis 08/15/2009   LOW BACK PAIN 08/15/2009   CELLULITIS AND ABSCESS OF BUTTOCK 03/29/2009   CERVICAL RADICULOPATHY 11/09/2008   WARTS, VIRAL, UNSPECIFIED 10/28/2007   Essential hypertension 06/30/2007   CLUSTER HEADACHE 06/27/2007   PSA, INCREASED 06/27/2007    ONSET DATE: June-July 2024, referral date 07/31/23  REFERRING DIAG: G54.5 (ICD-10-CM) - Neuralgic amyotrophy of right brachial plexus  THERAPY DIAG:  Hemiplegia affecting right nondominant side, unspecified etiology, unspecified hemiplegia type (HCC)  Other lack of coordination  Other disturbances of skin sensation  Muscle weakness (generalized)  Rationale for Evaluation and Treatment:  Rehabilitation  SUBJECTIVE:   SUBJECTIVE STATEMENT: Pt reports noticing June-July, having a pain across middle back and up towards R shoulder.  Pt reports that he was thinking he had pulled a muscle. X-ray and MRI completed.  Pt reports slight discomfort, reporting initially sleeping in the recliner, but now just taking the muscle relaxer and able to sleep in bed.  Pt reports numbness and tingling in R hand and long, ring, small fingers.  Pt accompanied by: self  PERTINENT HISTORY: diabetes mellitus, hyperlipidemia, hypertension, bladder cancer, CAD, right renal cancer, and GERD presenting for evaluation of right hand weakness.   PRECAUTIONS: None  WEIGHT BEARING RESTRICTIONS: No  PAIN:  Are you having pain? No  FALLS: Has patient fallen in last 6 months? No, "had some close calls"  LIVING ENVIRONMENT: Lives with: lives with their family (spouse and 69 yo grandson) Lives in: House/apartment Stairs: Yes: External: ramp to back entrance, front entrance has 5-6 steps Has following equipment at home: Ramped entry, comfort height toilet  PLOF: Independent and Independent with basic ADLs  PATIENT GOALS: to be able to open/close  my hand and use fingers  OBJECTIVE:  Note: Objective measures were completed at Evaluation unless otherwise noted.  HAND DOMINANCE: Ambidextrous, writes and eat with L hand, bats/throws with R hand  ADLs: Overall ADLs: "I've just had to learn how to do things differently" Transfers/ambulation related to ADLs: Independent Eating: Mod I Grooming: Mod I UB Dressing: difficulty finding arm hole on long sleeve shirts LB Dressing: difficulty with getting zipper down on jeans Toileting: Independent Bathing: Independent Tub Shower transfers: Independent Equipment: none  IADLs: Light housekeeping: Mod I Meal Prep: Mod I,  "I've just had to learn how to do things differently" utilizing 2 hand chop when making BBQ Community mobility: driving Medication  management: increased time  MOBILITY STATUS: Independent  POSTURE COMMENTS:  No Significant postural limitations  ACTIVITY TOLERANCE: Activity tolerance: WFL for tasks assessed  FUNCTIONAL OUTCOME MEASURES: Quick Dash: 13.63 impairment The Brachial Assessment Tool: 77/93 Subscale 1: dressing items: 20/24 Subscale 2: arm and hand items: 42/51 Subscale 3: no hand items 15/18  UPPER EXTREMITY ROM:  decreased wrist extension against gravity, decresaed finger extension long, ring, and small finger  Active ROM Right eval Left eval  Shoulder flexion [facility]  Shoulder abduction [facility]  Shoulder adduction    Shoulder extension    Shoulder internal rotation [facility]  Shoulder external rotation [facility]  Elbow flexion [facility] WFL  Elbow extension [facility] WFL  Wrist flexion [facility] WFL  Wrist extension [facility] WFL  Wrist ulnar deviation    Wrist radial deviation    Wrist pronation WFL WFL  Wrist supination WFL WFL  (Blank rows = not tested)  UPPER EXTREMITY MMT:     MMT Right eval Left eval  Shoulder flexion 5/5 5/5  Shoulder abduction 5/5 5/5  Shoulder adduction    Shoulder extension    Shoulder internal rotation 5/5 5/5  Shoulder external rotation 5/5 5/5  Middle trapezius    Lower trapezius    Elbow flexion 5/5 5/5  Elbow extension 5/5 5/5  Wrist flexion 5/5 5/5  Wrist extension 4/5 5/5  Wrist ulnar deviation    Wrist radial deviation    Wrist pronation    Wrist supination    (Blank rows = not tested)  RUE: Finger extensors 1/5  Finger flexors 4/5   Dorsal interossei 4/5   Abductor pollicis 4/5    HAND FUNCTION: Grip strength: Right: 68 lbs; Left: 21 lbs  COORDINATION: Box and Blocks:  Right 38 blocks, Left 50 blocks  SENSATION: Reporting tingling  EDEMA: NA  COGNITION: Overall cognitive status: Within functional limits for tasks assessed  OBSERVATIONS: Pt demonstrating decreased wrist extension against gravity during box and blocks assessment.  Pt able to  complete thumb opposition, however with flat fingers as opposed to rounded "O" shape.  Pt with difficulty with finger abduction and extension from table top.   TODAY'S TREATMENT:  DATE:  08/08/23  Engaged in wrist extension exercises with wrist flexion/extension against gravity and prayer stretch.  OT instructed on thumb opposition with focus on attempts at making "O" shape with each finger to thumb opposition.  Engaged in picking up and stacking cones with pt demonstrating difficulty with opening hand and ulnar deviation as needed to lift and place cup on stack.  PATIENT EDUCATION: Education details: Educated on role and purpose of OT as well as potential interventions and goals for therapy based on initial evaluation findings. Person educated: Patient Education method: Explanation, Demonstration, Verbal cues, and Handouts Education comprehension: verbalized understanding and needs further education  HOME EXERCISE PROGRAM: Access Code: [email or web address] URL: [email or web address] Date: 08/08/2023 Prepared by: [facility] - Outpatient  Rehab - [facility]  Exercises - Wrist Flexion Extension AROM with Fingers Curled and Palm Down  - 2 x daily - 2 sets - 10 reps - Thumb Opposition  - 2 x daily - 2 sets - 10 reps - Wrist Prayer Stretch  - 2 x daily - 2 sets - 10 reps   GOALS: Goals reviewed with patient? Yes  SHORT TERM GOALS: Target date: 09/06/23  Pt will be independent in ROM and strengthening HEP. Baseline: Goal status: INITIAL  2.  Pt will verbalize understanding of adaptive strategies, task modifications and/or potential AE needs to increase ease, safety, and independence w/ ADLs and IADLs. Baseline:  Goal status: INITIAL  3.  Pt will demonstrate improved UE functional use for ADLs as evidenced by increasing box/ blocks score by 4 blocks with  RUE Baseline: Right 38 blocks, Left 50 blocks Goal status: INITIAL   LONG TERM GOALS: Target date: 10/04/23  Pt will demonstrate ability to pick up small objects from table top (pen, coin, etc) x5 with RUE mild increased time and no drops. Baseline: very hard to pick up small items Goal status: INITIAL  2.  Pt will verbalize and/or demonstrate improved ability to type/text on phone without increased pain >2/10 on pain scale. Baseline:  Goal status: INITIAL  3.  Pt will demonstrate ability to utilize BUE together without any pain or difficulty to engage in simple meal prep tasks (buttering bread, using knife/fork to cut foods, scooping foods, etc).  Baseline:  Goal status: INITIAL  4.  Pt will demonstrate improved grip strength by 10# to allow for increased ease with use of tools and making the bed. Baseline: L: 68#, R: 21# Goal status: INITIAL  5.  Pt will report improved functional use of RUE during ADLs/IADLs as evidenced by improved score on BrAT by 5 points. Baseline: 77/93 Goal status: INITIAL  ASSESSMENT:  CLINICAL IMPRESSION: Patient is a 67 y.o. male who was seen today for occupational therapy evaluation for RUE s/p right brachial plexopathy.  Pt reports also having a herniated cervical disc with R sided weakness impacting wrist and hand function. Pt notices most difficulty with lifting fingers and mild tingling mostly in a C7 distribution at this point. Has noticed that his grip has changed somewhat but only has noticed weakness in his hand not in his arm.  Pt lives with spouse and 16yo grandson in a single level home. Pt will benefit from skilled occupational therapy services to address strength and coordination, ROM, pain management, altered sensation, GM/FM control, safety awareness, introduction of compensatory strategies/AE prn, and implementation of an HEP to improve participation and safety during ADLs and IADLs.    PERFORMANCE DEFICITS: in functional skills including  ADLs, IADLs, coordination, dexterity, sensation, ROM, strength,  pain, Fine motor control, Gross motor control, decreased knowledge of precautions, decreased knowledge of use of DME, and UE functional use and psychosocial skills including coping strategies and environmental adaptation.   IMPAIRMENTS: are limiting patient from ADLs, IADLs, and rest and sleep.   CO-MORBIDITIES: may have co-morbidities  that affects occupational performance. Patient will benefit from skilled OT to address above impairments and improve overall function.  MODIFICATION OR ASSISTANCE TO COMPLETE EVALUATION: Min-Moderate modification of tasks or assist with assess necessary to complete an evaluation.  OT OCCUPATIONAL PROFILE AND HISTORY: Detailed assessment: Review of records and additional review of physical, cognitive, psychosocial history related to current functional performance.  CLINICAL DECISION MAKING: Moderate - several treatment options, min-mod task modification necessary  REHAB POTENTIAL: Good  EVALUATION COMPLEXITY: Moderate    PLAN:  OT FREQUENCY: 2x/week (2x/week for 4 weeks and then decrease to 1x/week)  OT DURATION: 8 weeks  PLANNED INTERVENTIONS: 97168 OT Re-evaluation, 97535 self care/ADL training, 78295 therapeutic exercise, 97530 therapeutic activity, 97112 neuromuscular re-education, 97140 manual therapy, 97035 ultrasound, 97010 moist heat, 97010 cryotherapy, 97032 electrical stimulation (manual), passive range of motion, coping strategies training, patient/family education, and DME and/or AE instructions  RECOMMENDED OTHER SERVICES: NA  CONSULTED AND AGREED WITH PLAN OF CARE: Patient  PLAN FOR NEXT SESSION: Review wrist extension HEP, initiate finger ROM, grasp/release as able.   Rosalio Loud, OTR/L 08/08/2023, 10:35 AM   [facility] at [facility] [address] [place], [address] [place], [place] [address] Phone # [phone] Fax # [phone]

## 2023-08-10 LAB — PROTEIN ELECTROPHORESIS, SERUM
Albumin ELP: 4 g/dL (ref 3.8–4.8)
Alpha 1: 0.3 g/dL (ref 0.2–0.3)
Alpha 2: 0.6 g/dL (ref 0.5–0.9)
Beta 2: 0.4 g/dL (ref 0.2–0.5)
Beta Globulin: 0.4 g/dL (ref 0.4–0.6)
Gamma Globulin: 0.7 g/dL — ABNORMAL LOW (ref 0.8–1.7)
Total Protein: 6.3 g/dL (ref 6.1–8.1)

## 2023-08-10 LAB — VITAMIN B1: Vitamin B1 (Thiamine): 8 nmol/L (ref 8–30)

## 2023-08-10 LAB — IMMUNOFIXATION ELECTROPHORESIS
IgG (Immunoglobin G), Serum: 745 mg/dL (ref 600–1540)
IgM, Serum: 200 mg/dL (ref 50–300)
Immunoglobulin A: 189 mg/dL (ref 70–320)

## 2023-08-10 LAB — COPPER, SERUM: Copper: 91 ug/dL (ref 70–175)

## 2023-08-10 LAB — ANA: Anti Nuclear Antibody (ANA): NEGATIVE

## 2023-08-12 ENCOUNTER — Telehealth: Payer: Self-pay

## 2023-08-12 ENCOUNTER — Ambulatory Visit: Payer: Medicare Other | Admitting: Occupational Therapy

## 2023-08-12 DIAGNOSIS — R208 Other disturbances of skin sensation: Secondary | ICD-10-CM

## 2023-08-12 DIAGNOSIS — R278 Other lack of coordination: Secondary | ICD-10-CM | POA: Diagnosis not present

## 2023-08-12 DIAGNOSIS — M6281 Muscle weakness (generalized): Secondary | ICD-10-CM

## 2023-08-12 DIAGNOSIS — G8193 Hemiplegia, unspecified affecting right nondominant side: Secondary | ICD-10-CM

## 2023-08-12 DIAGNOSIS — G545 Neuralgic amyotrophy: Secondary | ICD-10-CM | POA: Diagnosis not present

## 2023-08-12 NOTE — Therapy (Signed)
OUTPATIENT OCCUPATIONAL THERAPY NEURO  Treatment Note  Patient Name: [name] [name] MRN: [id number] DOB:29-Jun-1956, 67 y.o., male Today's Date: 08/12/2023  PCP: Tresa Garter, MD REFERRING PROVIDER: Glendale Chard, DO  END OF SESSION:  OT End of Session - 08/12/23 0936     Visit Number 2    Number of Visits 13    Date for OT Re-Evaluation 10/04/23    Authorization Type [employer] Medicare    OT Start Time 0930    OT Stop Time 1010    OT Time Calculation (min) 40 min              Past Medical History:  Diagnosis Date   Benign localized prostatic hyperplasia with lower urinary tract symptoms (LUTS)    urologist--- dr Annabell Howells   Bladder cancer [facility])    urologist---- dr Annabell Howells---  s/p TURBT 11/2020; 03/ 2022   Coronary artery disease    mild   DDD (degenerative disc disease), cervical    From 1980s   Diabetes mellitus type II    followed by pcp--   (01-05-2021  per pt checks blood sugar three times per week,  fasting sugar --- 130--140_   Difficult intubation 07/28/2012   partial nephrectomy done @[facility]  07-28-2012  found to have anterior larynx , diffcult intubation, referred to anesthesia record in epic    Dyslipidemia    ED (erectile dysfunction)    Elevated PSA    GERD (gastroesophageal reflux disease)    History of adenomatous polyp of colon    History of kidney stones    History of prostatitis 2008   History of renal carcinoma urologist--- dr Annabell Howells   07-28-2012 for renal neoplasm  s/p right partal nephrectomy    Hypertension    followed by pcp   Osteoarthritis    Back   Renal carcinoma, right [facility])    Past Surgical History:  Procedure Laterality Date   CATARACT EXTRACTION     COLONOSCOPY N/A 03/03/2013   Procedure: COLONOSCOPY;  Surgeon: Hilarie Fredrickson, MD;  Location: [facility];  Service: Endoscopy;  Laterality: N/A;   COLONOSCOPY WITH PROPOFOL N/A 09/29/2019   Procedure: COLONOSCOPY WITH PROPOFOL;  Surgeon: Hilarie Fredrickson, MD;  Location: [facility];   Service: Endoscopy;  Laterality: N/A;   CYSTOSCOPY WITH BIOPSY N/A 12/09/2020   Procedure: CYSTOSCOPY WITH TURBT, BIOPSY AND FULGURATION;  Surgeon: Bjorn Pippin, MD;  Location: [facility];  Service: Urology;  Laterality: N/A;  REQUESTING 45 MINS   EXTRACORPOREAL SHOCK WAVE LITHOTRIPSY  01-10-2015;  03-03-2012  @[facility]    POLYPECTOMY  09/29/2019   Procedure: POLYPECTOMY;  Surgeon: Hilarie Fredrickson, MD;  Location: [facility];  Service: Endoscopy;;   ROBOT ASSISTED LAPAROSCOPIC PARTIAL NEPHRECTOMY  07-28-2012   DR Laverle Patter  @[facility]    TONSILLECTOMY  1965   TRANSURETHRAL RESECTION OF BLADDER TUMOR WITH MITOMYCIN-C N/A 01/06/2021   Procedure: TRANSURETHRAL RESECTION OF BLADDER TUMOR WITH GEMCITIBINE IN PACU;  Surgeon: Bjorn Pippin, MD;  Location: [facility];  Service: Urology;  Laterality: N/A;   TRANSURETHRAL RESECTION OF BLADDER TUMOR WITH MITOMYCIN-C N/A 09/01/2021   Procedure: TRANSURETHRAL RESECTION OF BLADDER TUMOR WITH POST OPERATIVE INSTILLATION OF GEMCITABINE;  Surgeon: Bjorn Pippin, MD;  Location: [facility];  Service: Urology;  Laterality: N/A;   TRANSURETHRAL RESECTION OF BLADDER TUMOR WITH MITOMYCIN-C Bilateral 11/13/2022   Procedure: CYSTOSCOPY TRANSURETHRAL RESECTION OF BLADDER TUMOR BILATERAL RETROGRADES WITH INSTILL GEMCTABINE;  Surgeon: Bjorn Pippin, MD;  Location: [facility];  Service: Urology;  Laterality: Bilateral;  1 HR FOR CASE   Patient Active Problem List   Diagnosis Date Noted   Cervical radiculitis 04/30/2023   Diabetic neuropathy (HCC) 04/23/2023   Intractable hiccups 02/05/2023   Upper abdominal pain 02/05/2023   Gastroesophageal reflux disease 02/05/2023   EKG abnormalities 02/05/2023   Scalp itch 01/23/2023   Tetrahydrocannabinol (THC) use disorder, moderate, dependence (HCC) 01/23/2023   Polyneuropathy due to type 2 diabetes mellitus (HCC) 10/23/2022   Bladder cancer (HCC) 10/23/2022   Memory problem 07/17/2022   Leg pain, anterior, right 12/26/2021    Gross hematuria 09/16/2021   Statin declined 08/06/2021   Acute abscess of face 08/03/2021   Atherosclerosis of aorta (HCC) 04/26/2021   Coronary atherosclerosis 01/08/2021   Ascending aortic aneurysm (HCC) 01/08/2021   Bladder neck obstruction 10/20/2019   History of colonic polyps    Benign neoplasm of ascending colon    Benign neoplasm of transverse colon    Viral illness 06/16/2019   Exposure to COVID-19 virus 02/02/2019   Cerumen impaction 08/27/2018   Pilonidal cyst 01/18/2017   Neoplasm of uncertain behavior of skin 01/18/2017   Hypokalemia 12/11/2016   Tobacco use disorder 10/17/2016   Well adult exam 06/22/2016   Polycythemia, 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   Abscessed tooth 07/06/2015   Sebaceous cyst 07/06/2015   Common cold virus 12/23/2014   Infected sebaceous cyst 11/09/2014   Dyslipidemia 05/19/2013   URI, acute 09/15/2012   Strep throat 09/15/2012   Renal cell cancer (HCC) 09/02/2012   Adrenal mass (HCC) 06/03/2012   Nephrolithiasis 06/03/2012   Otitis media 01/08/2012   Dark urine 01/08/2012   Decreased vision in both eyes 01/08/2012   Onychomycosis 05/25/2011   DM type 2, controlled, with complication (HCC) 05/23/2010   Keloid scar 03/07/2010   SKIN RASH 03/07/2010   LACERATION, FINGER 09/15/2009   Osteoarthritis 08/15/2009   LOW BACK PAIN 08/15/2009   CELLULITIS AND ABSCESS OF BUTTOCK 03/29/2009   CERVICAL RADICULOPATHY 11/09/2008   WARTS, VIRAL, UNSPECIFIED 10/28/2007   Essential hypertension 06/30/2007   CLUSTER HEADACHE 06/27/2007   PSA, INCREASED 06/27/2007    ONSET DATE: June-July 2024, referral date 07/31/23  REFERRING DIAG: G54.5 (ICD-10-CM) - Neuralgic amyotrophy of right brachial plexus  THERAPY DIAG:  Hemiplegia affecting right nondominant side, unspecified etiology, unspecified hemiplegia type (HCC)  Other lack of coordination  Other disturbances of skin sensation  Muscle weakness (generalized)  Rationale for Evaluation and Treatment:  Rehabilitation  SUBJECTIVE:   SUBJECTIVE STATEMENT: "My arm feels stiff today".  Pt reports that sometimes it is moving better and he feels that he is progressing but then other days it feels stiff and "uncooperative".  Pt reports similar sensations in Left arm as beginning of issues on Right.  Pt accompanied by: self  PERTINENT HISTORY: diabetes mellitus, hyperlipidemia, hypertension, bladder cancer, CAD, right renal cancer, and GERD presenting for evaluation of right hand weakness.   PRECAUTIONS: None  WEIGHT BEARING RESTRICTIONS: No  PAIN:  Are you having pain? No  FALLS: Has patient fallen in last 6 months? No, "had some close calls"  LIVING ENVIRONMENT: Lives with: lives with their family (spouse and 94 yo grandson) Lives in: House/apartment Stairs: Yes: External: ramp to back entrance, front entrance has 5-6 steps Has following equipment at home: Ramped entry, comfort height toilet  PLOF: Independent and Independent with basic ADLs  PATIENT GOALS: to be able to open/close my hand and use fingers  OBJECTIVE:  Note: Objective measures were completed at Evaluation unless otherwise noted.  HAND DOMINANCE: Ambidextrous, writes  and eat with L hand, bats/throws with R hand  ADLs: Overall ADLs: "I've just had to learn how to do things differently" Transfers/ambulation related to ADLs: Independent Eating: Mod I Grooming: Mod I UB Dressing: difficulty finding arm hole on long sleeve shirts LB Dressing: difficulty with getting zipper down on jeans Toileting: Independent Bathing: Independent Tub Shower transfers: Independent Equipment: none  IADLs: Light housekeeping: Mod I Meal Prep: Mod I,  "I've just had to learn how to do things differently" utilizing 2 hand chop when making BBQ Community mobility: driving Medication management: increased time  MOBILITY STATUS: Independent  POSTURE COMMENTS:  No Significant postural limitations  ACTIVITY TOLERANCE: Activity  tolerance: WFL for tasks assessed  FUNCTIONAL OUTCOME MEASURES: Quick Dash: 13.63 impairment The Brachial Assessment Tool: 77/93 Subscale 1: dressing items: 20/24 Subscale 2: arm and hand items: 42/51 Subscale 3: no hand items 15/18  UPPER EXTREMITY ROM:  decreased wrist extension against gravity, decresaed finger extension long, ring, and small finger  Active ROM Right eval Left eval  Shoulder flexion [facility]  Shoulder abduction [facility]  Shoulder adduction    Shoulder extension    Shoulder internal rotation [facility]  Shoulder external rotation [facility]  Elbow flexion [facility] WFL  Elbow extension [facility] WFL  Wrist flexion [facility] WFL  Wrist extension [facility] WFL  Wrist ulnar deviation    Wrist radial deviation    Wrist pronation WFL WFL  Wrist supination WFL WFL  (Blank rows = not tested)  UPPER EXTREMITY MMT:     MMT Right eval Left eval  Shoulder flexion 5/5 5/5  Shoulder abduction 5/5 5/5  Shoulder adduction    Shoulder extension    Shoulder internal rotation 5/5 5/5  Shoulder external rotation 5/5 5/5  Middle trapezius    Lower trapezius    Elbow flexion 5/5 5/5  Elbow extension 5/5 5/5  Wrist flexion 5/5 5/5  Wrist extension 4/5 5/5  Wrist ulnar deviation    Wrist radial deviation    Wrist pronation    Wrist supination    (Blank rows = not tested)  RUE: Finger extensors 1/5  Finger flexors 4/5   Dorsal interossei 4/5   Abductor pollicis 4/5    HAND FUNCTION: Grip strength: Right: 68 lbs; Left: 21 lbs  COORDINATION: Box and Blocks:  Right 38 blocks, Left 50 blocks  SENSATION: Reporting tingling  EDEMA: NA  COGNITION: Overall cognitive status: Within functional limits for tasks assessed  OBSERVATIONS: Pt demonstrating decreased wrist extension against gravity during box and blocks assessment.  Pt able to complete thumb opposition, however with flat fingers as opposed to rounded "O" shape.  Pt with difficulty with finger abduction and extension from table  top.   TODAY'S TREATMENT:                                                                                 DATE:  08/12/23 Review exercises from previous session:  Pt demonstrating good technique with prayer stretch and AROM wrist flexion/extension.  Did note pt recruiting increased radial deviation during attempts at wrist extension.  Pt reports decreased flexion in fingers impacting ease of thumb opposition with "O" shape.   Wrist/digit  ROM: engaged in ulnar deviation with forearm resting on table top and hand over edge of table to utilize gravity to assist in increased deviation and motor control.  OT encouraged pt to stabilize at forearm during finger abduction to facilitate increased ROM while decreasing compensatory wrist extension and radial deviation. Pt with decreased motor control of digits with abduction and unable to life against gravity. Ulnar nerve gliding: OT instructing pt in ulnar nerve glides at low level with focus on wrist extension and finger extension during wrist extension.  OT providing demonstration for technique and intermittent hand over hand to facilitate increased finger extension.  Educated on decreased sustained elbow flexion recommending changes in position to allow for increased movement of ulnar nerve to reduce pain, numbness, and tingling.      08/08/23  Engaged in wrist extension exercises with wrist flexion/extension against gravity and prayer stretch.  OT instructed on thumb opposition with focus on attempts at making "O" shape with each finger to thumb opposition.  Engaged in picking up and stacking cones with pt demonstrating difficulty with opening hand and ulnar deviation as needed to lift and place cup on stack.  PATIENT EDUCATION: Education details: ongoing condition specific education Person educated: Patient Education method: Explanation, Demonstration, Verbal cues, and Handouts Education comprehension: verbalized understanding and needs further  education  HOME EXERCISE PROGRAM: Access Code: [email or web address] URL: [email or web address] Date: 08/12/2023 Prepared by: [facility] - [facility]  Exercises - Wrist Flexion Extension AROM with Fingers Curled and Palm Down  - 3 x daily - 5 reps - Thumb Opposition  - 3 x daily - 5 reps - Wrist Prayer Stretch  - 3 x daily - 5 reps - Ulnar Nerve/Median Glide- Low Level  - 3 x daily - 5 reps - Ulnar Nerve Mobilization - Low Level  - 3 x daily - 5 reps - Wrist Radial Ulnar Deviation AROM  - 3 x daily - 5 reps - Finger walking  - 3 x daily - 5 reps   GOALS: Goals reviewed with patient? Yes  SHORT TERM GOALS: Target date: 09/06/23  Pt will be independent in ROM and strengthening HEP. Baseline: Goal status: IN PROGRESS  2.  Pt will verbalize understanding of adaptive strategies, task modifications and/or potential AE needs to increase ease, safety, and independence w/ ADLs and IADLs. Baseline:  Goal status: IN PROGRESS  3.  Pt will demonstrate improved UE functional use for ADLs as evidenced by increasing box/ blocks score by 4 blocks with RUE Baseline: Right 38 blocks, Left 50 blocks Goal status: IN PROGRESS   LONG TERM GOALS: Target date: 10/04/23  Pt will demonstrate ability to pick up small objects from table top (pen, coin, etc) x5 with RUE mild increased time and no drops. Baseline: very hard to pick up small items Goal status: IN PROGRESS  2.  Pt will verbalize and/or demonstrate improved ability to type/text on phone without increased pain >2/10 on pain scale. Baseline:  Goal status: IN PROGRESS  3.  Pt will demonstrate ability to utilize BUE together without any pain or difficulty to engage in simple meal prep tasks (buttering bread, using knife/fork to cut foods, scooping foods, etc).  Baseline:  Goal status: v  4.  Pt will demonstrate improved grip strength by 10# to allow for increased ease with use of tools and making the  bed. Baseline: L: 68#, R: 21# Goal status: IN PROGRESS  5.  Pt will report improved functional use of RUE  during ADLs/IADLs as evidenced by improved score on BrAT by 5 points. Baseline: 77/93 Goal status: IN PROGRESS  ASSESSMENT:  CLINICAL IMPRESSION: Pt voicing concerns about changes in sensation on LUE, comparing it to beginnings of issues on R side.  OT encouraged pt to speak with MD about his concerns.  Pt demonstrating good wrist extension in low level and improvements with forearm in neutral with shoulder and 90* flexion.  Pt tolerating further digit extension with wrist extension when added in PROM from OT.  Pt with decreased ability to complete finger abduction and extension, however with support at forearm pt able to elicit increased finger abduction.     PERFORMANCE DEFICITS: in functional skills including ADLs, IADLs, coordination, dexterity, sensation, ROM, strength, pain, Fine motor control, Gross motor control, decreased knowledge of precautions, decreased knowledge of use of DME, and UE functional use and psychosocial skills including coping strategies and environmental adaptation.    PLAN:  OT FREQUENCY: 2x/week (2x/week for 4 weeks and then decrease to 1x/week)  OT DURATION: 8 weeks  PLANNED INTERVENTIONS: 97168 OT Re-evaluation, 97535 self care/ADL training, 09323 therapeutic exercise, 97530 therapeutic activity, 97112 neuromuscular re-education, 97140 manual therapy, 97035 ultrasound, 97010 moist heat, 97010 cryotherapy, 97032 electrical stimulation (manual), passive range of motion, coping strategies training, patient/family education, and DME and/or AE instructions  RECOMMENDED OTHER SERVICES: NA  CONSULTED AND AGREED WITH PLAN OF CARE: Patient  PLAN FOR NEXT SESSION: Review nerve glides and further address finger ROM, grasp/release as able.   Rosalio Loud, OTR/L 08/12/2023, 10:14 AM   [facility] at [facility] [address] [place],  [address] [place], [place] [address] Phone # [phone] Fax # [phone]

## 2023-08-12 NOTE — Telephone Encounter (Signed)
Called patient to provide him with lab results. Patient wanted to let Dr. Allena Katz know that he is starting to feel the same symptoms on the left arm now. Patient states that his arm has been numb down to his finger tips and has numbness and tingling in forearm. Patient states he mentioned this at his visit with Dr. Allena Katz when it was just starting out but now it is getting worse. Patient is wondering if his cervical spine (C7) is causing this? Other physicians states it is related and patient thinks it is too. Patient states that all test are coming back normal and there is nothing giving him answers about his arms. Patient wants Dr. Allena Katz and his Neurosurgeon doctor in [place] to take a good look at his C7 spine because he really thinks this is the issue.   Patient stated his arms hurt and would like medication for the pain. I informed patient that it looks like he is on Gabapentin and asked if it is helping any? Patient stated that it makes him too loopy. Patent was made aware we do not prescribe narcotics at our office.

## 2023-08-13 ENCOUNTER — Other Ambulatory Visit: Payer: Self-pay | Admitting: Internal Medicine

## 2023-08-13 NOTE — Telephone Encounter (Signed)
Please offer a follow-up visit, so I can reassess him.

## 2023-08-14 ENCOUNTER — Ambulatory Visit: Payer: Medicare Other | Admitting: Occupational Therapy

## 2023-08-14 ENCOUNTER — Telehealth: Payer: Self-pay | Admitting: Neurology

## 2023-08-14 ENCOUNTER — Encounter: Payer: Self-pay | Admitting: Internal Medicine

## 2023-08-14 DIAGNOSIS — M6281 Muscle weakness (generalized): Secondary | ICD-10-CM | POA: Diagnosis not present

## 2023-08-14 DIAGNOSIS — R208 Other disturbances of skin sensation: Secondary | ICD-10-CM

## 2023-08-14 DIAGNOSIS — R278 Other lack of coordination: Secondary | ICD-10-CM

## 2023-08-14 DIAGNOSIS — G8193 Hemiplegia, unspecified affecting right nondominant side: Secondary | ICD-10-CM | POA: Diagnosis not present

## 2023-08-14 DIAGNOSIS — G545 Neuralgic amyotrophy: Secondary | ICD-10-CM | POA: Diagnosis not present

## 2023-08-14 NOTE — Telephone Encounter (Signed)
Patient is returning a call to [facility] about an appt. Dr.Patel was going to offer him 11/1 at 3pm for 40 minutes, but that appt has been taken for an EMG.

## 2023-08-14 NOTE — Telephone Encounter (Signed)
Called patient and left a message for a call back.  

## 2023-08-14 NOTE — Therapy (Signed)
OUTPATIENT OCCUPATIONAL THERAPY NEURO  Treatment Note  Patient Name: [name] [name] MRN: [id number] DOB:02/04/1956, 67 y.o., male Today's Date: 08/14/2023  PCP: Tresa Garter, MD REFERRING PROVIDER: Glendale Chard, DO  END OF SESSION:  OT End of Session - 08/14/23 1113     Visit Number 3    Number of Visits 13    Date for OT Re-Evaluation 10/04/23    Authorization Type [employer] Medicare    OT Start Time 0934    OT Stop Time 1014    OT Time Calculation (min) 40 min               Past Medical History:  Diagnosis Date   Benign localized prostatic hyperplasia with lower urinary tract symptoms (LUTS)    urologist--- dr Annabell Howells   Bladder cancer [facility])    urologist---- dr Annabell Howells---  s/p TURBT 11/2020; 03/ 2022   Coronary artery disease    mild   DDD (degenerative disc disease), cervical    From 1980s   Diabetes mellitus type II    followed by pcp--   (01-05-2021  per pt checks blood sugar three times per week,  fasting sugar --- 130--140_   Difficult intubation 07/28/2012   partial nephrectomy done @[facility]  07-28-2012  found to have anterior larynx , diffcult intubation, referred to anesthesia record in epic    Dyslipidemia    ED (erectile dysfunction)    Elevated PSA    GERD (gastroesophageal reflux disease)    History of adenomatous polyp of colon    History of kidney stones    History of prostatitis 2008   History of renal carcinoma urologist--- dr Annabell Howells   07-28-2012 for renal neoplasm  s/p right partal nephrectomy    Hypertension    followed by pcp   Osteoarthritis    Back   Renal carcinoma, right [facility])    Past Surgical History:  Procedure Laterality Date   CATARACT EXTRACTION     COLONOSCOPY N/A 03/03/2013   Procedure: COLONOSCOPY;  Surgeon: Hilarie Fredrickson, MD;  Location: [facility];  Service: Endoscopy;  Laterality: N/A;   COLONOSCOPY WITH PROPOFOL N/A 09/29/2019   Procedure: COLONOSCOPY WITH PROPOFOL;  Surgeon: Hilarie Fredrickson, MD;  Location: [facility];  Service: Endoscopy;  Laterality: N/A;   CYSTOSCOPY WITH BIOPSY N/A 12/09/2020   Procedure: CYSTOSCOPY WITH TURBT, BIOPSY AND FULGURATION;  Surgeon: Bjorn Pippin, MD;  Location: [facility];  Service: Urology;  Laterality: N/A;  REQUESTING 45 MINS   EXTRACORPOREAL SHOCK WAVE LITHOTRIPSY  01-10-2015;  03-03-2012  @[facility]    POLYPECTOMY  09/29/2019   Procedure: POLYPECTOMY;  Surgeon: Hilarie Fredrickson, MD;  Location: [facility];  Service: Endoscopy;;   ROBOT ASSISTED LAPAROSCOPIC PARTIAL NEPHRECTOMY  07-28-2012   DR Laverle Patter  @[facility]    TONSILLECTOMY  1965   TRANSURETHRAL RESECTION OF BLADDER TUMOR WITH MITOMYCIN-C N/A 01/06/2021   Procedure: TRANSURETHRAL RESECTION OF BLADDER TUMOR WITH GEMCITIBINE IN PACU;  Surgeon: Bjorn Pippin, MD;  Location: [facility];  Service: Urology;  Laterality: N/A;   TRANSURETHRAL RESECTION OF BLADDER TUMOR WITH MITOMYCIN-C N/A 09/01/2021   Procedure: TRANSURETHRAL RESECTION OF BLADDER TUMOR WITH POST OPERATIVE INSTILLATION OF GEMCITABINE;  Surgeon: Bjorn Pippin, MD;  Location: [facility];  Service: Urology;  Laterality: N/A;   TRANSURETHRAL RESECTION OF BLADDER TUMOR WITH MITOMYCIN-C Bilateral 11/13/2022   Procedure: CYSTOSCOPY TRANSURETHRAL RESECTION OF BLADDER TUMOR BILATERAL RETROGRADES WITH INSTILL GEMCTABINE;  Surgeon: Bjorn Pippin, MD;  Location: [facility];  Service: Urology;  Laterality:  Bilateral;  1 HR FOR CASE   Patient Active Problem List   Diagnosis Date Noted   Cervical radiculitis 04/30/2023   Diabetic neuropathy (HCC) 04/23/2023   Intractable hiccups 02/05/2023   Upper abdominal pain 02/05/2023   Gastroesophageal reflux disease 02/05/2023   EKG abnormalities 02/05/2023   Scalp itch 01/23/2023   Tetrahydrocannabinol (THC) use disorder, moderate, dependence (HCC) 01/23/2023   Polyneuropathy due to type 2 diabetes mellitus (HCC) 10/23/2022   Bladder cancer (HCC) 10/23/2022   Memory problem 07/17/2022   Leg pain, anterior, right  12/26/2021   Gross hematuria 09/16/2021   Statin declined 08/06/2021   Acute abscess of face 08/03/2021   Atherosclerosis of aorta (HCC) 04/26/2021   Coronary atherosclerosis 01/08/2021   Ascending aortic aneurysm (HCC) 01/08/2021   Bladder neck obstruction 10/20/2019   History of colonic polyps    Benign neoplasm of ascending colon    Benign neoplasm of transverse colon    Viral illness 06/16/2019   Exposure to COVID-19 virus 02/02/2019   Cerumen impaction 08/27/2018   Pilonidal cyst 01/18/2017   Neoplasm of uncertain behavior of skin 01/18/2017   Hypokalemia 12/11/2016   Tobacco use disorder 10/17/2016   Well adult exam 06/22/2016   Polycythemia, 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   Abscessed tooth 07/06/2015   Sebaceous cyst 07/06/2015   Common cold virus 12/23/2014   Infected sebaceous cyst 11/09/2014   Dyslipidemia 05/19/2013   URI, acute 09/15/2012   Strep throat 09/15/2012   Renal cell cancer (HCC) 09/02/2012   Adrenal mass (HCC) 06/03/2012   Nephrolithiasis 06/03/2012   Otitis media 01/08/2012   Dark urine 01/08/2012   Decreased vision in both eyes 01/08/2012   Onychomycosis 05/25/2011   DM type 2, controlled, with complication (HCC) 05/23/2010   Keloid scar 03/07/2010   SKIN RASH 03/07/2010   LACERATION, FINGER 09/15/2009   Osteoarthritis 08/15/2009   LOW BACK PAIN 08/15/2009   CELLULITIS AND ABSCESS OF BUTTOCK 03/29/2009   CERVICAL RADICULOPATHY 11/09/2008   WARTS, VIRAL, UNSPECIFIED 10/28/2007   Essential hypertension 06/30/2007   CLUSTER HEADACHE 06/27/2007   PSA, INCREASED 06/27/2007    ONSET DATE: June-July 2024, referral date 07/31/23  REFERRING DIAG: G54.5 (ICD-10-CM) - Neuralgic amyotrophy of right brachial plexus  THERAPY DIAG:  Hemiplegia affecting right nondominant side, unspecified etiology, unspecified hemiplegia type (HCC)  Other lack of coordination  Other disturbances of skin sensation  Muscle weakness (generalized)  Rationale for Evaluation and  Treatment: Rehabilitation  SUBJECTIVE:   SUBJECTIVE STATEMENT: Pt reports when he lets his hand dangle down, he feels that he has more function in his fingers.  Pt also reports able to complete increased finger abduction when completing one digit at a time.  Pt accompanied by: self  PERTINENT HISTORY: diabetes mellitus, hyperlipidemia, hypertension, bladder cancer, CAD, right renal cancer, and GERD presenting for evaluation of right hand weakness.   PRECAUTIONS: None  WEIGHT BEARING RESTRICTIONS: No  PAIN:  Are you having pain? No  FALLS: Has patient fallen in last 6 months? No, "had some close calls"  LIVING ENVIRONMENT: Lives with: lives with their family (spouse and 39 yo grandson) Lives in: House/apartment Stairs: Yes: External: ramp to back entrance, front entrance has 5-6 steps Has following equipment at home: Ramped entry, comfort height toilet  PLOF: Independent and Independent with basic ADLs  PATIENT GOALS: to be able to open/close my hand and use fingers  OBJECTIVE:  Note: Objective measures were completed at Evaluation unless otherwise noted.  HAND DOMINANCE: Ambidextrous, writes and eat with L hand, bats/throws  with R hand  ADLs: Overall ADLs: "I've just had to learn how to do things differently" Transfers/ambulation related to ADLs: Independent Eating: Mod I Grooming: Mod I UB Dressing: difficulty finding arm hole on long sleeve shirts LB Dressing: difficulty with getting zipper down on jeans Toileting: Independent Bathing: Independent Tub Shower transfers: Independent Equipment: none  IADLs: Light housekeeping: Mod I Meal Prep: Mod I,  "I've just had to learn how to do things differently" utilizing 2 hand chop when making BBQ Community mobility: driving Medication management: increased time  MOBILITY STATUS: Independent  POSTURE COMMENTS:  No Significant postural limitations  ACTIVITY TOLERANCE: Activity tolerance: WFL for tasks  assessed  FUNCTIONAL OUTCOME MEASURES: Quick Dash: 13.63 impairment The Brachial Assessment Tool: 77/93 Subscale 1: dressing items: 20/24 Subscale 2: arm and hand items: 42/51 Subscale 3: no hand items 15/18  UPPER EXTREMITY ROM:  decreased wrist extension against gravity, decresaed finger extension long, ring, and small finger  Active ROM Right eval Left eval  Shoulder flexion [facility]  Shoulder abduction [facility]  Shoulder adduction    Shoulder extension    Shoulder internal rotation [facility]  Shoulder external rotation [facility]  Elbow flexion [facility] WFL  Elbow extension [facility] WFL  Wrist flexion [facility] WFL  Wrist extension [facility] WFL  Wrist ulnar deviation    Wrist radial deviation    Wrist pronation WFL WFL  Wrist supination WFL WFL  (Blank rows = not tested)  UPPER EXTREMITY MMT:     MMT Right eval Left eval  Shoulder flexion 5/5 5/5  Shoulder abduction 5/5 5/5  Shoulder adduction    Shoulder extension    Shoulder internal rotation 5/5 5/5  Shoulder external rotation 5/5 5/5  Middle trapezius    Lower trapezius    Elbow flexion 5/5 5/5  Elbow extension 5/5 5/5  Wrist flexion 5/5 5/5  Wrist extension 4/5 5/5  Wrist ulnar deviation    Wrist radial deviation    Wrist pronation    Wrist supination    (Blank rows = not tested)  RUE: Finger extensors 1/5  Finger flexors 4/5   Dorsal interossei 4/5   Abductor pollicis 4/5    HAND FUNCTION: Grip strength: Right: 68 lbs; Left: 21 lbs  COORDINATION: Box and Blocks:  Right 38 blocks, Left 50 blocks  SENSATION: Reporting tingling  EDEMA: NA  COGNITION: Overall cognitive status: Within functional limits for tasks assessed  OBSERVATIONS: Pt demonstrating decreased wrist extension against gravity during box and blocks assessment.  Pt able to complete thumb opposition, however with flat fingers as opposed to rounded "O" shape.  Pt with difficulty with finger abduction and extension from table top.   TODAY'S  TREATMENT:                                                                                 DATE:  08/14/23 NMR exercises in supine for increased scapular and cervical support and gravity-minimized/eliminated positioning.  Engaged in snow angels (shoulder abduction), chest press, and shoulder flexion in supine position with dowel in BUE to increase symmetrical movements.  Therapist providing intermittent cues for symmetry.  OT noting mild decreased R wrist extension with hand placement  on dowel.  Ot increased challenge by adding 2# weighted dowel with good symmetry and effort noted. Wrist flexion/extension: engaged in wrist flexion/extension with 2# weighted dowel with pt demonstrating mild decreased R wrist extension compared to L.    08/12/23 Review exercises from previous session:  Pt demonstrating good technique with prayer stretch and AROM wrist flexion/extension.  Did note pt recruiting increased radial deviation during attempts at wrist extension.  Pt reports decreased flexion in fingers impacting ease of thumb opposition with "O" shape.   Wrist/digit ROM: engaged in ulnar deviation with forearm resting on table top and hand over edge of table to utilize gravity to assist in increased deviation and motor control.  OT encouraged pt to stabilize at forearm during finger abduction to facilitate increased ROM while decreasing compensatory wrist extension and radial deviation. Pt with decreased motor control of digits with abduction and unable to life against gravity. Ulnar nerve gliding: OT instructing pt in ulnar nerve glides at low level with focus on wrist extension and finger extension during wrist extension.  OT providing demonstration for technique and intermittent hand over hand to facilitate increased finger extension.  Educated on decreased sustained elbow flexion recommending changes in position to allow for increased movement of ulnar nerve to reduce pain, numbness, and tingling.       08/08/23  Engaged in wrist extension exercises with wrist flexion/extension against gravity and prayer stretch.  OT instructed on thumb opposition with focus on attempts at making "O" shape with each finger to thumb opposition.  Engaged in picking up and stacking cones with pt demonstrating difficulty with opening hand and ulnar deviation as needed to lift and place cup on stack.  PATIENT EDUCATION: Education details: ongoing condition specific education Person educated: Patient Education method: Explanation, Demonstration, Verbal cues, and Handouts Education comprehension: verbalized understanding and needs further education  HOME EXERCISE PROGRAM: Access Code: [email or web address] URL: [email or web address] Date: 08/12/2023 Prepared by: [facility] Outpatient  Re[facility] - [facility]  Exercises - Wrist Flexion Extension AROM with Fingers Curled and Palm Down  - 3 x daily - 5 reps - Thumb Opposition  - 3 x daily - 5 reps - Wrist Prayer Stretch  - 3 x daily - 5 reps - Ulnar Nerve/Median Glide- Low Level  - 3 x daily - 5 reps - Ulnar Nerve Mobilization - Low Level  - 3 x daily - 5 reps - Wrist Radial Ulnar Deviation AROM  - 3 x daily - 5 reps - Finger walking  - 3 x daily - 5 reps   GOALS: Goals reviewed with patient? Yes  SHORT TERM GOALS: Target date: 09/06/23  Pt will be independent in ROM and strengthening HEP. Baseline: Goal status: IN PROGRESS  2.  Pt will verbalize understanding of adaptive strategies, task modifications and/or potential AE needs to increase ease, safety, and independence w/ ADLs and IADLs. Baseline:  Goal status: IN PROGRESS  3.  Pt will demonstrate improved UE functional use for ADLs as evidenced by increasing box/ blocks score by 4 blocks with RUE Baseline: Right 38 blocks, Left 50 blocks Goal status: IN PROGRESS   LONG TERM GOALS: Target date: 10/04/23  Pt will demonstrate ability to pick up small objects from table top (pen, coin,  etc) x5 with RUE mild increased time and no drops. Baseline: very hard to pick up small items Goal status: IN PROGRESS  2.  Pt will verbalize and/or demonstrate improved ability to type/text on phone without increased pain >2/10 on pain  scale. Baseline:  Goal status: IN PROGRESS  3.  Pt will demonstrate ability to utilize BUE together without any pain or difficulty to engage in simple meal prep tasks (buttering bread, using knife/fork to cut foods, scooping foods, etc).  Baseline:  Goal status: v  4.  Pt will demonstrate improved grip strength by 10# to allow for increased ease with use of tools and making the bed. Baseline: L: 68#, R: 21# Goal status: IN PROGRESS  5.  Pt will report improved functional use of RUE during ADLs/IADLs as evidenced by improved score on BrAT by 5 points. Baseline: 77/93 Goal status: IN PROGRESS  ASSESSMENT:  CLINICAL IMPRESSION: Pt asking questions about cervical braces and cervical traction.  OT answering questions with her scope and encouraging pt to f/u with neurologist and/or PCP for further specific questions.  OT educated pt on completing exercises in supine to allow for improved cervical stability and to facilitate symmetry with BUE movements.    PERFORMANCE DEFICITS: in functional skills including ADLs, IADLs, coordination, dexterity, sensation, ROM, strength, pain, Fine motor control, Gross motor control, decreased knowledge of precautions, decreased knowledge of use of DME, and UE functional use and psychosocial skills including coping strategies and environmental adaptation.    PLAN:  OT FREQUENCY: 2x/week (2x/week for 4 weeks and then decrease to 1x/week)  OT DURATION: 8 weeks  PLANNED INTERVENTIONS: 97168 OT Re-evaluation, 97535 self care/ADL training, 06301 therapeutic exercise, 97530 therapeutic activity, 97112 neuromuscular re-education, 97140 manual therapy, 97035 ultrasound, 97010 moist heat, 97010 cryotherapy, 97032 electrical  stimulation (manual), passive range of motion, coping strategies training, patient/family education, and DME and/or AE instructions  RECOMMENDED OTHER SERVICES: NA  CONSULTED AND AGREED WITH PLAN OF CARE: Patient  PLAN FOR NEXT SESSION: Review nerve glides and further address finger ROM, grasp/release as able.   Rosalio Loud, OTR/L 08/14/2023, 11:14 AM   [facility] at [facility] [address] [place], [address] [place], [place] [address] Phone # [phone] Fax # [phone]

## 2023-08-14 NOTE — Telephone Encounter (Signed)
See previous encounter. Patient is scheduled 11/8

## 2023-08-19 ENCOUNTER — Ambulatory Visit: Payer: Medicare Other | Attending: Neurology | Admitting: Occupational Therapy

## 2023-08-19 DIAGNOSIS — G8193 Hemiplegia, unspecified affecting right nondominant side: Secondary | ICD-10-CM | POA: Diagnosis not present

## 2023-08-19 DIAGNOSIS — R208 Other disturbances of skin sensation: Secondary | ICD-10-CM | POA: Insufficient documentation

## 2023-08-19 DIAGNOSIS — R278 Other lack of coordination: Secondary | ICD-10-CM | POA: Diagnosis not present

## 2023-08-19 DIAGNOSIS — M6281 Muscle weakness (generalized): Secondary | ICD-10-CM | POA: Insufficient documentation

## 2023-08-19 NOTE — Therapy (Signed)
OUTPATIENT OCCUPATIONAL THERAPY NEURO  Treatment Note  Patient Name: [name] [name] MRN: [id number] DOB:Jan 16, 1956, 67 y.o., male Today's Date: 08/19/2023  PCP: Tresa Garter, MD REFERRING PROVIDER: Glendale Chard, DO  END OF SESSION:  OT End of Session - 08/19/23 1108     Visit Number 4    Number of Visits 13    Date for OT Re-Evaluation 10/04/23    Authorization Type [employer] Medicare    OT Start Time 1105    OT Stop Time 1145    OT Time Calculation (min) 40 min                Past Medical History:  Diagnosis Date   Benign localized prostatic hyperplasia with lower urinary tract symptoms (LUTS)    urologist--- dr Annabell Howells   Bladder cancer [facility])    urologist---- dr Annabell Howells---  s/p TURBT 11/2020; 03/ 2022   Coronary artery disease    mild   DDD (degenerative disc disease), cervical    From 1980s   Diabetes mellitus type II    followed by pcp--   (01-05-2021  per pt checks blood sugar three times per week,  fasting sugar --- 130--140_   Difficult intubation 07/28/2012   partial nephrectomy done @[facility]  07-28-2012  found to have anterior larynx , diffcult intubation, referred to anesthesia record in epic    Dyslipidemia    ED (erectile dysfunction)    Elevated PSA    GERD (gastroesophageal reflux disease)    History of adenomatous polyp of colon    History of kidney stones    History of prostatitis 2008   History of renal carcinoma urologist--- dr Annabell Howells   07-28-2012 for renal neoplasm  s/p right partal nephrectomy    Hypertension    followed by pcp   Osteoarthritis    Back   Renal carcinoma, right [facility])    Past Surgical History:  Procedure Laterality Date   CATARACT EXTRACTION     COLONOSCOPY N/A 03/03/2013   Procedure: COLONOSCOPY;  Surgeon: Hilarie Fredrickson, MD;  Location: [facility];  Service: Endoscopy;  Laterality: N/A;   COLONOSCOPY WITH PROPOFOL N/A 09/29/2019   Procedure: COLONOSCOPY WITH PROPOFOL;  Surgeon: Hilarie Fredrickson, MD;  Location: [facility];  Service: Endoscopy;  Laterality: N/A;   CYSTOSCOPY WITH BIOPSY N/A 12/09/2020   Procedure: CYSTOSCOPY WITH TURBT, BIOPSY AND FULGURATION;  Surgeon: Bjorn Pippin, MD;  Location: [facility];  Service: Urology;  Laterality: N/A;  REQUESTING 45 MINS   EXTRACORPOREAL SHOCK WAVE LITHOTRIPSY  01-10-2015;  03-03-2012  @[facility]    POLYPECTOMY  09/29/2019   Procedure: POLYPECTOMY;  Surgeon: Hilarie Fredrickson, MD;  Location: [facility];  Service: Endoscopy;;   ROBOT ASSISTED LAPAROSCOPIC PARTIAL NEPHRECTOMY  07-28-2012   DR Laverle Patter  @[facility]    TONSILLECTOMY  1965   TRANSURETHRAL RESECTION OF BLADDER TUMOR WITH MITOMYCIN-C N/A 01/06/2021   Procedure: TRANSURETHRAL RESECTION OF BLADDER TUMOR WITH GEMCITIBINE IN PACU;  Surgeon: Bjorn Pippin, MD;  Location[facility];  Service: Urology;  Laterality: N/A;   TRANSURETHRAL RESECTION OF BLADDER TUMOR WITH MITOMYCIN-C N/A 09/01/2021   Procedure: TRANSURETHRAL RESECTION OF BLADDER TUMOR WITH POST OPERATIVE INSTILLATION OF GEMCITABINE;  Surgeon: Bjorn Pippin, MD;  Location: [facility];  Service: Urology;  Laterality: N/A;   TRANSURETHRAL RESECTION OF BLADDER TUMOR WITH MITOMYCIN-C Bilateral 11/13/2022   Procedure: CYSTOSCOPY TRANSURETHRAL RESECTION OF BLADDER TUMOR BILATERAL RETROGRADES WITH INSTILL GEMCTABINE;  Surgeon: Bjorn Pippin, MD;  Location: [facility];  Service: Urology;  Laterality: Bilateral;  1 HR FOR CASE   Patient Active Problem List   Diagnosis Date Noted   Cervical radiculitis 04/30/2023   Diabetic neuropathy (HCC) 04/23/2023   Intractable hiccups 02/05/2023   Upper abdominal pain 02/05/2023   Gastroesophageal reflux disease 02/05/2023   EKG abnormalities 02/05/2023   Scalp itch 01/23/2023   Tetrahydrocannabinol (THC) use disorder, moderate, dependence (HCC) 01/23/2023   Polyneuropathy due to type 2 diabetes mellitus (HCC) 10/23/2022   Bladder cancer (HCC) 10/23/2022   Memory problem 07/17/2022   Leg pain, anterior, right  12/26/2021   Gross hematuria 09/16/2021   Statin declined 08/06/2021   Acute abscess of face 08/03/2021   Atherosclerosis of aorta (HCC) 04/26/2021   Coronary atherosclerosis 01/08/2021   Ascending aortic aneurysm (HCC) 01/08/2021   Bladder neck obstruction 10/20/2019   History of colonic polyps    Benign neoplasm of ascending colon    Benign neoplasm of transverse colon    Viral illness 06/16/2019   Exposure to COVID-19 virus 02/02/2019   Cerumen impaction 08/27/2018   Pilonidal cyst 01/18/2017   Neoplasm of uncertain behavior of skin 01/18/2017   Hypokalemia 12/11/2016   Tobacco use disorder 10/17/2016   Well adult exam 06/22/2016   Polycythemia, 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   Abscessed tooth 07/06/2015   Sebaceous cyst 07/06/2015   Common cold virus 12/23/2014   Infected sebaceous cyst 11/09/2014   Dyslipidemia 05/19/2013   URI, acute 09/15/2012   Strep throat 09/15/2012   Renal cell cancer (HCC) 09/02/2012   Adrenal mass (HCC) 06/03/2012   Nephrolithiasis 06/03/2012   Otitis media 01/08/2012   Dark urine 01/08/2012   Decreased vision in both eyes 01/08/2012   Onychomycosis 05/25/2011   DM type 2, controlled, with complication (HCC) 05/23/2010   Keloid scar 03/07/2010   SKIN RASH 03/07/2010   LACERATION, FINGER 09/15/2009   Osteoarthritis 08/15/2009   LOW BACK PAIN 08/15/2009   CELLULITIS AND ABSCESS OF BUTTOCK 03/29/2009   CERVICAL RADICULOPATHY 11/09/2008   WARTS, VIRAL, UNSPECIFIED 10/28/2007   Essential hypertension 06/30/2007   CLUSTER HEADACHE 06/27/2007   PSA, INCREASED 06/27/2007    ONSET DATE: June-July 2024, referral date 07/31/23  REFERRING DIAG: G54.5 (ICD-10-CM) - Neuralgic amyotrophy of right brachial plexus  THERAPY DIAG:  Hemiplegia affecting right nondominant side, unspecified etiology, unspecified hemiplegia type (HCC)  Other lack of coordination  Other disturbances of skin sensation  Muscle weakness (generalized)  Rationale for Evaluation and  Treatment: Rehabilitation  SUBJECTIVE:   SUBJECTIVE STATEMENT: Pt reports when he lets his hand dangle down, he feels that he has more function in his fingers.  Pt also reports able to complete increased finger abduction when completing one digit at a time.  Pt accompanied by: self  PERTINENT HISTORY: diabetes mellitus, hyperlipidemia, hypertension, bladder cancer, CAD, right renal cancer, and GERD presenting for evaluation of right hand weakness.   PRECAUTIONS: None  WEIGHT BEARING RESTRICTIONS: No  PAIN:  Are you having pain? No  FALLS: Has patient fallen in last 6 months? No, "had some close calls"  LIVING ENVIRONMENT: Lives with: lives with their family (spouse and 85 yo grandson) Lives in: House/apartment Stairs: Yes: External: ramp to back entrance, front entrance has 5-6 steps Has following equipment at home: Ramped entry, comfort height toilet  PLOF: Independent and Independent with basic ADLs  PATIENT GOALS: to be able to open/close my hand and use fingers  OBJECTIVE:  Note: Objective measures were completed at Evaluation unless otherwise noted.  HAND DOMINANCE: Ambidextrous, writes and eat with L hand,  bats/throws with R hand  ADLs: Overall ADLs: "I've just had to learn how to do things differently" Transfers/ambulation related to ADLs: Independent Eating: Mod I Grooming: Mod I UB Dressing: difficulty finding arm hole on long sleeve shirts LB Dressing: difficulty with getting zipper down on jeans Toileting: Independent Bathing: Independent Tub Shower transfers: Independent Equipment: none  IADLs: Light housekeeping: Mod I Meal Prep: Mod I,  "I've just had to learn how to do things differently" utilizing 2 hand chop when making BBQ Community mobility: driving Medication management: increased time  MOBILITY STATUS: Independent  POSTURE COMMENTS:  No Significant postural limitations  ACTIVITY TOLERANCE: Activity tolerance: WFL for tasks  assessed  FUNCTIONAL OUTCOME MEASURES: Quick Dash: 13.63 impairment The Brachial Assessment Tool: 77/93 Subscale 1: dressing items: 20/24 Subscale 2: arm and hand items: 42/51 Subscale 3: no hand items 15/18  UPPER EXTREMITY ROM:  decreased wrist extension against gravity, decresaed finger extension long, ring, and small finger  Active ROM Right eval Left eval  Shoulder flexion [facility]  Shoulder abduction [facility]  Shoulder adduction    Shoulder extension    Shoulder internal rotation [facility]  Shoulder external rotation [facility]  Elbow flexion [facility] WFL  Elbow extension [facility] WFL  Wrist flexion [facility] WFL  Wrist extension [facility] WFL  Wrist ulnar deviation    Wrist radial deviation    Wrist pronation WFL WFL  Wrist supination WFL WFL  (Blank rows = not tested)  UPPER EXTREMITY MMT:     MMT Right eval Left eval  Shoulder flexion 5/5 5/5  Shoulder abduction 5/5 5/5  Shoulder adduction    Shoulder extension    Shoulder internal rotation 5/5 5/5  Shoulder external rotation 5/5 5/5  Middle trapezius    Lower trapezius    Elbow flexion 5/5 5/5  Elbow extension 5/5 5/5  Wrist flexion 5/5 5/5  Wrist extension 4/5 5/5  Wrist ulnar deviation    Wrist radial deviation    Wrist pronation    Wrist supination    (Blank rows = not tested)  RUE: Finger extensors 1/5  Finger flexors 4/5   Dorsal interossei 4/5   Abductor pollicis 4/5    HAND FUNCTION: Grip strength: Right: 68 lbs; Left: 21 lbs  COORDINATION: Box and Blocks:  Right 38 blocks, Left 50 blocks  SENSATION: Reporting tingling  EDEMA: NA  COGNITION: Overall cognitive status: Within functional limits for tasks assessed  OBSERVATIONS: Pt demonstrating decreased wrist extension against gravity during box and blocks assessment.  Pt able to complete thumb opposition, however with flat fingers as opposed to rounded "O" shape.  Pt with difficulty with finger abduction and extension from table top.   TODAY'S  TREATMENT:                                                                                 DATE:  08/19/23 Wrist ROM: engaged in wrist extension/flexion on med ball with forced extension and motor control.  Pt recruiting ulnar deviation when pushing ball back forward into wrist flexion.  Transitioned to supination/pronation and ulnar/radial deviation when rolling ball side to side.  Utilized flex bar to engage in wrist flexion/extension on red flex  bar, however pt with difficulty with maintaining grasp with R hand against resistance.  Therefore pt sliding hand over bar to facilitate increased extension.   Large foam blocks: engaged in unstacking blocks with R hand while maintaining elbow on table top to facilitate increased wrist extension.  Pt completed x3 with initial difficulty opening hand to get around block.  OT providing min cues and demonstration for increased wrist extension.   Finger ROM: engaged in finger abduction and attempts at finger extension, however pt still requiring wrist elevation and ulnar deviation to attempt to spread fingers and with no active finger extension from table top.  Pt however with improved ability to open hand when picking up large foam blocks.   Ulnar nerve glides:  OT providing demonstration and mod tactile cues to coordinate bending elbow while extending wrist, then straightening elbow and flexing wrist.  Pt with difficulty coordinating both movements without tactile cues.  Pt provided with handout to ensure carryover.  Ulnar nerve/median glide with pt able to extend wrist, however when encouraged to attempt to extend wrist and fingers pt with decreased wrist extension. OT placed foam block into R hand and encouraged pt to complete wrist extension and then open fingers to drop block.  Pt demonstrating tenodesis grasp with only able to release block when returning to wrist flexion. OT educated on use of tenodesis grasp/positioning to aid in release of  objects.    08/14/23 NMR exercises in supine for increased scapular and cervical support and gravity-minimized/eliminated positioning.  Engaged in snow angels (shoulder abduction), chest press, and shoulder flexion in supine position with dowel in BUE to increase symmetrical movements.  Therapist providing intermittent cues for symmetry.  OT noting mild decreased R wrist extension with hand placement on dowel.  Ot increased challenge by adding 2# weighted dowel with good symmetry and effort noted. Wrist flexion/extension: engaged in wrist flexion/extension with 2# weighted dowel with pt demonstrating mild decreased R wrist extension compared to L.    08/12/23 Review exercises from previous session:  Pt demonstrating good technique with prayer stretch and AROM wrist flexion/extension.  Did note pt recruiting increased radial deviation during attempts at wrist extension.  Pt reports decreased flexion in fingers impacting ease of thumb opposition with "O" shape.   Wrist/digit ROM: engaged in ulnar deviation with forearm resting on table top and hand over edge of table to utilize gravity to assist in increased deviation and motor control.  OT encouraged pt to stabilize at forearm during finger abduction to facilitate increased ROM while decreasing compensatory wrist extension and radial deviation. Pt with decreased motor control of digits with abduction and unable to life against gravity. Ulnar nerve gliding: OT instructing pt in ulnar nerve glides at low level with focus on wrist extension and finger extension during wrist extension.  OT providing demonstration for technique and intermittent hand over hand to facilitate increased finger extension.  Educated on decreased sustained elbow flexion recommending changes in position to allow for increased movement of ulnar nerve to reduce pain, numbness, and tingling.     PATIENT EDUCATION: Education details: ongoing condition specific education Person  educated: Patient Education method: Explanation, Demonstration, Verbal cues, and Handouts Education comprehension: verbalized understanding and needs further education  HOME EXERCISE PROGRAM: Access Code: [email or web address] URL: [email or web address] Date: 08/12/2023 Prepared by: [facility] - Outpatient  Rehab - [facility]  Exercises - Wrist Flexion Extension AROM with Fingers Curled and Palm Down  - 3 x daily - 5 reps - Thumb Opposition  -  3 x daily - 5 reps - Wrist Prayer Stretch  - 3 x daily - 5 reps - Ulnar Nerve/Median Glide- Low Level  - 3 x daily - 5 reps - Ulnar Nerve Mobilization - Low Level  - 3 x daily - 5 reps - Wrist Radial Ulnar Deviation AROM  - 3 x daily - 5 reps - Finger walking  - 3 x daily - 5 reps   GOALS: Goals reviewed with patient? Yes  SHORT TERM GOALS: Target date: 09/06/23  Pt will be independent in ROM and strengthening HEP. Baseline: Goal status: IN PROGRESS  2.  Pt will verbalize understanding of adaptive strategies, task modifications and/or potential AE needs to increase ease, safety, and independence w/ ADLs and IADLs. Baseline:  Goal status: IN PROGRESS  3.  Pt will demonstrate improved UE functional use for ADLs as evidenced by increasing box/ blocks score by 4 blocks with RUE Baseline: Right 38 blocks, Left 50 blocks Goal status: IN PROGRESS   LONG TERM GOALS: Target date: 10/04/23  Pt will demonstrate ability to pick up small objects from table top (pen, coin, etc) x5 with RUE mild increased time and no drops. Baseline: very hard to pick up small items Goal status: IN PROGRESS  2.  Pt will verbalize and/or demonstrate improved ability to type/text on phone without increased pain >2/10 on pain scale. Baseline:  Goal status: IN PROGRESS  3.  Pt will demonstrate ability to utilize BUE together without any pain or difficulty to engage in simple meal prep tasks (buttering bread, using knife/fork to cut foods, scooping foods,  etc).  Baseline:  Goal status: v  4.  Pt will demonstrate improved grip strength by 10# to allow for increased ease with use of tools and making the bed. Baseline: L: 68#, R: 21# Goal status: IN PROGRESS  5.  Pt will report improved functional use of RUE during ADLs/IADLs as evidenced by improved score on BrAT by 5 points. Baseline: 77/93 Goal status: IN PROGRESS  ASSESSMENT:  CLINICAL IMPRESSION: Pt demonstrating improved hand function when down by side vs on table top, as well as demonstrating increased finger extension with wrist flexed (tenodesis grasp).  OT educated on awareness of positioning to increase functional use of RUE.  Pt demonstrating decreased sustain grasp against resistance, but able to sustain grasp on blocks when stacking/unstacking and when attempting to drop into basket.  PERFORMANCE DEFICITS: in functional skills including ADLs, IADLs, coordination, dexterity, sensation, ROM, strength, pain, Fine motor control, Gross motor control, decreased knowledge of precautions, decreased knowledge of use of DME, and UE functional use and psychosocial skills including coping strategies and environmental adaptation.    PLAN:  OT FREQUENCY: 2x/week (2x/week for 4 weeks and then decrease to 1x/week)  OT DURATION: 8 weeks  PLANNED INTERVENTIONS: 97168 OT Re-evaluation, 97535 self care/ADL training, 57322 therapeutic exercise, 97530 therapeutic activity, 97112 neuromuscular re-education, 97140 manual therapy, 97035 ultrasound, 97010 moist heat, 97010 cryotherapy, 97032 electrical stimulation (manual), passive range of motion, coping strategies training, patient/family education, and DME and/or AE instructions  RECOMMENDED OTHER SERVICES: NA  CONSULTED AND AGREED WITH PLAN OF CARE: Patient  PLAN FOR NEXT SESSION: Review nerve glides and further address finger ROM, grasp/release as able.   Rosalio Loud, OTR/L 08/19/2023, 11:09 AM   [facility] at [facility] [address] [place], [address] [place], [place] [address] Phone # [phone] Fax # [phone]

## 2023-08-20 ENCOUNTER — Ambulatory Visit: Payer: Medicare Other | Admitting: Neurology

## 2023-08-21 ENCOUNTER — Ambulatory Visit: Payer: Medicare Other | Admitting: Occupational Therapy

## 2023-08-21 ENCOUNTER — Encounter: Payer: Self-pay | Admitting: Internal Medicine

## 2023-08-21 DIAGNOSIS — G8193 Hemiplegia, unspecified affecting right nondominant side: Secondary | ICD-10-CM

## 2023-08-21 DIAGNOSIS — R208 Other disturbances of skin sensation: Secondary | ICD-10-CM

## 2023-08-21 DIAGNOSIS — R278 Other lack of coordination: Secondary | ICD-10-CM

## 2023-08-21 DIAGNOSIS — M6281 Muscle weakness (generalized): Secondary | ICD-10-CM | POA: Diagnosis not present

## 2023-08-21 NOTE — Therapy (Signed)
OUTPATIENT OCCUPATIONAL THERAPY NEURO  Treatment Note  Patient Name: [name] [name] MRN: [id number] DOB:1956-08-31, 67 y.o., male Today's Date: 08/21/2023  PCP: Tresa Garter, MD REFERRING PROVIDER: Glendale Chard, DO  END OF SESSION:  OT End of Session - 08/21/23 1201     Visit Number 5    Number of Visits 13    Date for OT Re-Evaluation 10/04/23    Authorization Type [employer] Medicare    OT Start Time 1020    OT Stop Time 1100    OT Time Calculation (min) 40 min    Activity Tolerance Patient tolerated treatment well    Behavior During Therapy WFL for tasks assessed/performed                 Past Medical History:  Diagnosis Date   Benign localized prostatic hyperplasia with lower urinary tract symptoms (LUTS)    urologist--- dr Annabell Howells   Bladder cancer [facility])    urologist---- dr Annabell Howells---  s/p TURBT 11/2020; 03/ 2022   Coronary artery disease    mild   DDD (degenerative disc disease), cervical    From 1980s   Diabetes mellitus type II    followed by pcp--   (01-05-2021  per pt checks blood sugar three times per week,  fasting sugar --- 130--140_   Difficult intubation 07/28/2012   partial nephrectomy done @[facility]  07-28-2012  found to have anterior larynx , diffcult intubation, referred to anesthesia record in epic    Dyslipidemia    ED (erectile dysfunction)    Elevated PSA    GERD (gastroesophageal reflux disease)    History of adenomatous polyp of colon    History of kidney stones    History of prostatitis 2008   History of renal carcinoma urologist--- dr Annabell Howells   07-28-2012 for renal neoplasm  s/p right partal nephrectomy    Hypertension    followed by pcp   Osteoarthritis    Back   Renal carcinoma, right [facility])    Past Surgical History:  Procedure Laterality Date   CATARACT EXTRACTION     COLONOSCOPY N/A 03/03/2013   Procedure: COLONOSCOPY;  Surgeon: Hilarie Fredrickson, MD;  Location: [facility];  Service: Endoscopy;  Laterality: N/A;   COLONOSCOPY  WITH PROPOFOL N/A 09/29/2019   Procedure: COLONOSCOPY WITH PROPOFOL;  Surgeon: Hilarie Fredrickson, MD;  Location: [facility];  Service: Endoscopy;  Laterality: N/A;   CYSTOSCOPY WITH BIOPSY N/A 12/09/2020   Procedure: CYSTOSCOPY WITH TURBT, BIOPSY AND FULGURATION;  Surgeon: Bjorn Pippin, MD;  Location: [facility];  Service: Urology;  Laterality: N/A;  REQUESTING 45 MINS   EXTRACORPOREAL SHOCK WAVE LITHOTRIPSY  01-10-2015;  03-03-2012  @[facility]    POLYPECTOMY  09/29/2019   Procedure: POLYPECTOMY;  Surgeon: Hilarie Fredrickson, MD;  Location: [facility];  Service: Endoscopy;;   ROBOT ASSISTED LAPAROSCOPIC PARTIAL NEPHRECTOMY  07-28-2012   DR Laverle Patter  @[facility]    TONSILLECTOMY  1965   TRANSURETHRAL RESECTION OF BLADDER TUMOR WITH MITOMYCIN-C N/A 01/06/2021   Procedure: TRANSURETHRAL RESECTION OF BLADDER TUMOR WITH GEMCITIBINE IN PACU;  Surgeon: Bjorn Pippin, MD;  Location: [facility];  Service: Urology;  Laterality: N/A;   TRANSURETHRAL RESECTION OF BLADDER TUMOR WITH MITOMYCIN-C N/A 09/01/2021   Procedure: TRANSURETHRAL RESECTION OF BLADDER TUMOR WITH POST OPERATIVE INSTILLATION OF GEMCITABINE;  Surgeon: Bjorn Pippin, MD;  Location: [facility];  Service: Urology;  Laterality: N/A;   TRANSURETHRAL RESECTION OF BLADDER TUMOR WITH MITOMYCIN-C Bilateral 11/13/2022   Procedure: CYSTOSCOPY TRANSURETHRAL RESECTION OF  BLADDER TUMOR BILATERAL RETROGRADES WITH INSTILL GEMCTABINE;  Surgeon: Bjorn Pippin, MD;  Location: [facility];  Service: Urology;  Laterality: Bilateral;  1 HR FOR CASE   Patient Active Problem List   Diagnosis Date Noted   Cervical radiculitis 04/30/2023   Diabetic neuropathy (HCC) 04/23/2023   Intractable hiccups 02/05/2023   Upper abdominal pain 02/05/2023   Gastroesophageal reflux disease 02/05/2023   EKG abnormalities 02/05/2023   Scalp itch 01/23/2023   Tetrahydrocannabinol (THC) use disorder, moderate, dependence (HCC) 01/23/2023   Polyneuropathy due to type 2 diabetes mellitus  (HCC) 10/23/2022   Bladder cancer (HCC) 10/23/2022   Memory problem 07/17/2022   Leg pain, anterior, right 12/26/2021   Gross hematuria 09/16/2021   Statin declined 08/06/2021   Acute abscess of face 08/03/2021   Atherosclerosis of aorta (HCC) 04/26/2021   Coronary atherosclerosis 01/08/2021   Ascending aortic aneurysm (HCC) 01/08/2021   Bladder neck obstruction 10/20/2019   History of colonic polyps    Benign neoplasm of ascending colon    Benign neoplasm of transverse colon    Viral illness 06/16/2019   Exposure to COVID-19 virus 02/02/2019   Cerumen impaction 08/27/2018   Pilonidal cyst 01/18/2017   Neoplasm of uncertain behavior of skin 01/18/2017   Hypokalemia 12/11/2016   Tobacco use disorder 10/17/2016   Well adult exam 06/22/2016   Polycythemia, 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   Abscessed tooth 07/06/2015   Sebaceous cyst 07/06/2015   Common cold virus 12/23/2014   Infected sebaceous cyst 11/09/2014   Dyslipidemia 05/19/2013   URI, acute 09/15/2012   Strep throat 09/15/2012   Renal cell cancer (HCC) 09/02/2012   Adrenal mass (HCC) 06/03/2012   Nephrolithiasis 06/03/2012   Otitis media 01/08/2012   Dark urine 01/08/2012   Decreased vision in both eyes 01/08/2012   Onychomycosis 05/25/2011   DM type 2, controlled, with complication (HCC) 05/23/2010   Keloid scar 03/07/2010   SKIN RASH 03/07/2010   LACERATION, FINGER 09/15/2009   Osteoarthritis 08/15/2009   LOW BACK PAIN 08/15/2009   CELLULITIS AND ABSCESS OF BUTTOCK 03/29/2009   CERVICAL RADICULOPATHY 11/09/2008   WARTS, VIRAL, UNSPECIFIED 10/28/2007   Essential hypertension 06/30/2007   CLUSTER HEADACHE 06/27/2007   PSA, INCREASED 06/27/2007    ONSET DATE: June-July 2024, referral date 07/31/23  REFERRING DIAG: G54.5 (ICD-10-CM) - Neuralgic amyotrophy of right brachial plexus  THERAPY DIAG:  Hemiplegia affecting right nondominant side, unspecified etiology, unspecified hemiplegia type (HCC)  Other lack of  coordination  Other disturbances of skin sensation  Muscle weakness (generalized)  Rationale for Evaluation and Treatment: Rehabilitation  SUBJECTIVE:   SUBJECTIVE STATEMENT: Pt reports HEP is going okay, completing exercises "every now and then, not like I should." Pt expressed concern about upcoming MRI on R shoulder because "they don't know what's going on exactly." Pt reported difficulty with dexterity of R hand to complete daily tasks. Pt reported "I can pick things up but I can't get my fingers to work right."   Pt accompanied by: self  PERTINENT HISTORY: diabetes mellitus, hyperlipidemia, hypertension, bladder cancer, CAD, right renal cancer, and GERD presenting for evaluation of right hand weakness.   PRECAUTIONS: None  WEIGHT BEARING RESTRICTIONS: No  PAIN:  Are you having pain? No  FALLS: Has patient fallen in last 6 months? No, "had some close calls"  LIVING ENVIRONMENT: Lives with: lives with their family (spouse and 10 yo grandson) Lives in: House/apartment Stairs: Yes: External: ramp to back entrance, front entrance has 5-6 steps Has following equipment at home: Ramped entry, comfort  height toilet  PLOF: Independent and Independent with basic ADLs  PATIENT GOALS: to be able to open/close my hand and use fingers  OBJECTIVE:  Note: Objective measures were completed at Evaluation unless otherwise noted.  HAND DOMINANCE: Ambidextrous, writes and eat with L hand, bats/throws with R hand  ADLs: Overall ADLs: "I've just had to learn how to do things differently" Transfers/ambulation related to ADLs: Independent Eating: Mod I Grooming: Mod I UB Dressing: difficulty finding arm hole on long sleeve shirts LB Dressing: difficulty with getting zipper down on jeans Toileting: Independent Bathing: Independent Tub Shower transfers: Independent Equipment: none  IADLs: Light housekeeping: Mod I Meal Prep: Mod I,  "I've just had to learn how to do things  differently" utilizing 2 hand chop when making BBQ Community mobility: driving Medication management: increased time  MOBILITY STATUS: Independent  POSTURE COMMENTS:  No Significant postural limitations  ACTIVITY TOLERANCE: Activity tolerance: WFL for tasks assessed  FUNCTIONAL OUTCOME MEASURES: Quick Dash: 13.63 impairment The Brachial Assessment Tool: 77/93 Subscale 1: dressing items: 20/24 Subscale 2: arm and hand items: 42/51 Subscale 3: no hand items 15/18  UPPER EXTREMITY ROM:  decreased wrist extension against gravity, decresaed finger extension long, ring, and small finger  Active ROM Right eval Left eval  Shoulder flexion [facility]  Shoulder abduction [facility]  Shoulder adduction    Shoulder extension    Shoulder internal rotation [facility]  Shoulder external rotation [facility]  Elbow flexion [facility] WFL  Elbow extension [facility] WFL  Wrist flexion [facility] WFL  Wrist extension [facility] WFL  Wrist ulnar deviation    Wrist radial deviation    Wrist pronation WFL WFL  Wrist supination WFL WFL  (Blank rows = not tested)  UPPER EXTREMITY MMT:     MMT Right eval Left eval  Shoulder flexion 5/5 5/5  Shoulder abduction 5/5 5/5  Shoulder adduction    Shoulder extension    Shoulder internal rotation 5/5 5/5  Shoulder external rotation 5/5 5/5  Middle trapezius    Lower trapezius    Elbow flexion 5/5 5/5  Elbow extension 5/5 5/5  Wrist flexion 5/5 5/5  Wrist extension 4/5 5/5  Wrist ulnar deviation    Wrist radial deviation    Wrist pronation    Wrist supination    (Blank rows = not tested)  RUE: Finger extensors 1/5  Finger flexors 4/5   Dorsal interossei 4/5   Abductor pollicis 4/5    HAND FUNCTION: Grip strength: Right: 68 lbs; Left: 21 lbs  COORDINATION: Box and Blocks:  Right 38 blocks, Left 50 blocks  SENSATION: Reporting tingling  EDEMA: NA  COGNITION: Overall cognitive status: Within functional limits for tasks assessed  OBSERVATIONS: Pt  demonstrating decreased wrist extension against gravity during box and blocks assessment.  Pt able to complete thumb opposition, however with flat fingers as opposed to rounded "O" shape.  Pt with difficulty with finger abduction and extension from table top.   TODAY'S TREATMENT:                                                                                 DATE:  08/21/23 Neuro Re-Ed - Pt reported not consistently  completing HEP and pt demo'd difficulty with HEP when asked to demonstrate exercises. Therefore, OT reviewed RUE ROM HEP to promote pt's understanding of RUE proprioception and positioning, to decrease pain, to improve coordination of RUE. - Pt greatly benefited from therapist modeling by mirroring movements, v/c and tactile cues for positioning of RUE, additional handwritten notes added to handout for RUE positioning and LUE support, and increased time to understand motor movements for HEP. V/c to decrease speed of movements to focus on quality of movement. Pt demo'd understanding with increased time, v/c, and modeling. Continuing education recommended.  Therapeutic Activities -  OT initiated coordination HEP - to improve RUE FM coordination, dexterity, RUE AROM. - Handout provided, see pt instructions. - Pt participated in picking up small magnets and beans and placing through slot, shuffling cards, flipping cards over 1 at a time, stacking magnets, rolling paper, rolling pen in fingertips, and finger-to-palm translation of small magnets. Pt demo'd understanding of each with some drops and increased difficulty noted for smaller items. OT educated pt that smaller items would increase difficulty and larger items would decrease difficulty. Pt verbalized understanding.  OT educated pt on importance of consistent completion of HEP to improve outcomes and to incorporate HEP in daily routine. Pt verbalized understanding.   PATIENT EDUCATION: Education details: OT role, UE nerve anatomy, also see  today's treatment above Person educated: Patient Education method: Explanation, Demonstration, Verbal cues, and Handouts Education comprehension: verbalized understanding and needs further education  HOME EXERCISE PROGRAM: Access Code: [email or web address] URL: [email or web address] Date: 08/12/2023 Prepared by: [facility] - Outpatient  Rehab - [facility]  Exercises - Wrist Flexion Extension AROM with Fingers Curled and Palm Down  - 3 x daily - 5 reps - Thumb Opposition  - 3 x daily - 5 reps - Wrist Prayer Stretch  - 3 x daily - 5 reps - Ulnar Nerve/Median Glide- Low Level  - 3 x daily - 5 reps - Ulnar Nerve Mobilization - Low Level  - 3 x daily - 5 reps - Wrist Radial Ulnar Deviation AROM  - 3 x daily - 5 reps - Finger walking  - 3 x daily - 5 reps   GOALS: Goals reviewed with patient? Yes  SHORT TERM GOALS: Target date: 09/06/23  Pt will be independent in ROM and strengthening HEP. Baseline: Goal status: IN PROGRESS  2.  Pt will verbalize understanding of adaptive strategies, task modifications and/or potential AE needs to increase ease, safety, and independence w/ ADLs and IADLs. Baseline:  Goal status: IN PROGRESS  3.  Pt will demonstrate improved UE functional use for ADLs as evidenced by increasing box/ blocks score by 4 blocks with RUE Baseline: Right 38 blocks, Left 50 blocks Goal status: IN PROGRESS   LONG TERM GOALS: Target date: 10/04/23  Pt will demonstrate ability to pick up small objects from table top (pen, coin, etc) x5 with RUE mild increased time and no drops. Baseline: very hard to pick up small items Goal status: IN PROGRESS  2.  Pt will verbalize and/or demonstrate improved ability to type/text on phone without increased pain >2/10 on pain scale. Baseline:  Goal status: IN PROGRESS  3.  Pt will demonstrate ability to utilize BUE together without any pain or difficulty to engage in simple meal prep tasks (buttering bread, using knife/fork to  cut foods, scooping foods, etc).  Baseline:  Goal status: v  4.  Pt will demonstrate improved grip strength by 10# to allow for increased ease with use  of tools and making the bed. Baseline: L: 68#, R: 21# Goal status: IN PROGRESS  5.  Pt will report improved functional use of RUE during ADLs/IADLs as evidenced by improved score on BrAT by 5 points. Baseline: 77/93 Goal status: IN PROGRESS  ASSESSMENT:  CLINICAL IMPRESSION: Pt tolerated tasks well though benefited from increased cues, time, and therapist modeling to improve understanding of novel motor movements. Continuing education of HEP recommended as needed. Pt would benefit from skilled OT services to address deficits and increase overall independence.    PERFORMANCE DEFICITS: in functional skills including ADLs, IADLs, coordination, dexterity, sensation, ROM, strength, pain, Fine motor control, Gross motor control, decreased knowledge of precautions, decreased knowledge of use of DME, and UE functional use and psychosocial skills including coping strategies and environmental adaptation.    PLAN:  OT FREQUENCY: 2x/week (2x/week for 4 weeks and then decrease to 1x/week)  OT DURATION: 8 weeks  PLANNED INTERVENTIONS: 97168 OT Re-evaluation, 97535 self care/ADL training, 40981 therapeutic exercise, 97530 therapeutic activity, 97112 neuromuscular re-education, 97140 manual therapy, 97035 ultrasound, 97010 moist heat, 97010 cryotherapy, 97032 electrical stimulation (manual), passive range of motion, coping strategies training, patient/family education, and DME and/or AE instructions  RECOMMENDED OTHER SERVICES: NA  CONSULTED AND AGREED WITH PLAN OF CARE: Patient  PLAN FOR NEXT SESSION: Review HEP, further address grasp/release.   Wynetta Emery, OTR/L 08/21/2023, 12:09 PM   [facility] [address] [place], [address] [place], [place] [address] Phone # [phone] Fax # [phone]

## 2023-08-21 NOTE — Patient Instructions (Addendum)
   Handwritten instructions: Try exercises 1-2 per day, 5-10 minutes per session (approx. 2-3 exercises). Golf ball exercise #5 removed from pt's handout.

## 2023-08-22 ENCOUNTER — Other Ambulatory Visit: Payer: Self-pay | Admitting: Internal Medicine

## 2023-08-22 MED ORDER — CYCLOBENZAPRINE HCL 5 MG PO TABS: ORAL_TABLET | ORAL | 1 refills | Status: DC

## 2023-08-23 ENCOUNTER — Encounter: Payer: Self-pay | Admitting: Internal Medicine

## 2023-08-23 ENCOUNTER — Encounter: Payer: Self-pay | Admitting: Neurology

## 2023-08-23 ENCOUNTER — Ambulatory Visit: Payer: Medicare Other | Admitting: Neurology

## 2023-08-23 VITALS — BP 175/93 | HR 80 | Ht 66.0 in | Wt 185.0 lb

## 2023-08-23 DIAGNOSIS — G5622 Lesion of ulnar nerve, left upper limb: Secondary | ICD-10-CM | POA: Diagnosis not present

## 2023-08-23 DIAGNOSIS — G545 Neuralgic amyotrophy: Secondary | ICD-10-CM

## 2023-08-23 NOTE — Progress Notes (Signed)
Follow-up Visit   Date: 08/23/2023    [name] [name] MRN: [id number] DOB: 1956/10/06    [name] [name] is a 67 y.o. ambidextrousmale with diabetes mellitus, hyperlipidemia, hypertension, bladder cancer, CAD, right renal cancer, and GERD returning to the clinic for follow-up of right brachial plexopathy.  The patient was accompanied to the clinic by self.    IMPRESSION/PLAN: Left ulnar neuropathy at the elbow, manifesting with left arm and hand paresthesias.  EMG was reviewed which show conduction velocity slowing across the elbow.  Strategies to minimize nerve compression were discussed. If symptoms get worse, consider nerve ultrasound.  Patient reassured that history or exam does not suggest brachial plexopathy in the left arm.  Neuralgic amyotrophy affecting the right arm. 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  History of pain followed by weakness also fits neuralgic amyotrophy. Serology testing has been normal.  MRI brachial plexus scheduled for later this month.  Continue OT. Diabetic neuropathy affecting the feet, mild.  Follow-up with PCP regarding TB testing   Return to clinic in 2 months  --------------------------------------------- History of present illness: Around July, he began having right shoulder pain, sharp and throbbing.  He recalls having to sleep in a recliner for several weeks.  His pain eventually resolved, but then he noticed weakness in the right hand, such that he is unable to extend his wrist or fingers.  He takes gabapentin 300mg  three times daily which helps with arm tingling.  Prior testing includes MRI cervical spine which shows disc herniation at right C5-6.  NCS/EMG performed here is consistent with right brachial plexopathy affecting primarily the middle and inferior trunks.  He is here is for further evaluation.    He  reports having a similar problem in the neck in the late 1980s, which was treated conservatively.   UPDATE 08/23/2023:  He is here for acute follow-up with complains of left hand tingling and numbness involving the 4th and 5th fingers.  He denies hand weakness.  Symptoms are mild and not constant.  He was concerned because the tingling felt the same as when his right arm started getting weak.  He has been doing OT but unfortunately not noticed any significant improvement of right hand and finger extension.  He takes gabapentin 200mg  twice daily.  He is most concern whether he has TB because he found out that his daughter has TB.  He denies fever, night sweats, weight loss, or new cough.    Medications:  Current Outpatient Medications on File Prior to Visit  Medication Sig Dispense Refill   amLODipine (NORVASC) 5 MG tablet Take 1 tablet (5 mg total) by mouth daily. 90 tablet 3   Ascorbic Acid (VITAMIN C) 1000 MG tablet Take 1,000 mg by mouth once a week.     aspirin EC 81 MG tablet Take 1 tablet (81 mg total) by mouth daily. (Patient taking differently: Take 81 mg by mouth daily. Patient takes every now and then) 100 tablet 3   blood glucose meter kit and supplies KIT Use to test blood sugar once a day. DX: E11.9 1 each 0   Blood Glucose Monitoring Suppl (CONTOUR NEXT MONITOR) w/Device KIT 1 Device by Does not apply route daily. 1 kit 0   cholecalciferol (VITAMIN D) 1000 UNITS tablet Take 1,000 Units by mouth daily.     clobetasol (TEMOVATE) 0.05 % external solution Apply 1 Application topically  2 (two) times daily as needed. Itchy scalp 50 mL 3   CONTOUR NEXT TEST test strip USE AS DIRECTED 100 strip 5   cyclobenzaprine (FLEXERIL) 5 MG tablet TAKE 1 TABLET BY MOUTH THREE TIMES A DAY AS NEEDED FOR MUSCLE SPASMS 90 tablet 1   gabapentin (NEURONTIN) 100 MG capsule Take 1 capsule (100 mg total) by mouth 4 (four) times daily as needed. (Patient taking differently: Take 100 mg by mouth 4 (four) times daily  as needed. 2 capsules in the morning, 2 capsules in the afternoon, and 3 capsules at bedtime) 120 capsule 3   glimepiride (AMARYL) 4 MG tablet Take 1 tablet (4 mg total) by mouth in the morning and at bedtime. 180 tablet 3   ibuprofen (ADVIL) 800 MG tablet TAKE 1 TABLET (800 MG TOTAL) BY MOUTH DAILY AS NEEDED FOR MODERATE PAIN. 30 tablet 0   ketoconazole (NIZORAL) 2 % cream Apply 1 Application topically daily as needed for irritation.     Lancets MISC Use to check blood sugar once per day. 100 each 3   metFORMIN (GLUCOPHAGE) 500 MG tablet TAKE 1 TABLET BY MOUTH 2 TIMES DAILY WITH A MEAL. 180 tablet 3   olmesartan (BENICAR) 40 MG tablet TAKE 1 TABLET BY MOUTH EVERY DAY 90 tablet 3   potassium chloride SA (KLOR-CON) 20 MEQ tablet Take 1 tablet (20 mEq total) by mouth daily. (Patient taking differently: Take 20 mEq by mouth 2 (two) times a week.) 90 tablet 11   tadalafil (CIALIS) 5 MG tablet Take 5 mg by mouth daily.     HYDROcodone-acetaminophen (NORCO) 10-325 MG tablet Take 1 tablet by mouth 2 (two) times daily as needed for severe pain. (Patient not taking: Reported on 08/23/2023) 40 tablet 0   RYBELSUS 14 MG TABS Take 1 tablet by mouth daily. (Patient not taking: Reported on 08/23/2023)     Current Facility-Administered Medications on File Prior to Visit  Medication Dose Route Frequency Provider Last Rate Last Admin   gemcitabine (GEMZAR) chemo syringe for bladder instillation 2,000 mg  2,000 mg Bladder Instillation Once Bjorn Pippin, MD        Allergies:  Allergies  Allergen Reactions   Tamsulosin Other (See Comments)    Unable to perform sexually   Codeine Itching and Rash   Wound Dressing Adhesive Itching and Other (See Comments)    Band-Aid adhesive    Vital Signs:  BP (!) 175/93   Pulse 80   Ht 5\' 6"  (1.676 m)   Wt 185 lb (83.9 kg)   SpO2 97%   BMI 29.86 kg/m    Neurological Exam: MENTAL STATUS including orientation to time, place, person, recent and remote memory, attention  span and concentration, language, and fund of knowledge is normal.  Speech is not dysarthric.  CRANIAL NERVES:  Pupils equal round and reactive to light.  Normal conjugate, extra-ocular eye movements in all directions of gaze.  No ptosis.  Face is symmetric.   Upper Extremity:  Right   Left  Deltoid  5/5    5/5   Biceps  5/5    5/5   Triceps  5/5    5/5   Wrist extensors  4/5    5/5   Wrist flexors  5/5    5/5   Finger extensors  1/5    5/5   Finger flexors  4/5    5/5   Dorsal interossei  4/5    5/5   Abductor pollicis  4/5  5/5   Tone (Ashworth scale)  0   0    Lower Extremity:  Right   Left  Hip flexors  5/5    5/5   Knee flexors  5/5    5/5   Knee extensors  5/5    5/5   Dorsiflexors  5/5    5/5   Plantarflexors  5/5    5/5   Toe extensors  5/5    5/5   Toe flexors  5/5    5/5   Tone (Ashworth scale)  0   0    MSRs:                                              Right        Left brachioradialis 2+   2+  biceps 2+   2+  triceps 2+   2+  patellar 2+   2+  ankle jerk 1+   1+  Hoffman no   no  plantar response down   down    SENSORY:   Reduced vibration at the great toe bilaterally. Temperature and pin prick intact.      COORDINATION/GAIT: Normal finger-to- nose-finger.  Intact rapid alternating movements bilaterally.   Gait narrow based and stable.   Data: MRI cervical spine wo contrast 07/03/2023: 1. 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 2. C5-6: Spondylosis with endplate osteophytes and protruding disc material more prominent in the right posterolateral direction. Effacement of the subarachnoid space with indentation of the cord, particularly on the right. AP diameter of the canal in the midline 7.4 mm. Foraminal stenosis right worse than left. In particular, the right C6 nerve root is likely affected. 3. C6-7: Endplate osteophytes and bulging of the disc more prominent towards the right.  Moderate foraminal narrowing on the right that could possibly affect the C7 nerve. This is not as severe as the level above.   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 Additionally, there is evidence of a chronic C7 radiculopathy (mild) and left ulnar neuropathy across the elbow (very mild).   Total time spent reviewing records, interview, history/exam, documentation, and coordination of care on day of encounter:  30 min    Thank you for allowing me to participate in patient's care.  If I can answer any additional questions, I would be pleased to do so.    Sincerely,    Kimba Lottes K. Allena Katz, DO

## 2023-08-26 ENCOUNTER — Ambulatory Visit (INDEPENDENT_AMBULATORY_CARE_PROVIDER_SITE_OTHER): Payer: Medicare Other | Admitting: Internal Medicine

## 2023-08-26 ENCOUNTER — Other Ambulatory Visit: Payer: Self-pay | Admitting: Internal Medicine

## 2023-08-26 ENCOUNTER — Ambulatory Visit: Payer: Medicare Other | Admitting: Occupational Therapy

## 2023-08-26 ENCOUNTER — Encounter: Payer: Self-pay | Admitting: Internal Medicine

## 2023-08-26 ENCOUNTER — Ambulatory Visit (INDEPENDENT_AMBULATORY_CARE_PROVIDER_SITE_OTHER): Payer: Medicare Other

## 2023-08-26 VITALS — BP 122/80 | HR 84 | Temp 98.7°F | Ht 66.0 in | Wt 186.0 lb

## 2023-08-26 DIAGNOSIS — E1142 Type 2 diabetes mellitus with diabetic polyneuropathy: Secondary | ICD-10-CM | POA: Diagnosis not present

## 2023-08-26 DIAGNOSIS — E118 Type 2 diabetes mellitus with unspecified complications: Secondary | ICD-10-CM | POA: Diagnosis not present

## 2023-08-26 DIAGNOSIS — Z201 Contact with and (suspected) exposure to tuberculosis: Secondary | ICD-10-CM | POA: Diagnosis not present

## 2023-08-26 DIAGNOSIS — R059 Cough, unspecified: Secondary | ICD-10-CM | POA: Diagnosis not present

## 2023-08-26 DIAGNOSIS — G540 Brachial plexus disorders: Secondary | ICD-10-CM

## 2023-08-26 DIAGNOSIS — C679 Malignant neoplasm of bladder, unspecified: Secondary | ICD-10-CM | POA: Diagnosis not present

## 2023-08-26 NOTE — Assessment & Plan Note (Signed)
H/o BCG treatments

## 2023-08-26 NOTE — Assessment & Plan Note (Addendum)
Tharun's daughter-law has been diagnosed with TB and by researching they have associated TB with my BCG bladder treatment -- "fearing I am the source". Check Gold test, CXR

## 2023-08-26 NOTE — Progress Notes (Signed)
Subjective:  Patient ID: [name], male    DOB: 06-21-1956  Age: 67 y.o. MRN: [id number]  CC: Medical Management of Chronic Issues (6 WEEK F/U)   HPI TYRI SCHMIDT presents for neuralgic amyotrophy affecting the right arm. 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  History of pain followed by weakness also fits neuralgic amyotrophy. Serology testing has been normal.    Shishir's daughter-in-law has been diagnosed with TB and by researching they have associated TB with my BCG bladder treatment -- "fearing I am the source".    Outpatient Medications Prior to Visit  Medication Sig Dispense Refill   amLODipine (NORVASC) 5 MG tablet Take 1 tablet (5 mg total) by mouth daily. 90 tablet 3   Ascorbic Acid (VITAMIN C) 1000 MG tablet Take 1,000 mg by mouth once a week.     aspirin EC 81 MG tablet Take 1 tablet (81 mg total) by mouth daily. (Patient taking differently: Take 81 mg by mouth daily. Patient takes every now and then) 100 tablet 3   blood glucose meter kit and supplies KIT Use to test blood sugar once a day. DX: E11.9 1 each 0   Blood Glucose Monitoring Suppl (CONTOUR NEXT MONITOR) w/Device KIT 1 Device by Does not apply route daily. 1 kit 0   cholecalciferol (VITAMIN D) 1000 UNITS tablet Take 1,000 Units by mouth daily.     clobetasol (TEMOVATE) 0.05 % external solution Apply 1 Application topically 2 (two) times daily as needed. Itchy scalp 50 mL 3   CONTOUR NEXT TEST test strip USE AS DIRECTED 100 strip 5   cyclobenzaprine (FLEXERIL) 5 MG tablet TAKE 1 TABLET BY MOUTH THREE TIMES A DAY AS NEEDED FOR MUSCLE SPASMS 90 tablet 1   gabapentin (NEURONTIN) 100 MG capsule Take 1 capsule (100 mg total) by mouth 4 (four) times daily as needed. (Patient taking differently: Take 100 mg by mouth 4 (four) times daily as needed. 2 capsules in the morning, 2 capsules in  the afternoon, and 3 capsules at bedtime) 120 capsule 3   glimepiride (AMARYL) 4 MG tablet Take 1 tablet (4 mg total) by mouth in the morning and at bedtime. 180 tablet 3   ibuprofen (ADVIL) 800 MG tablet TAKE 1 TABLET (800 MG TOTAL) BY MOUTH DAILY AS NEEDED FOR MODERATE PAIN. 30 tablet 0   ketoconazole (NIZORAL) 2 % cream Apply 1 Application topically daily as needed for irritation.     Lancets MISC Use to check blood sugar once per day. 100 each 3   metFORMIN (GLUCOPHAGE) 500 MG tablet TAKE 1 TABLET BY MOUTH 2 TIMES DAILY WITH A MEAL. 180 tablet 3   olmesartan (BENICAR) 40 MG tablet TAKE 1 TABLET BY MOUTH EVERY DAY 90 tablet 3   potassium chloride SA (KLOR-CON) 20 MEQ tablet Take 1 tablet (20 mEq total) by mouth daily. (Patient taking differently: Take 20 mEq by mouth 2 (two) times a week.) 90 tablet 11   tadalafil (CIALIS) 5 MG tablet Take 5 mg by mouth daily.     HYDROcodone-acetaminophen (NORCO) 10-325 MG tablet Take 1 tablet by mouth 2 (two) times daily as needed for severe pain. (Patient not taking: Reported on 08/23/2023) 40 tablet 0   RYBELSUS 14 MG TABS Take 1 tablet by mouth daily. (Patient not taking: Reported on 08/23/2023)     Facility-Administered Medications Prior to Visit  Medication Dose Route Frequency  Provider Last Rate Last Admin   gemcitabine (GEMZAR) chemo syringe for bladder instillation 2,000 mg  2,000 mg Bladder Instillation Once Bjorn Pippin, MD        ROS: Review of Systems  Constitutional:  Negative for appetite change, fatigue and unexpected weight change.  HENT:  Negative for congestion, nosebleeds, sneezing, sore throat and trouble swallowing.   Eyes:  Negative for itching and visual disturbance.  Respiratory:  Negative for cough.   Cardiovascular:  Negative for chest pain, palpitations and leg swelling.  Gastrointestinal:  Negative for abdominal distention, blood in stool, diarrhea and nausea.  Genitourinary:  Negative for frequency and hematuria.   Musculoskeletal:  Negative for back pain, gait problem, joint swelling and neck pain.  Skin:  Negative for rash.  Neurological:  Positive for weakness. Negative for dizziness, tremors and speech difficulty.  Psychiatric/Behavioral:  Negative for agitation, dysphoric mood and sleep disturbance. The patient is not nervous/anxious.     Objective:  BP 122/80 (BP Location: Left Arm, Patient Position: Sitting, Cuff Size: Normal)   Pulse 84   Temp 98.7 F (37.1 C) (Oral)   Ht 5\' 6"  (1.676 m)   Wt 186 lb (84.4 kg)   SpO2 90%   BMI 30.02 kg/m   BP Readings from Last 3 Encounters:  08/26/23 122/80  08/23/23 (!) 175/93  07/31/23 (!) 174/94    Wt Readings from Last 3 Encounters:  08/26/23 186 lb (84.4 kg)  08/23/23 185 lb (83.9 kg)  07/31/23 186 lb (84.4 kg)    Physical Exam Constitutional:      General: He is not in acute distress.    Appearance: He is well-developed. He is obese.     Comments: NAD  Eyes:     Conjunctiva/sclera: Conjunctivae normal.     Pupils: Pupils are equal, round, and reactive to light.  Neck:     Thyroid: No thyromegaly.     Vascular: No JVD.  Cardiovascular:     Rate and Rhythm: Normal rate and regular rhythm.     Heart sounds: Normal heart sounds. No murmur heard.    No friction rub. No gallop.  Pulmonary:     Effort: Pulmonary effort is normal. No respiratory distress.     Breath sounds: Normal breath sounds. No wheezing or rales.  Chest:     Chest wall: No tenderness.  Abdominal:     General: Bowel sounds are normal. There is no distension.     Palpations: Abdomen is soft. There is no mass.     Tenderness: There is no abdominal tenderness. There is no guarding or rebound.  Musculoskeletal:        General: Tenderness present. Normal range of motion.     Cervical back: Normal range of motion.  Lymphadenopathy:     Cervical: No cervical adenopathy.  Skin:    General: Skin is warm and dry.     Findings: No rash.  Neurological:     Mental  Status: He is alert and oriented to person, place, and time.     Cranial Nerves: No cranial nerve deficit.     Motor: No abnormal muscle tone.     Coordination: Coordination normal.     Gait: Gait normal.     Deep Tendon Reflexes: Reflexes are normal and symmetric.  Psychiatric:        Behavior: Behavior normal.        Thought Content: Thought content normal.        Judgment: Judgment normal.  Lab Results  Component Value Date   WBC 9.9 02/12/2023   HGB 16.2 02/12/2023   HCT 47.1 02/12/2023   PLT 186.0 02/12/2023   GLUCOSE 234 (H) 04/22/2023   CHOL 153 03/06/2021   TRIG 170.0 (H) 03/06/2021   HDL 33.50 (L) 03/06/2021   LDLCALC 86 03/06/2021   ALT 29 02/12/2023   AST 15 02/12/2023   NA 139 04/22/2023   K 4.2 04/22/2023   CL 103 04/22/2023   CREATININE 0.92 04/22/2023   BUN 17 04/22/2023   CO2 28 04/22/2023   TSH 0.64 03/06/2021   PSA 3.87 03/06/2021   INR 1.1 (H) 01/08/2012   HGBA1C 8.5 (H) 04/22/2023   MICROALBUR 1.3 03/06/2021    MR Cervical Spine Wo Contrast  Result Date: 07/03/2023 CLINICAL DATA:  Neck pain. Chronic degenerative changes. Right arm radicular pain. Right upper extremity pain and weakness with some numbness. Symptoms worsening over the last 2 months. EXAM: MRI CERVICAL SPINE WITHOUT CONTRAST TECHNIQUE: Multiplanar, multisequence MR imaging of the cervical spine was performed. No intravenous contrast was administered. COMPARISON:  Radiography 04/30/2023 FINDINGS: Alignment: Normal Vertebrae: No fracture or focal bone lesion. No edematous endplate marrow changes or edematous facet arthritis. Cord: No primary cord lesion.  See below regarding stenosis. Posterior Fossa, vertebral arteries, paraspinal tissues: Negative Disc levels: Foramen magnum, C1-2 and C2-3 levels are normal. C3-4: Chronic degenerative spondylosis with endplate osteophytes and mild bulging of the disc. Mild canal narrowing with AP diameter in the midline 8.5 mm. No cord compression. Mild  bilateral foraminal narrowing, not visibly compressive. C4-5: Mild bulging of the disc.  No canal or foraminal stenosis. C5-6: Spondylosis with endplate osteophytes and protruding disc material more prominent in the right posterolateral direction. Effacement of the subarachnoid space with indentation of the cord, particularly on the right. AP diameter of the canal in the midline 7.4 mm. Foraminal stenosis right worse than left. In particular, the right C6 nerve root is likely affected. C6-7: Endplate osteophytes and bulging of the disc more prominent towards the right. No compressive canal stenosis. Moderate foraminal narrowing on the right that could possibly affect the C7 nerve. This is not as severe as the level above. 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 IMPRESSION: 1. 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 2. C5-6: Spondylosis with endplate osteophytes and protruding disc material more prominent in the right posterolateral direction. Effacement of the subarachnoid space with indentation of the cord, particularly on the right. AP diameter of the canal in the midline 7.4 mm. Foraminal stenosis right worse than left. In particular, the right C6 nerve root is likely affected. 3. C6-7: Endplate osteophytes and bulging of the disc more prominent towards the right. Moderate foraminal narrowing on the right that could possibly affect the C7 nerve. This is not as severe as the level above. Electronically Signed   By: Paulina Fusi M.D.   On: 07/03/2023 17:38    Assessment & Plan:   Problem List Items Addressed This Visit     DM type 2, controlled, with complication (HCC)    Monitor A1c      Polyneuropathy due to type 2 diabetes mellitus (HCC)    Neuralgic amyotrophy affecting the right arm. EMG most consistent with brachial plexopathy affecting the middle and inferior  trunks, resulting his right hand weakness, especially with wrist and finger extension.  History of pain followed by weakness also fits neuralgic  amyotrophy. Serology testing has been normal.   10% better F/u w/Dr Allena Katz      Bladder cancer [facility])    H/o BCG treatments      Brachial plexopathy - Primary    Neuralgic amyotrophy affecting the right arm. 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  History of pain followed by weakness also fits neuralgic amyotrophy. Serology testing has been normal.   10% better F/u w/Dr Allena Katz      Exposure to TB    Dushaun's daughter-law has been diagnosed with TB and by researching they have associated TB with my BCG bladder treatment -- "fearing I am the source". Check Gold test, CXR      Relevant Orders   QuantiFERON-TB Gold Plus   DG Chest 2 View      No orders of the defined types were placed in this encounter.     Follow-up: No follow-ups on file.  Sonda Primes, MD

## 2023-08-26 NOTE — Assessment & Plan Note (Signed)
Monitor A1c 

## 2023-08-26 NOTE — Assessment & Plan Note (Signed)
Neuralgic amyotrophy affecting the right arm. 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  History of pain followed by weakness also fits neuralgic amyotrophy. Serology testing has been normal.   10% better F/u w/Dr Allena Katz

## 2023-08-27 ENCOUNTER — Other Ambulatory Visit: Payer: Medicare Other

## 2023-08-27 LAB — COMPREHENSIVE METABOLIC PANEL
ALT: 24 U/L (ref 0–53)
AST: 13 U/L (ref 0–37)
Albumin: 4.3 g/dL (ref 3.5–5.2)
Alkaline Phosphatase: 97 U/L (ref 39–117)
BUN: 15 mg/dL (ref 6–23)
CO2: 27 meq/L (ref 19–32)
Calcium: 9.4 mg/dL (ref 8.4–10.5)
Chloride: 104 meq/L (ref 96–112)
Creatinine, Ser: 0.71 mg/dL (ref 0.40–1.50)
GFR: 93.89 mL/min (ref >60.00)
Glucose, Bld: 183 mg/dL — ABNORMAL HIGH (ref 70–99)
Potassium: 3.4 meq/L — ABNORMAL LOW (ref 3.5–5.1)
Sodium: 139 meq/L (ref 135–145)
Total Bilirubin: 0.6 mg/dL (ref 0.2–1.2)
Total Protein: 6.8 g/dL (ref 6.0–8.3)

## 2023-08-27 LAB — HEMOGLOBIN A1C: Hgb A1c MFr Bld: 7.4 % — ABNORMAL HIGH (ref 4.6–6.5)

## 2023-08-28 ENCOUNTER — Ambulatory Visit: Payer: Medicare Other | Admitting: Occupational Therapy

## 2023-08-28 DIAGNOSIS — M6281 Muscle weakness (generalized): Secondary | ICD-10-CM | POA: Diagnosis not present

## 2023-08-28 DIAGNOSIS — R278 Other lack of coordination: Secondary | ICD-10-CM | POA: Diagnosis not present

## 2023-08-28 DIAGNOSIS — R208 Other disturbances of skin sensation: Secondary | ICD-10-CM

## 2023-08-28 DIAGNOSIS — G8193 Hemiplegia, unspecified affecting right nondominant side: Secondary | ICD-10-CM | POA: Diagnosis not present

## 2023-08-28 LAB — QUANTIFERON-TB GOLD PLUS
QuantiFERON Mitogen Value: >10 [IU]/mL
QuantiFERON Nil Value: 0.04 [IU]/mL
QuantiFERON TB1 Ag Value: 0.04 [IU]/mL
QuantiFERON TB2 Ag Value: 0.04 [IU]/mL
QuantiFERON-TB Gold Plus: NEGATIVE

## 2023-08-28 NOTE — Therapy (Signed)
OUTPATIENT OCCUPATIONAL THERAPY NEURO  Treatment Note  Patient Name: [name] [name] MRN: [id number] DOB:1956-03-12, 67 y.o., male Today's Date: 08/28/2023  PCP: Tresa Garter, MD REFERRING PROVIDER: Glendale Chard, DO  END OF SESSION:  OT End of Session - 08/28/23 1120     Visit Number 6    Number of Visits 13    Date for OT Re-Evaluation 10/04/23    Authorization Type [employer] Medicare    OT Start Time 0933    OT Stop Time 1016    OT Time Calculation (min) 43 min    Activity Tolerance Patient tolerated treatment well    Behavior During Therapy [facility] for tasks assessed/performed                  Past Medical History:  Diagnosis Date   Benign localized prostatic hyperplasia with lower urinary tract symptoms (LUTS)    urologist--- dr Annabell Howells   Bladder cancer [facility])    urologist---- dr Annabell Howells---  s/p TURBT 11/2020; 03/ 2022   Coronary artery disease    mild   DDD (degenerative disc disease), cervical    From 1980s   Diabetes mellitus type II    followed by pcp--   (01-05-2021  per pt checks blood sugar three times per week,  fasting sugar --- 130--140_   Difficult intubation 07/28/2012   partial nephrectomy done @[facility]  07-28-2012  found to have anterior larynx , diffcult intubation, referred to anesthesia record in epic    Dyslipidemia    ED (erectile dysfunction)    Elevated PSA    GERD (gastroesophageal reflux disease)    History of adenomatous polyp of colon    History of kidney stones    History of prostatitis 2008   History of renal carcinoma urologist--- dr Annabell Howells   07-28-2012 for renal neoplasm  s/p right partal nephrectomy    Hypertension    followed by pcp   Osteoarthritis    Back   Renal carcinoma, right [facility])    Past Surgical History:  Procedure Laterality Date   CATARACT EXTRACTION     COLONOSCOPY N/A 03/03/2013   Procedure: COLONOSCOPY;  Surgeon: Hilarie Fredrickson, MD;  Location: [facility];  Service: Endoscopy;  Laterality: N/A;    COLONOSCOPY WITH PROPOFOL N/A 09/29/2019   Procedure: COLONOSCOPY WITH PROPOFOL;  Surgeon: Hilarie Fredrickson, MD;  Location: [facility];  Service: Endoscopy;  Laterality: N/A;   CYSTOSCOPY WITH BIOPSY N/A 12/09/2020   Procedure: CYSTOSCOPY WITH TURBT, BIOPSY AND FULGURATION;  Surgeon: Bjorn Pippin, MD;  Location: [facility];  Service: Urology;  Laterality: N/A;  REQUESTING 45 MINS   EXTRACORPOREAL SHOCK WAVE LITHOTRIPSY  01-10-2015;  03-03-2012  @[facility]    POLYPECTOMY  09/29/2019   Procedure: POLYPECTOMY;  Surgeon: Hilarie Fredrickson, MD;  Location: [facility];  Service: Endoscopy;;   ROBOT ASSISTED LAPAROSCOPIC PARTIAL NEPHRECTOMY  07-28-2012   DR Laverle Patter  @[facility]    TONSILLECTOMY  1965   TRANSURETHRAL RESECTION OF BLADDER TUMOR WITH MITOMYCIN-C N/A 01/06/2021   Procedure: TRANSURETHRAL RESECTION OF BLADDER TUMOR WITH GEMCITIBINE IN PACU;  Surgeon: Bjorn Pippin, MD;  Location: [facility];  Service: Urology;  Laterality: N/A;   TRANSURETHRAL RESECTION OF BLADDER TUMOR WITH MITOMYCIN-C N/A 09/01/2021   Procedure: TRANSURETHRAL RESECTION OF BLADDER TUMOR WITH POST OPERATIVE INSTILLATION OF GEMCITABINE;  Surgeon: Bjorn Pippin, MD;  Location: [facility];  Service: Urology;  Laterality: N/A;   TRANSURETHRAL RESECTION OF BLADDER TUMOR WITH MITOMYCIN-C Bilateral 11/13/2022   Procedure: CYSTOSCOPY TRANSURETHRAL RESECTION  OF BLADDER TUMOR BILATERAL RETROGRADES WITH INSTILL GEMCTABINE;  Surgeon: Bjorn Pippin, MD;  Location: [facility];  Service: Urology;  Laterality: Bilateral;  1 HR FOR CASE   Patient Active Problem List   Diagnosis Date Noted   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   Exposure to TB 08/26/2023   Cervical radiculitis 04/30/2023   Diabetic neuropathy (HCC) 04/23/2023   Intractable hiccups 02/05/2023   Upper abdominal pain 02/05/2023   Gastroesophageal reflux disease 02/05/2023   EKG abnormalities 02/05/2023   Scalp itch 01/23/2023   Tetrahydrocannabinol (THC) use disorder, moderate,  dependence (HCC) 01/23/2023   Polyneuropathy due to type 2 diabetes mellitus (HCC) 10/23/2022   Bladder cancer (HCC) 10/23/2022   Memory problem 07/17/2022   Leg pain, anterior, right 12/26/2021   Gross hematuria 09/16/2021   Statin declined 08/06/2021   Acute abscess of face 08/03/2021   Atherosclerosis of aorta (HCC) 04/26/2021   Coronary atherosclerosis 01/08/2021   Ascending aortic aneurysm (HCC) 01/08/2021   Bladder neck obstruction 10/20/2019   History of colonic polyps    Benign neoplasm of ascending colon    Benign neoplasm of transverse colon    Viral illness 06/16/2019   Exposure to COVID-19 virus 02/02/2019   Cerumen impaction 08/27/2018   Pilonidal cyst 01/18/2017   Neoplasm of uncertain behavior of skin 01/18/2017   Hypokalemia 12/11/2016   Tobacco use disorder 10/17/2016   Well adult exam 06/22/2016   Polycythemia, 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   Abscessed tooth 07/06/2015   Sebaceous cyst 07/06/2015   Common cold virus 12/23/2014   Infected sebaceous cyst 11/09/2014   Dyslipidemia 05/19/2013   URI, acute 09/15/2012   Strep throat 09/15/2012   Renal cell cancer (HCC) 09/02/2012   Adrenal mass (HCC) 06/03/2012   Nephrolithiasis 06/03/2012   Otitis media 01/08/2012   Dark urine 01/08/2012   Decreased vision in both eyes 01/08/2012   Onychomycosis 05/25/2011   DM type 2, controlled, with complication (HCC) 05/23/2010   Keloid scar 03/07/2010   SKIN RASH 03/07/2010   LACERATION, FINGER 09/15/2009   Osteoarthritis 08/15/2009   LOW BACK PAIN 08/15/2009   CELLULITIS AND ABSCESS OF BUTTOCK 03/29/2009   CERVICAL RADICULOPATHY 11/09/2008   WARTS, VIRAL, UNSPECIFIED 10/28/2007   Essential hypertension 06/30/2007   CLUSTER HEADACHE 06/27/2007   PSA, INCREASED 06/27/2007    ONSET DATE: June-July 2024, referral date 07/31/23  REFERRING DIAG: G54.5 (ICD-10-CM) - Neuralgic amyotrophy of right brachial plexus  THERAPY DIAG:  Hemiplegia affecting right nondominant side,  unspecified etiology, unspecified hemiplegia type (HCC)  Other lack of coordination  Other disturbances of skin sensation  Muscle weakness (generalized)  Rationale for Evaluation and Treatment: Rehabilitation  SUBJECTIVE:   SUBJECTIVE STATEMENT: Pt reports taking less Gabapentin d/t dizziness when taking the medication. Pt reports using RUE throughout functional tasks during the day and "tries to remember to do [the home exercises]." Pt reports approx. "10% improvement" of RUE.  Pt accompanied by: self  PERTINENT HISTORY: diabetes mellitus, hyperlipidemia, hypertension, bladder cancer, CAD, right renal cancer, and GERD presenting for evaluation of right hand weakness.   PRECAUTIONS: None  WEIGHT BEARING RESTRICTIONS: No  PAIN:  Are you having pain? Yes: NPRS scale: 1/10 Pain location: dorsal aspect of R hand Pain description: soreness, numbness Aggravating factors: stretching, always there Relieving factors: ibuprofen   FALLS: Has patient fallen in last 6 months? No, "had some close calls"  LIVING ENVIRONMENT: Lives with: lives with their family (spouse and 26 yo grandson) Lives in: House/apartment Stairs: Yes: External: ramp to back entrance, front entrance has  5-6 steps Has following equipment at home: Ramped entry, comfort height toilet  PLOF: Independent and Independent with basic ADLs  PATIENT GOALS: to be able to open/close my hand and use fingers  OBJECTIVE:  Note: Objective measures were completed at Evaluation unless otherwise noted.  HAND DOMINANCE: Ambidextrous, writes and eat with L hand, bats/throws with R hand  ADLs: Overall ADLs: "I've just had to learn how to do things differently" Transfers/ambulation related to ADLs: Independent Eating: Mod I Grooming: Mod I UB Dressing: difficulty finding arm hole on long sleeve shirts LB Dressing: difficulty with getting zipper down on jeans Toileting: Independent Bathing: Independent Tub Shower transfers:  Independent Equipment: none  IADLs: Light housekeeping: Mod I Meal Prep: Mod I,  "I've just had to learn how to do things differently" utilizing 2 hand chop when making BBQ Community mobility: driving Medication management: increased time  MOBILITY STATUS: Independent  POSTURE COMMENTS:  No Significant postural limitations  ACTIVITY TOLERANCE: Activity tolerance: WFL for tasks assessed  FUNCTIONAL OUTCOME MEASURES: Quick Dash: 13.63 impairment The Brachial Assessment Tool: 77/93 Subscale 1: dressing items: 20/24 Subscale 2: arm and hand items: 42/51 Subscale 3: no hand items 15/18  UPPER EXTREMITY ROM:  decreased wrist extension against gravity, decresaed finger extension long, ring, and small finger  Active ROM Right eval Left eval  Shoulder flexion [facility]  Shoulder abduction [facility]  Shoulder adduction    Shoulder extension    Shoulder internal rotation [facility]  Shoulder external rotation [facility]  Elbow flexion [facility] WFL  Elbow extension [facility] WFL  Wrist flexion [facility] WFL  Wrist extension [facility] WFL  Wrist ulnar deviation    Wrist radial deviation    Wrist pronation WFL WFL  Wrist supination WFL WFL  (Blank rows = not tested)  UPPER EXTREMITY MMT:     MMT Right eval Left eval  Shoulder flexion 5/5 5/5  Shoulder abduction 5/5 5/5  Shoulder adduction    Shoulder extension    Shoulder internal rotation 5/5 5/5  Shoulder external rotation 5/5 5/5  Middle trapezius    Lower trapezius    Elbow flexion 5/5 5/5  Elbow extension 5/5 5/5  Wrist flexion 5/5 5/5  Wrist extension 4/5 5/5  Wrist ulnar deviation    Wrist radial deviation    Wrist pronation    Wrist supination    (Blank rows = not tested)  RUE: Finger extensors 1/5  Finger flexors 4/5   Dorsal interossei 4/5   Abductor pollicis 4/5    HAND FUNCTION: Grip strength: Right: 68 lbs; Left: 21 lbs  COORDINATION: Box and Blocks:  Right 38 blocks, Left 50 blocks  SENSATION: Reporting  tingling  EDEMA: NA  COGNITION: Overall cognitive status: Within functional limits for tasks assessed  OBSERVATIONS: Pt demonstrating decreased wrist extension against gravity during box and blocks assessment.  Pt able to complete thumb opposition, however with flat fingers as opposed to rounded "O" shape.  Pt with difficulty with finger abduction and extension from table top.   TODAY'S TREATMENT:  DATE:  08/21/23  Therapeutic Activities -  OT educated pt on completing HEP for targeted RUE movement and to improve outcomes. Pt acknowledged understanding though reports inconsistent completion of HEP. OT encouraged pt to continue using RUE during daily tasks, pt verbalized understanding. OT recommended pt complete at least 2-3 exercises per day of HEP. Pt verbalized understanding.  TherEx: Pt requested to review some exercises of HEP. Therefore, OT reviewed 2-3 exercises of HEP with emphasis on RUE AROM, RUE positioning, pain threshold, gentle stretch with L hand using PROM then active RUE hold PRN. Pt demo'd understanding.  Therapeutic Activities -  Placing/removing resistive clips, reaching across midline with RUE - 3-digit pinch - to improve functional reach of RUE, RUE strengthening, FM coordination - Pt placed x 10 yellow clips, x10 green clips, x10 blue clips, x10 red clips, x3 black clips. Pt tolerated task well with increasing resistance though reported some difficulty with positioning RUE to pinch with digits 1-3. Pt denied pain after task though reported some RUE fatigue. After placing/removing clips, pt completed PROM digit 4 and 5 ext (ulnar nerve stretch), PROM digits 2-5 ext,  and radial/ulnar deviation of wrist to decrease RUE fatigue.  Scooping small objects with spoon - to improve FM dexterity, functional grasp of eating utensils, FM coordination - Pt tolerated task very well, therefore task graded up.    Progressed to picking up small beans with tip pinch and placing in container with slot, then progressed to picking up x2 small beans with finger-to-palm then palm-to-finger translation to place in container with slot. - to improve RUE FM dexterity, coordination. - Pt required gravity assistance to complete palm-to-finger translation. OT recommended to pt to complete similar tasks at home using coins, marbles, small food items, checkers. Pt verbalized understanding.   PATIENT EDUCATION: Education details: UE anatomy, see today's treatment above Person educated: Patient Education method: Explanation, Demonstration, and Verbal cues Education comprehension: verbalized understanding and needs further education  HOME EXERCISE PROGRAM: Access Code: [email or web address] URL: [email or web address] Date: 08/12/2023 Prepared by: [facility] - Outpatient  Rehab - [facility]  Exercises - Wrist Flexion Extension AROM with Fingers Curled and Palm Down  - 3 x daily - 5 reps - Thumb Opposition  - 3 x daily - 5 reps - Wrist Prayer Stretch  - 3 x daily - 5 reps - Ulnar Nerve/Median Glide- Low Level  - 3 x daily - 5 reps - Ulnar Nerve Mobilization - Low Level  - 3 x daily - 5 reps - Wrist Radial Ulnar Deviation AROM  - 3 x daily - 5 reps - Finger walking  - 3 x daily - 5 reps  08/28/23 - Palm-to-finger translation and return of coins, marbles, small food items, checkers, etc. To target FM coordination.   GOALS: Goals reviewed with patient? Yes  SHORT TERM GOALS: Target date: 09/06/23  Pt will be independent in ROM and strengthening HEP. Baseline: Goal status: IN PROGRESS  2.  Pt will verbalize understanding of adaptive strategies, task modifications and/or potential AE needs to increase ease, safety, and independence w/ ADLs and IADLs. Baseline:  Goal status: IN PROGRESS  3.  Pt will demonstrate improved UE functional use for ADLs as evidenced by increasing box/ blocks score by 4 blocks  with RUE Baseline: Right 38 blocks, Left 50 blocks Goal status: IN PROGRESS   LONG TERM GOALS: Target date: 10/04/23  Pt will demonstrate ability to pick up small objects from table top (pen, coin, etc) x5 with RUE mild increased  time and no drops. Baseline: very hard to pick up small items Goal status: IN PROGRESS  2.  Pt will verbalize and/or demonstrate improved ability to type/text on phone without increased pain >2/10 on pain scale. Baseline:  Goal status: IN PROGRESS  3.  Pt will demonstrate ability to utilize BUE together without any pain or difficulty to engage in simple meal prep tasks (buttering bread, using knife/fork to cut foods, scooping foods, etc).  Baseline:  Goal status: v  4.  Pt will demonstrate improved grip strength by 10# to allow for increased ease with use of tools and making the bed. Baseline: L: 68#, R: 21# Goal status: IN PROGRESS  5.  Pt will report improved functional use of RUE during ADLs/IADLs as evidenced by improved score on BrAT by 5 points. Baseline: 77/93 Goal status: IN PROGRESS  ASSESSMENT:  CLINICAL IMPRESSION: Pt tolerated tasks well today. Continuing education of HEP recommended as needed. Pt would benefit from skilled OT services to address deficits and increase overall independence.    PERFORMANCE DEFICITS: in functional skills including ADLs, IADLs, coordination, dexterity, sensation, ROM, strength, pain, Fine motor control, Gross motor control, decreased knowledge of precautions, decreased knowledge of use of DME, and UE functional use and psychosocial skills including coping strategies and environmental adaptation.    PLAN:  OT FREQUENCY: 2x/week (2x/week for 4 weeks and then decrease to 1x/week)  OT DURATION: 8 weeks  PLANNED INTERVENTIONS: 97168 OT Re-evaluation, 97535 self care/ADL training, 56213 therapeutic exercise, 97530 therapeutic activity, 97112 neuromuscular re-education, 97140 manual therapy, 97035 ultrasound, 97010  moist heat, 97010 cryotherapy, 97032 electrical stimulation (manual), passive range of motion, coping strategies training, patient/family education, and DME and/or AE instructions  RECOMMENDED OTHER SERVICES: NA  CONSULTED AND AGREED WITH PLAN OF CARE: Patient  PLAN FOR NEXT SESSION:  Per pt request, review full HEP next visit. Further address grasp/release. Coordination - e.g. pegs/pegboard   Wynetta Emery, OTR/L 08/28/2023, 11:29 AM   [facility] at [facility] [address] [place], [address] [place], [place] [address] Phone # [phone] Fax # [phone]

## 2023-08-29 DIAGNOSIS — L738 Other specified follicular disorders: Secondary | ICD-10-CM | POA: Diagnosis not present

## 2023-08-29 DIAGNOSIS — L728 Other follicular cysts of the skin and subcutaneous tissue: Secondary | ICD-10-CM | POA: Diagnosis not present

## 2023-08-31 ENCOUNTER — Other Ambulatory Visit: Payer: Self-pay | Admitting: Internal Medicine

## 2023-09-02 ENCOUNTER — Ambulatory Visit: Payer: Medicare Other | Admitting: Occupational Therapy

## 2023-09-02 DIAGNOSIS — M6281 Muscle weakness (generalized): Secondary | ICD-10-CM

## 2023-09-02 DIAGNOSIS — R278 Other lack of coordination: Secondary | ICD-10-CM

## 2023-09-02 DIAGNOSIS — G8193 Hemiplegia, unspecified affecting right nondominant side: Secondary | ICD-10-CM | POA: Diagnosis not present

## 2023-09-02 DIAGNOSIS — R208 Other disturbances of skin sensation: Secondary | ICD-10-CM | POA: Diagnosis not present

## 2023-09-02 NOTE — Therapy (Signed)
OUTPATIENT OCCUPATIONAL THERAPY NEURO  Treatment Note  Patient Name: [name] [name] MRN: [id number] DOB:1956/06/14, 67 y.o., male Today's Date: 09/02/2023  PCP: Tresa Garter, MD REFERRING PROVIDER: Glendale Chard, DO  END OF SESSION:  OT End of Session - 09/02/23 0945     Visit Number 7    Number of Visits 13    Date for OT Re-Evaluation 10/04/23    Authorization Type [employer] Medicare    OT Start Time 0932    OT Stop Time 1014    OT Time Calculation (min) 42 min    Activity Tolerance Patient tolerated treatment well    Behavior During Therapy [facility] for tasks assessed/performed                   Past Medical History:  Diagnosis Date   Benign localized prostatic hyperplasia with lower urinary tract symptoms (LUTS)    urologist--- dr Annabell Howells   Bladder cancer [facility])    urologist---- dr Annabell Howells---  s/p TURBT 11/2020; 03/ 2022   Coronary artery disease    mild   DDD (degenerative disc disease), cervical    From 1980s   Diabetes mellitus type II    followed by pcp--   (01-05-2021  per pt checks blood sugar three times per week,  fasting sugar --- 130--140_   Difficult intubation 07/28/2012   partial nephrectomy done @[facility]  07-28-2012  found to have anterior larynx , diffcult intubation, referred to anesthesia record in epic    Dyslipidemia    ED (erectile dysfunction)    Elevated PSA    GERD (gastroesophageal reflux disease)    History of adenomatous polyp of colon    History of kidney stones    History of prostatitis 2008   History of renal carcinoma urologist--- dr Annabell Howells   07-28-2012 for renal neoplasm  s/p right partal nephrectomy    Hypertension    followed by pcp   Osteoarthritis    Back   Renal carcinoma, right [facility])    Past Surgical History:  Procedure Laterality Date   CATARACT EXTRACTION     COLONOSCOPY N/A 03/03/2013   Procedure: COLONOSCOPY;  Surgeon: Hilarie Fredrickson, MD;  Location: [facility];  Service: Endoscopy;  Laterality: N/A;    COLONOSCOPY WITH PROPOFOL N/A 09/29/2019   Procedure: COLONOSCOPY WITH PROPOFOL;  Surgeon: Hilarie Fredrickson, MD;  Location: [facility];  Service: Endoscopy;  Laterality: N/A;   CYSTOSCOPY WITH BIOPSY N/A 12/09/2020   Procedure: CYSTOSCOPY WITH TURBT, BIOPSY AND FULGURATION;  Surgeon: Bjorn Pippin, MD;  Location: [facility];  Service: Urology;  Laterality: N/A;  REQUESTING 45 MINS   EXTRACORPOREAL SHOCK WAVE LITHOTRIPSY  01-10-2015;  03-03-2012  @[facility]    POLYPECTOMY  09/29/2019   Procedure: POLYPECTOMY;  Surgeon: Hilarie Fredrickson, MD;  Location: [facility];  Service: Endoscopy;;   ROBOT ASSISTED LAPAROSCOPIC PARTIAL NEPHRECTOMY  07-28-2012   DR Laverle Patter  @[facility]    TONSILLECTOMY  1965   TRANSURETHRAL RESECTION OF BLADDER TUMOR WITH MITOMYCIN-C N/A 01/06/2021   Procedure: TRANSURETHRAL RESECTION OF BLADDER TUMOR WITH GEMCITIBINE IN PACU;  Surgeon: Bjorn Pippin, MD;  Location: [facility];  Service: Urology;  Laterality: N/A;   TRANSURETHRAL RESECTION OF BLADDER TUMOR WITH MITOMYCIN-C N/A 09/01/2021   Procedure: TRANSURETHRAL RESECTION OF BLADDER TUMOR WITH POST OPERATIVE INSTILLATION OF GEMCITABINE;  Surgeon: Bjorn Pippin, MD;  Location: [facility];  Service: Urology;  Laterality: N/A;   TRANSURETHRAL RESECTION OF BLADDER TUMOR WITH MITOMYCIN-C Bilateral 11/13/2022   Procedure: CYSTOSCOPY TRANSURETHRAL  RESECTION OF BLADDER TUMOR BILATERAL RETROGRADES WITH INSTILL GEMCTABINE;  Surgeon: Bjorn Pippin, MD;  Location: [facility];  Service: Urology;  Laterality: Bilateral;  1 HR FOR CASE   Patient Active Problem List   Diagnosis Date Noted   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   Exposure to TB 08/26/2023   Cervical radiculitis 04/30/2023   Diabetic neuropathy (HCC) 04/23/2023   Intractable hiccups 02/05/2023   Upper abdominal pain 02/05/2023   Gastroesophageal reflux disease 02/05/2023   EKG abnormalities 02/05/2023   Scalp itch 01/23/2023   Tetrahydrocannabinol (THC) use disorder, moderate,  dependence (HCC) 01/23/2023   Polyneuropathy due to type 2 diabetes mellitus (HCC) 10/23/2022   Bladder cancer (HCC) 10/23/2022   Memory problem 07/17/2022   Leg pain, anterior, right 12/26/2021   Gross hematuria 09/16/2021   Statin declined 08/06/2021   Acute abscess of face 08/03/2021   Atherosclerosis of aorta (HCC) 04/26/2021   Coronary atherosclerosis 01/08/2021   Ascending aortic aneurysm (HCC) 01/08/2021   Bladder neck obstruction 10/20/2019   History of colonic polyps    Benign neoplasm of ascending colon    Benign neoplasm of transverse colon    Viral illness 06/16/2019   Exposure to COVID-19 virus 02/02/2019   Cerumen impaction 08/27/2018   Pilonidal cyst 01/18/2017   Neoplasm of uncertain behavior of skin 01/18/2017   Hypokalemia 12/11/2016   Tobacco use disorder 10/17/2016   Well adult exam 06/22/2016   Polycythemia, 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   Abscessed tooth 07/06/2015   Sebaceous cyst 07/06/2015   Common cold virus 12/23/2014   Infected sebaceous cyst 11/09/2014   Dyslipidemia 05/19/2013   URI, acute 09/15/2012   Strep throat 09/15/2012   Renal cell cancer (HCC) 09/02/2012   Adrenal mass (HCC) 06/03/2012   Nephrolithiasis 06/03/2012   Otitis media 01/08/2012   Dark urine 01/08/2012   Decreased vision in both eyes 01/08/2012   Onychomycosis 05/25/2011   DM type 2, controlled, with complication (HCC) 05/23/2010   Keloid scar 03/07/2010   SKIN RASH 03/07/2010   LACERATION, FINGER 09/15/2009   Osteoarthritis 08/15/2009   LOW BACK PAIN 08/15/2009   CELLULITIS AND ABSCESS OF BUTTOCK 03/29/2009   CERVICAL RADICULOPATHY 11/09/2008   WARTS, VIRAL, UNSPECIFIED 10/28/2007   Essential hypertension 06/30/2007   CLUSTER HEADACHE 06/27/2007   PSA, INCREASED 06/27/2007    ONSET DATE: June-July 2024, referral date 07/31/23  REFERRING DIAG: G54.5 (ICD-10-CM) - Neuralgic amyotrophy of right brachial plexus  THERAPY DIAG:  Hemiplegia affecting right nondominant side,  unspecified etiology, unspecified hemiplegia type (HCC)  Other lack of coordination  Other disturbances of skin sensation  Muscle weakness (generalized)  Rationale for Evaluation and Treatment: Rehabilitation  SUBJECTIVE:   SUBJECTIVE STATEMENT: Pt reports "when I'm doing things, I don't notice it" but sometimes when doing smaller items, "I have to scoop them up".  "I'll be honest, I haven't looked at the exercises in the folder since I was here last".    Pt accompanied by: self  PERTINENT HISTORY: diabetes mellitus, hyperlipidemia, hypertension, bladder cancer, CAD, right renal cancer, and GERD presenting for evaluation of right hand weakness.   PRECAUTIONS: None  WEIGHT BEARING RESTRICTIONS: No  PAIN:  Are you having pain? No  FALLS: Has patient fallen in last 6 months? No, "had some close calls"  LIVING ENVIRONMENT: Lives with: lives with their family (spouse and 81 yo grandson) Lives in: House/apartment Stairs: Yes: External: ramp to back entrance, front entrance has 5-6 steps Has following equipment at home: Ramped entry, comfort height toilet  PLOF: Independent and Independent  with basic ADLs  PATIENT GOALS: to be able to open/close my hand and use fingers  OBJECTIVE:  Note: Objective measures were completed at Evaluation unless otherwise noted.  HAND DOMINANCE: Ambidextrous, writes and eat with L hand, bats/throws with R hand  ADLs: Overall ADLs: "I've just had to learn how to do things differently" Transfers/ambulation related to ADLs: Independent Eating: Mod I Grooming: Mod I UB Dressing: difficulty finding arm hole on long sleeve shirts LB Dressing: difficulty with getting zipper down on jeans Toileting: Independent Bathing: Independent Tub Shower transfers: Independent Equipment: none  IADLs: Light housekeeping: Mod I Meal Prep: Mod I,  "I've just had to learn how to do things differently" utilizing 2 hand chop when making BBQ Community mobility:  driving Medication management: increased time  MOBILITY STATUS: Independent  POSTURE COMMENTS:  No Significant postural limitations  ACTIVITY TOLERANCE: Activity tolerance: WFL for tasks assessed  FUNCTIONAL OUTCOME MEASURES: Quick Dash: 13.63 impairment The Brachial Assessment Tool: 77/93 Subscale 1: dressing items: 20/24 Subscale 2: arm and hand items: 42/51 Subscale 3: no hand items 15/18  UPPER EXTREMITY ROM:  decreased wrist extension against gravity, decresaed finger extension long, ring, and small finger  Active ROM Right eval Left eval  Shoulder flexion [facility]  Shoulder abduction [facility]  Shoulder adduction    Shoulder extension    Shoulder internal rotation [facility]  Shoulder external rotation [facility]  Elbow flexion [facility] WFL  Elbow extension [facility] WFL  Wrist flexion [facility] WFL  Wrist extension [facility] WFL  Wrist ulnar deviation    Wrist radial deviation    Wrist pronation WFL WFL  Wrist supination WFL WFL  (Blank rows = not tested)  UPPER EXTREMITY MMT:     MMT Right eval Left eval  Shoulder flexion 5/5 5/5  Shoulder abduction 5/5 5/5  Shoulder adduction    Shoulder extension    Shoulder internal rotation 5/5 5/5  Shoulder external rotation 5/5 5/5  Middle trapezius    Lower trapezius    Elbow flexion 5/5 5/5  Elbow extension 5/5 5/5  Wrist flexion 5/5 5/5  Wrist extension 4/5 5/5  Wrist ulnar deviation    Wrist radial deviation    Wrist pronation    Wrist supination    (Blank rows = not tested)  RUE: Finger extensors 1/5  Finger flexors 4/5   Dorsal interossei 4/5   Abductor pollicis 4/5    HAND FUNCTION: Grip strength: Right: 68 lbs; Left: 21 lbs  COORDINATION: Box and Blocks:  Right 38 blocks, Left 50 blocks  SENSATION: Reporting tingling  EDEMA: NA  COGNITION: Overall cognitive status: Within functional limits for tasks assessed  OBSERVATIONS: Pt demonstrating decreased wrist extension against gravity during box and blocks  assessment.  Pt able to complete thumb opposition, however with flat fingers as opposed to rounded "O" shape.  Pt with difficulty with finger abduction and extension from table top.   TODAY'S TREATMENT:                                                                                 DATE:  09/02/23 Coordination: engaged in picking up large grip pegs and rotating in fingers with focus  on pinch grip and motor control.  Pt challenging self to complete 2nd rotation, noting increased difficulty and need for use of gravity to aid in rotation.  Pt then removing pegs from resistance peg board with good pinch strength and then rotating half turn with good motor control.  TherEx: Engaged in Ulnar nerve glides and mobilization - focus on slow and controlled movement.  OT providing cues and demonstration with HEP with focus on AROM, positioning, and pain threshold.  Pt with tendency to over stretch at 4-5 digits with elbow and wrist extension, increasing pain in wrist and forearm - educated on gentle stretch with guidance and not "cranking" back.  OT adding additional written instructions to facilitate carryover and sequencing of gentle movement.   OT identified 4 most important exercises to focus on mobility and gentle stretch as pt reports that he is not performing HEP at frequency as instructed.  Coordination: picking up and rotating Connect 4 pieces and then stacking/unstacking pieces.  Pt with good manipulation when rotating between thumb and first 2 fingers. OT then challenged pt to complete in-hand manipulation with translation of connect 4 pieces from palm to finger tips to place in to slot.  Pt continues to utilize gravity to aid in translation of pieces from palm to finger tips.     PATIENT EDUCATION: Education details: ongoing condition specific education Person educated: Patient Education method: Explanation, Demonstration, and Verbal cues Education comprehension: verbalized understanding and needs  further education  HOME EXERCISE PROGRAM: Access Code: [email or web address] URL: [email or web address] Date: 08/12/2023 Prepared by: [facility] - Outpatient  Rehab - [facility]  Exercises - Wrist Flexion Extension AROM with Fingers Curled and Palm Down  - 3 x daily - 5 reps - Thumb Opposition  - 3 x daily - 5 reps - Wrist Prayer Stretch  - 3 x daily - 5 reps - Ulnar Nerve/Median Glide- Low Level  - 3 x daily - 5 reps - Ulnar Nerve Mobilization - Low Level  - 3 x daily - 5 reps - Wrist Radial Ulnar Deviation AROM  - 3 x daily - 5 reps - Finger walking  - 3 x daily - 5 reps  08/28/23 - Palm-to-finger translation and return of coins, marbles, small food items, checkers, etc. To target FM coordination.   GOALS: Goals reviewed with patient? Yes  SHORT TERM GOALS: Target date: 09/06/23  Pt will be independent in ROM and strengthening HEP. Baseline: Goal status: IN PROGRESS  2.  Pt will verbalize understanding of adaptive strategies, task modifications and/or potential AE needs to increase ease, safety, and independence w/ ADLs and IADLs. Baseline:  Goal status: IN PROGRESS  3.  Pt will demonstrate improved UE functional use for ADLs as evidenced by increasing box/ blocks score by 4 blocks with RUE Baseline: Right 38 blocks, Left 50 blocks Goal status: IN PROGRESS   LONG TERM GOALS: Target date: 10/04/23  Pt will demonstrate ability to pick up small objects from table top (pen, coin, etc) x5 with RUE mild increased time and no drops. Baseline: very hard to pick up small items Goal status: IN PROGRESS  2.  Pt will verbalize and/or demonstrate improved ability to type/text on phone without increased pain >2/10 on pain scale. Baseline:  Goal status: IN PROGRESS  3.  Pt will demonstrate ability to utilize BUE together without any pain or difficulty to engage in simple meal prep tasks (buttering bread, using knife/fork to cut foods, scooping foods, etc).  Baseline:  Goal  status:  IN PROGRESS  4.  Pt will demonstrate improved grip strength by 10# to allow for increased ease with use of tools and making the bed. Baseline: L: 68#, R: 21# Goal status: IN PROGRESS  5.  Pt will report improved functional use of RUE during ADLs/IADLs as evidenced by improved score on BrAT by 5 points. Baseline: 77/93 Goal status: IN PROGRESS  ASSESSMENT:  CLINICAL IMPRESSION: Pt tolerated tasks well today. Pt continues to benefit from education of HEP with focus on slow and steady with movements.  Pt also benefits from gravity to aid in in-hand manipulation to compensate for decreased finger mobility and coordination.  PERFORMANCE DEFICITS: in functional skills including ADLs, IADLs, coordination, dexterity, sensation, ROM, strength, pain, Fine motor control, Gross motor control, decreased knowledge of precautions, decreased knowledge of use of DME, and UE functional use and psychosocial skills including coping strategies and environmental adaptation.    PLAN:  OT FREQUENCY: 2x/week (2x/week for 4 weeks and then decrease to 1x/week)  OT DURATION: 8 weeks  PLANNED INTERVENTIONS: 97168 OT Re-evaluation, 97535 self care/ADL training, 47829 therapeutic exercise, 97530 therapeutic activity, 97112 neuromuscular re-education, 97140 manual therapy, 97035 ultrasound, 97010 moist heat, 97010 cryotherapy, 97032 electrical stimulation (manual), passive range of motion, coping strategies training, patient/family education, and DME and/or AE instructions  RECOMMENDED OTHER SERVICES: NA  CONSULTED AND AGREED WITH PLAN OF CARE: Patient  PLAN FOR NEXT SESSION:  Per pt request, review full HEP next visit. Further address grasp/release. Coordination - e.g. pegs/pegboard   Rosalio Loud, OTR/L 09/02/2023, 9:45 AM   [facility] [address] [place], [address] [place], [place] [address] Phone # [phone] Fax # [phone]

## 2023-09-03 ENCOUNTER — Other Ambulatory Visit: Payer: Self-pay | Admitting: Internal Medicine

## 2023-09-04 ENCOUNTER — Ambulatory Visit: Payer: Medicare Other | Admitting: Occupational Therapy

## 2023-09-04 DIAGNOSIS — R278 Other lack of coordination: Secondary | ICD-10-CM | POA: Diagnosis not present

## 2023-09-04 DIAGNOSIS — R208 Other disturbances of skin sensation: Secondary | ICD-10-CM | POA: Diagnosis not present

## 2023-09-04 DIAGNOSIS — M6281 Muscle weakness (generalized): Secondary | ICD-10-CM

## 2023-09-04 DIAGNOSIS — G8193 Hemiplegia, unspecified affecting right nondominant side: Secondary | ICD-10-CM

## 2023-09-04 NOTE — Therapy (Signed)
OUTPATIENT OCCUPATIONAL THERAPY NEURO  Treatment Note  Patient Name: [name] [name] MRN: [id number] DOB:01-27-1956, 67 y.o., male Today's Date: 09/04/2023  PCP: Tresa Garter, MD REFERRING PROVIDER: Glendale Chard, DO  END OF SESSION:  OT End of Session - 09/04/23 1213     Visit Number 8    Number of Visits 13    Date for OT Re-Evaluation 10/04/23    Authorization Type [employer] Medicare    OT Start Time 0936   Pt arrived late for OT session   OT Stop Time 1014    OT Time Calculation (min) 38 min    Activity Tolerance Patient tolerated treatment well    Behavior During Therapy [facility] for tasks assessed/performed                    Past Medical History:  Diagnosis Date   Benign localized prostatic hyperplasia with lower urinary tract symptoms (LUTS)    urologist--- dr Annabell Howells   Bladder cancer [facility])    urologist---- dr Annabell Howells---  s/p TURBT 11/2020; 03/ 2022   Coronary artery disease    mild   DDD (degenerative disc disease), cervical    From 1980s   Diabetes mellitus type II    followed by pcp--   (01-05-2021  per pt checks blood sugar three times per week,  fasting sugar --- 130--140_   Difficult intubation 07/28/2012   partial nephrectomy done @[facility]  07-28-2012  found to have anterior larynx , diffcult intubation, referred to anesthesia record in epic    Dyslipidemia    ED (erectile dysfunction)    Elevated PSA    GERD (gastroesophageal reflux disease)    History of adenomatous polyp of colon    History of kidney stones    History of prostatitis 2008   History of renal carcinoma urologist--- dr Annabell Howells   07-28-2012 for renal neoplasm  s/p right partal nephrectomy    Hypertension    followed by pcp   Osteoarthritis    Back   Renal carcinoma, right [facility])    Past Surgical History:  Procedure Laterality Date   CATARACT EXTRACTION     COLONOSCOPY N/A 03/03/2013   Procedure: COLONOSCOPY;  Surgeon: Hilarie Fredrickson, MD;  Location: [facility];  Service:  Endoscopy;  Laterality: N/A;   COLONOSCOPY WITH PROPOFOL N/A 09/29/2019   Procedure: COLONOSCOPY WITH PROPOFOL;  Surgeon: Hilarie Fredrickson, MD;  Location: [facility];  Service: Endoscopy;  Laterality: N/A;   CYSTOSCOPY WITH BIOPSY N/A 12/09/2020   Procedure: CYSTOSCOPY WITH TURBT, BIOPSY AND FULGURATION;  Surgeon: Bjorn Pippin, MD;  Location: [facility];  Service: Urology;  Laterality: N/A;  REQUESTING 45 MINS   EXTRACORPOREAL SHOCK WAVE LITHOTRIPSY  01-10-2015;  03-03-2012  @[facility]    POLYPECTOMY  09/29/2019   Procedure: POLYPECTOMY;  Surgeon: Hilarie Fredrickson, MD;  Location: [facility];  Service: Endoscopy;;   ROBOT ASSISTED LAPAROSCOPIC PARTIAL NEPHRECTOMY  07-28-2012   DR Laverle Patter  @[facility]    TONSILLECTOMY  1965   TRANSURETHRAL RESECTION OF BLADDER TUMOR WITH MITOMYCIN-C N/A 01/06/2021   Procedure: TRANSURETHRAL RESECTION OF BLADDER TUMOR WITH GEMCITIBINE IN PACU;  Surgeon: Bjorn Pippin, MD;  Location: [facility];  Service: Urology;  Laterality: N/A;   TRANSURETHRAL RESECTION OF BLADDER TUMOR WITH MITOMYCIN-C N/A 09/01/2021   Procedure: TRANSURETHRAL RESECTION OF BLADDER TUMOR WITH POST OPERATIVE INSTILLATION OF GEMCITABINE;  Surgeon: Bjorn Pippin, MD;  Location: [facility];  Service: Urology;  Laterality: N/A;   TRANSURETHRAL RESECTION OF BLADDER TUMOR WITH  MITOMYCIN-C Bilateral 11/13/2022   Procedure: CYSTOSCOPY TRANSURETHRAL RESECTION OF BLADDER TUMOR BILATERAL RETROGRADES WITH INSTILL GEMCTABINE;  Surgeon: Bjorn Pippin, MD;  Location: [facility];  Service: Urology;  Laterality: Bilateral;  1 HR FOR CASE   Patient Active Problem List   Diagnosis Date Noted   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   Exposure to TB 08/26/2023   Cervical radiculitis 04/30/2023   Diabetic neuropathy (HCC) 04/23/2023   Intractable hiccups 02/05/2023   Upper abdominal pain 02/05/2023   Gastroesophageal reflux disease 02/05/2023   EKG abnormalities 02/05/2023   Scalp itch 01/23/2023   Tetrahydrocannabinol  (THC) use disorder, moderate, dependence (HCC) 01/23/2023   Polyneuropathy due to type 2 diabetes mellitus (HCC) 10/23/2022   Bladder cancer (HCC) 10/23/2022   Memory problem 07/17/2022   Leg pain, anterior, right 12/26/2021   Gross hematuria 09/16/2021   Statin declined 08/06/2021   Acute abscess of face 08/03/2021   Atherosclerosis of aorta (HCC) 04/26/2021   Coronary atherosclerosis 01/08/2021   Ascending aortic aneurysm (HCC) 01/08/2021   Bladder neck obstruction 10/20/2019   History of colonic polyps    Benign neoplasm of ascending colon    Benign neoplasm of transverse colon    Viral illness 06/16/2019   Exposure to COVID-19 virus 02/02/2019   Cerumen impaction 08/27/2018   Pilonidal cyst 01/18/2017   Neoplasm of uncertain behavior of skin 01/18/2017   Hypokalemia 12/11/2016   Tobacco use disorder 10/17/2016   Well adult exam 06/22/2016   Polycythemia, 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   Abscessed tooth 07/06/2015   Sebaceous cyst 07/06/2015   Common cold virus 12/23/2014   Infected sebaceous cyst 11/09/2014   Dyslipidemia 05/19/2013   URI, acute 09/15/2012   Strep throat 09/15/2012   Renal cell cancer (HCC) 09/02/2012   Adrenal mass (HCC) 06/03/2012   Nephrolithiasis 06/03/2012   Otitis media 01/08/2012   Dark urine 01/08/2012   Decreased vision in both eyes 01/08/2012   Onychomycosis 05/25/2011   DM type 2, controlled, with complication (HCC) 05/23/2010   Keloid scar 03/07/2010   SKIN RASH 03/07/2010   LACERATION, FINGER 09/15/2009   Osteoarthritis 08/15/2009   LOW BACK PAIN 08/15/2009   CELLULITIS AND ABSCESS OF BUTTOCK 03/29/2009   CERVICAL RADICULOPATHY 11/09/2008   WARTS, VIRAL, UNSPECIFIED 10/28/2007   Essential hypertension 06/30/2007   CLUSTER HEADACHE 06/27/2007   PSA, INCREASED 06/27/2007    ONSET DATE: June-July 2024, referral date 07/31/23  REFERRING DIAG: G54.5 (ICD-10-CM) - Neuralgic amyotrophy of right brachial plexus  THERAPY DIAG:  Other lack of  coordination  Hemiplegia affecting right nondominant side, unspecified etiology, unspecified hemiplegia type (HCC)  Other disturbances of skin sensation  Muscle weakness (generalized)  Rationale for Evaluation and Treatment: Rehabilitation  SUBJECTIVE:   SUBJECTIVE STATEMENT: Pt reports not feeling like doing exercises today because R hand hurts and pt feels more tired today. Pt reports R hand feels "less loose" than usual and reports increased soreness and fatigue of R hand. Pt reports not really following HEP and instead "coming up with my own exercises." Pt reported improved coordination for daily tasks in general.  Pt accompanied by: self  PERTINENT HISTORY: diabetes mellitus, hyperlipidemia, hypertension, bladder cancer, CAD, right renal cancer, and GERD presenting for evaluation of right hand weakness.   PRECAUTIONS: None  WEIGHT BEARING RESTRICTIONS: No  PAIN:  Are you having pain? Yes: NPRS scale: 3/10 Pain location: R palm Pain description: sore, aching, tight Aggravating factors: possibly more exercises at night Relieving factors: run under hot water  FALLS: Has patient fallen in last 6  months? No, "had some close calls"  LIVING ENVIRONMENT: Lives with: lives with their family (spouse and 58 yo grandson) Lives in: House/apartment Stairs: Yes: External: ramp to back entrance, front entrance has 5-6 steps Has following equipment at home: Ramped entry, comfort height toilet  PLOF: Independent and Independent with basic ADLs  PATIENT GOALS: to be able to open/close my hand and use fingers  OBJECTIVE:  Note: Objective measures were completed at Evaluation unless otherwise noted.  HAND DOMINANCE: Ambidextrous, writes and eat with L hand, bats/throws with R hand  ADLs: Overall ADLs: "I've just had to learn how to do things differently" Transfers/ambulation related to ADLs: Independent Eating: Mod I Grooming: Mod I UB Dressing: difficulty finding arm hole on long  sleeve shirts LB Dressing: difficulty with getting zipper down on jeans Toileting: Independent Bathing: Independent Tub Shower transfers: Independent Equipment: none  IADLs: Light housekeeping: Mod I Meal Prep: Mod I,  "I've just had to learn how to do things differently" utilizing 2 hand chop when making BBQ Community mobility: driving Medication management: increased time  MOBILITY STATUS: Independent  POSTURE COMMENTS:  No Significant postural limitations  ACTIVITY TOLERANCE: Activity tolerance: WFL for tasks assessed  FUNCTIONAL OUTCOME MEASURES: Quick Dash: 13.63 impairment The Brachial Assessment Tool: 77/93 Subscale 1: dressing items: 20/24 Subscale 2: arm and hand items: 42/51 Subscale 3: no hand items 15/18  UPPER EXTREMITY ROM:  decreased wrist extension against gravity, decresaed finger extension long, ring, and small finger  Active ROM Right eval Left eval  Shoulder flexion [facility]  Shoulder abduction [facility]  Shoulder adduction    Shoulder extension    Shoulder internal rotation [facility]  Shoulder external rotation [facility]  Elbow flexion [facility] WFL  Elbow extension [facility] WFL  Wrist flexion [facility] WFL  Wrist extension [facility] WFL  Wrist ulnar deviation    Wrist radial deviation    Wrist pronation WFL WFL  Wrist supination WFL WFL  (Blank rows = not tested)  UPPER EXTREMITY MMT:     MMT Right eval Left eval  Shoulder flexion 5/5 5/5  Shoulder abduction 5/5 5/5  Shoulder adduction    Shoulder extension    Shoulder internal rotation 5/5 5/5  Shoulder external rotation 5/5 5/5  Middle trapezius    Lower trapezius    Elbow flexion 5/5 5/5  Elbow extension 5/5 5/5  Wrist flexion 5/5 5/5  Wrist extension 4/5 5/5  Wrist ulnar deviation    Wrist radial deviation    Wrist pronation    Wrist supination    (Blank rows = not tested)  RUE: Finger extensors 1/5  Finger flexors 4/5   Dorsal interossei 4/5   Abductor pollicis 4/5    HAND FUNCTION: Grip  strength: Right: 68 lbs; Left: 21 lbs  COORDINATION: Box and Blocks:  Right 38 blocks, Left 50 blocks  SENSATION: Reporting tingling  EDEMA: NA  COGNITION: Overall cognitive status: Within functional limits for tasks assessed  OBSERVATIONS: Pt demonstrating decreased wrist extension against gravity during box and blocks assessment.  Pt able to complete thumb opposition, however with flat fingers as opposed to rounded "O" shape.  Pt with difficulty with finger abduction and extension from table top.   TODAY'S TREATMENT:  DATE:  09/04/23 TherAct:  Moist heat while completing R digit ABD/ADD - 5 minutes timed - Pt reports R hand feeling looser after moist heat. OT educated pt on heat home program, handout provided. Pt verbalized understanding. Assessed progress towards STG goals and 1 LTG, see below.  Replacing and removing large pegs with pegboard - to improve  RUE FM coordination and dexterity. Pt's RUE dexterity and FM coordination improved as task progressed as evidenced by increased speed and efficiency with RUE movements. Grasp and release of large blocks with large shoulder arcs to stack/unstack tower - to improve RUE FM coordination, dexterity, proprioception and grasp/release.  Stacking Jenga tower blocks - Trial 1: Alternating vertical and horizontal rows. Trial 2: 3-block "bridges" (stacked towers of x3 "bridges") - to improve RUE FM coordination, dexterity, proprioception and grasp/release.  Pt reported R hand feels "looser" after OT session tasks today.   PATIENT EDUCATION: Education details: ongoing condition specific education, HEP completion, moving RUE during daily tasks, moist heat (handout), purpose of moist heat and HEP Person educated: Patient Education method: Explanation, Demonstration, and Verbal cues Education comprehension: verbalized understanding and needs further education  HOME  EXERCISE PROGRAM: Access Code: [email or web address] URL: [email or web address] Date: 08/12/2023 Prepared by: [facility]  Exercises - Wrist Flexion Extension AROM with Fingers Curled and Palm Down  - 3 x daily - 5 reps - Thumb Opposition  - 3 x daily - 5 reps - Wrist Prayer Stretch  - 3 x daily - 5 reps - Ulnar Nerve/Median Glide- Low Level  - 3 x daily - 5 reps - Ulnar Nerve Mobilization - Low Level  - 3 x daily - 5 reps - Wrist Radial Ulnar Deviation AROM  - 3 x daily - 5 reps - Finger walking  - 3 x daily - 5 reps  08/28/23 - Palm-to-finger translation and return of coins, marbles, small food items, checkers, etc. To target FM coordination.  09/04/23 - heat home program (handout, see pt instructions)   GOALS: Goals reviewed with patient? Yes  SHORT TERM GOALS: Target date: 09/06/23  Pt will be independent in ROM and strengthening HEP. Baseline: 09/04/23 - Inconsistent completion, continues to benefit from v/c for positioning. Goal status: IN PROGRESS  2.  Pt will verbalize understanding of adaptive strategies, task modifications and/or potential AE needs to increase ease, safety, and independence w/ ADLs and IADLs. Baseline:  09/04/23 - Pt reports rolling cylinder for RUE ROM and strumming on guitar has improved and has been going well. Goal status: IN PROGRESS  3.  Pt will demonstrate improved UE functional use for ADLs as evidenced by increasing box/ blocks score by 4 blocks with RUE Baseline: Right 38 blocks, Left 50 blocks 09/04/23 - Right - 37 blocks Goal status: IN PROGRESS   LONG TERM GOALS: Target date: 10/04/23  Pt will demonstrate ability to pick up small objects from table top (pen, coin, etc) x5 with RUE mild increased time and no drops. Baseline: very hard to pick up small items Goal status: IN PROGRESS  2.  Pt will verbalize and/or demonstrate improved ability to type/text on phone without increased pain >2/10 on  pain scale. Baseline:  Goal status: IN PROGRESS  3.  Pt will demonstrate ability to utilize BUE together without any pain or difficulty to engage in simple meal prep tasks (buttering bread, using knife/fork to cut foods, scooping foods, etc).  Baseline:  Goal status: IN PROGRESS  4.  Pt will demonstrate improved  grip strength by 10# to allow for increased ease with use of tools and making the bed. Baseline: L: 68#, R: 21# 09/04/23 - R: 30# Goal status: IN PROGRESS  5.  Pt will report improved functional use of RUE during ADLs/IADLs as evidenced by improved score on BrAT by 5 points. Baseline: 77/93 Goal status: IN PROGRESS  ASSESSMENT:  CLINICAL IMPRESSION: Pt tolerated tasks fairly well today. Pt demo'd improved efficiency of RUE movements as OT session progressed. Pt continues to progress towards goals as evidenced by increased strength, maintaining coordination, and improved understanding of using RUE during daily tasks. Pt continues to benefit from education of HEP with focus on slow and steady with movements. Pt would benefit from skilled OT services to address deficits and increase overall independence.  PERFORMANCE DEFICITS: in functional skills including ADLs, IADLs, coordination, dexterity, sensation, ROM, strength, pain, Fine motor control, Gross motor control, decreased knowledge of precautions, decreased knowledge of use of DME, and UE functional use and psychosocial skills including coping strategies and environmental adaptation.    PLAN:  OT FREQUENCY: 2x/week (2x/week for 4 weeks and then decrease to 1x/week)  OT DURATION: 8 weeks  PLANNED INTERVENTIONS: 97168 OT Re-evaluation, 97535 self care/ADL training, 35573 therapeutic exercise, 97530 therapeutic activity, 97112 neuromuscular re-education, 97140 manual therapy, 97035 ultrasound, 97010 moist heat, 97010 cryotherapy, 97032 electrical stimulation (manual), passive range of motion, coping strategies training,  patient/family education, and DME and/or AE instructions  RECOMMENDED OTHER SERVICES: NA  CONSULTED AND AGREED WITH PLAN OF CARE: Patient  PLAN FOR NEXT SESSION:  Review HEP PRN Continue to address grasp/release and RUE strengthening Coordination - e.g. pegs/pegboard   Wynetta Emery, OTR/L 09/04/2023, 12:20 PM   [facility] [address], [address] [place], [place] [address] Phone # [phone] Fax # [phone]

## 2023-09-09 ENCOUNTER — Ambulatory Visit: Payer: Medicare Other | Admitting: Occupational Therapy

## 2023-09-09 DIAGNOSIS — R278 Other lack of coordination: Secondary | ICD-10-CM

## 2023-09-09 DIAGNOSIS — G8193 Hemiplegia, unspecified affecting right nondominant side: Secondary | ICD-10-CM

## 2023-09-09 DIAGNOSIS — M6281 Muscle weakness (generalized): Secondary | ICD-10-CM | POA: Diagnosis not present

## 2023-09-09 DIAGNOSIS — R208 Other disturbances of skin sensation: Secondary | ICD-10-CM | POA: Diagnosis not present

## 2023-09-09 NOTE — Therapy (Signed)
OUTPATIENT OCCUPATIONAL THERAPY NEURO  Treatment Note  Patient Name: [name] [name] MRN: [id number] DOB:11-Oct-1956, 67 y.o., male Today's Date: 09/09/2023  PCP: Tresa Garter, MD REFERRING PROVIDER: Glendale Chard, DO  END OF SESSION:  OT End of Session - 09/09/23 0935     Visit Number 9    Number of Visits 13    Date for OT Re-Evaluation 10/04/23    Authorization Type [employer] Medicare    OT Start Time 0930    OT Stop Time 1012    OT Time Calculation (min) 42 min    Activity Tolerance Patient tolerated treatment well    Behavior During Therapy [facility] for tasks assessed/performed                     Past Medical History:  Diagnosis Date   Benign localized prostatic hyperplasia with lower urinary tract symptoms (LUTS)    urologist--- dr Annabell Howells   Bladder cancer [facility])    urologist---- dr Annabell Howells---  s/p TURBT 11/2020; 03/ 2022   Coronary artery disease    mild   DDD (degenerative disc disease), cervical    From 1980s   Diabetes mellitus type II    followed by pcp--   (01-05-2021  per pt checks blood sugar three times per week,  fasting sugar --- 130--140_   Difficult intubation 07/28/2012   partial nephrectomy done @[facility]  07-28-2012  found to have anterior larynx , diffcult intubation, referred to anesthesia record in epic    Dyslipidemia    ED (erectile dysfunction)    Elevated PSA    GERD (gastroesophageal reflux disease)    History of adenomatous polyp of colon    History of kidney stones    History of prostatitis 2008   History of renal carcinoma urologist--- dr Annabell Howells   07-28-2012 for renal neoplasm  s/p right partal nephrectomy    Hypertension    followed by pcp   Osteoarthritis    Back   Renal carcinoma, right [facility])    Past Surgical History:  Procedure Laterality Date   CATARACT EXTRACTION     COLONOSCOPY N/A 03/03/2013   Procedure: COLONOSCOPY;  Surgeon: Hilarie Fredrickson, MD;  Location: [facility];  Service: Endoscopy;  Laterality: N/A;    COLONOSCOPY WITH PROPOFOL N/A 09/29/2019   Procedure: COLONOSCOPY WITH PROPOFOL;  Surgeon: Hilarie Fredrickson, MD;  Location: [facility];  Service: Endoscopy;  Laterality: N/A;   CYSTOSCOPY WITH BIOPSY N/A 12/09/2020   Procedure: CYSTOSCOPY WITH TURBT, BIOPSY AND FULGURATION;  Surgeon: Bjorn Pippin, MD;  Location: [facility];  Service: Urology;  Laterality: N/A;  REQUESTING 45 MINS   EXTRACORPOREAL SHOCK WAVE LITHOTRIPSY  01-10-2015;  03-03-2012  @[facility]    POLYPECTOMY  09/29/2019   Procedure: POLYPECTOMY;  Surgeon: Hilarie Fredrickson, MD;  Location: [facility];  Service: Endoscopy;;   ROBOT ASSISTED LAPAROSCOPIC PARTIAL NEPHRECTOMY  07-28-2012   DR Laverle Patter  @[facility]    TONSILLECTOMY  1965   TRANSURETHRAL RESECTION OF BLADDER TUMOR WITH MITOMYCIN-C N/A 01/06/2021   Procedure: TRANSURETHRAL RESECTION OF BLADDER TUMOR WITH GEMCITIBINE IN PACU;  Surgeon: Bjorn Pippin, MD;  Location: [facility];  Service: Urology;  Laterality: N/A;   TRANSURETHRAL RESECTION OF BLADDER TUMOR WITH MITOMYCIN-C N/A 09/01/2021   Procedure: TRANSURETHRAL RESECTION OF BLADDER TUMOR WITH POST OPERATIVE INSTILLATION OF GEMCITABINE;  Surgeon: Bjorn Pippin, MD;  Location[facility];  Service: Urology;  Laterality: N/A;   TRANSURETHRAL RESECTION OF BLADDER TUMOR WITH MITOMYCIN-C Bilateral 11/13/2022   Procedure:  CYSTOSCOPY TRANSURETHRAL RESECTION OF BLADDER TUMOR BILATERAL RETROGRADES WITH INSTILL GEMCTABINE;  Surgeon: Bjorn Pippin, MD;  Location: [facility];  Service: Urology;  Laterality: Bilateral;  1 HR FOR CASE   Patient Active Problem List   Diagnosis Date Noted   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   Exposure to TB 08/26/2023   Cervical radiculitis 04/30/2023   Diabetic neuropathy (HCC) 04/23/2023   Intractable hiccups 02/05/2023   Upper abdominal pain 02/05/2023   Gastroesophageal reflux disease 02/05/2023   EKG abnormalities 02/05/2023   Scalp itch 01/23/2023   Tetrahydrocannabinol (THC) use disorder, moderate,  dependence (HCC) 01/23/2023   Polyneuropathy due to type 2 diabetes mellitus (HCC) 10/23/2022   Bladder cancer (HCC) 10/23/2022   Memory problem 07/17/2022   Leg pain, anterior, right 12/26/2021   Gross hematuria 09/16/2021   Statin declined 08/06/2021   Acute abscess of face 08/03/2021   Atherosclerosis of aorta (HCC) 04/26/2021   Coronary atherosclerosis 01/08/2021   Ascending aortic aneurysm (HCC) 01/08/2021   Bladder neck obstruction 10/20/2019   History of colonic polyps    Benign neoplasm of ascending colon    Benign neoplasm of transverse colon    Viral illness 06/16/2019   Exposure to COVID-19 virus 02/02/2019   Cerumen impaction 08/27/2018   Pilonidal cyst 01/18/2017   Neoplasm of uncertain behavior of skin 01/18/2017   Hypokalemia 12/11/2016   Tobacco use disorder 10/17/2016   Well adult exam 06/22/2016   Polycythemia, 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   Abscessed tooth 07/06/2015   Sebaceous cyst 07/06/2015   Common cold virus 12/23/2014   Infected sebaceous cyst 11/09/2014   Dyslipidemia 05/19/2013   URI, acute 09/15/2012   Strep throat 09/15/2012   Renal cell cancer (HCC) 09/02/2012   Adrenal mass (HCC) 06/03/2012   Nephrolithiasis 06/03/2012   Otitis media 01/08/2012   Dark urine 01/08/2012   Decreased vision in both eyes 01/08/2012   Onychomycosis 05/25/2011   DM type 2, controlled, with complication (HCC) 05/23/2010   Keloid scar 03/07/2010   SKIN RASH 03/07/2010   LACERATION, FINGER 09/15/2009   Osteoarthritis 08/15/2009   LOW BACK PAIN 08/15/2009   CELLULITIS AND ABSCESS OF BUTTOCK 03/29/2009   CERVICAL RADICULOPATHY 11/09/2008   WARTS, VIRAL, UNSPECIFIED 10/28/2007   Essential hypertension 06/30/2007   CLUSTER HEADACHE 06/27/2007   PSA, INCREASED 06/27/2007    ONSET DATE: June-July 2024, referral date 07/31/23  REFERRING DIAG: G54.5 (ICD-10-CM) - Neuralgic amyotrophy of right brachial plexus  THERAPY DIAG:  Hemiplegia affecting right nondominant side,  unspecified etiology, unspecified hemiplegia type (HCC)  Other lack of coordination  Other disturbances of skin sensation  Muscle weakness (generalized)  Rationale for Evaluation and Treatment: Rehabilitation  SUBJECTIVE:   SUBJECTIVE STATEMENT: Pt reports having to rearrange his upcoming schedules due to upcoming yard sales that he will be completing in the mornings at his home.    Pt reports exercising his thumb most as he feels it is the most stiff and that he cannot move fingers and thumb at the same time.    Pt accompanied by: self  PERTINENT HISTORY: diabetes mellitus, hyperlipidemia, hypertension, bladder cancer, CAD, right renal cancer, and GERD presenting for evaluation of right hand weakness.   PRECAUTIONS: None  WEIGHT BEARING RESTRICTIONS: No  PAIN:  Are you having pain? No  FALLS: Has patient fallen in last 6 months? No, "had some close calls"  LIVING ENVIRONMENT: Lives with: lives with their family (spouse and 74 yo grandson) Lives in: House/apartment Stairs: Yes: External: ramp to back entrance, front entrance has 5-6 steps  Has following equipment at home: Ramped entry, comfort height toilet  PLOF: Independent and Independent with basic ADLs  PATIENT GOALS: to be able to open/close my hand and use fingers  OBJECTIVE:  Note: Objective measures were completed at Evaluation unless otherwise noted.  HAND DOMINANCE: Ambidextrous, writes and eat with L hand, bats/throws with R hand  ADLs: Overall ADLs: "I've just had to learn how to do things differently" Transfers/ambulation related to ADLs: Independent Eating: Mod I Grooming: Mod I UB Dressing: difficulty finding arm hole on long sleeve shirts LB Dressing: difficulty with getting zipper down on jeans Toileting: Independent Bathing: Independent Tub Shower transfers: Independent Equipment: none  IADLs: Light housekeeping: Mod I Meal Prep: Mod I,  "I've just had to learn how to do things differently"  utilizing 2 hand chop when making BBQ Community mobility: driving Medication management: increased time  MOBILITY STATUS: Independent  POSTURE COMMENTS:  No Significant postural limitations  ACTIVITY TOLERANCE: Activity tolerance: WFL for tasks assessed  FUNCTIONAL OUTCOME MEASURES: Quick Dash: 13.63 impairment The Brachial Assessment Tool: 77/93 Subscale 1: dressing items: 20/24 Subscale 2: arm and hand items: 42/51 Subscale 3: no hand items 15/18  UPPER EXTREMITY ROM:  decreased wrist extension against gravity, decresaed finger extension long, ring, and small finger  Active ROM Right eval Left eval  Shoulder flexion [facility]  Shoulder abduction [facility]  Shoulder adduction    Shoulder extension    Shoulder internal rotation [facility]  Shoulder external rotation [facility]  Elbow flexion [facility] WFL  Elbow extension [facility] WFL  Wrist flexion [facility] WFL  Wrist extension [facility] WFL  Wrist ulnar deviation    Wrist radial deviation    Wrist pronation WFL WFL  Wrist supination WFL WFL  (Blank rows = not tested)  UPPER EXTREMITY MMT:     MMT Right eval Left eval  Shoulder flexion 5/5 5/5  Shoulder abduction 5/5 5/5  Shoulder adduction    Shoulder extension    Shoulder internal rotation 5/5 5/5  Shoulder external rotation 5/5 5/5  Middle trapezius    Lower trapezius    Elbow flexion 5/5 5/5  Elbow extension 5/5 5/5  Wrist flexion 5/5 5/5  Wrist extension 4/5 5/5  Wrist ulnar deviation    Wrist radial deviation    Wrist pronation    Wrist supination    (Blank rows = not tested)  RUE: Finger extensors 1/5  Finger flexors 4/5   Dorsal interossei 4/5   Abductor pollicis 4/5    HAND FUNCTION: Grip strength: Right: 68 lbs; Left: 21 lbs  COORDINATION: Box and Blocks:  Right 38 blocks, Left 50 blocks  SENSATION: Reporting tingling  EDEMA: NA  COGNITION: Overall cognitive status: Within functional limits for tasks assessed  OBSERVATIONS: Pt demonstrating decreased  wrist extension against gravity during box and blocks assessment.  Pt able to complete thumb opposition, however with flat fingers as opposed to rounded "O" shape.  Pt with difficulty with finger abduction and extension from table top.   TODAY'S TREATMENT:                                                                                 DATE:  09/09/23 Therapeutic activity: picking up, rotating, and placing various sized pegs into peg board for RUE dexterity and FMC.  Pt with improved ease with longer and larger pegs, however does demonstrate inconsistent ease with rotation especially with repetition. Pt utilizing table top to aid in rotation of smaller pegs due to hand fatigue.   ROM: Thumb ROM with focus on thumb abduction/adduction, flexion/extension.  Pt benefiting from positioning on table top to decrease impact of gravity.  Pt unable to extend thumb against gravity or in any position, but with improved thumb abduction on table top vs in neutral position with thumb up.  Pt tolerating thumb adduction against resistance of rubber band both on table top and in neutral position with thumb up.   09/04/23 TherAct:  Moist heat while completing R digit ABD/ADD - 5 minutes timed - Pt reports R hand feeling looser after moist heat. OT educated pt on heat home program, handout provided. Pt verbalized understanding. Assessed progress towards STG goals and 1 LTG, see below.  Replacing and removing large pegs with pegboard - to improve  RUE FM coordination and dexterity. Pt's RUE dexterity and FM coordination improved as task progressed as evidenced by increased speed and efficiency with RUE movements. Grasp and release of large blocks with large shoulder arcs to stack/unstack tower - to improve RUE FM coordination, dexterity, proprioception and grasp/release.  Stacking Jenga tower blocks - Trial 1: Alternating vertical and horizontal rows. Trial 2: 3-block "bridges" (stacked towers of x3 "bridges") - to  improve RUE FM coordination, dexterity, proprioception and grasp/release.  Pt reported R hand feels "looser" after OT session tasks today.   PATIENT EDUCATION: Education details: ongoing condition specific education, HEP completion, moving RUE during daily tasks, moist heat (handout), purpose of moist heat and HEP Person educated: Patient Education method: Explanation, Demonstration, and Verbal cues Education comprehension: verbalized understanding and needs further education  HOME EXERCISE PROGRAM: Access Code: [email or web address] URL: [email or web address] Date: 08/12/2023 - updated 09/09/23 Prepared by: [facility] - Outpatient  Rehab - [facility]  Exercises - Wrist Flexion Extension AROM with Fingers Curled and Palm Down  - 3 x daily - 5 reps - Thumb Opposition  - 3 x daily - 5 reps - Wrist Prayer Stretch  - 3 x daily - 5 reps - Ulnar Nerve/Median Glide- Low Level  - 3 x daily - 5 reps - Ulnar Nerve Mobilization - Low Level  - 3 x daily - 5 reps - Wrist Radial Ulnar Deviation AROM  - 3 x daily - 5 reps - Finger walking  - 3 x daily - 5 reps - [employer]  - 2 x daily - 10 reps - Supine Shoulder Press AAROM in Abduction with Dowel  - 2 x daily - 10 reps - Supine Shoulder Flexion AAROM with Dowel  - 2 x daily - 10 reps - Seated Thumb MP Flexion PROM  - 3 x daily - 5 reps - Thumb AROM Radial Abduction and Adduction  - 3 x daily - 5 reps - C- strength  - 3 x daily - 5 reps - Thumb Flexion with Resistance  - 3 x daily - 5 reps  08/28/23 - Palm-to-finger translation and return of coins, marbles, small food items, checkers, etc. To target FM coordination.  09/04/23 - heat home program (handout, see pt instructions)   GOALS: Goals reviewed with patient? Yes  SHORT TERM GOALS: Target date: 09/06/23  Pt will be independent in ROM and strengthening HEP. Baseline: 09/04/23 - Inconsistent  completion, continues to benefit from v/c for positioning. Goal status: IN  PROGRESS  2.  Pt will verbalize understanding of adaptive strategies, task modifications and/or potential AE needs to increase ease, safety, and independence w/ ADLs and IADLs. Baseline:  09/04/23 - Pt reports rolling cylinder for RUE ROM and strumming on guitar has improved and has been going well. Goal status: IN PROGRESS  3.  Pt will demonstrate improved UE functional use for ADLs as evidenced by increasing box/ blocks score by 4 blocks with RUE Baseline: Right 38 blocks, Left 50 blocks 09/04/23 - Right - 37 blocks Goal status: IN PROGRESS   LONG TERM GOALS: Target date: 10/04/23  Pt will demonstrate ability to pick up small objects from table top (pen, coin, etc) x5 with RUE mild increased time and no drops. Baseline: very hard to pick up small items Goal status: IN PROGRESS  2.  Pt will verbalize and/or demonstrate improved ability to type/text on phone without increased pain >2/10 on pain scale. Baseline:  Goal status: IN PROGRESS  3.  Pt will demonstrate ability to utilize BUE together without any pain or difficulty to engage in simple meal prep tasks (buttering bread, using knife/fork to cut foods, scooping foods, etc).  Baseline:  Goal status: IN PROGRESS  4.  Pt will demonstrate improved grip strength by 10# to allow for increased ease with use of tools and making the bed. Baseline: L: 68#, R: 21# 09/04/23 - R: 30# Goal status: IN PROGRESS  5.  Pt will report improved functional use of RUE during ADLs/IADLs as evidenced by improved score on BrAT by 5 points. Baseline: 77/93 Goal status: IN PROGRESS  ASSESSMENT:  CLINICAL IMPRESSION: Pt reports increased engagement in thumb mobility exercises on his own as he feels thumb is the stiffest.  OT then instructed pt in various thumb activities with pt demonstrating difficulty with thumb extension and flexion, therefore engaging in PROM in gravity reduced position to allow for increased ROM. Pt demonstrating improved  efficiency of RUE movements overall, however increased difficulty with smaller pegs and as task continued. Pt continues to benefit from education of HEP with focus on slow and steady with movements.  PERFORMANCE DEFICITS: in functional skills including ADLs, IADLs, coordination, dexterity, sensation, ROM, strength, pain, Fine motor control, Gross motor control, decreased knowledge of precautions, decreased knowledge of use of DME, and UE functional use and psychosocial skills including coping strategies and environmental adaptation.    PLAN:  OT FREQUENCY: 2x/week (2x/week for 4 weeks and then decrease to 1x/week)  OT DURATION: 8 weeks  PLANNED INTERVENTIONS: 97168 OT Re-evaluation, 97535 self care/ADL training, 16109 therapeutic exercise, 97530 therapeutic activity, 97112 neuromuscular re-education, 97140 manual therapy, 97035 ultrasound, 97010 moist heat, 97010 cryotherapy, 97032 electrical stimulation (manual), passive range of motion, coping strategies training, patient/family education, and DME and/or AE instructions  RECOMMENDED OTHER SERVICES: NA  CONSULTED AND AGREED WITH PLAN OF CARE: Patient  PLAN FOR NEXT SESSION:  Review HEP PRN Continue to address grasp/release and RUE strengthening Coordination - e.g. pegs/pegboard   Rosalio Loud, OTR/L 09/09/2023, 10:31 AM   [facility] at [facility] [address] [place], [place] [address] Phone # [phone] Fax # [phone]

## 2023-09-11 ENCOUNTER — Other Ambulatory Visit: Payer: Medicare Other

## 2023-09-15 ENCOUNTER — Encounter: Payer: Self-pay | Admitting: Internal Medicine

## 2023-09-16 ENCOUNTER — Ambulatory Visit: Payer: Medicare Other | Attending: Neurology | Admitting: Occupational Therapy

## 2023-09-16 DIAGNOSIS — R208 Other disturbances of skin sensation: Secondary | ICD-10-CM | POA: Insufficient documentation

## 2023-09-16 DIAGNOSIS — G8193 Hemiplegia, unspecified affecting right nondominant side: Secondary | ICD-10-CM | POA: Insufficient documentation

## 2023-09-16 DIAGNOSIS — M6281 Muscle weakness (generalized): Secondary | ICD-10-CM | POA: Insufficient documentation

## 2023-09-16 DIAGNOSIS — R278 Other lack of coordination: Secondary | ICD-10-CM | POA: Insufficient documentation

## 2023-09-18 ENCOUNTER — Other Ambulatory Visit: Payer: Self-pay | Admitting: Internal Medicine

## 2023-09-18 MED ORDER — AMLODIPINE BESYLATE 5 MG PO TABS: 5.0000 mg | ORAL_TABLET | Freq: Every day | ORAL | 3 refills | Status: DC

## 2023-09-18 MED ORDER — OLMESARTAN MEDOXOMIL 40 MG PO TABS: 40.0000 mg | ORAL_TABLET | Freq: Every day | ORAL | 3 refills | Status: DC

## 2023-09-23 ENCOUNTER — Ambulatory Visit: Payer: Medicare Other | Admitting: Occupational Therapy

## 2023-09-23 DIAGNOSIS — R278 Other lack of coordination: Secondary | ICD-10-CM

## 2023-09-23 DIAGNOSIS — R208 Other disturbances of skin sensation: Secondary | ICD-10-CM

## 2023-09-23 DIAGNOSIS — M6281 Muscle weakness (generalized): Secondary | ICD-10-CM

## 2023-09-23 DIAGNOSIS — G8193 Hemiplegia, unspecified affecting right nondominant side: Secondary | ICD-10-CM | POA: Diagnosis not present

## 2023-09-23 NOTE — Therapy (Signed)
OUTPATIENT OCCUPATIONAL THERAPY NEURO  Treatment Note & Progress Note  Patient Name: [name] [name] MRN: [id number] DOB:1956/10/03, 67 y.o., male Today's Date: 09/23/2023  PCP: Tresa Garter, MD REFERRING PROVIDER: Glendale Chard, DO  Occupational Therapy Progress Note  Dates of Reporting Period: 08/08/23 to 09/23/23  Objective Reports of Subjective Statement: Pt has made progress on QuickDASH and BrAT reporting still having difficulty with pain and numbness/tingling as well as decreased wrist extension and finger extension limiting true independence with fine motor control tasks.  Pt has verbalized that he has accommodated to and modified his tasks and routines such that it does not limit his performance much anymore.   Plan: continue to address ROM and FMC, 1-2 more visits and then d/c.    END OF SESSION:  OT End of Session - 09/23/23 1537     Visit Number 10    Number of Visits 13    Date for OT Re-Evaluation 10/04/23    Authorization Type [employer] Medicare    OT Start Time 1537   arrival time   OT Stop Time 1617    OT Time Calculation (min) 40 min    Activity Tolerance Patient tolerated treatment well    Behavior During Therapy [facility] for tasks assessed/performed                      Past Medical History:  Diagnosis Date   Benign localized prostatic hyperplasia with lower urinary tract symptoms (LUTS)    urologist--- dr Annabell Howells   Bladder cancer [facility])    urologist---- dr Annabell Howells---  s/p TURBT 11/2020; 03/ 2022   Coronary artery disease    mild   DDD (degenerative disc disease), cervical    From 1980s   Diabetes mellitus type II    followed by pcp--   (01-05-2021  per pt checks blood sugar three times per week,  fasting sugar --- 130--140_   Difficult intubation 07/28/2012   partial nephrectomy done @[facility]  07-28-2012  found to have anterior larynx , diffcult intubation, referred to anesthesia record in epic    Dyslipidemia    ED (erectile dysfunction)     Elevated PSA    GERD (gastroesophageal reflux disease)    History of adenomatous polyp of colon    History of kidney stones    History of prostatitis 2008   History of renal carcinoma urologist--- dr Annabell Howells   07-28-2012 for renal neoplasm  s/p right partal nephrectomy    Hypertension    followed by pcp   Osteoarthritis    Back   Renal carcinoma, right [facility])    Past Surgical History:  Procedure Laterality Date   CATARACT EXTRACTION     COLONOSCOPY N/A 03/03/2013   Procedure: COLONOSCOPY;  Surgeon: Hilarie Fredrickson, MD;  Location: [facility];  Service: Endoscopy;  Laterality: N/A;   COLONOSCOPY WITH PROPOFOL N/A 09/29/2019   Procedure: COLONOSCOPY WITH PROPOFOL;  Surgeon: Hilarie Fredrickson, MD;  Location: [facility];  Service: Endoscopy;  Laterality: N/A;   CYSTOSCOPY WITH BIOPSY N/A 12/09/2020   Procedure: CYSTOSCOPY WITH TURBT, BIOPSY AND FULGURATION;  Surgeon: Bjorn Pippin, MD;  Location: [facility];  Service: Urology;  Laterality: N/A;  REQUESTING 45 MINS   EXTRACORPOREAL SHOCK WAVE LITHOTRIPSY  01-10-2015;  03-03-2012  @[facility]    POLYPECTOMY  09/29/2019   Procedure: POLYPECTOMY;  Surgeon: Hilarie Fredrickson, MD;  Location: [facility];  Service: Endoscopy;;   ROBOT ASSISTED LAPAROSCOPIC PARTIAL NEPHRECTOMY  07-28-2012   DR Laverle Patter  @[facility]   TONSILLECTOMY  1965   TRANSURETHRAL RESECTION OF BLADDER TUMOR WITH MITOMYCIN-C N/A 01/06/2021   Procedure: TRANSURETHRAL RESECTION OF BLADDER TUMOR WITH GEMCITIBINE IN PACU;  Surgeon: Bjorn Pippin, MD;  Location: [facility];  Service: Urology;  Laterality: N/A;   TRANSURETHRAL RESECTION OF BLADDER TUMOR WITH MITOMYCIN-C N/A 09/01/2021   Procedure: TRANSURETHRAL RESECTION OF BLADDER TUMOR WITH POST OPERATIVE INSTILLATION OF GEMCITABINE;  Surgeon: Bjorn Pippin, MD;  Location: [facility];  Service: Urology;  Laterality: N/A;   TRANSURETHRAL RESECTION OF BLADDER TUMOR WITH MITOMYCIN-C Bilateral 11/13/2022   Procedure: CYSTOSCOPY  TRANSURETHRAL RESECTION OF BLADDER TUMOR BILATERAL RETROGRADES WITH INSTILL GEMCTABINE;  Surgeon: Bjorn Pippin, MD;  Location: [facility];  Service: Urology;  Laterality: Bilateral;  1 HR FOR CASE   Patient Active Problem List   Diagnosis Date Noted   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   Exposure to TB 08/26/2023   Cervical radiculitis 04/30/2023   Diabetic neuropathy (HCC) 04/23/2023   Intractable hiccups 02/05/2023   Upper abdominal pain 02/05/2023   Gastroesophageal reflux disease 02/05/2023   EKG abnormalities 02/05/2023   Scalp itch 01/23/2023   Tetrahydrocannabinol (THC) use disorder, moderate, dependence (HCC) 01/23/2023   Polyneuropathy due to type 2 diabetes mellitus (HCC) 10/23/2022   Bladder cancer (HCC) 10/23/2022   Memory problem 07/17/2022   Leg pain, anterior, right 12/26/2021   Gross hematuria 09/16/2021   Statin declined 08/06/2021   Acute abscess of face 08/03/2021   Atherosclerosis of aorta (HCC) 04/26/2021   Coronary atherosclerosis 01/08/2021   Ascending aortic aneurysm (HCC) 01/08/2021   Bladder neck obstruction 10/20/2019   History of colonic polyps    Benign neoplasm of ascending colon    Benign neoplasm of transverse colon    Viral illness 06/16/2019   Exposure to COVID-19 virus 02/02/2019   Cerumen impaction 08/27/2018   Pilonidal cyst 01/18/2017   Neoplasm of uncertain behavior of skin 01/18/2017   Hypokalemia 12/11/2016   Tobacco use disorder 10/17/2016   Well adult exam 06/22/2016   Polycythemia, 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   Abscessed tooth 07/06/2015   Sebaceous cyst 07/06/2015   Common cold virus 12/23/2014   Infected sebaceous cyst 11/09/2014   Dyslipidemia 05/19/2013   URI, acute 09/15/2012   Strep throat 09/15/2012   Renal cell cancer (HCC) 09/02/2012   Adrenal mass (HCC) 06/03/2012   Nephrolithiasis 06/03/2012   Otitis media 01/08/2012   Dark urine 01/08/2012   Decreased vision in both eyes 01/08/2012   Onychomycosis 05/25/2011   DM type 2,  controlled, with complication (HCC) 05/23/2010   Keloid scar 03/07/2010   SKIN RASH 03/07/2010   LACERATION, FINGER 09/15/2009   Osteoarthritis 08/15/2009   LOW BACK PAIN 08/15/2009   CELLULITIS AND ABSCESS OF BUTTOCK 03/29/2009   CERVICAL RADICULOPATHY 11/09/2008   WARTS, VIRAL, UNSPECIFIED 10/28/2007   Essential hypertension 06/30/2007   CLUSTER HEADACHE 06/27/2007   PSA, INCREASED 06/27/2007    ONSET DATE: June-July 2024, referral date 07/31/23  REFERRING DIAG: G54.5 (ICD-10-CM) - Neuralgic amyotrophy of right brachial plexus  THERAPY DIAG:  Hemiplegia affecting right nondominant side, unspecified etiology, unspecified hemiplegia type (HCC)  Other lack of coordination  Other disturbances of skin sensation  Muscle weakness (generalized)  Rationale for Evaluation and Treatment: Rehabilitation  SUBJECTIVE:   SUBJECTIVE STATEMENT: Pt reports missing his MRI and the rescheduled on is not until October 26, 2023.  Pt reports that it feels like his brain and arm are not connected.  Pt reports that he has made adaptations, but does not feel like he  has made "improvements"  Pt accompanied by: self  PERTINENT HISTORY: diabetes mellitus, hyperlipidemia, hypertension, bladder cancer, CAD, right renal cancer, and GERD presenting for evaluation of right hand weakness.   PRECAUTIONS: None  WEIGHT BEARING RESTRICTIONS: No  PAIN:  Are you having pain? No  FALLS: Has patient fallen in last 6 months? No, "had some close calls"  LIVING ENVIRONMENT: Lives with: lives with their family (spouse and 39 yo grandson) Lives in: House/apartment Stairs: Yes: External: ramp to back entrance, front entrance has 5-6 steps Has following equipment at home: Ramped entry, comfort height toilet  PLOF: Independent and Independent with basic ADLs  PATIENT GOALS: to be able to open/close my hand and use fingers  OBJECTIVE:  Note: Objective measures were completed at Evaluation unless otherwise  noted.  HAND DOMINANCE: Ambidextrous, writes and eat with L hand, bats/throws with R hand  ADLs: Overall ADLs: "I've just had to learn how to do things differently" Transfers/ambulation related to ADLs: Independent Eating: Mod I Grooming: Mod I UB Dressing: difficulty finding arm hole on long sleeve shirts LB Dressing: difficulty with getting zipper down on jeans Toileting: Independent Bathing: Independent Tub Shower transfers: Independent Equipment: none  IADLs: Light housekeeping: Mod I Meal Prep: Mod I,  "I've just had to learn how to do things differently" utilizing 2 hand chop when making BBQ Community mobility: driving Medication management: increased time  MOBILITY STATUS: Independent  POSTURE COMMENTS:  No Significant postural limitations  ACTIVITY TOLERANCE: Activity tolerance: WFL for tasks assessed  FUNCTIONAL OUTCOME MEASURES: 09/23/23 Quick Dash: 11.36 impairment The Brachial Assessment Tool: 88/93 Subscale 1: dressing items: 23/24 Subscale 2: arm and hand items: 49/51 Subscale 3: no hand items 16/18   Quick Dash: 13.63 impairment The Brachial Assessment Tool: 77/93 Subscale 1: dressing items: 20/24 Subscale 2: arm and hand items: 42/51 Subscale 3: no hand items 15/18  UPPER EXTREMITY ROM:  decreased wrist extension against gravity, decresaed finger extension long, ring, and small finger  Active ROM Right eval Left eval  Shoulder flexion [facility]  Shoulder abduction [facility]  Shoulder adduction    Shoulder extension    Shoulder internal rotation [facility]  Shoulder external rotation [facility]  Elbow flexion [facility] WFL  Elbow extension [facility]  Wrist flexion [facility] WFL  Wrist extension [facility] WFL  Wrist ulnar deviation    Wrist radial deviation    Wrist pronation [facility] WFL  Wrist supination WFL WFL  (Blank rows = not tested)  UPPER EXTREMITY MMT:     MMT Right eval Left eval  Shoulder flexion 5/5 5/5  Shoulder abduction 5/5 5/5  Shoulder adduction     Shoulder extension    Shoulder internal rotation 5/5 5/5  Shoulder external rotation 5/5 5/5  Middle trapezius    Lower trapezius    Elbow flexion 5/5 5/5  Elbow extension 5/5 5/5  Wrist flexion 5/5 5/5  Wrist extension 4/5 5/5  Wrist ulnar deviation    Wrist radial deviation    Wrist pronation    Wrist supination    (Blank rows = not tested)  RUE: Finger extensors 1/5  Finger flexors 4/5   Dorsal interossei 4/5   Abductor pollicis 4/5    HAND FUNCTION: Grip strength: Right: 68 lbs; Left: 21 lbs  COORDINATION: Box and Blocks:  Right 38 blocks, Left 50 blocks  SENSATION: 09/23/23 - reports numbness is getting worse.  Pt reports shocks up/down arm when he doesn't take his Gabapentin   Reporting tingling  EDEMA:  NA  COGNITION: Overall cognitive status: Within functional limits for tasks assessed  OBSERVATIONS: Pt demonstrating decreased wrist extension against gravity during box and blocks assessment.  Pt able to complete thumb opposition, however with flat fingers as opposed to rounded "O" shape.  Pt with difficulty with finger abduction and extension from table top.   TODAY'S TREATMENT:                                                                                 DATE:  09/23/23 Assessment (see above and below): pt making progress on Brachial Assessment Tool (BrAT) and Quick DASH - reporting that he feels like he has adapted well and hand limits very little of what he is doing. Coordination: picking up variety of small items with RUE. Pt able to pick up all items this session with thumb and index finger, still having to slide washer towards edge of table but otherwise able to pick up items from table top.  Attempts at rotating pen between finger tips, pt utilizing compensatory wrist and shoulder movements to attempt to rotate.  After 2 attempts, pt able to complete 2 rotations before dropping pen.      09/09/23 Therapeutic activity: picking up, rotating, and  placing various sized pegs into peg board for RUE dexterity and FMC.  Pt with improved ease with longer and larger pegs, however does demonstrate inconsistent ease with rotation especially with repetition. Pt utilizing table top to aid in rotation of smaller pegs due to hand fatigue.   ROM: Thumb ROM with focus on thumb abduction/adduction, flexion/extension.  Pt benefiting from positioning on table top to decrease impact of gravity.  Pt unable to extend thumb against gravity or in any position, but with improved thumb abduction on table top vs in neutral position with thumb up.  Pt tolerating thumb adduction against resistance of rubber band both on table top and in neutral position with thumb up.    PATIENT EDUCATION: Education details: ongoing condition specific education, HEP completion, moving RUE during daily tasks, moist heat (handout), purpose of moist heat and HEP Person educated: Patient Education method: Explanation, Demonstration, and Verbal cues Education comprehension: verbalized understanding and needs further education  HOME EXERCISE PROGRAM: Access Code: [email or web address] URL: [email or web address] Date: 08/12/2023 - updated 09/09/23 Prepared by: [facility] - Outpatient  Rehab - [facility]  Exercises - Wrist Flexion Extension AROM with Fingers Curled and Palm Down  - 3 x daily - 5 reps - Thumb Opposition  - 3 x daily - 5 reps - Wrist Prayer Stretch  - 3 x daily - 5 reps - Ulnar Nerve/Median Glide- Low Level  - 3 x daily - 5 reps - Ulnar Nerve Mobilization - Low Level  - 3 x daily - 5 reps - Wrist Radial Ulnar Deviation AROM  - 3 x daily - 5 reps - Finger walking  - 3 x daily - 5 reps - [employer]  - 2 x daily - 10 reps - Supine Shoulder Press AAROM in Abduction with Dowel  - 2 x daily - 10 reps - Supine Shoulder Flexion AAROM with Dowel  - 2 x daily - 10 reps - Seated Thumb MP Flexion  PROM  - 3 x daily - 5 reps - Thumb AROM Radial Abduction and Adduction  - 3  x daily - 5 reps - C- strength  - 3 x daily - 5 reps - Thumb Flexion with Resistance  - 3 x daily - 5 reps  08/28/23 - Palm-to-finger translation and return of coins, marbles, small food items, checkers, etc. To target FM coordination.  09/04/23 - heat home program (handout, see pt instructions)   GOALS: Goals reviewed with patient? Yes  SHORT TERM GOALS: Target date: 09/06/23  Pt will be independent in ROM and strengthening HEP. Baseline: 09/04/23 - Inconsistent completion, continues to benefit from v/c for positioning. Goal status: IN PROGRESS  2.  Pt will verbalize understanding of adaptive strategies, task modifications and/or potential AE needs to increase ease, safety, and independence w/ ADLs and IADLs. Baseline:  09/04/23 - Pt reports rolling cylinder for RUE ROM and strumming on guitar has improved and has been going well. Goal status: IN PROGRESS  3.  Pt will demonstrate improved UE functional use for ADLs as evidenced by increasing box/ blocks score by 4 blocks with RUE Baseline: Right 38 blocks, Left 50 blocks 09/04/23 - Right - 37 blocks Goal status: IN PROGRESS   LONG TERM GOALS: Target date: 10/04/23  Pt will demonstrate ability to pick up small objects from table top (pen, coin, etc) x5 with RUE mild increased time and no drops. Baseline: very hard to pick up small items Goal status: IN PROGRESS  2.  Pt will verbalize and/or demonstrate improved ability to type/text on phone without increased pain >2/10 on pain scale. Baseline:  09/23/23 - pt reports no pain when typing/texting, however has difficulty with positioning of R hand when attempting to slide to unlock or move forward in video. Goal status: IN PROGRESS  3.  Pt will demonstrate ability to utilize BUE together without any pain or difficulty to engage in simple meal prep tasks (buttering bread, using knife/fork to cut foods, scooping foods, etc).  Baseline:  09/23/23: Pt reports difficulty holding bread  in R hand when buttering with L, therefore placing bread on table top and stabilizing it with R hand while buttering with L.  Reports good control when stabilizing pots/pans/bowls with RUE while stirring with L Goal status: IN PROGRESS  4.  Pt will demonstrate improved grip strength by 10# to allow for increased ease with use of tools and making the bed. Baseline: L: 68#, R: 21# 09/04/23 - R: 30# 09/23/23: R: 34# Goal status: MET - 34# on 09/23/23  5.  Pt will report improved functional use of RUE during ADLs/IADLs as evidenced by improved score on BrAT by 5 points. Baseline: 77/93 09/23/23 88/93 Goal status: MET - 88/93 on 09/23/23  ASSESSMENT:  CLINICAL IMPRESSION: Pt reporting improved functional engagement and use of nondominant RUE.  Pt demonstrates improved ability to pick up small coins and beans from table top, did require sliding washer to edge of table top.  Pt still with difficulty with rotation of pen and thumb mobility as needed for leisure pursuits/hobbies.  Pt will still benefit from MRI due to concerns about some bilateral weakness and more definitive imaging of injury.  Pt will continue to benefit from 1-2 more visits to address ROM, and coordination as pt continues to benefit from education of HEP with focus on slow and steady with movements.  PERFORMANCE DEFICITS: in functional skills including ADLs, IADLs, coordination, dexterity, sensation, ROM, strength, pain, Fine motor control, Gross motor control,  decreased knowledge of precautions, decreased knowledge of use of DME, and UE functional use and psychosocial skills including coping strategies and environmental adaptation.    PLAN:  OT FREQUENCY: 2x/week (2x/week for 4 weeks and then decrease to 1x/week)  OT DURATION: 8 weeks  PLANNED INTERVENTIONS: 97168 OT Re-evaluation, 97535 self care/ADL training, 16109 therapeutic exercise, 97530 therapeutic activity, 97112 neuromuscular re-education, 97140 manual therapy, 97035  ultrasound, 97010 moist heat, 97010 cryotherapy, 97032 electrical stimulation (manual), passive range of motion, coping strategies training, patient/family education, and DME and/or AE instructions  RECOMMENDED OTHER SERVICES: NA  CONSULTED AND AGREED WITH PLAN OF CARE: Patient  PLAN FOR NEXT SESSION:  Review HEP PRN Continue to address grasp/release and RUE strengthening Coordination - e.g. pegs/pegboard/rotation   Rosalio Loud, OTR/L 09/23/2023, 3:38 PM   [facility] at [facility] [address], [address] [place], [place] [address] Phone # [phone] Fax # [phone]

## 2023-09-26 ENCOUNTER — Encounter: Payer: Self-pay | Admitting: Internal Medicine

## 2023-09-29 ENCOUNTER — Other Ambulatory Visit: Payer: Self-pay | Admitting: Internal Medicine

## 2023-09-29 MED ORDER — CYCLOBENZAPRINE HCL 5 MG PO TABS: ORAL_TABLET | ORAL | 1 refills | Status: DC

## 2023-09-30 ENCOUNTER — Ambulatory Visit: Payer: Medicare Other | Admitting: Occupational Therapy

## 2023-09-30 DIAGNOSIS — G8193 Hemiplegia, unspecified affecting right nondominant side: Secondary | ICD-10-CM

## 2023-09-30 DIAGNOSIS — M6281 Muscle weakness (generalized): Secondary | ICD-10-CM

## 2023-09-30 DIAGNOSIS — R278 Other lack of coordination: Secondary | ICD-10-CM | POA: Diagnosis not present

## 2023-09-30 DIAGNOSIS — R208 Other disturbances of skin sensation: Secondary | ICD-10-CM | POA: Diagnosis not present

## 2023-09-30 NOTE — Therapy (Signed)
OUTPATIENT OCCUPATIONAL THERAPY NEURO  Treatment Note & Discharge  Patient Name: [name] [name] MRN: [id number] DOB:1956-06-11, 67 y.o., male Today's Date: 09/30/2023  PCP: Tresa Garter, MD REFERRING PROVIDER: Glendale Chard, DO  OCCUPATIONAL THERAPY DISCHARGE SUMMARY  Visits from Start of Care: 11  Current functional level related to goals / functional outcomes: Pt met 4/5 LTGs and is demonstrating and reporting functional improvements in RUE.  Pt has demonstrated improved fine motor control, however is also utilizing adaptive techniques and strategies to compensate for impairments to allow for as much independence as possible.     Remaining deficits: Decreased wrist and finger extension   Education / Equipment: Nerve glides, adaptive techniques and compensatory strategies, shoulder ROM, and FMC    Patient agrees to discharge. Patient goals were met. Patient is being discharged due to meeting the stated rehab goals..     END OF SESSION:  OT End of Session - 09/30/23 1640     Visit Number 11    Number of Visits 13    Date for OT Re-Evaluation 10/04/23    Authorization Type [employer] Medicare    OT Start Time 1449    OT Stop Time 1529    OT Time Calculation (min) 40 min    Activity Tolerance Patient tolerated treatment well    Behavior During Therapy WFL for tasks assessed/performed                       Past Medical History:  Diagnosis Date   Benign localized prostatic hyperplasia with lower urinary tract symptoms (LUTS)    urologist--- dr Annabell Howells   Bladder cancer [facility])    urologist---- dr Annabell Howells---  s/p TURBT 11/2020; 03/ 2022   Coronary artery disease    mild   DDD (degenerative disc disease), cervical    From 1980s   Diabetes mellitus type II    followed by pcp--   (01-05-2021  per pt checks blood sugar three times per week,  fasting sugar --- 130--140_   Difficult intubation 07/28/2012   partial nephrectomy done @[facility]  07-28-2012  found to  have anterior larynx , diffcult intubation, referred to anesthesia record in epic    Dyslipidemia    ED (erectile dysfunction)    Elevated PSA    GERD (gastroesophageal reflux disease)    History of adenomatous polyp of colon    History of kidney stones    History of prostatitis 2008   History of renal carcinoma urologist--- dr Annabell Howells   07-28-2012 for renal neoplasm  s/p right partal nephrectomy    Hypertension    followed by pcp   Osteoarthritis    Back   Renal carcinoma, right [facility])    Past Surgical History:  Procedure Laterality Date   CATARACT EXTRACTION     COLONOSCOPY N/A 03/03/2013   Procedure: COLONOSCOPY;  Surgeon: Hilarie Fredrickson, MD;  Location: [facility];  Service: Endoscopy;  Laterality: N/A;   COLONOSCOPY WITH PROPOFOL N/A 09/29/2019   Procedure: COLONOSCOPY WITH PROPOFOL;  Surgeon: Hilarie Fredrickson, MD;  Location: [facility];  Service: Endoscopy;  Laterality: N/A;   CYSTOSCOPY WITH BIOPSY N/A 12/09/2020   Procedure: CYSTOSCOPY WITH TURBT, BIOPSY AND FULGURATION;  Surgeon: Bjorn Pippin, MD;  Location: [facility];  Service: Urology;  Laterality: N/A;  REQUESTING 45 MINS   EXTRACORPOREAL SHOCK WAVE LITHOTRIPSY  01-10-2015;  03-03-2012  @[facility]    POLYPECTOMY  09/29/2019   Procedure: POLYPECTOMY;  Surgeon: Hilarie Fredrickson, MD;  Location: [facility];  Service: Endoscopy;;   ROBOT ASSISTED LAPAROSCOPIC PARTIAL NEPHRECTOMY  07-28-2012   DR Laverle Patter  @[facility]    TONSILLECTOMY  1965   TRANSURETHRAL RESECTION OF BLADDER TUMOR WITH MITOMYCIN-C N/A 01/06/2021   Procedure: TRANSURETHRAL RESECTION OF BLADDER TUMOR WITH GEMCITIBINE IN PACU;  Surgeon: Bjorn Pippin, MD;  Location: [facility];  Service: Urology;  Laterality: N/A;   TRANSURETHRAL RESECTION OF BLADDER TUMOR WITH MITOMYCIN-C N/A 09/01/2021   Procedure: TRANSURETHRAL RESECTION OF BLADDER TUMOR WITH POST OPERATIVE INSTILLATION OF GEMCITABINE;  Surgeon: Bjorn Pippin, MD;  Location: [facility];  Service: Urology;   Laterality: N/A;   TRANSURETHRAL RESECTION OF BLADDER TUMOR WITH MITOMYCIN-C Bilateral 11/13/2022   Procedure: CYSTOSCOPY TRANSURETHRAL RESECTION OF BLADDER TUMOR BILATERAL RETROGRADES WITH INSTILL GEMCTABINE;  Surgeon: Bjorn Pippin, MD;  Location: [facility];  Service: Urology;  Laterality: Bilateral;  1 HR FOR CASE   Patient Active Problem List   Diagnosis Date Noted   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   Exposure to TB 08/26/2023   Cervical radiculitis 04/30/2023   Diabetic neuropathy (HCC) 04/23/2023   Intractable hiccups 02/05/2023   Upper abdominal pain 02/05/2023   Gastroesophageal reflux disease 02/05/2023   EKG abnormalities 02/05/2023   Scalp itch 01/23/2023   Tetrahydrocannabinol (THC) use disorder, moderate, dependence (HCC) 01/23/2023   Polyneuropathy due to type 2 diabetes mellitus (HCC) 10/23/2022   Bladder cancer (HCC) 10/23/2022   Memory problem 07/17/2022   Leg pain, anterior, right 12/26/2021   Gross hematuria 09/16/2021   Statin declined 08/06/2021   Acute abscess of face 08/03/2021   Atherosclerosis of aorta (HCC) 04/26/2021   Coronary atherosclerosis 01/08/2021   Ascending aortic aneurysm (HCC) 01/08/2021   Bladder neck obstruction 10/20/2019   History of colonic polyps    Benign neoplasm of ascending colon    Benign neoplasm of transverse colon    Viral illness 06/16/2019   Exposure to COVID-19 virus 02/02/2019   Cerumen impaction 08/27/2018   Pilonidal cyst 01/18/2017   Neoplasm of uncertain behavior of skin 01/18/2017   Hypokalemia 12/11/2016   Tobacco use disorder 10/17/2016   Well adult exam 06/22/2016   Polycythemia, 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   Abscessed tooth 07/06/2015   Sebaceous cyst 07/06/2015   Common cold virus 12/23/2014   Infected sebaceous cyst 11/09/2014   Dyslipidemia 05/19/2013   URI, acute 09/15/2012   Strep throat 09/15/2012   Renal cell cancer (HCC) 09/02/2012   Adrenal mass (HCC) 06/03/2012   Nephrolithiasis 06/03/2012   Otitis media  01/08/2012   Dark urine 01/08/2012   Decreased vision in both eyes 01/08/2012   Onychomycosis 05/25/2011   DM type 2, controlled, with complication (HCC) 05/23/2010   Keloid scar 03/07/2010   SKIN RASH 03/07/2010   LACERATION, FINGER 09/15/2009   Osteoarthritis 08/15/2009   LOW BACK PAIN 08/15/2009   CELLULITIS AND ABSCESS OF BUTTOCK 03/29/2009   CERVICAL RADICULOPATHY 11/09/2008   WARTS, VIRAL, UNSPECIFIED 10/28/2007   Essential hypertension 06/30/2007   CLUSTER HEADACHE 06/27/2007   PSA, INCREASED 06/27/2007    ONSET DATE: June-July 2024, referral date 07/31/23  REFERRING DIAG: G54.5 (ICD-10-CM) - Neuralgic amyotrophy of right brachial plexus  THERAPY DIAG:  Hemiplegia affecting right nondominant side, unspecified etiology, unspecified hemiplegia type (HCC)  Other lack of coordination  Other disturbances of skin sensation  Muscle weakness (generalized)  Rationale for Evaluation and Treatment: Rehabilitation  SUBJECTIVE:   SUBJECTIVE STATEMENT: Pt reports "when I can lift my fingers from the table top, I'll know I'm fine."  Pt accompanied by: self  PERTINENT HISTORY:  diabetes mellitus, hyperlipidemia, hypertension, bladder cancer, CAD, right renal cancer, and GERD presenting for evaluation of right hand weakness.   PRECAUTIONS: None  WEIGHT BEARING RESTRICTIONS: No  PAIN:  Are you having pain? No, reports spasms at night time  FALLS: Has patient fallen in last 6 months? No, "had some close calls"  LIVING ENVIRONMENT: Lives with: lives with their family (spouse and 66 yo grandson) Lives in: House/apartment Stairs: Yes: External: ramp to back entrance, front entrance has 5-6 steps Has following equipment at home: Ramped entry, comfort height toilet  PLOF: Independent and Independent with basic ADLs  PATIENT GOALS: to be able to open/close my hand and use fingers  OBJECTIVE:  Note: Objective measures were completed at Evaluation unless otherwise  noted.  HAND DOMINANCE: Ambidextrous, writes and eat with L hand, bats/throws with R hand  ADLs: Overall ADLs: "I've just had to learn how to do things differently" Transfers/ambulation related to ADLs: Independent Eating: Mod I Grooming: Mod I UB Dressing: difficulty finding arm hole on long sleeve shirts LB Dressing: difficulty with getting zipper down on jeans Toileting: Independent Bathing: Independent Tub Shower transfers: Independent Equipment: none  IADLs: Light housekeeping: Mod I Meal Prep: Mod I,  "I've just had to learn how to do things differently" utilizing 2 hand chop when making BBQ Community mobility: driving Medication management: increased time  MOBILITY STATUS: Independent  POSTURE COMMENTS:  No Significant postural limitations  ACTIVITY TOLERANCE: Activity tolerance: WFL for tasks assessed  FUNCTIONAL OUTCOME MEASURES: 09/23/23 Quick Dash: 11.36 impairment The Brachial Assessment Tool: 88/93 Subscale 1: dressing items: 23/24 Subscale 2: arm and hand items: 49/51 Subscale 3: no hand items 16/18   Quick Dash: 13.63 impairment The Brachial Assessment Tool: 77/93 Subscale 1: dressing items: 20/24 Subscale 2: arm and hand items: 42/51 Subscale 3: no hand items 15/18  UPPER EXTREMITY ROM:  decreased wrist extension against gravity, decresaed finger extension long, ring, and small finger  Active ROM Right eval Left eval  Shoulder flexion [facility]  Shoulder abduction [facility]  Shoulder adduction    Shoulder extension    Shoulder internal rotation [facility]  Shoulder external rotation [facility]  Elbow flexion [facility] WFL  Elbow extension [facility]  Wrist flexion [facility] WFL  Wrist extension [facility]  Wrist ulnar deviation    Wrist radial deviation    Wrist pronation [facility] WFL  Wrist supination WFL WFL  (Blank rows = not tested)  UPPER EXTREMITY MMT:     MMT Right eval Left eval Right 09/30/23  Shoulder flexion 5/5 5/5   Shoulder abduction 5/5 5/5    Shoulder adduction     Shoulder extension     Shoulder internal rotation 5/5 5/5   Shoulder external rotation 5/5 5/5   Middle trapezius     Lower trapezius     Elbow flexion 5/5 5/5   Elbow extension 5/5 5/5   Wrist flexion 5/5 5/5   Wrist extension 4/5 5/5 4/5  Wrist ulnar deviation     Wrist radial deviation     Wrist pronation     Wrist supination     (Blank rows = not tested)  RUE: Finger extensors 1/5 09/30/23 improvements noted in index and long fingers still ~3/5  Finger flexors 4/5   Dorsal interossei 4/5   Abductor pollicis 4/5    HAND FUNCTION: Grip strength: Right: 68 lbs; Left: 21 lbs  COORDINATION: Box and Blocks:  Right 38 blocks, Left 50 blocks  SENSATION: 09/23/23 - reports  numbness is getting worse.  Pt reports shocks up/down arm when he doesn't take his Gabapentin   Reporting tingling  EDEMA: NA  COGNITION: Overall cognitive status: Within functional limits for tasks assessed  OBSERVATIONS: Pt demonstrating decreased wrist extension against gravity during box and blocks assessment.  Pt able to complete thumb opposition, however with flat fingers as opposed to rounded "O" shape.  Pt with difficulty with finger abduction and extension from table top.   TODAY'S TREATMENT:                                                                                 DATE:  09/30/23 Wrist extension: engaged in wrist extension in neutral on table top and then in pronation.  Pt with ability to complete in both positions, however with decreased motor control when in pronated position.   Functional grasp: engaged in picking up rings from table top and placing onto vertical stack.  Pt with good pinch and wrist flexion to pick up rings from table top. Pt utilizing more radial and ulnar deviation to pick rings up and remove from vertical stack due to decreased wrist extension.  OT encouraging pt to attempt with increased wrist extension with pt demonstrating improved success  with repetition. Coordination: picking coins and placing into container, picking up and stacking/unstacking coins with improved ease with task.  Pt's ring finger still demonstrating decreased flexion, therefore impeding ease with coordination tasks.   AE use: OT educated on use of stylus to aid in opening apps and scrolling when searching in a video.  Pt utilizing sylus as well as side of long finger when searching with mild improvements with use of stylus. OT encouraged use of stylus as able for improved ease.    09/23/23 Assessment (see above and below): pt making progress on Brachial Assessment Tool (BrAT) and Quick DASH - reporting that he feels like he has adapted well and hand limits very little of what he is doing. Coordination: picking up variety of small items with RUE. Pt able to pick up all items this session with thumb and index finger, still having to slide washer towards edge of table but otherwise able to pick up items from table top.  Attempts at rotating pen between finger tips, pt utilizing compensatory wrist and shoulder movements to attempt to rotate.  After 2 attempts, pt able to complete 2 rotations before dropping pen.      09/09/23 Therapeutic activity: picking up, rotating, and placing various sized pegs into peg board for RUE dexterity and FMC.  Pt with improved ease with longer and larger pegs, however does demonstrate inconsistent ease with rotation especially with repetition. Pt utilizing table top to aid in rotation of smaller pegs due to hand fatigue.   ROM: Thumb ROM with focus on thumb abduction/adduction, flexion/extension.  Pt benefiting from positioning on table top to decrease impact of gravity.  Pt unable to extend thumb against gravity or in any position, but with improved thumb abduction on table top vs in neutral position with thumb up.  Pt tolerating thumb adduction against resistance of rubber band both on table top and in neutral position with thumb  up.  PATIENT EDUCATION: Education details: ongoing condition specific education, HEP completion, moving RUE during daily tasks, moist heat (handout), purpose of moist heat and HEP Person educated: Patient Education method: Explanation, Demonstration, and Verbal cues Education comprehension: verbalized understanding and needs further education  HOME EXERCISE PROGRAM: Access Code: [email or web address] URL: [email or web address] Date: 08/12/2023 - updated 09/09/23 Prepared by: [facility] - Outpatient  Rehab - [facility]  Exercises - Wrist Flexion Extension AROM with Fingers Curled and Palm Down  - 3 x daily - 5 reps - Thumb Opposition  - 3 x daily - 5 reps - Wrist Prayer Stretch  - 3 x daily - 5 reps - Ulnar Nerve/Median Glide- Low Level  - 3 x daily - 5 reps - Ulnar Nerve Mobilization - Low Level  - 3 x daily - 5 reps - Wrist Radial Ulnar Deviation AROM  - 3 x daily - 5 reps - Finger walking  - 3 x daily - 5 reps - [employer]  - 2 x daily - 10 reps - Supine Shoulder Press AAROM in Abduction with Dowel  - 2 x daily - 10 reps - Supine Shoulder Flexion AAROM with Dowel  - 2 x daily - 10 reps - Seated Thumb MP Flexion PROM  - 3 x daily - 5 reps - Thumb AROM Radial Abduction and Adduction  - 3 x daily - 5 reps - C- strength  - 3 x daily - 5 reps - Thumb Flexion with Resistance  - 3 x daily - 5 reps  08/28/23 - Palm-to-finger translation and return of coins, marbles, small food items, checkers, etc. To target FM coordination.  09/04/23 - heat home program (handout, see pt instructions)   GOALS: Goals reviewed with patient? Yes  SHORT TERM GOALS: Target date: 09/06/23  Pt will be independent in ROM and strengthening HEP. Baseline: 09/04/23 - Inconsistent completion, continues to benefit from v/c for positioning. Goal status: IN PROGRESS  2.  Pt will verbalize understanding of adaptive strategies, task modifications and/or potential AE needs to increase ease, safety,  and independence w/ ADLs and IADLs. Baseline:  09/04/23 - Pt reports rolling cylinder for RUE ROM and strumming on guitar has improved and has been going well. Goal status: IN PROGRESS  3.  Pt will demonstrate improved UE functional use for ADLs as evidenced by increasing box/ blocks score by 4 blocks with RUE Baseline: Right 38 blocks, Left 50 blocks 09/04/23 - Right - 37 blocks Goal status: IN PROGRESS   LONG TERM GOALS: Target date: 10/04/23  Pt will demonstrate ability to pick up small objects from table top (pen, coin, etc) x5 with RUE mild increased time and no drops. Baseline: very hard to pick up small items Goal status: MET - 09/30/23  2.  Pt will verbalize and/or demonstrate improved ability to type/text on phone without increased pain >2/10 on pain scale. Baseline:  09/23/23 - pt reports no pain when typing/texting, however has difficulty with positioning of R hand when attempting to slide to unlock or move forward in video. Goal status: MET - 09/30/23  3.  Pt will demonstrate ability to utilize BUE together without any pain or difficulty to engage in simple meal prep tasks (buttering bread, using knife/fork to cut foods, scooping foods, etc).  Baseline:  09/23/23: Pt reports difficulty holding bread in R hand when buttering with L, therefore placing bread on table top and stabilizing it with R hand while buttering with L.  Reports good control when stabilizing pots/pans/bowls with RUE  while stirring with L Goal status: Not met  4.  Pt will demonstrate improved grip strength by 10# to allow for increased ease with use of tools and making the bed. Baseline: L: 68#, R: 21# 09/04/23 - R: 30# 09/23/23: R: 34# Goal status: MET - 34# on 09/23/23  5.  Pt will report improved functional use of RUE during ADLs/IADLs as evidenced by improved score on BrAT by 5 points. Baseline: 77/93 09/23/23 88/93 Goal status: MET - 88/93 on 09/23/23  ASSESSMENT:  CLINICAL IMPRESSION: Pt  reporting improved functional engagement and use of nondominant RUE.  Pt demonstrates improved ability to pick up small coins, rings, and pens from table top. Pt still with difficulty with wrist and finger extension for full grasp and manipulation of small objects.  At this point, pt has maximized the benefits of therapy, however will still benefit from MRI due to concerns about some bilateral weakness and more definitive imaging of injury.  Pt reports understanding of recommendations in regards to return for therapy as needed, if change in symptoms and/or if more definitive imaging to dictate change in treatment.  Pt to contact MD if need for return eval arises.  Pt agreeable to d/c at this time.  PERFORMANCE DEFICITS: in functional skills including ADLs, IADLs, coordination, dexterity, sensation, ROM, strength, pain, Fine motor control, Gross motor control, decreased knowledge of precautions, decreased knowledge of use of DME, and UE functional use and psychosocial skills including coping strategies and environmental adaptation.    PLAN:  OT FREQUENCY: 2x/week (2x/week for 4 weeks and then decrease to 1x/week)  OT DURATION: 8 weeks  PLANNED INTERVENTIONS: 97168 OT Re-evaluation, 97535 self care/ADL training, 03474 therapeutic exercise, 97530 therapeutic activity, 97112 neuromuscular re-education, 97140 manual therapy, 97035 ultrasound, 97010 moist heat, 97010 cryotherapy, 97032 electrical stimulation (manual), passive range of motion, coping strategies training, patient/family education, and DME and/or AE instructions  RECOMMENDED OTHER SERVICES: NA  CONSULTED AND AGREED WITH PLAN OF CARE: Patient    Rosalio Loud, OTR/L 09/30/2023, 4:40 PM   [facility] at [facility] [address] [place], [place] [address] Phone # [phone] Fax # [phone]

## 2023-10-01 ENCOUNTER — Encounter: Payer: Self-pay | Admitting: Internal Medicine

## 2023-10-02 ENCOUNTER — Ambulatory Visit: Payer: Medicare Other | Admitting: Occupational Therapy

## 2023-10-23 ENCOUNTER — Telehealth: Payer: Self-pay | Admitting: Internal Medicine

## 2023-10-23 NOTE — Telephone Encounter (Signed)
 Copied from CRM [id number]. Topic: Clinical - Medical Advice >> Oct 23, 2023 11:20 AM Denese Killings wrote: Reason for CRM: Patient states that he is about to reactivate insurance and will be getting the 14 mg of Rybelsus. Patient wants to know if that is ok.

## 2023-10-25 DIAGNOSIS — H524 Presbyopia: Secondary | ICD-10-CM | POA: Diagnosis not present

## 2023-10-25 DIAGNOSIS — H52223 Regular astigmatism, bilateral: Secondary | ICD-10-CM | POA: Diagnosis not present

## 2023-10-25 DIAGNOSIS — D3132 Benign neoplasm of left choroid: Secondary | ICD-10-CM | POA: Diagnosis not present

## 2023-10-25 DIAGNOSIS — H35033 Hypertensive retinopathy, bilateral: Secondary | ICD-10-CM | POA: Diagnosis not present

## 2023-10-25 DIAGNOSIS — D3131 Benign neoplasm of right choroid: Secondary | ICD-10-CM | POA: Diagnosis not present

## 2023-10-25 DIAGNOSIS — E119 Type 2 diabetes mellitus without complications: Secondary | ICD-10-CM | POA: Diagnosis not present

## 2023-10-25 DIAGNOSIS — H5203 Hypermetropia, bilateral: Secondary | ICD-10-CM | POA: Diagnosis not present

## 2023-10-25 LAB — HM DIABETES EYE EXAM

## 2023-10-26 ENCOUNTER — Ambulatory Visit
Admission: RE | Admit: 2023-10-26 | Discharge: 2023-10-26 | Disposition: A | Discharge status: Home / Self Care | Payer: Medicare Other | Source: Ambulatory Visit | Attending: Neurology | Admitting: Neurology

## 2023-10-26 DIAGNOSIS — G545 Neuralgic amyotrophy: Secondary | ICD-10-CM

## 2023-10-26 DIAGNOSIS — R531 Weakness: Secondary | ICD-10-CM | POA: Diagnosis not present

## 2023-10-26 DIAGNOSIS — M19011 Primary osteoarthritis, right shoulder: Secondary | ICD-10-CM | POA: Diagnosis not present

## 2023-10-26 MED ORDER — GADOPICLENOL 0.5 MMOL/ML IV SOLN
9.0000 mL | Freq: Once | INTRAVENOUS | Status: AC | PRN
  Administered 2023-10-26: 9 mL via INTRAVENOUS

## 2023-10-26 NOTE — Telephone Encounter (Signed)
 She needs to start with the low dose again.  Thank you

## 2023-10-28 NOTE — Telephone Encounter (Signed)
 LVM for pt to call back. Please inform pt of providers response "She needs to start with the low dose again. Thank you "

## 2023-10-30 ENCOUNTER — Ambulatory Visit: Payer: Medicare Other | Admitting: Neurology

## 2023-10-30 ENCOUNTER — Encounter: Payer: Self-pay | Admitting: Neurology

## 2023-10-30 VITALS — BP 170/102 | HR 87 | Ht 66.0 in | Wt 183.0 lb

## 2023-10-30 DIAGNOSIS — G545 Neuralgic amyotrophy: Secondary | ICD-10-CM | POA: Diagnosis not present

## 2023-10-30 NOTE — Patient Instructions (Signed)
 Encouraged to monitor blood pressure at home and take your medication daily.  Please share your blood pressure reading with your primary care provider.

## 2023-10-30 NOTE — Progress Notes (Signed)
 Follow-up Visit   Date: 10/30/2023    [name] [name] MRN: [id number] DOB: 1956-09-04    [name] [name] is a 68 y.o. ambidextrousmale with diabetes mellitus, hyperlipidemia, hypertension, bladder cancer, CAD, right renal cancer, and GERD returning to the clinic for follow-up of right brachial plexopathy.  The patient was accompanied to the clinic by self.    IMPRESSION/PLAN: Neuralgic amyotrophy of the right arm.  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  History of pain followed by weakness also fits neuralgic amyotrophy. Serology testing has been normal.  - Awaiting results of MRI brachial plexus.  Further recommendations based on these findings.  - Continue home hand exercises  Left ulnar neuropathy at the elbow, currently asymptomatic. Diabetic neuropathy affecting the feet, mild.    Return to clinic in 4 months  --------------------------------------------- History of present illness: Around July, he began having right shoulder pain, sharp and throbbing.  He recalls having to sleep in a recliner for several weeks.  His pain eventually resolved, but then he noticed weakness in the right hand, such that he is unable to extend his wrist or fingers.  He takes gabapentin  300mg  three times daily which helps with arm tingling.  Prior testing includes MRI cervical spine which shows disc herniation at right C5-6.  NCS/EMG performed here is consistent with right brachial plexopathy affecting primarily the middle and inferior trunks.  He is here is for further evaluation.    He reports having a similar problem in the neck in the late 1980s, which was treated conservatively.   UPDATE 08/23/2023:  He is here for acute follow-up with complains of left hand tingling and numbness involving the 4th and 5th fingers.  He denies hand weakness.  Symptoms are mild and  not constant.  He was concerned because the tingling felt the same as when his right arm started getting weak.  He has been doing OT but unfortunately not noticed any significant improvement of right hand and finger extension.  He takes gabapentin  200mg  twice daily.  He is most concern whether he has TB because he found out that his daughter has TB.  He denies fever, night sweats, weight loss, or new cough.   UPDATE 10/30/2023:  He is here for follow-up visit.  He has completed PT with mild improvement of strength in the right hand but he still is unable to extend the fingers.  He continues to have tingling in the arms and takes gabapentin  100mg  at bedtime.  He has side effects with higher dose.  He denies any new weakness. He is no longer having tingling in the left hand.  He had MRI brachial plexus on 1/11, however results are not available.   Medications:  Current Outpatient Medications on File Prior to Visit  Medication Sig Dispense Refill   amLODipine  (NORVASC ) 5 MG tablet Take 1 tablet (5 mg total) by mouth daily. 90 tablet 3   Ascorbic Acid (VITAMIN C) 1000 MG tablet Take 1,000 mg by mouth once a week.     blood glucose meter kit and supplies KIT Use to test blood sugar once a day. DX: E11.9 1 each 0   Blood Glucose Monitoring Suppl (CONTOUR NEXT MONITOR) w/Device KIT 1 Device by Does not apply route daily. 1 kit 0   cholecalciferol (VITAMIN D ) 1000 UNITS tablet Take 1,000 Units by mouth daily.     clobetasol  (TEMOVATE ) 0.05 %  external solution Apply 1 Application topically 2 (two) times daily as needed. Itchy scalp 50 mL 3   CONTOUR NEXT TEST test strip USE AS DIRECTED 100 strip 5   cyclobenzaprine  (FLEXERIL ) 5 MG tablet TAKE 1 TABLET BY MOUTH THREE TIMES A DAY AS NEEDED FOR MUSCLE SPASMS 90 tablet 1   gabapentin  (NEURONTIN ) 100 MG capsule Take 1 capsule (100 mg total) by mouth 4 (four) times daily as needed. (Patient taking differently: Take 100 mg by mouth 4 (four) times daily as needed. 2  capsules in the morning, 2 capsules in the afternoon, and 3 capsules at bedtime) 120 capsule 3   glimepiride  (AMARYL ) 4 MG tablet Take 1 tablet (4 mg total) by mouth in the morning and at bedtime. 180 tablet 3   HYDROcodone -acetaminophen  (NORCO) 10-325 MG tablet Take 1 tablet by mouth 2 (two) times daily as needed for severe pain. 40 tablet 0   ibuprofen  (ADVIL ) 800 MG tablet TAKE 1 TABLET (800 MG TOTAL) BY MOUTH DAILY AS NEEDED FOR MODERATE PAIN. 30 tablet 0   ketoconazole (NIZORAL) 2 % cream Apply 1 Application topically daily as needed for irritation.     Lancets MISC Use to check blood sugar once per day. 100 each 3   metFORMIN  (GLUCOPHAGE ) 500 MG tablet TAKE 1 TABLET BY MOUTH 2 TIMES DAILY WITH A MEAL. 180 tablet 3   olmesartan  (BENICAR ) 40 MG tablet Take 1 tablet (40 mg total) by mouth daily. 90 tablet 3   potassium chloride  SA (KLOR-CON ) 20 MEQ tablet Take 1 tablet (20 mEq total) by mouth daily. (Patient taking differently: Take 20 mEq by mouth 2 (two) times a week.) 90 tablet 11   repaglinide  (PRANDIN ) 2 MG tablet TAKE 1 TABLET BY MOUTH 3 TIMES A DAY BEFORE MEALS 270 tablet 3   RYBELSUS  14 MG TABS Take 1 tablet by mouth daily.     tadalafil (CIALIS) 5 MG tablet Take 5 mg by mouth daily.     Current Facility-Administered Medications on File Prior to Visit  Medication Dose Route Frequency Provider Last Rate Last Admin   gemcitabine  (GEMZAR ) chemo syringe for bladder instillation 2,000 mg  2,000 mg Bladder Instillation Once Wrenn, John, MD        Allergies:  Allergies  Allergen Reactions   Tamsulosin  Other (See Comments)    Unable to perform sexually   Codeine Itching and Rash   Wound Dressing Adhesive Itching and Other (See Comments)    Band-Aid adhesive    Vital Signs:  BP (!) 174/104   Pulse 87   Ht 5\' 6"  (1.676 m)   Wt 183 lb (83 kg)   SpO2 97%   BMI 29.54 kg/m    Neurological Exam: MENTAL STATUS including orientation to time, place, person, recent and remote memory,  attention span and concentration, language, and fund of knowledge is normal.  Speech is not dysarthric.  CRANIAL NERVES:  Pupils equal round and reactive to light.  Normal conjugate, extra-ocular eye movements in all directions of gaze.  No ptosis.  Face is symmetric.   Upper Extremity:  Right   Left  Deltoid  5/5    5/5   Biceps  5/5    5/5   Triceps  5/5    5/5   Wrist extensors * 5/5    5/5   Wrist flexors  5/5    5/5   Finger extensors  1/5    5/5   Finger flexors  4+/5    5/5  Dorsal interossei  4/5    5/5   Abductor pollicis  4/5    5/5   Tone (Ashworth scale)  0   0    Lower Extremity:  Right   Left  Hip flexors  5/5    5/5   Knee flexors  5/5    5/5   Knee extensors  5/5    5/5   Dorsiflexors  5/5    5/5   Plantarflexors  5/5    5/5   Toe extensors  5/5    5/5   Toe flexors  5/5    5/5   Tone (Ashworth scale)  0   0    MSRs:                                              Right        Left brachioradialis 2+   2+  biceps 2+   2+  triceps 2+   2+  patellar 2+   2+  ankle jerk 1+   1+  Hoffman no   no  plantar response down   down    SENSORY:   Reduced vibration at the ankles bilaterally.    COORDINATION/GAIT:    Gait narrow based and stable. Stressed and tandem gait intact.  Data: MRI cervical spine wo contrast 07/03/2023: 1. 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 2. C5-6: Spondylosis with endplate osteophytes and protruding disc material more prominent in the right posterolateral direction. Effacement of the subarachnoid space with indentation of the cord, particularly on the right. AP diameter of the canal in the midline 7.4 mm. Foraminal stenosis right worse than left. In particular, the right C6 nerve root is likely affected. 3. C6-7: Endplate osteophytes and bulging of the disc more prominent towards the right. Moderate foraminal narrowing on the right that could possibly affect the C7  nerve. This is not as severe as the level above.   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 Additionally, there is evidence of a chronic C7 radiculopathy (mild) and left ulnar neuropathy across the elbow (very mild).    Thank you for allowing me to participate in patient's care.  If I can answer any additional questions, I would be pleased to do so.    Sincerely,    Ashe Graybeal K. Lydia Sams, DO

## 2023-11-04 ENCOUNTER — Encounter: Payer: Self-pay | Admitting: Internal Medicine

## 2023-11-04 ENCOUNTER — Ambulatory Visit: Payer: Self-pay | Admitting: Internal Medicine

## 2023-11-04 ENCOUNTER — Ambulatory Visit (INDEPENDENT_AMBULATORY_CARE_PROVIDER_SITE_OTHER): Payer: Medicare Other | Admitting: Internal Medicine

## 2023-11-04 VITALS — BP 146/84 | HR 79 | Temp 98.3°F | Ht 66.0 in | Wt 187.0 lb

## 2023-11-04 DIAGNOSIS — E118 Type 2 diabetes mellitus with unspecified complications: Secondary | ICD-10-CM | POA: Diagnosis not present

## 2023-11-04 DIAGNOSIS — R066 Hiccough: Secondary | ICD-10-CM | POA: Diagnosis not present

## 2023-11-04 MED ORDER — METFORMIN HCL 500 MG PO TABS: 500.0000 mg | ORAL_TABLET | Freq: Two times a day (BID) | ORAL | 0 refills | Status: DC

## 2023-11-04 NOTE — Patient Instructions (Addendum)
        Medications changes include :   stop Rybelsus, go back to taking metformin 500 mg twice a day   Hopefully your hiccups will go away over the next week - let us know if they do not.     Return if symptoms worsen or fail to improve.

## 2023-11-04 NOTE — Telephone Encounter (Signed)
Copied from CRM [id number]. Topic: Appointments - Appointment Info/Confirmation >> Nov 04, 2023  8:37 AM [name] [name] wrote: Patient has the hiccups that he has had for days and it cause him trouble breathing Patient/patient representative is calling for information regarding an appointment.   Chief Complaint: hiccups Symptoms: burping elevated bp Frequency: ongoing since last Tuesday  Pertinent Negatives: Patient denies chest pain or shortness of breath Disposition: [] ED /[] Urgent Care (no appt availability in office) / [x] Appointment(In office/virtual)/ []  [facility]/ [] Home Care/ [] Refused Recommended Disposition /[] [facility]/ []  Follow-up with PCP Additional Notes: The patient reported ongoing hiccups since last Tuesday.  He was off of his Rybelsus for about 6 months due to insurance and he restarted it Monday.  He was unable to eat due to nausea Monday after taking the medication.  The hiccups started Tuesday morning and he vomited 3 times.  He said it feels like indigestion with burping and then the hiccups begin.  He denied chest pain, shortness of breath, abdominal pain, or nausea.  He reported relief when he taking gabapentin but it makes him woozy.  He had a similar issue 6-8 months ago and was prescribed 2 medications that were unhelpful.  He is concerned that the Rybelsus is interacting with his blood pressure medication as his blood pressure has been elevated recently.  His reading this morning was 196/112.  His SBP has been 160-180.  He was scheduled for a same day appointment for follow up.    Reason for Disposition  [1] Hiccups present > 24 hours AND [2] nearly continuous  Answer Assessment - Initial Assessment Questions 1. ONSET: "When did the hiccups begin?"      1 week ago; last Tuesday 2. SEVERITY: "How bad are the hiccups now?"  (Severe: unable to eat, drink or sleep because of hiccups)     Constant May stop 10-20 minutes 3. TREATMENT: "What have you  done so far to treat the hiccups?"     Nothing  4. RECURRENT SYMPTOM: "Have you ever had severe hiccups before?" If Yes, ask: "When was the last time?" and "What happened that time?"      6-8 months; prescribed meds that were unhelpful  5. OTHER SYMPTOMS: "Do you have any other symptoms? (e.g., chest pain, difficulty breathing,  abdomen pain, vomiting)     Hiccup air feels like it gets caught in throat and can't breath in or out 5 seconds  Protocols used: Hiccups-A-AH

## 2023-11-04 NOTE — Progress Notes (Signed)
Subjective:    Patient ID: [name] [name], male    DOB: 11-07-1955, 68 y.o.   MRN: [id number]      HPI [name] [name] is here for  Chief Complaint  Patient presents with   Hiccups    Rm 20. All day and all night (maybe half hour/hour in between) going on since last Tuesday when started back on rybelsus.     Here for intractable hiccups.  It started last Tuesday.     Restarted rybelsus last Tuesday and did not eat anything.  He started back with 14 mg.  He got nauseous and did not eat at all that day.  That night he developed the hiccups that are persistent.  He will get breaks of 35-45 minutes and then they return.  He can feel the hiccups coming up and sometimes feels like they get stuck and he can not breath well.  He has been burping a lot.  He realized after he took a 14 mg that he was only supposed to take 7 and has been taking 7 since then.  He does have gabapentin at home and he figured since it could be a nerve issue he did take gabapentin a few times, but it made him very drowsy.  He had hiccups 6-8 months ago - went to urgent care.  It also started when he started the Rybelsus.  He was prescribed thorazine, something for indigestion.  They did end up going away.     Prior to starting Rybelsus last week he denies any reflux or belching.  Recently saw Dr. Allena Katz for right hand weakness and workup showed probable brachial plexopathies affecting middle and inferior trunks.  An MRI was done of the brachial plexus-pending.     Medications and allergies reviewed with patient and updated if appropriate.  Current Outpatient Medications on File Prior to Visit  Medication Sig Dispense Refill   amLODipine (NORVASC) 5 MG tablet Take 1 tablet (5 mg total) by mouth daily. 90 tablet 3   cholecalciferol (VITAMIN D) 1000 UNITS tablet Take 1,000 Units by mouth daily.     cyclobenzaprine (FLEXERIL) 5 MG tablet TAKE 1 TABLET BY MOUTH THREE TIMES A DAY AS NEEDED FOR MUSCLE SPASMS 90 tablet 1    gabapentin (NEURONTIN) 100 MG capsule Take 1 capsule (100 mg total) by mouth 4 (four) times daily as needed. (Patient taking differently: Take 100 mg by mouth 4 (four) times daily as needed. 2 capsules in the morning, 2 capsules in the afternoon, and 3 capsules at bedtime) 120 capsule 3   glimepiride (AMARYL) 4 MG tablet Take 1 tablet (4 mg total) by mouth in the morning and at bedtime. 180 tablet 3   HYDROcodone-acetaminophen (NORCO) 10-325 MG tablet Take 1 tablet by mouth 2 (two) times daily as needed for severe pain. 40 tablet 0   ibuprofen (ADVIL) 800 MG tablet TAKE 1 TABLET (800 MG TOTAL) BY MOUTH DAILY AS NEEDED FOR MODERATE PAIN. 30 tablet 0   ketoconazole (NIZORAL) 2 % cream Apply 1 Application topically daily as needed for irritation.     Lancets MISC Use to check blood sugar once per day. 100 each 3   olmesartan (BENICAR) 40 MG tablet Take 1 tablet (40 mg total) by mouth daily. 90 tablet 3   potassium chloride SA (KLOR-CON) 20 MEQ tablet Take 1 tablet (20 mEq total) by mouth daily. (Patient taking differently: Take 20 mEq by mouth 2 (two) times a week.) 90 tablet 11   repaglinide (  PRANDIN) 2 MG tablet TAKE 1 TABLET BY MOUTH 3 TIMES A DAY BEFORE MEALS 270 tablet 3   RYBELSUS 14 MG TABS Take 1 tablet by mouth daily.     tadalafil (CIALIS) 5 MG tablet Take 5 mg by mouth daily.     Ascorbic Acid (VITAMIN C) 1000 MG tablet Take 1,000 mg by mouth once a week.     blood glucose meter kit and supplies KIT Use to test blood sugar once a day. DX: E11.9 1 each 0   Blood Glucose Monitoring Suppl (CONTOUR NEXT MONITOR) w/Device KIT 1 Device by Does not apply route daily. 1 kit 0   clobetasol (TEMOVATE) 0.05 % external solution Apply 1 Application topically 2 (two) times daily as needed. Itchy scalp 50 mL 3   CONTOUR NEXT TEST test strip USE AS DIRECTED 100 strip 5   metFORMIN (GLUCOPHAGE) 500 MG tablet TAKE 1 TABLET BY MOUTH 2 TIMES DAILY WITH A MEAL. (Patient not taking: Reported on 11/04/2023) 180  tablet 3   Current Facility-Administered Medications on File Prior to Visit  Medication Dose Route Frequency Provider Last Rate Last Admin   gemcitabine (GEMZAR) chemo syringe for bladder instillation 2,000 mg  2,000 mg Bladder Instillation Once Bjorn Pippin, MD        Review of Systems  Constitutional:  Negative for fever.  HENT:  Negative for congestion, sore throat and trouble swallowing.   Respiratory:  Positive for cough (smoker cough - chronic). Negative for shortness of breath and wheezing.   Cardiovascular:  Negative for chest pain, palpitations and leg swelling.  Gastrointestinal:  Negative for abdominal pain and nausea.       Typically does not have gerd, reflux, burping       Objective:   Vitals:   11/04/23 1353  BP: (!) 146/84  Pulse: 79  Temp: 98.3 F (36.8 C)  SpO2: 98%   BP Readings from Last 3 Encounters:  11/04/23 (!) 146/84  10/30/23 (!) 170/102  08/26/23 122/80   Wt Readings from Last 3 Encounters:  11/04/23 187 lb (84.8 kg)  10/30/23 183 lb (83 kg)  08/26/23 186 lb (84.4 kg)   Body mass index is 30.18 kg/m.    Physical Exam Constitutional:      General: He is not in acute distress.    Appearance: Normal appearance. He is not ill-appearing.  HENT:     Head: Normocephalic and atraumatic.  Eyes:     Conjunctiva/sclera: Conjunctivae normal.  Cardiovascular:     Rate and Rhythm: Normal rate and regular rhythm.     Heart sounds: Normal heart sounds.  Pulmonary:     Effort: Pulmonary effort is normal. No respiratory distress.     Breath sounds: Normal breath sounds. No wheezing or rales.  Abdominal:     General: There is no distension.     Palpations: Abdomen is soft.     Tenderness: There is no abdominal tenderness.  Musculoskeletal:     Right lower leg: No edema.     Left lower leg: No edema.  Skin:    General: Skin is warm and dry.     Findings: No rash.  Neurological:     Mental Status: He is alert. Mental status is at baseline.   Psychiatric:        Mood and Affect: Mood normal.            Assessment & Plan:    Hiccups: Acute Started last week after starting Rybelsus Of note he also  had an episode of hiccups 6-8 months ago that started 1 week started Rybelsus as well-went to urgent care at that time Last week he started Rybelsus 14 mg instead of 7 mg which she then transition to after the first day Has continued to have intermittent hiccups-occurring daily with breaks of 30-45 minutes Has increased burping may be some reflux which is unusual for him It is possible that the mechanism of action of the Rybelsus is triggering reflux, hiccups Stop Rybelsus Resume metformin 500 mg twice daily with food If hiccups resolve then it is most likely related to the mechanism of action of the Rybelsus Discussed there are other options for his sugars I do not recommend taking the gabapentin If symptoms are not resolving we will do a trial of baclofen Hiccups more likely related to Rybelsus given history versus brachial plexopathy He will let us know if hiccups do not resolve    Diabetes, type II: Chronic Currently taking Rybelsus 7 mg daily and glimepiride 4 mg twice daily Restarted Rybelsus last week once his insurance started covering it again and he did have nausea, burping and some reflux-did not eat all day and then night developed hiccups and has had them since then Stop Rybelsus since it is a possible cause of the hiccups Continue glimepiride 4 mg twice daily Resume metformin 500 mg twice daily-prescription sent to pharmacy

## 2023-11-12 ENCOUNTER — Encounter: Payer: Self-pay | Admitting: Internal Medicine

## 2023-11-12 ENCOUNTER — Telehealth: Payer: Self-pay | Admitting: Neurology

## 2023-11-12 NOTE — Telephone Encounter (Signed)
Pt. Called for Referral to Dr. Georgia Duff was supposed to be sent on 10/30/23

## 2023-11-13 NOTE — Telephone Encounter (Signed)
Pt. Called for Referral to Dr. Georgia Duff was supposed to be sent on 10/30/23   Patient would like a call back about this today

## 2023-11-13 NOTE — Telephone Encounter (Signed)
I have called Dr. Apolinar Junes Smith's office in [place] and have scheduled patient to be seen 11/18/23 at 2:30 pm. The referral specialist  is giving patient a call to notify him about this.

## 2023-11-15 DIAGNOSIS — Z8551 Personal history of malignant neoplasm of bladder: Secondary | ICD-10-CM | POA: Diagnosis not present

## 2023-11-15 DIAGNOSIS — R3915 Urgency of urination: Secondary | ICD-10-CM | POA: Diagnosis not present

## 2023-11-15 DIAGNOSIS — R8289 Other abnormal findings on cytological and histological examination of urine: Secondary | ICD-10-CM | POA: Diagnosis not present

## 2023-11-18 ENCOUNTER — Ambulatory Visit: Payer: Medicare Other | Admitting: Neurosurgery

## 2023-11-18 VITALS — BP 144/84 | Ht 66.0 in | Wt 187.0 lb

## 2023-11-18 DIAGNOSIS — G5631 Lesion of radial nerve, right upper limb: Secondary | ICD-10-CM | POA: Diagnosis not present

## 2023-11-18 DIAGNOSIS — G540 Brachial plexus disorders: Secondary | ICD-10-CM | POA: Diagnosis not present

## 2023-11-18 NOTE — Progress Notes (Signed)
Consulting Department:  Primary care physician  Primary Physician:  Plotnikov, Georgina Quint, MD  Chief Complaint: Right upper extremity weakness  History of Present Illness: 11/18/2023 [name] [name] is a 68 y.o. male who presents with the chief complaint of right upper extremity weakness.  He has a history of cervical spondylosis as well as lumbar spondylosis.  He is here today for evaluation of a herniated cervical disc with right sided upper extremity weakness.  He states that this weakness was preceded by severe shoulder pain that kept him from sleeping for multiple days which eventually subsided.  Following this episode he then noticed that he was having difficulty lifting his fingers.  He states that the pain is much better now but he still continues to have significant weakness that slowly worsened and has become a significant deficit.  He is not having any pain currently.    Interval history: He is continue to work with occupational therapy and has regained a significant amount of his motor function in his hand.  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  He is being referred back for possible reinnervation type surgery.  Review of Systems:  A 10 point review of systems is negative, except for the pertinent positives and negatives detailed in the HPI.  Past Medical History: Past Medical History:  Diagnosis Date   Benign localized prostatic hyperplasia with lower urinary tract symptoms (LUTS)    urologist--- dr Annabell Howells   Bladder cancer [facility])    urologist---- dr Annabell Howells---  s/p TURBT 11/2020; 03/ 2022   Coronary artery disease    mild   DDD (degenerative disc disease), cervical    From 1980s   Diabetes mellitus type II    followed by pcp--   (01-05-2021  per pt checks blood sugar three times per week,  fasting sugar --- 130--140_   Difficult intubation 07/28/2012   partial nephrectomy done @[facility]  07-28-2012  found to have anterior larynx , diffcult intubation, referred  to anesthesia record in epic    Dyslipidemia    ED (erectile dysfunction)    Elevated PSA    GERD (gastroesophageal reflux disease)    History of adenomatous polyp of colon    History of kidney stones    History of prostatitis 2008   History of renal carcinoma urologist--- dr Annabell [facility]   07-28-2012 for renal neoplasm  s/p right partal nephrectomy    Hypertension    followed by pcp   Osteoarthritis    Back   Renal carcinoma, right [facility])     Past Surgical History: Past Surgical History:  Procedure Laterality Date   CATARACT EXTRACTION     COLONOSCOPY N/A 03/03/2013   Procedure: COLONOSCOPY;  Surgeon: Hilarie Fredrickson, MD;  Location: [facility];  Service: Endoscopy;  Laterality: N/A;   COLONOSCOPY WITH PROPOFOL N/A 09/29/2019   Procedure: COLONOSCOPY WITH PROPOFOL;  Surgeon: Hilarie Fredrickson, MD;  Location: [facility];  Service: Endoscopy;  Laterality: N/A;   CYSTOSCOPY WITH BIOPSY N/A 12/09/2020   Procedure: CYSTOSCOPY WITH TURBT, BIOPSY AND FULGURATION;  Surgeon: Bjorn Pippin, MD;  Location: [facility];  Service: Urology;  Laterality: N/A;  REQUESTING 45 MINS   EXTRACORPOREAL SHOCK WAVE LITHOTRIPSY  01-10-2015;  03-03-2012  @[facility]    POLYPECTOMY  09/29/2019   Procedure: POLYPECTOMY;  Surgeon: Hilarie Fredrickson, MD;  Location: [facility];  Service: Endoscopy;;   ROBOT ASSISTED LAPAROSCOPIC PARTIAL NEPHRECTOMY  07-28-2012   DR Laverle Patter  @[facility]    TONSILLECTOMY  1965   TRANSURETHRAL  RESECTION OF BLADDER TUMOR WITH MITOMYCIN-C N/A 01/06/2021   Procedure: TRANSURETHRAL RESECTION OF BLADDER TUMOR WITH GEMCITIBINE IN PACU;  Surgeon: Bjorn Pippin, MD;  Location: [facility];  Service: Urology;  Laterality: N/A;   TRANSURETHRAL RESECTION OF BLADDER TUMOR WITH MITOMYCIN-C N/A 09/01/2021   Procedure: TRANSURETHRAL RESECTION OF BLADDER TUMOR WITH POST OPERATIVE INSTILLATION OF GEMCITABINE;  Surgeon: Bjorn Pippin, MD;  Location: [facility];  Service: Urology;  Laterality: N/A;    TRANSURETHRAL RESECTION OF BLADDER TUMOR WITH MITOMYCIN-C Bilateral 11/13/2022   Procedure: CYSTOSCOPY TRANSURETHRAL RESECTION OF BLADDER TUMOR BILATERAL RETROGRADES WITH INSTILL GEMCTABINE;  Surgeon: Bjorn Pippin, MD;  Location: [facility];  Service: Urology;  Laterality: Bilateral;  1 HR FOR CASE    Allergies: Allergies as of 11/18/2023 - Review Complete 11/18/2023  Allergen Reaction Noted   Tamsulosin Other (See Comments)    Codeine Itching and Rash 07/20/2020   Wound dressing adhesive Itching and Other (See Comments) 09/16/2021    Medications:  Current Outpatient Medications:    amLODipine (NORVASC) 5 MG tablet, Take 1 tablet (5 mg total) by mouth daily., Disp: 90 tablet, Rfl: 3   Ascorbic Acid (VITAMIN C) 1000 MG tablet, Take 1,000 mg by mouth once a week., Disp: , Rfl:    blood glucose meter kit and supplies KIT, Use to test blood sugar once a day. DX: E11.9, Disp: 1 each, Rfl: 0   Blood Glucose Monitoring Suppl (CONTOUR NEXT MONITOR) w/Device KIT, 1 Device by Does not apply route daily., Disp: 1 kit, Rfl: 0   cholecalciferol (VITAMIN D) 1000 UNITS tablet, Take 1,000 Units by mouth daily., Disp: , Rfl:    clobetasol (TEMOVATE) 0.05 % external solution, Apply 1 Application topically 2 (two) times daily as needed. Itchy scalp, Disp: 50 mL, Rfl: 3   CONTOUR NEXT TEST test strip, USE AS DIRECTED, Disp: 100 strip, Rfl: 5   cyclobenzaprine (FLEXERIL) 5 MG tablet, TAKE 1 TABLET BY MOUTH THREE TIMES A DAY AS NEEDED FOR MUSCLE SPASMS, Disp: 90 tablet, Rfl: 1   glimepiride (AMARYL) 4 MG tablet, Take 1 tablet (4 mg total) by mouth in the morning and at bedtime., Disp: 180 tablet, Rfl: 3   HYDROcodone-acetaminophen (NORCO) 10-325 MG tablet, Take 1 tablet by mouth 2 (two) times daily as needed for severe pain., Disp: 40 tablet, Rfl: 0   ibuprofen (ADVIL) 800 MG tablet, TAKE 1 TABLET (800 MG TOTAL) BY MOUTH DAILY AS NEEDED FOR MODERATE PAIN., Disp: 30 tablet, Rfl: 0   ketoconazole (NIZORAL) 2 % cream,  Apply 1 Application topically daily as needed for irritation., Disp: , Rfl:    Lancets MISC, Use to check blood sugar once per day., Disp: 100 each, Rfl: 3   metFORMIN (GLUCOPHAGE) 500 MG tablet, Take 1 tablet (500 mg total) by mouth 2 (two) times daily with a meal., Disp: 180 tablet, Rfl: 0   olmesartan (BENICAR) 40 MG tablet, Take 1 tablet (40 mg total) by mouth daily., Disp: 90 tablet, Rfl: 3   potassium chloride SA (KLOR-CON) 20 MEQ tablet, Take 1 tablet (20 mEq total) by mouth daily. (Patient taking differently: Take 20 mEq by mouth 2 (two) times a week.), Disp: 90 tablet, Rfl: 11   repaglinide (PRANDIN) 2 MG tablet, TAKE 1 TABLET BY MOUTH 3 TIMES A DAY BEFORE MEALS, Disp: 270 tablet, Rfl: 3   RYBELSUS 14 MG TABS, Take 1 tablet by mouth daily., Disp: , Rfl:    tadalafil (CIALIS) 5 MG tablet, Take 5 mg by mouth  daily., Disp: , Rfl:  No current facility-administered medications for this visit.  Facility-Administered Medications Ordered in Other Visits:    gemcitabine (GEMZAR) chemo syringe for bladder instillation 2,000 mg, 2,000 mg, Bladder Instillation, Once, Bjorn Pippin, MD   Social History: Social History   Tobacco Use   Smoking status: Every Day    Current packs/day: 0.25    Average packs/day: 0.3 packs/day for 30.0 years (7.5 ttl pk-yrs)    Types: Cigarettes   Smokeless tobacco: Never   Tobacco comments:    1 pack every 2 days  Vaping Use   Vaping status: Never Used  Substance Use Topics   Alcohol use: Not Currently   Drug use: Yes    Types: Marijuana    Comment: Smokes marijuana every day    Family Medical History: Family History  Problem Relation Age of Onset   Lymphoma Mother    Colon cancer Mother 67   Dementia Father    Colon cancer Paternal Aunt    Esophageal cancer Neg Hx    Rectal cancer Neg Hx    Stomach cancer Neg Hx     Physical Examination: Vitals:   11/18/23 1507  BP: (!) 144/84     General: Patient is well developed, well nourished, calm,  collected, and in no apparent distress.  NEUROLOGICAL:  General: In no acute distress.   Awake, alert, oriented to person, place, and time.  Pupils equal round and reactive to light.  Facial tone is symmetric.  Tongue protrusion is midline.  There is no pronator drift.  ROM of spine: full.  Palpation of spine: nontender.    Strength: Deltoid strength is full.  External rotation is full.  Elbow flexion is full, elbow extension is full, wrist extension shows radial deviation and weakness with the ECU but full strength with ECR B/L, he has no evidence of radial innervated finger extensors.  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  He does have some loss of passive range of motion of his joints likely secondary to contracture development.  He has some mild sensory changes in the C7 distribution on the right  Reflexes are 2+ and symmetric at the biceps, triceps, brachioradialis, patella and achilles. Hoffman's is absent.  Clonus is not present.  Toes are down-going.    Gait is normal.  No difficulty with tandem gait.      Patient trying to perform right wrist extension and finger extension simultaneously pictured above  Imaging: Narrative & Impression  CLINICAL DATA:  Neck pain. Chronic degenerative changes. Right arm radicular pain. Right upper extremity pain and weakness with some numbness. Symptoms worsening over the last 2 months.   EXAM: MRI CERVICAL SPINE WITHOUT CONTRAST   TECHNIQUE: Multiplanar, multisequence MR imaging of the cervical spine was performed. No intravenous contrast was administered.   COMPARISON:  Radiography 04/30/2023   FINDINGS: Alignment: Normal   Vertebrae: No fracture or focal bone lesion. No edematous endplate marrow changes or edematous facet arthritis.   Cord: No primary cord lesion.  See below regarding stenosis.   Posterior Fossa, vertebral arteries, paraspinal tissues: Negative   Disc levels:   Foramen magnum, C1-2 and C2-3 levels  are normal.   C3-4: Chronic degenerative spondylosis with endplate osteophytes and mild bulging of the disc. Mild canal narrowing with AP diameter in the midline 8.5 mm. No cord compression. Mild bilateral foraminal narrowing, not visibly compressive.   C4-5: Mild bulging of the disc.  No canal or foraminal stenosis.   C5-6: Spondylosis with endplate  osteophytes and protruding disc material more prominent in the right posterolateral direction. Effacement of the subarachnoid space with indentation of the cord, particularly on the right. AP diameter of the canal in the midline 7.4 mm. Foraminal stenosis right worse than left. In particular, the right C6 nerve root is likely affected.   C6-7: Endplate osteophytes and bulging of the disc more prominent towards the right. No compressive canal stenosis. Moderate foraminal narrowing on the right that could possibly affect the C7 nerve. This is not as severe as the level above.   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   IMPRESSION: 1. 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 2. C5-6: Spondylosis with endplate osteophytes and protruding disc material more prominent in the right posterolateral direction. Effacement of the subarachnoid space with indentation of the cord, particularly on the right. AP diameter of the canal in the midline 7.4 mm. Foraminal stenosis right worse than left. In particular, the right C6 nerve root is likely affected. 3. C6-7: Endplate osteophytes and bulging of the disc more prominent towards the right. Moderate foraminal narrowing on the right that could possibly affect the C7 nerve. This is not as severe as the level above.     Electronically Signed   By: Paulina Fusi M.D.   On: 07/03/2023 17:38        I have personally reviewed the images and agree with the above  interpretation.  Labs:    Latest Ref Rng & Units 02/12/2023    3:09 PM 02/05/2023   10:05 AM 11/02/2022    2:03 PM  CBC  WBC 4.0 - 10.5 K/uL 9.9  8.6  9.0   Hemoglobin 13.0 - 17.0 g/dL 16.1  09.6  04.5   Hematocrit 39.0 - 52.0 % 47.1  45.3  52.7   Platelets 150.0 - 400.0 K/uL 186.0  154.0  141        Assessment and Plan: Mr. Golphin is a pleasant 68 y.o. male with presentation of right upper extremity weakness which is mostly defined by a finger drop without severe wrist drop.  He stated that this came on with a prodrome of severe right shoulder pain that was keeping him from sleeping.  After this shoulder pain wore off he then developed a progressive deficit in his finger extension.  When he first presented back in October he had significant deficits in PIN and AIN innervated musculature, was referred to neurology for evaluation, he has since had occupational therapy and has had an improvement in some of his motor function and overall hand function.  He continues to have a severe deficit in the PIN innervated musculature.  At this point we discussed that given his deficit in his PIN musculature we could perform a decompression and exploration of this nerve at the elbow and could potentially perform a reinnervation type surgery with either an end and/or an end to side nerve transfer based off of what we see in the operating room.  In the interim we will reach out to his occupational therapist, now that he is left with a mostly PIN deficit we will see whether or not he may benefit or qualify for possible PIN type orthosis to help from contracture formation.  He will plan to discuss plans with his wife going forward.  Let him know that he would be a candidate for a PIN decompression and nerve transfer from his supinator  branch.  He will plan to call back and schedule a phone visit when it works for both him and his spouse.  Lovenia Kim, MD/MSCR Dept. of Neurosurgery

## 2023-11-19 ENCOUNTER — Encounter: Payer: Self-pay | Admitting: Occupational Therapy

## 2023-11-19 ENCOUNTER — Other Ambulatory Visit: Payer: Self-pay | Admitting: Urology

## 2023-11-21 NOTE — Progress Notes (Addendum)
 COVID Vaccine received:  []  No [x]  Yes Date of any COVID positive Test in last 90 days: no PCP - Dr. Karlynn Noel Cardiologist - n/a  Chest x-ray - 08/26/23 Epic EKG -   Stress Test -  ECHO -  Cardiac Cath -   Bowel Prep - [x]  No  []   Yes ______  Pacemaker / ICD device [x]  No []  Yes   Spinal Cord Stimulator:[x]  No []  Yes       History of Sleep Apnea? [x]  No []  Yes   CPAP used?- [x]  No []  Yes    Does the patient monitor blood sugar?          [x]  No []  Yes  []  N/A  Patient has: []  NO Hx DM   []  Pre-DM                 []  DM1  [x]   DM2 Does patient have a [employer] or Dexacom? [x]  No []  Yes   Fasting Blood Sugar Ranges- 180-200 Checks Blood Sugar ____2_ times a week  GLP1 agonist / usual dose - no GLP1 instructions:  SGLT-2 inhibitors / usual dose - Rebelsus- hold x 24 hours SGLT-2 instructions:   Blood Thinner / Instructions:no Aspirin  Instructions:no  Comments:   Activity level: Patient is able  to climb a flight of stairs without difficulty; [x]  No CP  [x]  No SOB,    Patient canperform ADLs without assistance.   Anesthesia review:   Patient denies shortness of breath, fever, cough and chest pain at PAT appointment.  Patient verbalized understanding and agreement to the Pre-Surgical Instructions that were given to them at this PAT appointment. Patient was also educated of the need to review these PAT instructions again prior to his/her surgery.I reviewed the appropriate phone numbers to call if they have any and questions or concerns.

## 2023-11-21 NOTE — Patient Instructions (Signed)
SURGICAL WAITING ROOM VISITATION  Patients having surgery or a procedure may have no more than 2 support people in the waiting area - these visitors may rotate.    Children under the age of 44 must have an adult with them who is not the patient.  Due to an increase in RSV and influenza rates and associated hospitalizations, children ages 80 and under may not visit patients in [facility] hospitals.  Visitors with respiratory illnesses are discouraged from visiting and should remain at home.  If the patient needs to stay at the hospital during part of their recovery, the visitor guidelines for inpatient rooms apply. Pre-op nurse will coordinate an appropriate time for 1 support person to accompany patient in pre-op.  This support person may not rotate.    Please refer to the [facility] website for the visitor guidelines for Inpatients (after your surgery is over and you are in a regular room).       Your procedure is scheduled on: 11/27/23   Report to [facility] Main Entrance    Report to admitting at 11 AM   Call this number if you have problems the morning of surgery [phone]   Do not eat food or drink liquids:After Midnight. Except sips water  to take meds       Oral Hygiene is also important to reduce your risk of infection.                                    Remember - BRUSH YOUR TEETH THE MORNING OF SURGERY WITH YOUR REGULAR TOOTHPASTE  DENTURES WILL BE REMOVED PRIOR TO SURGERY PLEASE DO NOT APPLY Poly grip OR ADHESIVES!!!   Stop all vitamins and herbal supplements 7 days before surgery.   Take these medicines the morning of surgery with A SIP OF WATER : Amlodipine   DO NOT TAKE ANY ORAL DIABETIC MEDICATIONS DAY OF YOUR SURGERY Don not take Glimepiride  the morning of surgery. Do not take Metformin  the morning of surgery. Do not take Prandin  the morning of surgery. Hold Rebelsus for 24hours before surgery.             You may not have any metal on your  body including hair pins, jewelry, and body piercing             Do not wear make-up, lotions, powders, perfumes/cologne, or deodorant              Men may shave face and neck.   Do not bring valuables to the hospital. [facility] IS NOT             RESPONSIBLE   FOR VALUABLES.   Contacts, glasses, dentures or bridgework may not be worn into surgery.  DO NOT BRING YOUR HOME MEDICATIONS TO THE HOSPITAL. PHARMACY WILL DISPENSE MEDICATIONS LISTED ON YOUR MEDICATION LIST TO YOU DURING YOUR ADMISSION IN THE HOSPITAL!    Patients discharged on the day of surgery will not be allowed to drive home.  Someone NEEDS to stay with you for the first 24 hours after anesthesia.   Special Instructions: Bring a copy of your healthcare power of attorney and living will documents the day of surgery if you haven't scanned them before.              Please read over the following fact sheets you were given: IF YOU HAVE QUESTIONS ABOUT YOUR PRE-OP INSTRUCTIONS PLEASE CALL  [phone]   If you received a COVID test during your pre-op visit  it is requested that you wear a mask when out in public, stay away from anyone that may not be feeling well and notify your surgeon if you develop symptoms. If you test positive for Covid or have been in contact with anyone that has tested positive in the last 10 days please notify you surgeon.    [facility] - Preparing for Surgery Before surgery, you can play an important role.  Because skin is not sterile, your skin needs to be as free of germs as possible.  You can reduce the number of germs on your skin by washing with CHG (chlorahexidine gluconate) soap before surgery.  CHG is an antiseptic cleaner which kills germs and bonds with the skin to continue killing germs even after washing. Please DO NOT use if you have an allergy to CHG or antibacterial soaps.  If your skin becomes reddened/irritated stop using the CHG and inform your nurse when you arrive at Short  Stay. Do not shave (including legs and underarms) for at least 48 hours prior to the first CHG shower.  You may shave your face/neck.  Please follow these instructions carefully:  1.  Shower with CHG Soap the night before surgery and the  morning of surgery.  2.  If you choose to wash your hair, wash your hair first as usual with your normal  shampoo.  3.  After you shampoo, rinse your hair and body thoroughly to remove the shampoo.                             4.  Use CHG as you would any other liquid soap.  You can apply chg directly to the skin and wash.  Gently with a scrungie or clean washcloth.  5.  Apply the CHG Soap to your body ONLY FROM THE NECK DOWN.   Do   not use on face/ open                           Wound or open sores. Avoid contact with eyes, ears mouth and   genitals (private parts).                       Wash face,  Genitals (private parts) with your normal soap.             6.  Wash thoroughly, paying special attention to the area where your    surgery  will be performed.  7.  Thoroughly rinse your body with warm water  from the neck down.  8.  DO NOT shower/wash with your normal soap after using and rinsing off the CHG Soap.                9.  Pat yourself dry with a clean towel.            10.  Wear clean pajamas.            11.  Place clean sheets on your bed the night of your first shower and do not  sleep with pets. Day of Surgery : Do not apply any lotions/deodorants the morning of surgery.  Please wear clean clothes to the hospital/surgery center.  FAILURE TO FOLLOW THESE INSTRUCTIONS MAY RESULT IN THE CANCELLATION OF YOUR SURGERY  PATIENT SIGNATURE_________________________________  NURSE SIGNATURE__________________________________  ________________________________________________________________________ 

## 2023-11-22 ENCOUNTER — Encounter (HOSPITAL_COMMUNITY): Payer: Self-pay

## 2023-11-22 ENCOUNTER — Encounter (HOSPITAL_COMMUNITY)
Admission: RE | Admit: 2023-11-22 | Discharge: 2023-11-22 | Disposition: A | Discharge status: Home / Self Care | Payer: Medicare Other | Source: Ambulatory Visit | Attending: Urology | Admitting: Urology

## 2023-11-22 ENCOUNTER — Other Ambulatory Visit: Payer: Self-pay

## 2023-11-22 VITALS — Ht 66.0 in | Wt 180.0 lb

## 2023-11-22 DIAGNOSIS — I10 Essential (primary) hypertension: Secondary | ICD-10-CM | POA: Diagnosis not present

## 2023-11-22 DIAGNOSIS — E118 Type 2 diabetes mellitus with unspecified complications: Secondary | ICD-10-CM | POA: Insufficient documentation

## 2023-11-22 DIAGNOSIS — Z01812 Encounter for preprocedural laboratory examination: Secondary | ICD-10-CM | POA: Diagnosis not present

## 2023-11-22 HISTORY — DX: Myoneural disorder, unspecified: G70.9

## 2023-11-22 LAB — HEMOGLOBIN A1C
Hgb A1c MFr Bld: 8.8 % — ABNORMAL HIGH (ref 4.8–5.6)
Mean Plasma Glucose: 205.86 mg/dL

## 2023-11-22 LAB — BASIC METABOLIC PANEL
Anion gap: 8 (ref 5–15)
BUN: 13 mg/dL (ref 8–23)
CO2: 25 mmol/L (ref 22–32)
Calcium: 9.4 mg/dL (ref 8.9–10.3)
Chloride: 106 mmol/L (ref 98–111)
Creatinine, Ser: 0.75 mg/dL (ref 0.61–1.24)
GFR, Estimated: >60 mL/min (ref >60)
Glucose, Bld: 213 mg/dL — ABNORMAL HIGH (ref 70–99)
Potassium: 2.9 mmol/L — ABNORMAL LOW (ref 3.5–5.1)
Sodium: 139 mmol/L (ref 135–145)

## 2023-11-22 LAB — CBC
HCT: 47.5 % (ref 39.0–52.0)
Hemoglobin: 16.3 g/dL (ref 13.0–17.0)
MCH: 29 pg (ref 26.0–34.0)
MCHC: 34.3 g/dL (ref 30.0–36.0)
MCV: 84.4 fL (ref 80.0–100.0)
Platelets: 163 10*3/uL (ref 150–400)
RBC: 5.63 MIL/uL (ref 4.22–5.81)
RDW: 13.8 % (ref 11.5–15.5)
WBC: 7.8 10*3/uL (ref 4.0–10.5)
nRBC: 0 % (ref 0.0–0.2)

## 2023-11-22 NOTE — Progress Notes (Signed)
 Please review  pre op BMP results from 11/22/23.

## 2023-11-22 NOTE — Progress Notes (Signed)
 Pt. Unable to make it to appointment on time. Called pt. At home to do PST interview. Positive ID made using 2 identifiers. Pt. Answered all questions. Pre op instructions given and pt's questions answered. He will come today at 2:30PM for labs.

## 2023-11-25 NOTE — Progress Notes (Signed)
 COVID Vaccine received:  []  No [x]  Yes Date of any COVID positive Test in last 90 days: no PCP - Dr. Karlynn Noel Cardiologist - n/a  Chest x-ray - 08/26/23 Epic EKG -   Stress Test -  ECHO -  Cardiac Cath -   Bowel Prep - [x]  No  []   Yes ______  Pacemaker / ICD device [x]  No []  Yes   Spinal Cord Stimulator:[x]  No []  Yes       History of Sleep Apnea? [x]  No []  Yes   CPAP used?- [x]  No []  Yes    Does the patient monitor blood sugar?          [x]  No []  Yes  []  N/A  Patient has: []  NO Hx DM   []  Pre-DM                 []  DM1  [x]   DM2 Does patient have a [employer] or Dexacom? [x]  No []  Yes   Fasting Blood Sugar Ranges- 180-200 Checks Blood Sugar ____2_ times a week  GLP1 agonist / usual dose - no GLP1 instructions:  SGLT-2 inhibitors / usual dose - Rebelsus- hold x 24 hours SGLT-2 instructions:   Blood Thinner / Instructions:no Aspirin  Instructions:no  Comments:   Activity level: Patient is able  to climb a flight of stairs without difficulty; [x]  No CP  [x]  No SOB,    Patient canperform ADLs without assistance.   Anesthesia review: DM, HTN, CAD,Aortic athersclerosis  Patient denies shortness of breath, fever, cough and chest pain at PAT appointment.  Patient verbalized understanding and agreement to the Pre-Surgical Instructions that were given to them at this PAT appointment. Patient was also educated of the need to review these PAT instructions again prior to his/her surgery.I reviewed the appropriate phone numbers to call if they have any and questions or concerns.

## 2023-11-25 NOTE — Progress Notes (Signed)
 PLease review pre op BMP and HgbA1c from 11/22/23.

## 2023-11-27 ENCOUNTER — Encounter (HOSPITAL_COMMUNITY): Payer: Self-pay | Admitting: Urology

## 2023-11-27 ENCOUNTER — Other Ambulatory Visit: Payer: Self-pay

## 2023-11-27 ENCOUNTER — Ambulatory Visit (HOSPITAL_BASED_OUTPATIENT_CLINIC_OR_DEPARTMENT_OTHER): Payer: Medicare Other | Admitting: Anesthesiology

## 2023-11-27 ENCOUNTER — Encounter (HOSPITAL_COMMUNITY)
Admission: RE | Disposition: A | Discharge status: Home / Self Care | Payer: Self-pay | Source: Home / Self Care | Attending: Urology

## 2023-11-27 ENCOUNTER — Ambulatory Visit (HOSPITAL_COMMUNITY): Payer: Medicare Other

## 2023-11-27 ENCOUNTER — Ambulatory Visit (HOSPITAL_COMMUNITY): Payer: Medicare Other | Admitting: Physician Assistant

## 2023-11-27 ENCOUNTER — Ambulatory Visit (HOSPITAL_COMMUNITY)
Admission: RE | Admit: 2023-11-27 | Discharge: 2023-11-27 | Disposition: A | Discharge status: Home / Self Care | Payer: Medicare Other | Attending: Urology | Admitting: Urology

## 2023-11-27 DIAGNOSIS — E119 Type 2 diabetes mellitus without complications: Secondary | ICD-10-CM

## 2023-11-27 DIAGNOSIS — N3289 Other specified disorders of bladder: Secondary | ICD-10-CM | POA: Diagnosis not present

## 2023-11-27 DIAGNOSIS — N401 Enlarged prostate with lower urinary tract symptoms: Secondary | ICD-10-CM | POA: Diagnosis not present

## 2023-11-27 DIAGNOSIS — F17210 Nicotine dependence, cigarettes, uncomplicated: Secondary | ICD-10-CM | POA: Diagnosis not present

## 2023-11-27 DIAGNOSIS — N210 Calculus in bladder: Secondary | ICD-10-CM | POA: Insufficient documentation

## 2023-11-27 DIAGNOSIS — I10 Essential (primary) hypertension: Secondary | ICD-10-CM | POA: Diagnosis not present

## 2023-11-27 DIAGNOSIS — I2510 Atherosclerotic heart disease of native coronary artery without angina pectoris: Secondary | ICD-10-CM | POA: Diagnosis not present

## 2023-11-27 DIAGNOSIS — Z08 Encounter for follow-up examination after completed treatment for malignant neoplasm: Secondary | ICD-10-CM | POA: Insufficient documentation

## 2023-11-27 DIAGNOSIS — N329 Bladder disorder, unspecified: Secondary | ICD-10-CM | POA: Diagnosis not present

## 2023-11-27 DIAGNOSIS — N320 Bladder-neck obstruction: Secondary | ICD-10-CM | POA: Insufficient documentation

## 2023-11-27 DIAGNOSIS — Z8551 Personal history of malignant neoplasm of bladder: Secondary | ICD-10-CM | POA: Insufficient documentation

## 2023-11-27 DIAGNOSIS — E118 Type 2 diabetes mellitus with unspecified complications: Secondary | ICD-10-CM

## 2023-11-27 HISTORY — PX: TRANSURETHRAL RESECTION OF BLADDER TUMOR: SHX2575

## 2023-11-27 LAB — GLUCOSE, CAPILLARY
Glucose-Capillary: 204 mg/dL — ABNORMAL HIGH (ref 70–99)
Glucose-Capillary: 158 mg/dL — ABNORMAL HIGH (ref 70–99)
Glucose-Capillary: 125 mg/dL — ABNORMAL HIGH (ref 70–99)

## 2023-11-27 SURGERY — TURBT (TRANSURETHRAL RESECTION OF BLADDER TUMOR)
Anesthesia: General

## 2023-11-27 MED ORDER — PROPOFOL 10 MG/ML IV BOLUS
INTRAVENOUS | Status: AC
  Filled 2023-11-27: qty 20

## 2023-11-27 MED ORDER — PHENYLEPHRINE 80 MCG/ML (10ML) SYRINGE FOR IV PUSH (FOR BLOOD PRESSURE SUPPORT)
PREFILLED_SYRINGE | INTRAVENOUS | Status: AC
  Filled 2023-11-27: qty 10

## 2023-11-27 MED ORDER — ORAL CARE MOUTH RINSE: 15.0000 mL | Freq: Once | OROMUCOSAL | Status: AC

## 2023-11-27 MED ORDER — LIDOCAINE 2% (20 MG/ML) 5 ML SYRINGE
INTRAMUSCULAR | Status: DC | PRN
  Administered 2023-11-27: 100 mg via INTRAVENOUS

## 2023-11-27 MED ORDER — MIDAZOLAM HCL 5 MG/5ML IJ SOLN
INTRAMUSCULAR | Status: DC | PRN
  Administered 2023-11-27: 2 mg via INTRAVENOUS

## 2023-11-27 MED ORDER — ROCURONIUM BROMIDE 10 MG/ML (PF) SYRINGE
PREFILLED_SYRINGE | INTRAVENOUS | Status: AC
  Filled 2023-11-27: qty 10

## 2023-11-27 MED ORDER — LIDOCAINE HCL (PF) 2 % IJ SOLN
INTRAMUSCULAR | Status: AC
  Filled 2023-11-27: qty 5

## 2023-11-27 MED ORDER — ROCURONIUM BROMIDE 10 MG/ML (PF) SYRINGE
PREFILLED_SYRINGE | INTRAVENOUS | Status: DC | PRN
  Administered 2023-11-27: 30 mg via INTRAVENOUS

## 2023-11-27 MED ORDER — LACTATED RINGERS IV SOLN: INTRAVENOUS | Status: DC | PRN

## 2023-11-27 MED ORDER — SUCCINYLCHOLINE CHLORIDE 200 MG/10ML IV SOSY
PREFILLED_SYRINGE | INTRAVENOUS | Status: DC | PRN
  Administered 2023-11-27: 140 mg via INTRAVENOUS

## 2023-11-27 MED ORDER — PHENYLEPHRINE 80 MCG/ML (10ML) SYRINGE FOR IV PUSH (FOR BLOOD PRESSURE SUPPORT)
PREFILLED_SYRINGE | INTRAVENOUS | Status: DC | PRN
  Administered 2023-11-27 (×2): 80 ug via INTRAVENOUS
  Administered 2023-11-27: 160 ug via INTRAVENOUS
  Administered 2023-11-27 (×2): 80 ug via INTRAVENOUS

## 2023-11-27 MED ORDER — DEXAMETHASONE SODIUM PHOSPHATE 10 MG/ML IJ SOLN
INTRAMUSCULAR | Status: AC
  Filled 2023-11-27: qty 1

## 2023-11-27 MED ORDER — EPHEDRINE 5 MG/ML INJ
INTRAVENOUS | Status: AC
  Filled 2023-11-27: qty 5

## 2023-11-27 MED ORDER — PROPOFOL 10 MG/ML IV BOLUS
INTRAVENOUS | Status: DC | PRN
  Administered 2023-11-27: 120 mg via INTRAVENOUS

## 2023-11-27 MED ORDER — OXYCODONE HCL 5 MG PO TABS: 5.0000 mg | ORAL_TABLET | Freq: Once | ORAL | Status: DC | PRN

## 2023-11-27 MED ORDER — ONDANSETRON HCL 4 MG/2ML IJ SOLN
INTRAMUSCULAR | Status: AC
  Filled 2023-11-27: qty 2

## 2023-11-27 MED ORDER — INSULIN ASPART 100 UNIT/ML IJ SOLN
0.0000 [IU] | INTRAMUSCULAR | Status: AC | PRN
  Administered 2023-11-27: 2 [IU] via SUBCUTANEOUS
  Filled 2023-11-27: qty 1

## 2023-11-27 MED ORDER — ONDANSETRON HCL 4 MG/2ML IJ SOLN
INTRAMUSCULAR | Status: DC | PRN
  Administered 2023-11-27: 4 mg via INTRAVENOUS

## 2023-11-27 MED ORDER — LABETALOL HCL 5 MG/ML IV SOLN
INTRAVENOUS | Status: DC | PRN
  Administered 2023-11-27: 5 mg via INTRAVENOUS

## 2023-11-27 MED ORDER — FENTANYL CITRATE (PF) 100 MCG/2ML IJ SOLN
INTRAMUSCULAR | Status: AC
  Filled 2023-11-27: qty 2

## 2023-11-27 MED ORDER — LACTATED RINGERS IV SOLN: INTRAVENOUS | Status: DC

## 2023-11-27 MED ORDER — ONDANSETRON HCL 4 MG/2ML IJ SOLN: 4.0000 mg | Freq: Four times a day (QID) | INTRAMUSCULAR | Status: DC | PRN

## 2023-11-27 MED ORDER — OXYCODONE HCL 5 MG/5ML PO SOLN: 5.0000 mg | Freq: Once | ORAL | Status: DC | PRN

## 2023-11-27 MED ORDER — CHLORHEXIDINE GLUCONATE 0.12 % MT SOLN
15.0000 mL | Freq: Once | OROMUCOSAL | Status: AC
  Administered 2023-11-27: 15 mL via OROMUCOSAL

## 2023-11-27 MED ORDER — SUCCINYLCHOLINE CHLORIDE 200 MG/10ML IV SOSY
PREFILLED_SYRINGE | INTRAVENOUS | Status: AC
  Filled 2023-11-27: qty 10

## 2023-11-27 MED ORDER — FENTANYL CITRATE PF 50 MCG/ML IJ SOSY: 25.0000 ug | PREFILLED_SYRINGE | INTRAMUSCULAR | Status: DC | PRN

## 2023-11-27 MED ORDER — EPHEDRINE SULFATE-NACL 50-0.9 MG/10ML-% IV SOSY
PREFILLED_SYRINGE | INTRAVENOUS | Status: DC | PRN
  Administered 2023-11-27: 10 mg via INTRAVENOUS

## 2023-11-27 MED ORDER — IOHEXOL 300 MG/ML  SOLN
INTRAMUSCULAR | Status: DC | PRN
  Administered 2023-11-27: 13 mL

## 2023-11-27 MED ORDER — INSULIN ASPART 100 UNIT/ML IJ SOLN
INTRAMUSCULAR | Status: AC
  Administered 2023-11-27: 4 [IU] via SUBCUTANEOUS
  Filled 2023-11-27: qty 1

## 2023-11-27 MED ORDER — DEXAMETHASONE SODIUM PHOSPHATE 10 MG/ML IJ SOLN
INTRAMUSCULAR | Status: DC | PRN
  Administered 2023-11-27: 5 mg via INTRAVENOUS

## 2023-11-27 MED ORDER — MIDAZOLAM HCL 2 MG/2ML IJ SOLN
INTRAMUSCULAR | Status: AC
  Filled 2023-11-27: qty 2

## 2023-11-27 MED ORDER — FENTANYL CITRATE (PF) 100 MCG/2ML IJ SOLN
INTRAMUSCULAR | Status: DC | PRN
  Administered 2023-11-27: 100 ug via INTRAVENOUS

## 2023-11-27 MED ORDER — SODIUM CHLORIDE 0.9 % IR SOLN
Status: DC | PRN
  Administered 2023-11-27: 3000 mL

## 2023-11-27 MED ORDER — HYDROCODONE-ACETAMINOPHEN 10-325 MG PO TABS: 1.0000 | ORAL_TABLET | Freq: Four times a day (QID) | ORAL | 0 refills | Status: DC | PRN

## 2023-11-27 MED ORDER — SODIUM CHLORIDE 0.9% FLUSH: 3.0000 mL | Freq: Two times a day (BID) | INTRAVENOUS | Status: DC

## 2023-11-27 MED ORDER — SUGAMMADEX SODIUM 200 MG/2ML IV SOLN
INTRAVENOUS | Status: DC | PRN
  Administered 2023-11-27: 200 mg via INTRAVENOUS

## 2023-11-27 MED ORDER — SODIUM CHLORIDE 0.9 % IV SOLN
2.0000 g | INTRAVENOUS | Status: AC
  Administered 2023-11-27: 2 g via INTRAVENOUS
  Filled 2023-11-27: qty 20

## 2023-11-27 SURGICAL SUPPLY — 19 items
BAG URINE DRAIN 2000ML AR STRL (UROLOGICAL SUPPLIES) IMPLANT
BAG URO CATCHER STRL LF (MISCELLANEOUS) ×1 IMPLANT
CATH FOLEY 3WAY 30CC 22FR (CATHETERS) IMPLANT
CATH URETL OPEN 5X70 (CATHETERS) IMPLANT
DRAPE FOOT SWITCH (DRAPES) ×1 IMPLANT
ELECT REM PT RETURN 15FT ADLT (MISCELLANEOUS) ×1 IMPLANT
GLOVE SURG SS PI 8.0 STRL IVOR (GLOVE) ×1 IMPLANT
GOWN STRL REUS W/ TWL XL LVL3 (GOWN DISPOSABLE) ×1 IMPLANT
HOLDER FOLEY CATH W/STRAP (MISCELLANEOUS) IMPLANT
KIT TURNOVER KIT A (KITS) IMPLANT
LOOP CUT BIPOLAR 24F LRG (ELECTROSURGICAL) IMPLANT
MANIFOLD NEPTUNE II (INSTRUMENTS) ×1 IMPLANT
PACK CYSTO (CUSTOM PROCEDURE TRAY) ×1 IMPLANT
SUT ETHILON 3 0 PS 1 (SUTURE) IMPLANT
SYR 30ML LL (SYRINGE) IMPLANT
SYR TOOMEY IRRIG 70ML (MISCELLANEOUS)
SYRINGE TOOMEY IRRIG 70ML (MISCELLANEOUS) IMPLANT
TUBING CONNECTING 10 (TUBING) ×1 IMPLANT
TUBING UROLOGY SET (TUBING) ×1 IMPLANT

## 2023-11-27 NOTE — Anesthesia Preprocedure Evaluation (Addendum)
Anesthesia Evaluation  Patient identified by MRN, date of birth, ID band Patient awake    Reviewed: Allergy & Precautions, H&P , NPO status , Patient's Chart, lab work & pertinent test results  History of Anesthesia Complications (+) DIFFICULT AIRWAY and history of anesthetic complications  Airway Mallampati: II   Neck ROM: full    Dental   Pulmonary Current Smoker   breath sounds clear to auscultation       Cardiovascular hypertension, + CAD   Rhythm:regular Rate:Normal     Neuro/Psych  Headaches  Neuromuscular disease    GI/Hepatic ,GERD  ,,  Endo/Other  diabetes, Type 2    Renal/GU    Bladder tumor    Musculoskeletal  (+) Arthritis ,    Abdominal   Peds  Hematology   Anesthesia Other Findings   Reproductive/Obstetrics                             Anesthesia Physical Anesthesia Plan  ASA: 3  Anesthesia Plan: General   Post-op Pain Management:    Induction: Intravenous  PONV Risk Score and Plan: 1 and Ondansetron, Dexamethasone, Midazolam and Treatment may vary due to age or medical condition  Airway Management Planned: Oral ETT and Video Laryngoscope Planned  Additional Equipment:   Intra-op Plan:   Post-operative Plan: Extubation in OR  Informed Consent: I have reviewed the patients History and Physical, chart, labs and discussed the procedure including the risks, benefits and alternatives for the proposed anesthesia with the patient or authorized representative who has indicated his/her understanding and acceptance.     Dental advisory given  Plan Discussed with: CRNA, Anesthesiologist and Surgeon  Anesthesia Plan Comments:        Anesthesia Quick Evaluation

## 2023-11-27 NOTE — Transfer of Care (Signed)
Immediate Anesthesia Transfer of Care Note  Patient: [name] [name]  Procedure(s) Performed: TRANSURETHRAL RESECTION OF BLADDER TUMOR (TURBT), BILATERAL RETROGRADE  Patient Location: PACU  Anesthesia Type:General  Level of Consciousness: awake, alert , and oriented  Airway & Oxygen Therapy: Patient Spontanous Breathing and Patient connected to face mask oxygen  Post-op Assessment: Report given to RN and Post -op Vital signs reviewed and stable  Post vital signs: Reviewed and stable  Last Vitals:  Vitals Value Taken Time  BP 143/81 11/27/23 1427  Temp    Pulse 72 11/27/23 1429  Resp 20 11/27/23 1429  SpO2 97 % 11/27/23 1429  Vitals shown include unfiled device data.  Last Pain:  Vitals:   11/27/23 1131  TempSrc:   PainSc: 0-No pain      Patients Stated Pain Goal: 4 (11/27/23 1131)  Complications: No notable events documented.

## 2023-11-27 NOTE — Op Note (Signed)
Procedure: 1.  Cystoscopy with bilateral retrograde pyelograms and interpretation. 2.  Cystoscopy with biopsy and fulguration of 8 mm right posterior lateral wall lesion. 3.  Application of fluoroscopy. 4.  Cystoscopy with removal of small bladder stones.  Preop diagnosis: 1.  History of bladder cancer with erythematous bladder wall lesion.Marland Kitchen   Postop diagnosis: 1.  History of bladder cancer with erythematous right posterior lateral wall bladder lesion approximately 8 mm in size. 2.  Bladder stones.  Surgeon: Dr. Bjorn Pippin.  Anesthesia: General.  Specimen: Bladder biopsies.  EBL: None.  Drains: None.  Complications: None.  Indications: Patient is a 68 year old male with a history recurrent urothelial carcinoma of the bladder who was recently found on office cystoscopy to have concerning lesions of the bladder wall and was felt require biopsy as well as retrograde pyelography.  Procedure: He was taken operating room was given 2 g of Ancef.  A general anesthetic was induced.  He is placed in lithotomy position and fitted with PAS hose.  His perineum and genitalia were prepped Betadine solution he was draped in usual sterile fashion.  Cystoscopy was performed using a 21 [place] scope and 30 degree lens.  Examination really normal urethra.  The external sphincter was intact.  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  Examination of bladder revealed mild trabeculation.  There was a stellate scar on the floor of the bladder and there was a stellate scar on the right anterior lateral wall with an erythematous lesion approximately 8 mm in size with some velvety mucosa on the superior margin of the scar.  No other bladder wall lesions were noted but there were some small stones in the bladder that were evacuated through the cystoscope.  Ureteral orifices were unremarkable.  After completion of cystoscopy a bilateral retrograde pyelogram  was performed with a 5 [place] open-ended catheter and Omnipaque.  The right retrograde pyelogram demonstrated J hooking of the distal ureter but no filling defects or dilation of the ureter or the collecting system.  The left retrograde pyelogram demonstrated J hooking of the distal ureter but no filling defects or dilation of the ureter or collecting system.  After completion of the retrograde pyelograms, the cystoscope was used with a flexible biopsy forceps to biopsy the lesion on the right anterior lateral wall.  2 specimens were obtained from the lesion.  Once the biopsies had been performed, the urethra was calibrated to 30 [place] with [employer] and the 26 French continuous-flow resectoscope sheath was inserted.  The visual obturator was removed and was replaced with an Wandra Scot handle with a bipolar loop.  Saline was used as the irrigant.  The biopsy sites were then generously fulgurated along with the surrounding rim of mucosa for an area of fulguration approximately 1.5 cm in diameter.  The bladder was then drained and the scope was removed.  He was taken down from lithotomy position, his anesthetic was reversed and he was moved recovery in stable condition.  There were no complications.

## 2023-11-27 NOTE — Interval H&P Note (Signed)
History and Physical Interval Note:  11/27/2023 1:09 PM  [name] [name]  has presented today for surgery, with the diagnosis of BLADDER LESIONS.  The various methods of treatment have been discussed with the patient and family. After consideration of risks, benefits and other options for treatment, the patient has consented to  Procedure(s) with comments: TRANSURETHRAL RESECTION OF BLADDER TUMOR (TURBT), BILATERAL RETROGRADE (N/A) - 60 MINUTE CASE as a surgical intervention.  The patient's history has been reviewed, patient examined, no change in status, stable for surgery.  I have reviewed the patient's chart and labs.  Questions were answered to the patient's satisfaction.     Bjorn Pippin

## 2023-11-27 NOTE — Anesthesia Procedure Notes (Signed)
Procedure Name: Intubation Date/Time: 11/27/2023 1:44 PM  Performed by: Cleda Clarks, CRNAPre-anesthesia Checklist: Patient identified, Emergency Drugs available, Suction available and Patient being monitored Patient Re-evaluated:Patient Re-evaluated prior to induction Oxygen Delivery Method: Circle system utilized Preoxygenation: Pre-oxygenation with 100% oxygen Induction Type: IV induction Ventilation: Mask ventilation without difficulty Laryngoscope Size: Glidescope and 3 Tube type: Oral Tube size: 7.0 mm Number of attempts: 1 Airway Equipment and Method: Stylet and Video-laryngoscopy Placement Confirmation: ETT inserted through vocal cords under direct vision, positive ETCO2 and breath sounds checked- equal and bilateral Secured at: 22 cm Tube secured with: Tape Dental Injury: Teeth and Oropharynx as per pre-operative assessment

## 2023-11-27 NOTE — H&P (Signed)
I have bladder cancer.  HPI: [name] [name] is a 68 year-old male established patient who is here for bladder cancer.    1/31/25Loraine [name] returns today in f/u for his history of bladder cancer. He had his last maintenance BCG on 07/03/23. He has stopped alfuzosin and finasteride for BPH with bOO for a lack of benefit but he does remain on tadalafil for ED. His primary complaint is urgency with UUI. His IPSS is 5 with nocturia x 1.   05/15/23: [name] [name] returns today in f/u for his history of bladder cancer. 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 He last had maintenance BCG in 1/24. He also has a history of stone, UTI's and BPH with BOO and an elevated PSA that was up to 5.2 on 02/20/23 from 3.3 in 6/23. He is on alfuzosin and finasteride along with tadalafil for ED. He had a CT in May for right flank pain and was found to have small renal stones. 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 No mets were noted. He had some small bladder stones. His prostate is markedly enlarged. UA is clear today. He was given antibiotics in May but his culture was Mx species. He has right upper arm and shoulder pain from cervical disc disease.   03/15/2023: 68 year old man who presents today with back pain and hematuria. He does have a history of kidney stones. As well as UTI and bladder cancer. He finished his antibiotics for UTI a few days ago. He states his back pain has been present for about 3 weeks and does improve with ibuprofen. He denies fevers and chills. He denies changes to his voiding habits. He states his back pain is mostly on his right side and radiates down into the right hip. Movement does not make it worse. There is no tenderness to palpation. The pain is similar to what he describes as kidney stone pain.   5/22/24Loraine [name] returns today in f/u. he was to have surveillance cystoscopy but his UA looks infected today. He had a G+ organism on  culture in 2/24 and had macrobid sent but doesn't recall taking it. He has no dysuria or hematuria. His IPSS is 4 with nocturia x 2. He had some dysuria at his last visit. He remains on finasteride and alfuzosin for the BPH but his PSA is up to 5.3 from 3.33. He remains on daily tadalafil which may help the ED some. He had ejaculatory dysfunction with tamsulosin.   2/72/4Loraine [name] returns today in f/u from his recent procedure. He had inflammation on the biopsy and his RTG's were negative. He remains on finasteride and alfuzosin and is voiding better with and IPSS of 7. He has nocturia x 2 and some urgency. He has had some intermittent hematuria since the procedure with the last episode about 2 days ago. His UA has a few WBC and RBC's. He has slight dysuria.   12/28/23Loraine [name] returns today in f/u for cystoscopy. He last had maintenance BCG in 7/23. He missed his last appoint because of cataract surgery. He has had no hematuria. He remains on alfuzosin, finasteride and tadalafil. He has nocturia 1-2x which is better. 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   03/26/22: [name] [name] returns today in f/u for surveillance cystoscopy. He last had maintenance BCG on 01/23/22. He remains on afluzosin and finasteride as well as daily tadalafil. He is voiding well with an  IPSS of 9. He has bothersome post void dribbling. His UA is clear but has glucosuria. His PSA is 3.33 which is stable. His last upper tract imaging was a CT in 11/22.   12/22/21: [name] [name] returns today for surveillance cystoscopy. 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 He had minimal systemic side effects. He remains on finasteride and alfuzosin for his history of BPH with BOO. He is on daily tadalafil as well for ED and LUTS. His IPSS is 9 with nocturia x 3.   09/15/21: [name] [name] returns today in f/u from his recent procedure. He was found to have T1 HG NMIBC of the dome and lateral wall. There was muscle in  the specimen and it was not involved. He is doing well without complaints.   11/9/22Loraine [name] returns today in f/u. He had a CT prior to this visit that showed no concerning findings. He has a large prostate with asymmetric prostatomegaly that is stable since 2015. His PSA is 2.6 which is down from the last. He remains on finasteride and alfuzosin.   10/10/22Loraine [name] returns today in f/u. He had some difficulty urinating for 3 days last week and then he passed a pinkish mass of tissue about 2cm when voiding. His voiding symptoms have abated. His IPSS is 11 with some urgency and a sensation of incomplete emptying. His UA is clear today. He is scheduled for cystoscopy on 08/16/21.   05/19/21: [name] [name] returns for surveillance cystoscopy for his history of T1 HG NMIBC bladder cancer with completion of induction BCG on 03/14/21. He has no hematuria and his UA is clear today. He has some urgency and nocturia x 1 but no frequency or obstructive symptoms. He has no dysuria. He remains on finasteride. He tried an OTC supplement but it interfered with the Cialis. his PSA was up slightly to 3.87 on 5/23 but that was at the end of the BCG treatments. he has not had a prostate biopsy in the past. he was on the finasteride sporadically.   4/4/22Loraine [name] returns today in f/u from a restaging TURBT. His path was negative and muscle was in the specimen. His IPSS is 11. He has some urgency. His UA has microhematuria.    3/11/22Loraine [name] returns today in f/u. He was found on recent TURBT to have a T1 HG NMIBC of the anterior wall of the bladder. There was dysplasia of the right lateral wall and inflammation of the bladder neck with Von Brunn's nests. Muscle was present in the anterior specimen but not involved. He is scheduled for a restaging TURBT on 01/06/21. He has no gross hematuria but has some microhematuria today. He is having some dysuria. He is otherwise voiding without complaints.     CC: AUA Questions Scoring.  HPI:     AUA  Symptom Score: Less than 20% of the time he has the sensation of not emptying his bladder completely when finished urinating. He never has to urinate again less that two hours after he has finished urinating. He does not have to stop and start again several times when he urinates. Less than 50% of the time he finds it difficult to postpone urination. Less than 20% of the time he has a weak urinary stream. He never has to push or strain to begin urination. He has to get up to urinate 1 time from the time he goes to bed until the time he gets up in the morning.  Calculated AUA Symptom Score: 5    ALLERGIES: Codeine Flomax CAPS Ondansetron HCl TABS    MEDICATIONS: Cialis 5 mg tablet 1 tablet PO Daily PRN  Amlodipine Besilate  Glimepiride  Hydrocodone-Acetaminophen 10 mg-325 mg tablet  Klor-Con  Olmesartan Medoxomil  Rybelsus  Vitamin C  Vitamin D3     GU PSH: Bladder Instill AntiCA Agent - 07/03/2023, 06/19/2023, 06/05/2023, 11/13/2022, 04/24/2022, 04/10/2022, 04/03/2022, 01/23/2022, 01/16/2022, 01/09/2022, 2023, 2023, 2023, 2023, 10/13/2021, 10/06/2021, 2022, 2022, 2022, 2022, 2022, 2022, 2022 Complex Uroflow - 05/15/2023 Cysto Remove Stent FB Sim - 09/01/2021 Cystoscopy - 05/15/2023, 10/11/2022, 03/26/2022, 12/22/2021, 08/23/2021, 05/19/2021, 2022 Cystoscopy TURBT <2 cm - 11/13/2022 Cystoscopy TURBT >5 cm - 2022 Cystoscopy TURBT 2-5 cm - 09/01/2021, 2022 ESWL - 2016, 2013 Locm 300-399Mg /Ml Iodine,1Ml - 08/21/2021, 2021 Partial nephrectomy (laparoscopic) - 2013       PSH Notes: Lithotripsy, Kidney Surgery Laparoscopic Partial Nephrectomy, Lithotripsy   NON-GU PSH: Visit Complexity (formerly GPC1X) - 03/06/2023, 11/21/2022     GU PMH: Bladder Cancer Anterior - 07/03/2023, - 06/19/2023, - 06/05/2023, - 03/15/2023, - 04/24/2022, - 04/10/2022, - 04/03/2022, - 01/23/2022, - 01/16/2022, - 01/09/2022, - 2023, - 2023, - 2023, - 2023, - 10/13/2021, - 09/21/2021, - 2022, - 2022, - 2022, - 2022, - 2022, - 2022, His restaging  path was negative for residual or muscle invasive disease. I will have him return for BCG induction and have reviewed the side effects. , - 2022, He has T1 HG NMIBC of the anterior bladder wall and will have a restaging TURBT on 01/06/21. If no muscle invasion is found at the next resection, he will need induction BCG therapy. I reviewed the risks of the procedure and also the BCG therapy. , - 2022 BPH w/LUTS, He is voiding ok on afluzosin, finasteride and tadalafil with an IPSS of 7 and a PF of 25ml/sec. His PVR is 83ml. He has a large prostate so I discussed Aquablation and PAE but he will stay the course for now. - 05/15/2023, He will continue the finasteride and alfuzosin. , - 03/06/2023, He is better on the alfuzosin and finasteride. Continue current therapy., - 11/21/2022, Alfuzosin refilled. He will continue the finasteride. , - 12/22/2021, He has stable BPH with a large trilobar prostate. He has moderate LUTS., - 08/23/2021, He has moderate LUTS on finasteride and alfuzosin but the symptoms have improved since he passed the material. No change in therapy. , - 07/24/2021, I am going to have him stay on the finasteride daily and start alfuzosin to see if that will have less of an impact on ejaculation. , - 05/19/2021, He has some urgency following the procedure but is otherwise voiding ok. , - 2022, He has persistent urgency with BPH and BOO on cystoscopy but there is worrisome asymmetric erythema of the bladder neck that is worrisome for CIS but he did have a small stone in the prostatic urethra that was in the bladder on CT and could have caused inflammatory changes. I will send a cytology and have him scheduled for a cystoscopy with bladder biopsy. The risks of bleeding, infection, injury to the urinary tract, thrombotic events and anesthetic complications reviewed. , - 2022, His PSA has been rising and I will repeat that today. 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 , - 12/22/2021, He has progressive ED and LUTS. I discussed options  and will try him on tadalafil 5mg  daily. He is a daily MJ user so he could have low T. I will check his level.. , - 2021 Elevated PSA, His PSA is up some. I will repeat it today and if there is a further increase, I will get an MRI and consider a biopsy. - 05/15/2023, His PSA is up but it could be from infection. I will recheck in 6-8 weeks. , - 03/06/2023, His PSA is up somewhat on intermittent finasteride. There may be some impact from the BCG. I will have him stay on the finasteride and repeat the PSA in 3 months prior to f/u. , - 05/19/2021 History of bladder cancer, No recurrent tumors seen. he will return in 1-2 weeks for maintenance BCG. He will need cystoscopy in 3 months. - 05/15/2023, I will reschedule the cystoscopy since he appears to have a UTI. , - 03/06/2023, He had inflammation on his recent biopsy. Cystoscopy in 3months. , - 11/21/2022, No recurrent tumors seen. There is some stable patchy erythema that is most consistent with BCG effect but I will reassess in 3 months and consider a biopsy if still present. He will need 3 weeks of BCG maintence. Cytology today. , - 03/26/2022, There are no papillary lesions but there are some mucosal changes that are suggestive of treatment effect on the dome and some non-specific mucosal changes at the bladder neck that also appear more inflammatory than neoplastic. I will get a cytology with reflex and if that is abnormal, he will need bladder biopsies and upper tract studies. If the cytology is negative, I will have him return for maintenance BCG and then cystoscopy in 3 months. , - 12/22/2021, He has 2 new lesions on the dome and right lateral wall that are consistent with recurrent urothelial CA. I will get him scheduled for TURBT with instillation of Gemcitabine. I have  reviewed the risks in detail. , - 08/23/2021, - 08/21/2021, He passed some tissue that may have been just residual material from the BCG healing process. The urine is clear today but I will send a cytology. He has cystoscopy scheduled in November. , - 07/24/2021, No recurrent tumors seen. I will get a cytology today and have him return in 3 months for cystoscopy and consideration of salvage BCG. , - 05/19/2021 History of kidney cancer, No recurrence on recent CT. - 05/15/2023, - 2021, I will get a CXR for his history of renal cell CA. , - 2021 History of urolithiasis, He has small renal and bladder stones on recent CT. - 05/15/2023, He had gross hematuria and may have passed a stone a couple of months ago. I will get him set up for a CT Hematuria study to assess the cause of the hematuria. , - 2021, History of renal calculi, - 2016 Flank Pain - 03/15/2023 Acute Cystitis/UTI, His UA looks infected and he never took the macrobid. I am going to put him on bactrim and if the culture is positive, I will keep him on it for a month. - 03/06/2023 Nocturia (Stable) - 03/06/2023, (Stable), - 11/21/2022, - 12/22/2021 Urinary Urgency (Stable) - 11/21/2022, - 12/22/2021, - 08/23/2021, - 07/24/2021 (Stable), This is probably related to the calcium deposition and recent BCG. I will reassess in the 3 months. He may need removal if the calcium deposits worsen. , - 05/19/2021, - 2022, - 2022, - 2021 Bladder Cancer Dome, He has a 1.5cm posterior lesion and a 5mm dome lesion. I am going to get him  set up for cystoscopy with bilateral RTG's, TURBT and instillation of Gemcitabine and he may need gemcitabine x 6 weeks and maintenance monthly or Keytruda to follow. The risks of the TURBT reviewed in detail including bleeding, infection, bladder and urethral injury, thrombotic events, chemical cystitis and anesthetic complications. - 10/11/2022, - 10/06/2021, He had 2 sites with T1 HG NMIBC on his recent biopsy and had no muscle involvement. I don't  think he needs a re-resection at this time but I would like to go ahead and get him set up for the BCG therapy for repeat induction. , - 09/15/2021, - 08/23/2021 Bladder Cancer Posterior - 10/11/2022 Bladder Cancer Lateral - 09/15/2021, - 08/23/2021 Bladder Stone, He has multiple small bladder stones that will be removed at the time of the TURBT. They have an unusual behavior of floating somewhat. - 08/23/2021, He has some calcification deposition on the bladder wall at the resection sites that are possibly related to his urgency. , - 05/19/2021, I will remove this if still present at cystoscopy., - 2022 Adrenal mass Unspec, He has stable adrenal adenomas. - 2022, Adrenal cortical adenoma, unspecified laterality, - 2015 Renal calculus, He has small bilateral renal stones and cysts. - 2022 Urinary Frequency - 2021 Kidney Cancer Unspec, except renal pelvis, Renal cell carcinoma, unspecified laterality - 2016 Other microscopic hematuria, Microscopic hematuria - 2016      PMH Notes: cancer kidney   NON-GU PMH: Encounter for general adult medical examination without abnormal findings, Encounter for preventive health examination - 2016 Cervical disc disorder, unspecified, unspecified cervical region, Cervical Disc Degeneration - 2014 Personal history of other diseases of the circulatory system, History of hypertension - 2014 Personal history of other diseases of the nervous system and sense organs, History of glaucoma - 2014 Personal history of other endocrine, nutritional and metabolic disease, History of diabetes mellitus - 2014 GERD Hypertension    FAMILY HISTORY: 1 Daughter - Other 1 son - Other Colon Cancer - Mother Kidney Cancer - No Family History   SOCIAL HISTORY: Marital Status: Married Current Smoking Status: Patient smokes. Smokes less than 1/2 pack per day.   Tobacco Use Assessment Completed: Used Tobacco in last 30 days? Has never drank.  Drinks 4+ caffeinated drinks per day.      Notes: Current every day smoker, Using Marijuana, Tobacco Use, Marital History - Currently Married, Alcohol Use, Occupation:   REVIEW OF SYSTEMS:    GU Review Male:   Patient reports get up at night to urinate. Patient denies frequent urination, hard to postpone urination, burning/ pain with urination, leakage of urine, stream starts and stops, trouble starting your stream, have to strain to urinate , erection problems, and penile pain.  Gastrointestinal (Upper):   Patient denies nausea, vomiting, and indigestion/ heartburn.  Gastrointestinal (Lower):   Patient denies diarrhea and constipation.  Constitutional:   Patient denies fever, night sweats, weight loss, and fatigue.  Skin:   Patient denies skin rash/ lesion and itching.  Eyes:   Patient denies blurred vision and double vision.  Ears/ Nose/ Throat:   Patient denies sore throat and sinus problems.  Hematologic/Lymphatic:   Patient denies swollen glands and easy bruising.  Cardiovascular:   Patient denies chest pains and leg swelling.  Respiratory:   Patient denies cough and shortness of breath.  Endocrine:   Patient denies excessive thirst.  Musculoskeletal:   Patient denies back pain and joint pain.  Neurological:   Patient denies headaches and dizziness.  Psychologic:   Patient denies depression  and anxiety.   VITAL SIGNS: None   MULTI-SYSTEM PHYSICAL EXAMINATION:    Constitutional: Well-nourished. No physical deformities. Normally developed. Good grooming.   Respiratory: Normal breath sounds. No labored breathing, no use of accessory muscles.   Cardiovascular: Regular rate and rhythm. No murmur, no gallop..      Complexity of Data:  Records Review:   AUA Symptom Score, Previous Patient Records  Urine Test Review:   Urinalysis   05/29/23 02/20/23 03/20/22 08/16/21 03/06/21 08/03/20 08/12/07  PSA  Total PSA 4.68 ng/mL 5.20 ng/mL 3.33 ng/mL 2.60 ng/mL 3.87 ng/ml 3.78 ng/mL 2.26   Free PSA 1.18 ng/mL 0.70 ng/mL       % Free PSA  25 % PSA 13 % PSA         PROCEDURES:         Flexible Cystoscopy - 52000  Risks, benefits, and some of the potential complications of the procedure were discussed. He was prepped with betadine and the urethral was instilled with 2% lidocaine jelly. Cipro 500mg  given for antibiotic prophylaxis with an extra tab to take in the morning..     Meatus:  Normal size. Normal location. Normal condition.  Urethra:  No strictures.  External Sphincter:  Normal.  Verumontanum:  Normal.  Prostate:  Obstructing. Moderate hyperplasia.  Bladder Neck:  Non-obstructing.  Ureteral Orifices:  Normal location. Normal size. Normal shape. Effluxed clear urine.  Bladder:  No trabeculation. No tumors. Normal mucosa. No stones. There is some debris in the bladder and several small areas of erythema that could be treatment effect but are more concerning for CIS, particularly on the right lateral wall.       The procedure was well tolerated and there were no complications.         Urinalysis w/Scope Dipstick Dipstick Cont'd Micro  Color: Yellow Bilirubin: Neg mg/dL WBC/hpf: 10 - 16/XWR  Appearance: Slightly Cloudy Ketones: Neg mg/dL RBC/hpf: 0 - 2/hpf  Specific Gravity: 1.015 Blood: Neg ery/uL Bacteria: Mod (26-50/hpf)  pH: <=5.0 Protein: Neg mg/dL Cystals: NS (Not Seen)  Glucose: 2+ mg/dL Urobilinogen: 0.2 mg/dL Casts: NS (Not Seen)    Nitrites: Neg Trichomonas: Not Present    Leukocyte Esterase: 2+ leu/uL Mucous: Present      Epithelial Cells: NS (Not Seen)      Yeast: NS (Not Seen)      Sperm: Not Present    ASSESSMENT:      ICD-10 Details  1 GU:   History of bladder cancer - Z85.51 Chronic, Stable - He has some erythematous areas in the bladder that could be treatment effect but CIS is a possibility as well. I will send a cytoloogy and get him set up for a cystoscopy with bil RTG's and possible TURBT. Risks reviewed.   3   BPH w/LUTS - N40.1 Chronic, Stable - He is only on daily tadalafil.  4    Urinary Urgency - R39.15 Chronic, Stable - THis is his primary urinary complaint and it is possible that it is related to the bladder findings.   5   ED due to arterial insufficiency - N52.01 Chronic, Stable - continue tadalafil.  2 NON-GU:   Bacteriuria - R82.71 Minor   PLAN:            Medications Stop Meds: Finasteride  Discontinue: 11/15/2023  - Reason: The medication cycle was completed.  Alfuzosin Hcl Er 10 mg tablet, extended release 24 hr 1 tablet PO Daily  Start: 03/07/2023  Stop: 03/06/2024   Discontinue:  11/15/2023  - Reason: The medication cycle was completed.            Orders Labs Cytology w/reflex FISH          Schedule Return Visit/Planned Activity: Next Available Appointment - Office Visit, Schedule Surgery

## 2023-11-28 ENCOUNTER — Encounter (HOSPITAL_COMMUNITY): Payer: Self-pay | Admitting: Urology

## 2023-11-28 NOTE — Anesthesia Postprocedure Evaluation (Signed)
Anesthesia Post Note  Patient: [name] [name]  Procedure(s) Performed: TRANSURETHRAL RESECTION OF BLADDER TUMOR (TURBT), BILATERAL RETROGRADE     Patient location during evaluation: PACU Anesthesia Type: General Level of consciousness: awake and alert Pain management: pain level controlled Vital Signs Assessment: post-procedure vital signs reviewed and stable Respiratory status: spontaneous breathing, nonlabored ventilation, respiratory function stable and patient connected to nasal cannula oxygen Cardiovascular status: blood pressure returned to baseline and stable Postop Assessment: no apparent nausea or vomiting Anesthetic complications: no   No notable events documented.  Last Vitals:  Vitals:   11/27/23 1500 11/27/23 1515  BP: (!) 151/73 139/82  Pulse: 76 75  Resp: 15 16  Temp:    SpO2: 97% 98%    Last Pain:  Vitals:   11/27/23 1515  TempSrc:   PainSc: 0-No pain                 Mariann Barter

## 2023-11-29 LAB — SURGICAL PATHOLOGY

## 2023-12-03 ENCOUNTER — Encounter: Payer: Self-pay | Admitting: Internal Medicine

## 2023-12-03 ENCOUNTER — Ambulatory Visit (INDEPENDENT_AMBULATORY_CARE_PROVIDER_SITE_OTHER): Payer: Medicare Other | Admitting: Internal Medicine

## 2023-12-03 VITALS — BP 120/74 | HR 74 | Temp 98.0°F | Ht 66.0 in | Wt 180.0 lb

## 2023-12-03 DIAGNOSIS — Z85528 Personal history of other malignant neoplasm of kidney: Secondary | ICD-10-CM | POA: Insufficient documentation

## 2023-12-03 DIAGNOSIS — L723 Sebaceous cyst: Secondary | ICD-10-CM

## 2023-12-03 DIAGNOSIS — Z7984 Long term (current) use of oral hypoglycemic drugs: Secondary | ICD-10-CM | POA: Diagnosis not present

## 2023-12-03 DIAGNOSIS — F1220 Cannabis dependence, uncomplicated: Secondary | ICD-10-CM | POA: Diagnosis not present

## 2023-12-03 DIAGNOSIS — E1142 Type 2 diabetes mellitus with diabetic polyneuropathy: Secondary | ICD-10-CM | POA: Diagnosis not present

## 2023-12-03 DIAGNOSIS — R066 Hiccough: Secondary | ICD-10-CM | POA: Diagnosis not present

## 2023-12-03 DIAGNOSIS — I10 Essential (primary) hypertension: Secondary | ICD-10-CM | POA: Diagnosis not present

## 2023-12-03 DIAGNOSIS — E118 Type 2 diabetes mellitus with unspecified complications: Secondary | ICD-10-CM

## 2023-12-03 NOTE — Assessment & Plan Note (Signed)
F/u w/Dr Annabell Howells S/p cystoscopy

## 2023-12-03 NOTE — Assessment & Plan Note (Addendum)
Monitor A1c Amaryl  4 mg bid before (max dose) Metformin bid 500 mg On Rybelsus

## 2023-12-03 NOTE — Progress Notes (Signed)
Subjective:  Patient ID: [name], male    DOB: 1956-02-04  Age: 68 y.o. MRN: [id number]  CC: Medication Problem (Discuss diabetes medications )   HPI DERRYL UHER presents for R hand weakness He discussed PIN decompression and nerve transfer from his supinator branch w/Dr Katrinka Blazing (NS).  C/o cyst on back Outpatient Medications Prior to Visit  Medication Sig Dispense Refill   amLODipine (NORVASC) 5 MG tablet Take 1 tablet (5 mg total) by mouth daily. 90 tablet 3   Ascorbic Acid (VITAMIN C) 1000 MG tablet Take 1,000 mg by mouth once a week.     blood glucose meter kit and supplies KIT Use to test blood sugar once a day. DX: E11.9 1 each 0   Blood Glucose Monitoring Suppl (CONTOUR NEXT MONITOR) w/Device KIT 1 Device by Does not apply route daily. 1 kit 0   cholecalciferol (VITAMIN D) 1000 UNITS tablet Take 1,000 Units by mouth daily.     clobetasol (TEMOVATE) 0.05 % external solution Apply 1 Application topically 2 (two) times daily as needed. Itchy scalp 50 mL 3   CONTOUR NEXT TEST test strip USE AS DIRECTED 100 strip 5   cyclobenzaprine (FLEXERIL) 5 MG tablet TAKE 1 TABLET BY MOUTH THREE TIMES A DAY AS NEEDED FOR MUSCLE SPASMS 90 tablet 1   glimepiride (AMARYL) 4 MG tablet Take 1 tablet (4 mg total) by mouth in the morning and at bedtime. 180 tablet 3   HYDROcodone-acetaminophen (NORCO) 10-325 MG tablet Take 1 tablet by mouth every 6 (six) hours as needed for severe pain (pain score 7-10). 40 tablet 0   ibuprofen (ADVIL) 800 MG tablet TAKE 1 TABLET (800 MG TOTAL) BY MOUTH DAILY AS NEEDED FOR MODERATE PAIN. 30 tablet 0   ketoconazole (NIZORAL) 2 % cream Apply 1 Application topically daily as needed for irritation.     Lancets MISC Use to check blood sugar once per day. 100 each 3   metFORMIN (GLUCOPHAGE) 500 MG tablet Take 1 tablet (500 mg total) by mouth 2 (two) times daily with a meal. 180 tablet 0   olmesartan (BENICAR) 40 MG tablet Take 1 tablet (40 mg total) by mouth daily.  90 tablet 3   potassium chloride SA (KLOR-CON) 20 MEQ tablet Take 1 tablet (20 mEq total) by mouth daily. (Patient taking differently: Take 20 mEq by mouth 2 (two) times a week.) 90 tablet 11   repaglinide (PRANDIN) 2 MG tablet TAKE 1 TABLET BY MOUTH 3 TIMES A DAY BEFORE MEALS 270 tablet 3   RYBELSUS 14 MG TABS Take 1 tablet by mouth daily.     tadalafil (CIALIS) 5 MG tablet Take 5 mg by mouth daily.     Facility-Administered Medications Prior to Visit  Medication Dose Route Frequency Provider Last Rate Last Admin   gemcitabine (GEMZAR) chemo syringe for bladder instillation 2,000 mg  2,000 mg Bladder Instillation Once Bjorn Pippin, MD        ROS: Review of Systems  Constitutional:  Negative for appetite change, fatigue and unexpected weight change.  HENT:  Negative for congestion, nosebleeds, sneezing, sore throat and trouble swallowing.   Eyes:  Negative for itching and visual disturbance.  Respiratory:  Negative for cough.   Cardiovascular:  Negative for chest pain, palpitations and leg swelling.  Gastrointestinal:  Negative for abdominal distention, blood in stool, diarrhea and nausea.  Genitourinary:  Negative for frequency and hematuria.  Musculoskeletal:  Negative for back pain, gait problem, joint swelling and neck pain.  Skin:  Negative for rash.  Neurological:  Negative for dizziness, tremors, speech difficulty and weakness.  Psychiatric/Behavioral:  Negative for agitation, dysphoric mood and sleep disturbance. The patient is not nervous/anxious.     Objective:  BP 120/74   Pulse 74   Temp 98 F (36.7 C) (Oral)   Ht 5\' 6"  (1.676 m)   Wt 180 lb (81.6 kg)   SpO2 94%   BMI 29.05 kg/m   BP Readings from Last 3 Encounters:  12/03/23 120/74  11/27/23 139/82  11/22/23 (!) 159/114    Wt Readings from Last 3 Encounters:  12/03/23 180 lb (81.6 kg)  11/27/23 178 lb 5 oz (80.9 kg)  11/22/23 178 lb 5 oz (80.9 kg)    Physical Exam Constitutional:      General: He is not  in acute distress.    Appearance: He is well-developed. He is obese.     Comments: NAD  Eyes:     Conjunctiva/sclera: Conjunctivae normal.     Pupils: Pupils are equal, round, and reactive to light.  Neck:     Thyroid: No thyromegaly.     Vascular: No JVD.  Cardiovascular:     Rate and Rhythm: Normal rate and regular rhythm.     Heart sounds: Normal heart sounds. No murmur heard.    No friction rub. No gallop.  Pulmonary:     Effort: Pulmonary effort is normal. No respiratory distress.     Breath sounds: Normal breath sounds. No wheezing or rales.  Chest:     Chest wall: No tenderness.  Abdominal:     General: Bowel sounds are normal. There is no distension.     Palpations: Abdomen is soft. There is no mass.     Tenderness: There is no abdominal tenderness. There is no guarding or rebound.  Musculoskeletal:        General: No tenderness. Normal range of motion.     Cervical back: Normal range of motion.  Lymphadenopathy:     Cervical: No cervical adenopathy.  Skin:    General: Skin is warm and dry.     Findings: No rash.  Neurological:     Mental Status: He is alert and oriented to person, place, and time.     Cranial Nerves: No cranial nerve deficit.     Motor: No abnormal muscle tone.     Coordination: Coordination normal.     Gait: Gait normal.     Deep Tendon Reflexes: Reflexes are normal and symmetric.  Psychiatric:        Behavior: Behavior normal.        Thought Content: Thought content normal.        Judgment: Judgment normal.   R hand is spastic and weak cyst on L lower back 2x3 cm Lab Results  Component Value Date   WBC 7.8 11/22/2023   HGB 16.3 11/22/2023   HCT 47.5 11/22/2023   PLT 163 11/22/2023   GLUCOSE 213 (H) 11/22/2023   CHOL 153 03/06/2021   TRIG 170.0 (H) 03/06/2021   HDL 33.50 (L) 03/06/2021   LDLCALC 86 03/06/2021   ALT 24 08/26/2023   AST 13 08/26/2023   NA 139 11/22/2023   K 2.9 (L) 11/22/2023   CL 106 11/22/2023   CREATININE 0.75  11/22/2023   BUN 13 11/22/2023   CO2 25 11/22/2023   TSH 0.64 03/06/2021   PSA 3.87 03/06/2021   INR 1.1 (H) 01/08/2012   HGBA1C 8.8 (H) 11/22/2023   MICROALBUR 1.3 03/06/2021  DG C-Arm 1-60 Min-No Report Result Date: 11/27/2023 Fluoroscopy was utilized by the requesting physician.  No radiographic interpretation.    Assessment & Plan:   Problem List Items Addressed This Visit     DM type 2, controlled, with complication (HCC) - Primary   Monitor A1c Amaryl  4 mg bid before (max dose) Metformin bid 500 mg On Rybelsus      Relevant Orders   Comprehensive metabolic panel   Hemoglobin A1c   Essential hypertension   BP Readings from Last 3 Encounters:  12/03/23 120/74  11/27/23 139/82  11/22/23 (!) 159/114    Cont w/Olmesartan; Norvasc       Sebaceous cyst   New growing large seb cysts on L upper back x2 Will remove      Polyneuropathy due to type 2 diabetes mellitus (HCC)   Not better In therapy He discussed PIN decompression and nerve transfer from his supinator branch w/Dr Katrinka Blazing (NS).       Tetrahydrocannabinol (THC) use disorder, moderate, dependence (HCC)   Chronic      Hiccups   Resolved      History of renal carcinoma   F/u w/Dr Annabell Howells S/p cystoscopy         No orders of the defined types were placed in this encounter.     Follow-up: Return in about 3 months (around 03/01/2024) for schedule cyst removal w/me on 12/27/23 pls -  8 am. Thx.  Sonda Primes, MD

## 2023-12-03 NOTE — Assessment & Plan Note (Signed)
BP Readings from Last 3 Encounters:  12/03/23 120/74  11/27/23 139/82  11/22/23 (!) 159/114    Cont w/Olmesartan; Norvasc

## 2023-12-03 NOTE — Assessment & Plan Note (Signed)
 Resolved

## 2023-12-03 NOTE — Assessment & Plan Note (Signed)
New growing large seb cysts on L upper back x2 Will remove

## 2023-12-03 NOTE — Assessment & Plan Note (Signed)
Not better In therapy He discussed PIN decompression and nerve transfer from his supinator branch w/Dr Katrinka Blazing (NS).

## 2023-12-03 NOTE — Assessment & Plan Note (Signed)
 Chronic.

## 2023-12-04 DIAGNOSIS — Z8551 Personal history of malignant neoplasm of bladder: Secondary | ICD-10-CM | POA: Diagnosis not present

## 2023-12-06 ENCOUNTER — Other Ambulatory Visit: Payer: Self-pay | Admitting: Urology

## 2023-12-06 DIAGNOSIS — N401 Enlarged prostate with lower urinary tract symptoms: Secondary | ICD-10-CM

## 2023-12-13 ENCOUNTER — Encounter: Payer: Self-pay | Admitting: Internal Medicine

## 2023-12-16 ENCOUNTER — Ambulatory Visit: Payer: Medicare Other | Admitting: Neurosurgery

## 2023-12-16 ENCOUNTER — Encounter: Payer: Self-pay | Admitting: Neurosurgery

## 2023-12-16 VITALS — BP 148/82 | Ht 66.0 in | Wt 180.0 lb

## 2023-12-16 DIAGNOSIS — G5611 Other lesions of median nerve, right upper limb: Secondary | ICD-10-CM

## 2023-12-16 DIAGNOSIS — G540 Brachial plexus disorders: Secondary | ICD-10-CM

## 2023-12-16 DIAGNOSIS — G5631 Lesion of radial nerve, right upper limb: Secondary | ICD-10-CM | POA: Diagnosis not present

## 2023-12-19 NOTE — Progress Notes (Signed)
 Chief Complaint: Patient was seen in consultation today for benign prostatic hyperplasia with lower urinary tract symptoms.   Referring Physician(s): Wrenn,John  History of Present Illness: [name] [name] is a 68 y.o. male with a medical history significant for CAD, DM2, HTN, THC use disorder, right renal cell carcinoma and recurrent bladder cancer (s/p TURBT and BCG therapy). On 11/27/23 he underwent cystoscopy with biopsy, fulguration of a posterior lateral wall lesion and removal of small bladder stones with Dr. Annabell Howells. The biopsy was benign.   He also has a history of benign prostatic hyperplasia with lower urinary tract symptoms and struggles with incomplete bladder emptying, difficulty postponing urination and a weak stream. He was taking alfuzosin and finasteride but discontinued these drugs due to a lack of benefit. Dr. Annabell Howells discussed Aquablation and prostate artery embolization as potential treatment options and the patient requested to explore PAE.   He has been referred to Interventional Radiology to discuss prostate artery embolization.   Past Medical History:  Diagnosis Date   Benign localized prostatic hyperplasia with lower urinary tract symptoms (LUTS)    urologist--- dr Annabell Howells   Bladder cancer [facility])    urologist---- dr Annabell Howells---  s/p TURBT 11/2020; 03/ 2022   Coronary artery disease    mild   DDD (degenerative disc disease), cervical    From 1980s   Diabetes mellitus type II    followed by pcp--   (01-05-2021  per pt checks blood sugar three times per week,  fasting sugar --- 130--140_   Difficult intubation 07/28/2012   partial nephrectomy done @[facility]  07-28-2012  found to have anterior larynx , diffcult intubation, referred to anesthesia record in epic    Dyslipidemia    ED (erectile dysfunction)    Elevated PSA    GERD (gastroesophageal reflux disease)    History of adenomatous polyp of colon    History of kidney stones    History of prostatitis 2008    History of renal carcinoma urologist--- dr Annabell Howells   07-28-2012 for renal neoplasm  s/p right partal nephrectomy    Hypertension    followed by pcp   Neuromuscular disorder (HCC)    Osteoarthritis    Back   Renal carcinoma, right [facility])     Past Surgical History:  Procedure Laterality Date   CATARACT EXTRACTION     COLONOSCOPY N/A 03/03/2013   Procedure: COLONOSCOPY;  Surgeon: Hilarie Fredrickson, MD;  Location: [facility];  Service: Endoscopy;  Laterality: N/A;   COLONOSCOPY WITH PROPOFOL N/A 09/29/2019   Procedure: COLONOSCOPY WITH PROPOFOL;  Surgeon: Hilarie Fredrickson, MD;  Location: [facility];  Service: Endoscopy;  Laterality: N/A;   CYSTOSCOPY WITH BIOPSY N/A 12/09/2020   Procedure: CYSTOSCOPY WITH TURBT, BIOPSY AND FULGURATION;  Surgeon: Bjorn Pippin, MD;  Location: [facility];  Service: Urology;  Laterality: N/A;  REQUESTING 45 MINS   EXTRACORPOREAL SHOCK WAVE LITHOTRIPSY  01-10-2015;  03-03-2012  @[facility]    POLYPECTOMY  09/29/2019   Procedure: POLYPECTOMY;  Surgeon: Hilarie Fredrickson, MD;  Location: [facility];  Service: Endoscopy;;   ROBOT ASSISTED LAPAROSCOPIC PARTIAL NEPHRECTOMY  07-28-2012   DR Laverle Patter  @[facility]    TONSILLECTOMY  1965   TRANSURETHRAL RESECTION OF BLADDER TUMOR N/A 11/27/2023   Procedure: TRANSURETHRAL RESECTION OF BLADDER TUMOR (TURBT), BILATERAL RETROGRADE;  Surgeon: Bjorn Pippin, MD;  Location: [facility];  Service: Urology;  Laterality: N/A;  60 MINUTE CASE   TRANSURETHRAL RESECTION OF BLADDER TUMOR WITH MITOMYCIN-C N/A 01/06/2021   Procedure: TRANSURETHRAL RESECTION OF BLADDER TUMOR  WITH GEMCITIBINE IN PACU;  Surgeon: Bjorn Pippin, MD;  Location: [facility];  Service: Urology;  Laterality: N/A;   TRANSURETHRAL RESECTION OF BLADDER TUMOR WITH MITOMYCIN-C N/A 09/01/2021   Procedure: TRANSURETHRAL RESECTION OF BLADDER TUMOR WITH POST OPERATIVE INSTILLATION OF GEMCITABINE;  Surgeon: Bjorn Pippin, MD;  Location: [facility];  Service: Urology;  Laterality: N/A;    TRANSURETHRAL RESECTION OF BLADDER TUMOR WITH MITOMYCIN-C Bilateral 11/13/2022   Procedure: CYSTOSCOPY TRANSURETHRAL RESECTION OF BLADDER TUMOR BILATERAL RETROGRADES WITH INSTILL GEMCTABINE;  Surgeon: Bjorn Pippin, MD;  Location: [facility];  Service: Urology;  Laterality: Bilateral;  1 HR FOR CASE    Allergies: Tamsulosin, Codeine, and Wound dressing adhesive  Medications: Prior to Admission medications   Medication Sig Start Date End Date Taking? Authorizing Provider  amLODipine (NORVASC) 5 MG tablet Take 1 tablet (5 mg total) by mouth daily. 09/18/23   Plotnikov, Georgina Quint, MD  Ascorbic Acid (VITAMIN C) 1000 MG tablet Take 1,000 mg by mouth once a week.    [provider]  blood glucose meter kit and supplies KIT Use to test blood sugar once a day. DX: E11.9 12/19/16   Plotnikov, Georgina Quint, MD  Blood Glucose Monitoring Suppl (CONTOUR NEXT MONITOR) w/Device KIT 1 Device by Does not apply route daily. 12/08/19   Plotnikov, Georgina Quint, MD  cholecalciferol (VITAMIN D) 1000 UNITS tablet Take 1,000 Units by mouth daily.    [provider]  clobetasol (TEMOVATE) 0.05 % external solution Apply 1 Application topically 2 (two) times daily as needed. Itchy scalp 01/23/23   Plotnikov, Georgina Quint, MD  CONTOUR NEXT TEST test strip USE AS DIRECTED 03/21/20   Plotnikov, Georgina Quint, MD  cyclobenzaprine (FLEXERIL) 5 MG tablet TAKE 1 TABLET BY MOUTH THREE TIMES A DAY AS NEEDED FOR MUSCLE SPASMS 09/29/23   Plotnikov, Georgina Quint, MD  glimepiride (AMARYL) 4 MG tablet Take 1 tablet (4 mg total) by mouth in the morning and at bedtime. 04/23/23   Plotnikov, Georgina Quint, MD  HYDROcodone-acetaminophen (NORCO) 10-325 MG tablet Take 1 tablet by mouth every 6 (six) hours as needed for severe pain (pain score 7-10). 11/27/23   Bjorn Pippin, MD  ibuprofen (ADVIL) 800 MG tablet TAKE 1 TABLET (800 MG TOTAL) BY MOUTH DAILY AS NEEDED FOR MODERATE PAIN. 09/03/23   Plotnikov, Georgina Quint, MD  Lancets MISC Use to check blood sugar once  per day. 12/19/16   Plotnikov, Georgina Quint, MD  metFORMIN (GLUCOPHAGE) 500 MG tablet Take 1 tablet (500 mg total) by mouth 2 (two) times daily with a meal. 11/04/23   Burns, Bobette Mo, MD  olmesartan (BENICAR) 40 MG tablet Take 1 tablet (40 mg total) by mouth daily. 09/18/23   Plotnikov, Georgina Quint, MD  potassium chloride SA (KLOR-CON) 20 MEQ tablet Take 1 tablet (20 mEq total) by mouth daily. Patient taking differently: Take 20 mEq by mouth 2 (two) times a week. 09/06/20   Plotnikov, Georgina Quint, MD  RYBELSUS 14 MG TABS Take 1 tablet by mouth daily. 03/22/23   [provider]  tadalafil (CIALIS) 5 MG tablet Take 5 mg by mouth daily. 09/02/20   [provider]     Family History  Problem Relation Age of Onset   Lymphoma Mother    Colon cancer Mother 22   Dementia Father    Colon cancer Paternal Aunt    Esophageal cancer Neg Hx    Rectal cancer Neg Hx    Stomach cancer Neg Hx  Social History   Socioeconomic History   Marital status: Married    Spouse name: Not on file   Number of children: Not on file   Years of education: Not on file   Highest education level: Some college, no degree  Occupational History   Not on file  Tobacco Use   Smoking status: Every Day    Current packs/day: 0.25    Average packs/day: 0.3 packs/day for 30.0 years (7.5 ttl pk-yrs)    Types: Cigarettes   Smokeless tobacco: Never   Tobacco comments:    1 pack every 2 days  Vaping Use   Vaping status: Never Used  Substance and Sexual Activity   Alcohol use: Not Currently   Drug use: Yes    Types: Marijuana    Comment: Smokes marijuana every day   Sexual activity: Yes  Other Topics Concern   Not on file  Social History Narrative   Occupation: printerCurrent smokerRegular exercise- noMarried      Are you right handed or left handed? Both right and left    Are you currently employed ?    What is your current occupation?   Do you live at home alone? No    Who lives with you? Wife    What  type of home do you live in: 1 story or 2 story? Lives in one story home      Social Drivers of Health   Financial Resource Strain: Medium Risk (05/13/2023)   Overall Financial Resource Strain (CARDIA)    Difficulty of Paying Living Expenses: Somewhat hard  Food Insecurity: No Food Insecurity (05/13/2023)   Hunger Vital Sign    Worried About Running Out of Food in the Last Year: Never true    Ran Out of Food in the Last Year: Never true  Transportation Needs: No Transportation Needs (05/13/2023)   PRAPARE - Administrator, Civil Service (Medical): No    Lack of Transportation (Non-Medical): No  Physical Activity: Insufficiently Active (05/13/2023)   Exercise Vital Sign    Days of Exercise per Week: 1 day    Minutes of Exercise per Session: 10 min  Stress: No Stress Concern Present (05/13/2023)   [employer] of Occupational Health - Occupational Stress Questionnaire    Feeling of Stress : Not at all  Social Connections: Moderately Integrated (05/13/2023)   Social Connection and Isolation Panel [NHANES]    Frequency of Communication with Friends and Family: More than three times a week    Frequency of Social Gatherings with Friends and Family: More than three times a week    Attends Religious Services: 1 to 4 times per year    Active Member of [employer] or Organizations: No    Attends Engineer, structural: Not on file    Marital Status: Married    Review of Systems: A 12 point ROS discussed and pertinent positives are indicated in the HPI above.  All other systems are negative.  Review of Systems  Vital Signs: There were no vitals taken for this visit.  Advance Care Plan: The advanced care plan/surrogate decision maker was discussed at the time of visit and documented in the medical record.    Physical Exam  Imaging: DG C-Arm 1-60 Min-No Report Result Date: 11/27/2023 Fluoroscopy was utilized by the requesting physician.  No radiographic interpretation.     Labs:  CBC: Recent Labs    02/05/23 1005 02/12/23 1509 11/22/23 1439  WBC 8.6 9.9 7.8  HGB 15.6 16.2  16.3  HCT 45.3 47.1 47.5  PLT 154.0 186.0 163    COAGS: No results for input(s): "INR", "APTT" in the last 8760 hours.  BMP: Recent Labs    02/12/23 1509 04/22/23 0954 08/26/23 1135 11/22/23 1439  NA 140 139 139 139  K 3.6 4.2 3.4* 2.9*  CL 101 103 104 106  CO2 33* 28 27 25   GLUCOSE 145* 234* 183* 213*  BUN 12 17 15 13   CALCIUM 9.9 9.6 9.4 9.4  CREATININE 0.94 0.92 0.71 0.75  GFRNONAA  --   --   --  >60    LIVER FUNCTION TESTS: Recent Labs    01/22/23 0828 02/05/23 1005 02/12/23 1509 07/31/23 1058 08/26/23 1135  BILITOT 0.4 0.5 0.5  --  0.6  AST 13 11 15   --  13  ALT 31 24 29   --  24  ALKPHOS 108 83 85  --  97  PROT 6.5 5.9* 6.5 6.3 6.8  ALBUMIN 4.2 3.7 3.9  --  4.3    TUMOR MARKERS: No results for input(s): "AFPTM", "CEA", "CA199", "CHROMGRNA" in the last 8760 hours.  Assessment and Plan:  68 year old male with a history of benign prostatic hyperplasia with lower urinary tract symptoms.   Thank you for this interesting consult.  I greatly enjoyed meeting BUD KAESER and look forward to participating in their care.  A copy of this report was sent to the requesting provider on this date.  Electronically Signed: Mickie Kay, NP 12/19/2023, 4:44 PM   I spent a total of  40 Minutes   in face to face in clinical consultation, greater than 50% of which was counseling/coordinating care for benign prostatic hyperplasia.

## 2023-12-20 ENCOUNTER — Encounter: Payer: Self-pay | Admitting: Internal Medicine

## 2023-12-23 ENCOUNTER — Other Ambulatory Visit: Payer: Self-pay | Admitting: Interventional Radiology

## 2023-12-23 ENCOUNTER — Ambulatory Visit
Admission: RE | Admit: 2023-12-23 | Discharge: 2023-12-23 | Disposition: A | Discharge status: Home / Self Care | Payer: Medicare Other | Source: Ambulatory Visit | Attending: Urology | Admitting: Urology

## 2023-12-23 DIAGNOSIS — N138 Other obstructive and reflux uropathy: Secondary | ICD-10-CM

## 2023-12-23 DIAGNOSIS — N401 Enlarged prostate with lower urinary tract symptoms: Secondary | ICD-10-CM

## 2023-12-23 DIAGNOSIS — R3914 Feeling of incomplete bladder emptying: Secondary | ICD-10-CM | POA: Diagnosis not present

## 2023-12-23 DIAGNOSIS — R3912 Poor urinary stream: Secondary | ICD-10-CM | POA: Diagnosis not present

## 2023-12-23 HISTORY — PX: IR RADIOLOGIST EVAL & MGMT: IMG5224

## 2023-12-24 ENCOUNTER — Ambulatory Visit
Admission: RE | Admit: 2023-12-24 | Discharge: 2023-12-24 | Discharge status: Home / Self Care | Source: Ambulatory Visit | Attending: Interventional Radiology

## 2023-12-24 DIAGNOSIS — N138 Other obstructive and reflux uropathy: Secondary | ICD-10-CM

## 2023-12-24 MED ORDER — IOPAMIDOL (ISOVUE-370) INJECTION 76%
100.0000 mL | Freq: Once | INTRAVENOUS | Status: AC | PRN
  Administered 2023-12-24: 100 mL via INTRAVENOUS

## 2023-12-25 DIAGNOSIS — C673 Malignant neoplasm of anterior wall of bladder: Secondary | ICD-10-CM | POA: Diagnosis not present

## 2023-12-27 ENCOUNTER — Ambulatory Visit: Payer: Medicare Other | Admitting: Internal Medicine

## 2023-12-27 ENCOUNTER — Other Ambulatory Visit (HOSPITAL_COMMUNITY)
Admission: RE | Admit: 2023-12-27 | Discharge: 2023-12-27 | Disposition: A | Discharge status: Home / Self Care | Source: Ambulatory Visit | Attending: Internal Medicine | Admitting: Internal Medicine

## 2023-12-27 ENCOUNTER — Encounter: Payer: Self-pay | Admitting: Internal Medicine

## 2023-12-27 DIAGNOSIS — L720 Epidermal cyst: Secondary | ICD-10-CM

## 2023-12-27 DIAGNOSIS — L089 Local infection of the skin and subcutaneous tissue, unspecified: Secondary | ICD-10-CM | POA: Diagnosis not present

## 2023-12-27 DIAGNOSIS — L723 Sebaceous cyst: Secondary | ICD-10-CM | POA: Insufficient documentation

## 2023-12-27 MED ORDER — MUPIROCIN 2 % EX OINT: TOPICAL_OINTMENT | CUTANEOUS | 0 refills | Status: DC

## 2023-12-27 MED ORDER — DOXYCYCLINE HYCLATE 100 MG PO TABS: 100.0000 mg | ORAL_TABLET | Freq: Two times a day (BID) | ORAL | 0 refills | Status: DC

## 2023-12-27 NOTE — Progress Notes (Signed)
 Subjective:  Patient ID: [name] [name], male    DOB: 08-Sep-1956  Age: 68 y.o. MRN: [id number]  CC: No chief complaint on file.   HPI [name] [name] presents for cyst removal  Outpatient Medications Prior to Visit  Medication Sig Dispense Refill   amLODipine (NORVASC) 5 MG tablet Take 1 tablet (5 mg total) by mouth daily. 90 tablet 3   Ascorbic Acid (VITAMIN C) 1000 MG tablet Take 1,000 mg by mouth once a week.     blood glucose meter kit and supplies KIT Use to test blood sugar once a day. DX: E11.9 1 each 0   Blood Glucose Monitoring Suppl (CONTOUR NEXT MONITOR) w/Device KIT 1 Device by Does not apply route daily. 1 kit 0   cholecalciferol (VITAMIN D) 1000 UNITS tablet Take 1,000 Units by mouth daily.     clobetasol (TEMOVATE) 0.05 % external solution Apply 1 Application topically 2 (two) times daily as needed. Itchy scalp 50 mL 3   CONTOUR NEXT TEST test strip USE AS DIRECTED 100 strip 5   cyclobenzaprine (FLEXERIL) 5 MG tablet TAKE 1 TABLET BY MOUTH THREE TIMES A DAY AS NEEDED FOR MUSCLE SPASMS 90 tablet 1   glimepiride (AMARYL) 4 MG tablet Take 1 tablet (4 mg total) by mouth in the morning and at bedtime. 180 tablet 3   HYDROcodone-acetaminophen (NORCO) 10-325 MG tablet Take 1 tablet by mouth every 6 (six) hours as needed for severe pain (pain score 7-10). 40 tablet 0   ibuprofen (ADVIL) 800 MG tablet TAKE 1 TABLET (800 MG TOTAL) BY MOUTH DAILY AS NEEDED FOR MODERATE PAIN. 30 tablet 0   Lancets MISC Use to check blood sugar once per day. 100 each 3   metFORMIN (GLUCOPHAGE) 500 MG tablet Take 1 tablet (500 mg total) by mouth 2 (two) times daily with a meal. 180 tablet 0   olmesartan (BENICAR) 40 MG tablet Take 1 tablet (40 mg total) by mouth daily. 90 tablet 3   potassium chloride SA (KLOR-CON) 20 MEQ tablet Take 1 tablet (20 mEq total) by mouth daily. (Patient taking differently: Take 20 mEq by mouth 2 (two) times a week.) 90 tablet 11   RYBELSUS 14 MG TABS Take 1 tablet by  mouth daily.     tadalafil (CIALIS) 5 MG tablet Take 5 mg by mouth daily.     Facility-Administered Medications Prior to Visit  Medication Dose Route Frequency Provider Last Rate Last Admin   gemcitabine (GEMZAR) chemo syringe for bladder instillation 2,000 mg  2,000 mg Bladder Instillation Once Bjorn Pippin, MD        ROS: Review of Systems  Objective:  There were no vitals taken for this visit.  BP Readings from Last 3 Encounters:  12/23/23 (!) 185/96  12/16/23 (!) 148/82  12/03/23 120/74    Wt Readings from Last 3 Encounters:  12/16/23 180 lb (81.6 kg)  12/03/23 180 lb (81.6 kg)  11/27/23 178 lb 5 oz (80.9 kg)    Physical Exam Cyst on the left posterior thorax measuring 31x22 mm   Procedure:cyst/ tumor excision Indication - subcutaneous cyst/tumor Risks discussed, including but not limited to bleeding, infection, scar, etc Verbal consent obtained.  Lesion #1 on the left posterior thorax measuring 31x22 mm     Skin over lesion #1  was prepped with Betadine and alcohol  and anesthetized with 5 cc of 2% lidocaine and epinephrine, using a 25-gauge 1.5 inch needle. Excisional biopsy with a sterile #11 blade was carried out  in the usual fashion -incision 3.5 cm.  4 5 cc of cream cheese-like mildly malodorous material was extracted.  Pink oblong cyst remnants were excised. Wound was irrigated with saline. Then it was closed with 5 staples. Large Band-Aid was applied with antibiotic ointment and a pressure dressing.  Wound care instructions were provided  Lab Results  Component Value Date   WBC 7.8 11/22/2023   HGB 16.3 11/22/2023   HCT 47.5 11/22/2023   PLT 163 11/22/2023   GLUCOSE 213 (H) 11/22/2023   CHOL 153 03/06/2021   TRIG 170.0 (H) 03/06/2021   HDL 33.50 (L) 03/06/2021   LDLCALC 86 03/06/2021   ALT 24 08/26/2023   AST 13 08/26/2023   NA 139 11/22/2023   K 2.9 (L) 11/22/2023   CL 106 11/22/2023   CREATININE 0.75 11/22/2023   BUN 13 11/22/2023   CO2 25  11/22/2023   TSH 0.64 03/06/2021   PSA 3.87 03/06/2021   INR 1.1 (H) 01/08/2012   HGBA1C 8.8 (H) 11/22/2023   MICROALBUR 1.3 03/06/2021    No results found.  Assessment & Plan:   Problem List Items Addressed This Visit     Infected sebaceous cyst - Primary   See procedure      Relevant Medications   mupirocin ointment (BACTROBAN) 2 %   Other Relevant Orders   Surgical pathology      Meds ordered this encounter  Medications   doxycycline (VIBRA-TABS) 100 MG tablet    Sig: Take 1 tablet (100 mg total) by mouth 2 (two) times daily.    Dispense:  14 tablet    Refill:  0   mupirocin ointment (BACTROBAN) 2 %    Sig: On leg wound w/dressing change qd or bid    Dispense:  30 g    Refill:  0      Follow-up: Return in about 2 weeks (around 01/10/2024) for suture removal.  Sonda Primes, MD

## 2023-12-27 NOTE — Assessment & Plan Note (Signed)
 See procedure

## 2023-12-31 ENCOUNTER — Encounter: Payer: Self-pay | Admitting: Internal Medicine

## 2023-12-31 LAB — SURGICAL PATHOLOGY

## 2024-01-01 DIAGNOSIS — C673 Malignant neoplasm of anterior wall of bladder: Secondary | ICD-10-CM | POA: Diagnosis not present

## 2024-01-02 ENCOUNTER — Other Ambulatory Visit (HOSPITAL_COMMUNITY): Payer: Self-pay | Admitting: Interventional Radiology

## 2024-01-02 ENCOUNTER — Encounter (HOSPITAL_COMMUNITY): Payer: Self-pay

## 2024-01-02 DIAGNOSIS — N138 Other obstructive and reflux uropathy: Secondary | ICD-10-CM

## 2024-01-06 ENCOUNTER — Other Ambulatory Visit: Payer: Self-pay | Admitting: Neurosurgery

## 2024-01-06 DIAGNOSIS — G5631 Lesion of radial nerve, right upper limb: Secondary | ICD-10-CM

## 2024-01-08 DIAGNOSIS — C673 Malignant neoplasm of anterior wall of bladder: Secondary | ICD-10-CM | POA: Diagnosis not present

## 2024-01-09 ENCOUNTER — Ambulatory Visit: Admitting: Internal Medicine

## 2024-01-13 DIAGNOSIS — G5611 Other lesions of median nerve, right upper limb: Secondary | ICD-10-CM | POA: Insufficient documentation

## 2024-01-13 NOTE — Progress Notes (Signed)
 Primary Physician:  Tresa Garter, MD  Chief Complaint: Right upper extremity weakness  History of Present Illness: 12/16/2023  Patient is here today for follow-up.  Continues to have weakness in his right upper extremity.  Has been diagnosed with brachial plexopathy.  When seen initially he had significant deficits in his upper extremity but improved his AIN function.  Continue to have PIN weakness.   Review of Systems:  A 10 point review of systems is negative, except for the pertinent positives and negatives detailed in the HPI.  Past Medical History: Past Medical History:  Diagnosis Date   Benign localized prostatic hyperplasia with lower urinary tract symptoms (LUTS)    urologist--- dr Annabell Howells   Bladder cancer [facility])    urologist---- dr Annabell Howells---  s/p TURBT 11/2020; 03/ 2022   Coronary artery disease    mild   DDD (degenerative disc disease), cervical    From 1980s   Diabetes mellitus type II    followed by pcp--   (01-05-2021  per pt checks blood sugar three times per week,  fasting sugar --- 130--140_   Difficult intubation 07/28/2012   partial nephrectomy done @[facility]  07-28-2012  found to have anterior larynx , diffcult intubation, referred to anesthesia record in epic    Dyslipidemia    ED (erectile dysfunction)    Elevated PSA    GERD (gastroesophageal reflux disease)    History of adenomatous polyp of colon    History of kidney stones    History of prostatitis 2008   History of renal carcinoma urologist--- dr Annabell Howells   07-28-2012 for renal neoplasm  s/p right partal nephrectomy    Hypertension    followed by pcp   Neuromuscular disorder (HCC)    Osteoarthritis    Back   Renal carcinoma, right [facility])     Past Surgical History: Past Surgical History:  Procedure Laterality Date   CATARACT EXTRACTION     COLONOSCOPY N/A 03/03/2013   Procedure: COLONOSCOPY;  Surgeon: Hilarie Fredrickson, MD;  Location: [facility];  Service: Endoscopy;  Laterality: N/A;   COLONOSCOPY  WITH PROPOFOL N/A 09/29/2019   Procedure: COLONOSCOPY WITH PROPOFOL;  Surgeon: Hilarie Fredrickson, MD;  Location: [facility];  Service: Endoscopy;  Laterality: N/A;   CYSTOSCOPY WITH BIOPSY N/A 12/09/2020   Procedure: CYSTOSCOPY WITH TURBT, BIOPSY AND FULGURATION;  Surgeon: Bjorn Pippin, MD;  Location: [facility];  Service: Urology;  Laterality: N/A;  REQUESTING 45 MINS   EXTRACORPOREAL SHOCK WAVE LITHOTRIPSY  01-10-2015;  03-03-2012  @[facility]    IR RADIOLOGIST EVAL & MGMT  12/23/2023   POLYPECTOMY  09/29/2019   Procedure: POLYPECTOMY;  Surgeon: Hilarie Fredrickson, MD;  Location: [facility];  Service: Endoscopy;;   ROBOT ASSISTED LAPAROSCOPIC PARTIAL NEPHRECTOMY  07-28-2012   DR Laverle Patter  @[facility]    TONSILLECTOMY  1965   TRANSURETHRAL RESECTION OF BLADDER TUMOR N/A 11/27/2023   Procedure: TRANSURETHRAL RESECTION OF BLADDER TUMOR (TURBT), BILATERAL RETROGRADE;  Surgeon: Bjorn Pippin, MD;  Location: [facility];  Service: Urology;  Laterality: N/A;  60 MINUTE CASE   TRANSURETHRAL RESECTION OF BLADDER TUMOR WITH MITOMYCIN-C N/A 01/06/2021   Procedure: TRANSURETHRAL RESECTION OF BLADDER TUMOR WITH GEMCITIBINE IN PACU;  Surgeon: Bjorn Pippin, MD;  Location: [facility];  Service: Urology;  Laterality: N/A;   TRANSURETHRAL RESECTION OF BLADDER TUMOR WITH MITOMYCIN-C N/A 09/01/2021   Procedure: TRANSURETHRAL RESECTION OF BLADDER TUMOR WITH POST OPERATIVE INSTILLATION OF GEMCITABINE;  Surgeon: Bjorn Pippin, MD;  Location: [facility];  Service: Urology;  Laterality: N/A;   TRANSURETHRAL RESECTION OF BLADDER TUMOR WITH MITOMYCIN-C Bilateral 11/13/2022   Procedure: CYSTOSCOPY TRANSURETHRAL RESECTION OF BLADDER TUMOR BILATERAL RETROGRADES WITH INSTILL GEMCTABINE;  Surgeon: Bjorn Pippin, MD;  Location: [facility];  Service: Urology;  Laterality: Bilateral;  1 HR FOR CASE    Allergies: Allergies as of 12/16/2023 - Review Complete 12/16/2023  Allergen Reaction Noted   Tamsulosin Other (See Comments)    Codeine  Itching and Rash 07/20/2020   Wound dressing adhesive Itching and Other (See Comments) 09/16/2021    Medications:  Current Outpatient Medications:    amLODipine (NORVASC) 5 MG tablet, Take 1 tablet (5 mg total) by mouth daily., Disp: 90 tablet, Rfl: 3   Ascorbic Acid (VITAMIN C) 1000 MG tablet, Take 1,000 mg by mouth once a week., Disp: , Rfl:    blood glucose meter kit and supplies KIT, Use to test blood sugar once a day. DX: E11.9, Disp: 1 each, Rfl: 0   Blood Glucose Monitoring Suppl (CONTOUR NEXT MONITOR) w/Device KIT, 1 Device by Does not apply route daily., Disp: 1 kit, Rfl: 0   cholecalciferol (VITAMIN D) 1000 UNITS tablet, Take 1,000 Units by mouth daily., Disp: , Rfl:    clobetasol (TEMOVATE) 0.05 % external solution, Apply 1 Application topically 2 (two) times daily as needed. Itchy scalp, Disp: 50 mL, Rfl: 3   CONTOUR NEXT TEST test strip, USE AS DIRECTED, Disp: 100 strip, Rfl: 5   cyclobenzaprine (FLEXERIL) 5 MG tablet, TAKE 1 TABLET BY MOUTH THREE TIMES A DAY AS NEEDED FOR MUSCLE SPASMS, Disp: 90 tablet, Rfl: 1   glimepiride (AMARYL) 4 MG tablet, Take 1 tablet (4 mg total) by mouth in the morning and at bedtime., Disp: 180 tablet, Rfl: 3   HYDROcodone-acetaminophen (NORCO) 10-325 MG tablet, Take 1 tablet by mouth every 6 (six) hours as needed for severe pain (pain score 7-10)., Disp: 40 tablet, Rfl: 0   ibuprofen (ADVIL) 800 MG tablet, TAKE 1 TABLET (800 MG TOTAL) BY MOUTH DAILY AS NEEDED FOR MODERATE PAIN., Disp: 30 tablet, Rfl: 0   Lancets MISC, Use to check blood sugar once per day., Disp: 100 each, Rfl: 3   metFORMIN (GLUCOPHAGE) 500 MG tablet, Take 1 tablet (500 mg total) by mouth 2 (two) times daily with a meal., Disp: 180 tablet, Rfl: 0   olmesartan (BENICAR) 40 MG tablet, Take 1 tablet (40 mg total) by mouth daily., Disp: 90 tablet, Rfl: 3   potassium chloride SA (KLOR-CON) 20 MEQ tablet, Take 1 tablet (20 mEq total) by mouth daily. (Patient taking differently: Take 20 mEq  by mouth 2 (two) times a week.), Disp: 90 tablet, Rfl: 11   RYBELSUS 14 MG TABS, Take 1 tablet by mouth daily., Disp: , Rfl:    tadalafil (CIALIS) 5 MG tablet, Take 5 mg by mouth daily., Disp: , Rfl:    doxycycline (VIBRA-TABS) 100 MG tablet, Take 1 tablet (100 mg total) by mouth 2 (two) times daily., Disp: 14 tablet, Rfl: 0   mupirocin ointment (BACTROBAN) 2 %, On leg wound w/dressing change qd or bid, Disp: 30 g, Rfl: 0 No current facility-administered medications for this visit.  Facility-Administered Medications Ordered in Other Visits:    gemcitabine (GEMZAR) chemo syringe for bladder instillation 2,000 mg, 2,000 mg, Bladder Instillation, Once, Bjorn Pippin, MD   Social History: Social History   Tobacco Use   Smoking status: Every Day    Current packs/day: 0.25    Average packs/day: 0.3 packs/day for 30.0 years (7.5 ttl  pk-yrs)    Types: Cigarettes   Smokeless tobacco: Never   Tobacco comments:    1 pack every 2 days  Vaping Use   Vaping status: Never Used  Substance Use Topics   Alcohol use: Not Currently   Drug use: Yes    Types: Marijuana    Comment: Smokes marijuana every day    Family Medical History: Family History  Problem Relation Age of Onset   Lymphoma Mother    Colon cancer Mother 84   Dementia Father    Colon cancer Paternal Aunt    Esophageal cancer Neg Hx    Rectal cancer Neg Hx    Stomach cancer Neg Hx     Physical Examination: Vitals:   12/16/23 1110  BP: (!) 148/82     General: Patient is well developed, well nourished, calm, collected, and in no apparent distress.  NEUROLOGICAL:  General: In no acute distress.   Awake, alert, oriented to person, place, and time.  Pupils equal round and reactive to light.  Facial tone is symmetric.  Tongue protrusion is midline.  There is no pronator drift.  ROM of spine: full.  Palpation of spine: nontender.    Strength: Deltoid strength is full.  External rotation is full.  Elbow flexion is full, elbow  extension is full, wrist extension shows radial deviation and weakness with the ECU but full strength with ECR B/L, he has no evidence of radial innervated finger extensors.   He has some mild sensory changes in the C7 distribution on the right  Reflexes are 2+ and symmetric at the biceps, triceps, brachioradialis, patella and achilles. Hoffman's is absent.  Clonus is not present.  Toes are down-going.    Gait is normal.  No difficulty with tandem gait.    Patient trying to perform right wrist extension and finger extension simultaneously pictured above  Imaging: Narrative & Impression  CLINICAL DATA:  Neck pain. Chronic degenerative changes. Right arm radicular pain. Right upper extremity pain and weakness with some numbness. Symptoms worsening over the last 2 months.   EXAM: MRI CERVICAL SPINE WITHOUT CONTRAST   TECHNIQUE: Multiplanar, multisequence MR imaging of the cervical spine was performed. No intravenous contrast was administered.   COMPARISON:  Radiography 04/30/2023   FINDINGS: Alignment: Normal   Vertebrae: No fracture or focal bone lesion. No edematous endplate marrow changes or edematous facet arthritis.   Cord: No primary cord lesion.  See below regarding stenosis.   Posterior Fossa, vertebral arteries, paraspinal tissues: Negative   Disc levels:   Foramen magnum, C1-2 and C2-3 levels are normal.   C3-4: Chronic degenerative spondylosis with endplate osteophytes and mild bulging of the disc. Mild canal narrowing with AP diameter in the midline 8.5 mm. No cord compression. Mild bilateral foraminal narrowing, not visibly compressive.   C4-5: Mild bulging of the disc.  No canal or foraminal stenosis.   C5-6: Spondylosis with endplate osteophytes and protruding disc material more prominent in the right posterolateral direction. Effacement of the subarachnoid space with indentation of the cord, particularly on the right. AP diameter of the canal in the  midline 7.4 mm. Foraminal stenosis right worse than left. In particular, the right C6 nerve root is likely affected.   C6-7: Endplate osteophytes and bulging of the disc more prominent towards the right. No compressive canal stenosis. Moderate foraminal narrowing on the right that could possibly affect the C7 nerve. This is not as severe as the level above.   C7-T1: Right posterolateral disc herniation  with foraminal extension likely to compress the right C8 nerve.   IMPRESSION: 1. 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 2. C5-6: Spondylosis with endplate osteophytes and protruding disc material more prominent in the right posterolateral direction. Effacement of the subarachnoid space with indentation of the cord, particularly on the right. AP diameter of the canal in the midline 7.4 mm. Foraminal stenosis right worse than left. In particular, the right C6 nerve root is likely affected. 3. C6-7: Endplate osteophytes and bulging of the disc more prominent towards the right. Moderate foraminal narrowing on the right that could possibly affect the C7 nerve. This is not as severe as the level above.     Electronically Signed   By: Paulina Fusi M.D.   On: 07/03/2023 17:38        I have personally reviewed the images and agree with the above interpretation.    Labs:    Latest Ref Rng & Units 11/22/2023    2:39 PM 02/12/2023    3:09 PM 02/05/2023   10:05 AM  CBC  WBC 4.0 - 10.5 K/uL 7.8  9.9  8.6   Hemoglobin 13.0 - 17.0 g/dL 40.9  81.1  91.4   Hematocrit 39.0 - 52.0 % 47.5  47.1  45.3   Platelets 150 - 400 K/uL 163  186.0  154.0        Assessment and Plan: Mr. Bosket is a pleasant 68 y.o. male with presentation of right upper extremity weakness which is mostly defined by a finger drop without severe wrist drop.  He stated that this came on with a prodrome of severe right shoulder pain that was  keeping him from sleeping.  After this shoulder pain wore off he then developed a progressive deficit in his finger extension.  When he first presented back in October he had significant deficits in PIN and AIN innervated musculature, was referred to neurology for evaluation, he has since had occupational therapy and has had an improvement in some of his motor function and overall hand function.  He continues to have a severe deficit in the PIN innervated musculature.  He continues to have weakness in his PIN musculature.  He is here today to discuss PIN decompression and nerve transfers.  He continues to be a candidate for this procedure.  Lovenia Kim, MD/MSCR Dept. of Neurosurgery

## 2024-01-14 ENCOUNTER — Ambulatory Visit: Admitting: Internal Medicine

## 2024-01-14 ENCOUNTER — Encounter: Payer: Self-pay | Admitting: Internal Medicine

## 2024-01-14 VITALS — BP 160/108 | HR 90 | Temp 98.7°F | Ht 66.0 in | Wt 182.0 lb

## 2024-01-14 DIAGNOSIS — L723 Sebaceous cyst: Secondary | ICD-10-CM

## 2024-01-14 NOTE — Progress Notes (Signed)
 Subjective:  Patient ID: [name] [name], male    DOB: 1956/07/11  Age: 68 y.o. MRN: [id number]  CC: Suture / Staple Removal   HPI CACE OSORTO presents for staples removal  Outpatient Medications Prior to Visit  Medication Sig Dispense Refill   amLODipine (NORVASC) 5 MG tablet Take 1 tablet (5 mg total) by mouth daily. 90 tablet 3   Ascorbic Acid (VITAMIN C) 1000 MG tablet Take 1,000 mg by mouth once a week.     blood glucose meter kit and supplies KIT Use to test blood sugar once a day. DX: E11.9 1 each 0   Blood Glucose Monitoring Suppl (CONTOUR NEXT MONITOR) w/Device KIT 1 Device by Does not apply route daily. 1 kit 0   cholecalciferol (VITAMIN D) 1000 UNITS tablet Take 1,000 Units by mouth daily.     clobetasol (TEMOVATE) 0.05 % external solution Apply 1 Application topically 2 (two) times daily as needed. Itchy scalp 50 mL 3   CONTOUR NEXT TEST test strip USE AS DIRECTED 100 strip 5   cyclobenzaprine (FLEXERIL) 5 MG tablet TAKE 1 TABLET BY MOUTH THREE TIMES A DAY AS NEEDED FOR MUSCLE SPASMS 90 tablet 1   doxycycline (VIBRA-TABS) 100 MG tablet Take 1 tablet (100 mg total) by mouth 2 (two) times daily. 14 tablet 0   glimepiride (AMARYL) 4 MG tablet Take 1 tablet (4 mg total) by mouth in the morning and at bedtime. 180 tablet 3   HYDROcodone-acetaminophen (NORCO) 10-325 MG tablet Take 1 tablet by mouth every 6 (six) hours as needed for severe pain (pain score 7-10). 40 tablet 0   ibuprofen (ADVIL) 800 MG tablet TAKE 1 TABLET (800 MG TOTAL) BY MOUTH DAILY AS NEEDED FOR MODERATE PAIN. 30 tablet 0   Lancets MISC Use to check blood sugar once per day. 100 each 3   metFORMIN (GLUCOPHAGE) 500 MG tablet Take 1 tablet (500 mg total) by mouth 2 (two) times daily with a meal. 180 tablet 0   mupirocin ointment (BACTROBAN) 2 % On leg wound w/dressing change qd or bid 30 g 0   olmesartan (BENICAR) 40 MG tablet Take 1 tablet (40 mg total) by mouth daily. 90 tablet 3   potassium chloride SA  (KLOR-CON) 20 MEQ tablet Take 1 tablet (20 mEq total) by mouth daily. (Patient taking differently: Take 20 mEq by mouth 2 (two) times a week.) 90 tablet 11   RYBELSUS 14 MG TABS Take 1 tablet by mouth daily.     tadalafil (CIALIS) 5 MG tablet Take 5 mg by mouth daily.     Facility-Administered Medications Prior to Visit  Medication Dose Route Frequency Provider Last Rate Last Admin   gemcitabine (GEMZAR) chemo syringe for bladder instillation 2,000 mg  2,000 mg Bladder Instillation Once Bjorn Pippin, MD        ROS: Review of Systems  Objective:  BP (!) 160/108 (BP Location: Left Arm, Patient Position: Sitting) Comment: No meds this morning  Pulse 90   Temp 98.7 F (37.1 C) (Temporal)   Ht 5\' 6"  (1.676 m)   Wt 182 lb (82.6 kg)   SpO2 95%   BMI 29.38 kg/m   BP Readings from Last 3 Encounters:  01/14/24 (!) 160/108  12/23/23 (!) 185/96  12/16/23 (!) 148/82    Wt Readings from Last 3 Encounters:  01/14/24 182 lb (82.6 kg)  12/16/23 180 lb (81.6 kg)  12/03/23 180 lb (81.6 kg)    Physical Exam   staples removed x  6 - wound healed well  Lab Results  Component Value Date   WBC 7.8 11/22/2023   HGB 16.3 11/22/2023   HCT 47.5 11/22/2023   PLT 163 11/22/2023   GLUCOSE 213 (H) 11/22/2023   CHOL 153 03/06/2021   TRIG 170.0 (H) 03/06/2021   HDL 33.50 (L) 03/06/2021   LDLCALC 86 03/06/2021   ALT 24 08/26/2023   AST 13 08/26/2023   NA 139 11/22/2023   K 2.9 (L) 11/22/2023   CL 106 11/22/2023   CREATININE 0.75 11/22/2023   BUN 13 11/22/2023   CO2 25 11/22/2023   TSH 0.64 03/06/2021   PSA 3.87 03/06/2021   INR 1.1 (H) 01/08/2012   HGBA1C 8.8 (H) 11/22/2023   MICROALBUR 1.3 03/06/2021    No results found.  Assessment & Plan:   Problem List Items Addressed This Visit     Sebaceous cyst - Primary   Staples removal x 6 - healed well         No orders of the defined types were placed in this encounter.     Follow-up: No follow-ups on file.  Sonda Primes,  MD

## 2024-01-14 NOTE — Assessment & Plan Note (Signed)
 Staples removal x 6 - healed well

## 2024-01-15 ENCOUNTER — Ambulatory Visit: Payer: Medicare Other | Admitting: Neurology

## 2024-01-15 VITALS — BP 187/98 | HR 81 | Ht 66.0 in | Wt 182.0 lb

## 2024-01-15 DIAGNOSIS — G5622 Lesion of ulnar nerve, left upper limb: Secondary | ICD-10-CM

## 2024-01-15 DIAGNOSIS — G545 Neuralgic amyotrophy: Secondary | ICD-10-CM

## 2024-01-15 MED ORDER — DULOXETINE HCL 30 MG PO CPEP: 30.0000 mg | ORAL_CAPSULE | Freq: Every day | ORAL | 3 refills | Status: DC

## 2024-01-15 NOTE — Progress Notes (Signed)
 Follow-up Visit   Date: 01/15/2024    [name] [name] MRN: [id number] DOB: [birth year]    [name] [name] is a 68 y.o. ambidextrousmale with diabetes mellitus, hyperlipidemia, hypertension, bladder cancer, CAD, right renal cancer, and GERD returning to the clinic for follow-up of right brachial plexopathy.  The patient was accompanied to the clinic by self.  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  MRI brachial plexus was normal.  He is undergoing evaluation by neurosurgery for possible PIN decompression and nerve transfer. For pain, I will start duloxetine 30mg  at bedtime as he is not tolerating gabapentin.   Left ulnar neuropathy at the elbow, currently asymptomatic.  Diabetic neuropathy affecting the feet, mild.   Return to clinic in 4 months  --------------------------------------------- History of present illness: Around July, he began having right shoulder pain, sharp and throbbing.  He recalls having to sleep in a recliner for several weeks.  His pain eventually resolved, but then he noticed weakness in the right hand, such that he is unable to extend his wrist or fingers.  He takes gabapentin 300mg  three times daily which helps with arm tingling.  Prior testing includes MRI cervical spine which shows disc herniation at right C5-6.  NCS/EMG performed here is consistent with right brachial plexopathy affecting primarily the middle and inferior trunks.  He is here is for further evaluation.    He reports having a similar problem in the neck in the late 1980s, which was treated conservatively.   UPDATE 08/23/2023:  He is here for acute follow-up with complains of left hand tingling and numbness involving the 4th and 5th fingers.  He denies hand weakness.  Symptoms are mild and not constant.  He was concerned because  the tingling felt the same as when his right arm started getting weak.  He has been doing OT but unfortunately not noticed any significant improvement of right hand and finger extension.  He takes gabapentin 200mg  twice daily.  He is most concern whether he has TB because he found out that his daughter has TB.  He denies fever, night sweats, weight loss, or new cough.   UPDATE 10/30/2023:  He is here for follow-up visit.  He has completed PT with mild improvement of strength in the right hand but he still is unable to extend the fingers.  He continues to have tingling in the arms and takes gabapentin 100mg  at bedtime.  He has side effects with higher dose.  He denies any new weakness. He is no longer having tingling in the left hand.  He had MRI brachial plexus on 1/11, however results are not available.   UPDATE 01/15/2024:  He is here for follow-up visit.  He has noticed that he's able to spread the fingers out a little better, but still not able to extend his fingers.  He continues to have spells of severe pain and takes gabapentin 100mg , but it makes him sleepy.  He is also undergoing evaluation for possible nerve transfer with Dr. Katrinka Blazing (neurosurgery).   Medications:  Current Outpatient Medications on File Prior to Visit  Medication Sig Dispense Refill   amLODipine (NORVASC) 5 MG tablet Take 1 tablet (5 mg total) by mouth daily. 90 tablet 3   Ascorbic Acid (VITAMIN C) 1000 MG tablet Take 1,000 mg by mouth once a week.     blood glucose meter  kit and supplies KIT Use to test blood sugar once a day. DX: E11.9 1 each 0   Blood Glucose Monitoring Suppl (CONTOUR NEXT MONITOR) w/Device KIT 1 Device by Does not apply route daily. 1 kit 0   cholecalciferol (VITAMIN D) 1000 UNITS tablet Take 1,000 Units by mouth daily.     clobetasol (TEMOVATE) 0.05 % external solution Apply 1 Application topically 2 (two) times daily as needed. Itchy scalp 50 mL 3   CONTOUR NEXT TEST test strip USE AS DIRECTED 100 strip 5    cyclobenzaprine (FLEXERIL) 5 MG tablet TAKE 1 TABLET BY MOUTH THREE TIMES A DAY AS NEEDED FOR MUSCLE SPASMS 90 tablet 1   doxycycline (VIBRA-TABS) 100 MG tablet Take 1 tablet (100 mg total) by mouth 2 (two) times daily. 14 tablet 0   gabapentin (NEURONTIN) 100 MG capsule Take 100 mg by mouth at bedtime.     glimepiride (AMARYL) 4 MG tablet Take 1 tablet (4 mg total) by mouth in the morning and at bedtime. 180 tablet 3   HYDROcodone-acetaminophen (NORCO) 10-325 MG tablet Take 1 tablet by mouth every 6 (six) hours as needed for severe pain (pain score 7-10). 40 tablet 0   ibuprofen (ADVIL) 800 MG tablet TAKE 1 TABLET (800 MG TOTAL) BY MOUTH DAILY AS NEEDED FOR MODERATE PAIN. 30 tablet 0   Lancets MISC Use to check blood sugar once per day. 100 each 3   metFORMIN (GLUCOPHAGE) 500 MG tablet Take 1 tablet (500 mg total) by mouth 2 (two) times daily with a meal. 180 tablet 0   mupirocin ointment (BACTROBAN) 2 % On leg wound w/dressing change qd or bid 30 g 0   olmesartan (BENICAR) 40 MG tablet Take 1 tablet (40 mg total) by mouth daily. 90 tablet 3   potassium chloride SA (KLOR-CON) 20 MEQ tablet Take 1 tablet (20 mEq total) by mouth daily. (Patient taking differently: Take 20 mEq by mouth 2 (two) times a week.) 90 tablet 11   RYBELSUS 14 MG TABS Take 1 tablet by mouth daily.     tadalafil (CIALIS) 5 MG tablet Take 5 mg by mouth daily.     Current Facility-Administered Medications on File Prior to Visit  Medication Dose Route Frequency Provider Last Rate Last Admin   gemcitabine (GEMZAR) chemo syringe for bladder instillation 2,000 mg  2,000 mg Bladder Instillation Once Bjorn Pippin, MD        Allergies:  Allergies  Allergen Reactions   Tamsulosin Other (See Comments)    Unable to perform sexually   Codeine Itching and Rash   Wound Dressing Adhesive Itching and Other (See Comments)    Band-Aid adhesive    Vital Signs:  BP (!) 187/98   Pulse 81   Ht 5\' 6"  (1.676 m)   Wt 182 lb (82.6 kg)    SpO2 98%   BMI 29.38 kg/m    Neurological Exam: MENTAL STATUS including orientation to time, place, person, recent and remote memory, attention span and concentration, language, and fund of knowledge is normal.  Speech is not dysarthric.  CRANIAL NERVES:  Pupils equal round and reactive to light.  Normal conjugate, extra-ocular eye movements in all directions of gaze.  No ptosis.  Face is symmetric.   Upper Extremity:  Right   Left  Deltoid  5/5    5/5   Biceps  5/5    5/5   Triceps  5/5    5/5   Wrist extensors * 5/5    5/5  Wrist flexors  5/5    5/5   Finger extensors  1/5    5/5   Finger flexors  4+/5    5/5   Dorsal interossei  4/5    5/5   Abductor pollicis  4/5    5/5   Tone (Ashworth scale)  0   0    Lower Extremity:  Right   Left  Hip flexors  5/5    5/5   Knee flexors  5/5    5/5   Knee extensors  5/5    5/5   Dorsiflexors  5/5    5/5   Plantarflexors  5/5    5/5   Toe extensors  5/5    5/5   Toe flexors  5/5    5/5   Tone (Ashworth scale)  0   0    MSRs:                                              Right        Left brachioradialis 2+   2+  biceps 2+   2+  triceps 2+   2+  patellar 2+   2+  ankle jerk 1+   1+  Hoffman no   no  plantar response down   down   COORDINATION/GAIT:    Gait narrow based and stable.   Data: MRI cervical spine wo contrast 07/03/2023: 1. 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 2. C5-6: Spondylosis with endplate osteophytes and protruding disc material more prominent in the right posterolateral direction. Effacement of the subarachnoid space with indentation of the cord, particularly on the right. AP diameter of the canal in the midline 7.4 mm. Foraminal stenosis right worse than left. In particular, the right C6 nerve root is likely affected. 3. C6-7: Endplate osteophytes and bulging of the disc more prominent towards the right. Moderate foraminal narrowing on  the right that could possibly affect the C7 nerve. This is not as severe as the level above.   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 Additionally, there is evidence of a chronic C7 radiculopathy (mild) and left ulnar neuropathy across the elbow (very mild).    Thank you for allowing me to participate in patient's care.  If I can answer any additional questions, I would be pleased to do so.    Sincerely,    [employer] K. Allena Katz, DO

## 2024-01-20 ENCOUNTER — Other Ambulatory Visit (HOSPITAL_COMMUNITY): Payer: Self-pay | Admitting: Student

## 2024-01-20 DIAGNOSIS — N401 Enlarged prostate with lower urinary tract symptoms: Secondary | ICD-10-CM

## 2024-01-20 MED ORDER — CIPROFLOXACIN HCL 500 MG PO TABS: 500.0000 mg | ORAL_TABLET | Freq: Two times a day (BID) | ORAL | 0 refills | Status: AC

## 2024-01-20 MED ORDER — SOLIFENACIN SUCCINATE 5 MG PO TABS: 5.0000 mg | ORAL_TABLET | Freq: Every day | ORAL | 0 refills | Status: AC

## 2024-01-20 MED ORDER — PHENAZOPYRIDINE HCL 100 MG PO TABS: 100.0000 mg | ORAL_TABLET | Freq: Three times a day (TID) | ORAL | 0 refills | Status: AC

## 2024-01-20 NOTE — Progress Notes (Signed)
 Patient scheduled for prostate artery embolization 01/21/24 with Dr. Elby Showers. I called the patient to discuss tomorrow's procedure and to notify him that I sent in 3 post-procedure medications to his [facility]. Voice message left. Patient requested to communicate via text and I shared with him the pertinent procedure information - NPO at midnight, ok to take necessary medications with small sips of water, arrive to the hospital at 10 am for his noon procedure, have a driver and someone that can stay with him over night. Patient encouraged to pick up his post-procedure medications today if possible.   Patient has my number and knows that he can call/text me with any questions or concerns prior to his procedure tomorrow.  Alwyn Ren, AGACNP-BC 01/20/2024, 10:25 AM

## 2024-01-21 ENCOUNTER — Other Ambulatory Visit (HOSPITAL_COMMUNITY): Payer: Self-pay | Admitting: Interventional Radiology

## 2024-01-21 ENCOUNTER — Ambulatory Visit (HOSPITAL_COMMUNITY)
Admission: RE | Admit: 2024-01-21 | Discharge: 2024-01-21 | Disposition: A | Discharge status: Home / Self Care | Source: Ambulatory Visit | Attending: Interventional Radiology | Admitting: Interventional Radiology

## 2024-01-21 ENCOUNTER — Other Ambulatory Visit: Payer: Self-pay

## 2024-01-21 DIAGNOSIS — F1290 Cannabis use, unspecified, uncomplicated: Secondary | ICD-10-CM | POA: Diagnosis not present

## 2024-01-21 DIAGNOSIS — N138 Other obstructive and reflux uropathy: Secondary | ICD-10-CM

## 2024-01-21 DIAGNOSIS — E119 Type 2 diabetes mellitus without complications: Secondary | ICD-10-CM | POA: Insufficient documentation

## 2024-01-21 DIAGNOSIS — I2510 Atherosclerotic heart disease of native coronary artery without angina pectoris: Secondary | ICD-10-CM | POA: Diagnosis not present

## 2024-01-21 DIAGNOSIS — N401 Enlarged prostate with lower urinary tract symptoms: Secondary | ICD-10-CM | POA: Insufficient documentation

## 2024-01-21 DIAGNOSIS — Z7984 Long term (current) use of oral hypoglycemic drugs: Secondary | ICD-10-CM | POA: Insufficient documentation

## 2024-01-21 DIAGNOSIS — R338 Other retention of urine: Secondary | ICD-10-CM | POA: Diagnosis not present

## 2024-01-21 DIAGNOSIS — I10 Essential (primary) hypertension: Secondary | ICD-10-CM | POA: Insufficient documentation

## 2024-01-21 DIAGNOSIS — Z8551 Personal history of malignant neoplasm of bladder: Secondary | ICD-10-CM | POA: Diagnosis not present

## 2024-01-21 DIAGNOSIS — F17210 Nicotine dependence, cigarettes, uncomplicated: Secondary | ICD-10-CM | POA: Diagnosis not present

## 2024-01-21 DIAGNOSIS — Z79899 Other long term (current) drug therapy: Secondary | ICD-10-CM | POA: Diagnosis not present

## 2024-01-21 DIAGNOSIS — Z85528 Personal history of other malignant neoplasm of kidney: Secondary | ICD-10-CM | POA: Diagnosis not present

## 2024-01-21 HISTORY — PX: IR ANGIOGRAM SELECTIVE EACH ADDITIONAL VESSEL: IMG667

## 2024-01-21 HISTORY — PX: IR ANGIOGRAM PELVIS SELECTIVE OR SUPRASELECTIVE: IMG661

## 2024-01-21 HISTORY — PX: IR US GUIDE VASC ACCESS RIGHT: IMG2390

## 2024-01-21 HISTORY — PX: IR US GUIDE VASC ACCESS LEFT: IMG2389

## 2024-01-21 HISTORY — PX: IR EMBO TUMOR ORGAN ISCHEMIA INFARCT INC GUIDE ROADMAPPING: IMG5449

## 2024-01-21 LAB — BASIC METABOLIC PANEL WITH GFR
Anion gap: 9 (ref 5–15)
BUN: 14 mg/dL (ref 8–23)
CO2: 24 mmol/L (ref 22–32)
Calcium: 9.1 mg/dL (ref 8.9–10.3)
Chloride: 106 mmol/L (ref 98–111)
Creatinine, Ser: 0.74 mg/dL (ref 0.61–1.24)
GFR, Estimated: >60 mL/min (ref >60)
Glucose, Bld: 206 mg/dL — ABNORMAL HIGH (ref 70–99)
Potassium: 3.6 mmol/L (ref 3.5–5.1)
Sodium: 139 mmol/L (ref 135–145)

## 2024-01-21 LAB — PROTIME-INR
INR: 1.1 (ref 0.8–1.2)
Prothrombin Time: 14.2 s (ref 11.4–15.2)

## 2024-01-21 LAB — GLUCOSE, CAPILLARY: Glucose-Capillary: 213 mg/dL — ABNORMAL HIGH (ref 70–99)

## 2024-01-21 LAB — CBC
HCT: 48 % (ref 39.0–52.0)
Hemoglobin: 16.6 g/dL (ref 13.0–17.0)
MCH: 28.9 pg (ref 26.0–34.0)
MCHC: 34.6 g/dL (ref 30.0–36.0)
MCV: 83.6 fL (ref 80.0–100.0)
Platelets: 166 10*3/uL (ref 150–400)
RBC: 5.74 MIL/uL (ref 4.22–5.81)
RDW: 14.2 % (ref 11.5–15.5)
WBC: 7.9 10*3/uL (ref 4.0–10.5)
nRBC: 0 % (ref 0.0–0.2)

## 2024-01-21 MED ORDER — LIDOCAINE HCL 1 % IJ SOLN
20.0000 mL | Freq: Once | INTRAMUSCULAR | Status: AC
  Administered 2024-01-21: 10 mL via INTRADERMAL

## 2024-01-21 MED ORDER — PREDNISONE 20 MG PO TABS
20.0000 mg | ORAL_TABLET | Freq: Once | ORAL | Status: AC
  Administered 2024-01-21: 20 mg via ORAL
  Filled 2024-01-21: qty 1

## 2024-01-21 MED ORDER — MIDAZOLAM HCL 2 MG/2ML IJ SOLN
INTRAMUSCULAR | Status: AC
  Filled 2024-01-21: qty 4

## 2024-01-21 MED ORDER — CHLORHEXIDINE GLUCONATE CLOTH 2 % EX PADS: 6.0000 | MEDICATED_PAD | Freq: Every day | CUTANEOUS | Status: DC

## 2024-01-21 MED ORDER — HEPARIN SODIUM (PORCINE) 1000 UNIT/ML IJ SOLN
INTRAMUSCULAR | Status: AC
  Filled 2024-01-21: qty 10

## 2024-01-21 MED ORDER — LIDOCAINE HCL URETHRAL/MUCOSAL 2 % EX GEL
1.0000 | Freq: Once | CUTANEOUS | Status: DC
  Filled 2024-01-21: qty 6

## 2024-01-21 MED ORDER — IODIXANOL 320 MG/ML IV SOLN
100.0000 mL | Freq: Once | INTRAVENOUS | Status: AC | PRN
  Administered 2024-01-21: 41 mL via INTRAVENOUS

## 2024-01-21 MED ORDER — VERAPAMIL HCL 2.5 MG/ML IV SOLN: INTRA_ARTERIAL | Status: AC | PRN

## 2024-01-21 MED ORDER — FENTANYL CITRATE (PF) 100 MCG/2ML IJ SOLN
INTRAMUSCULAR | Status: AC
  Filled 2024-01-21: qty 4

## 2024-01-21 MED ORDER — LIDOCAINE-PRILOCAINE 2.5-2.5 % EX CREA
TOPICAL_CREAM | Freq: Once | CUTANEOUS | Status: AC
Start: 2024-01-21 — End: 2024-01-21
  Filled 2024-01-21: qty 5

## 2024-01-21 MED ORDER — LIDOCAINE HCL 1 % IJ SOLN
INTRAMUSCULAR | Status: AC
  Filled 2024-01-21: qty 20

## 2024-01-21 MED ORDER — NITROGLYCERIN 2 % TD OINT
1.0000 [in_us] | TOPICAL_OINTMENT | Freq: Once | TRANSDERMAL | Status: AC
  Administered 2024-01-21: 1 [in_us] via TOPICAL
  Filled 2024-01-21: qty 1

## 2024-01-21 MED ORDER — FENTANYL CITRATE (PF) 100 MCG/2ML IJ SOLN
INTRAMUSCULAR | Status: AC | PRN
  Administered 2024-01-21 (×3): 25 ug via INTRAVENOUS
  Administered 2024-01-21: 50 ug via INTRAVENOUS
  Administered 2024-01-21 (×3): 25 ug via INTRAVENOUS

## 2024-01-21 MED ORDER — VERAPAMIL HCL 2.5 MG/ML IV SOLN
INTRAVENOUS | Status: AC
  Filled 2024-01-21: qty 2

## 2024-01-21 MED ORDER — NITROGLYCERIN 1 MG/10 ML FOR IR/CATH LAB
INTRA_ARTERIAL | Status: AC
  Filled 2024-01-21: qty 10

## 2024-01-21 MED ORDER — FENTANYL CITRATE (PF) 100 MCG/2ML IJ SOLN
INTRAMUSCULAR | Status: AC
  Filled 2024-01-21: qty 2

## 2024-01-21 MED ORDER — SODIUM CHLORIDE (PF) 0.9 % IJ SOLN
INTRAVENOUS | Status: AC | PRN
  Administered 2024-01-21 (×3): 100 ug via INTRA_ARTERIAL

## 2024-01-21 MED ORDER — CIPROFLOXACIN IN D5W 400 MG/200ML IV SOLN
400.0000 mg | INTRAVENOUS | Status: AC
  Administered 2024-01-21: 400 mg via INTRAVENOUS
  Filled 2024-01-21: qty 200

## 2024-01-21 MED ORDER — MIDAZOLAM HCL 2 MG/2ML IJ SOLN
INTRAMUSCULAR | Status: AC
  Filled 2024-01-21: qty 2

## 2024-01-21 MED ORDER — MIDAZOLAM HCL 2 MG/2ML IJ SOLN
INTRAMUSCULAR | Status: AC | PRN
  Administered 2024-01-21 (×3): .5 mg via INTRAVENOUS
  Administered 2024-01-21: 1 mg via INTRAVENOUS
  Administered 2024-01-21 (×3): .5 mg via INTRAVENOUS

## 2024-01-21 NOTE — Progress Notes (Signed)
 TR band removed at 1645, gauze dressing applied, left radial level 0, clean, dry, and intact.

## 2024-01-21 NOTE — H&P (Signed)
 Chief Complaint: Benign prostatic hyperplastic with lower urinary tract symptoms  Referring Provider(s): Wrenn,John   Supervising Physician: Marliss Coots  Patient Status: [name] - Out-pt  History of Present Illness: [name] [name] is a 68 y.o. male medical history significant for CAD, DM2, HTN, THC use disorder, right renal cell carcinoma, recurrent bladder cancer (s/p TURBT and BCG therapy), and BPH. He struggles with incomplete bladder emptying, difficulty postponing urination and a weak stream. He was taking alfuzosin and finasteride but discontinued these drugs due to a lack of benefit. Dr. Annabell Howells referred patient to IR, who ultimately decided on prostate artery embolization.   Patient is Full Code  Past Medical History:  Diagnosis Date   Benign localized prostatic hyperplasia with lower urinary tract symptoms (LUTS)    urologist--- dr Annabell Howells   Bladder cancer [facility])    urologist---- dr Annabell Howells---  s/p TURBT 11/2020; 03/ 2022   Coronary artery disease    mild   DDD (degenerative disc disease), cervical    From 1980s   Diabetes mellitus type II    followed by pcp--   (01-05-2021  per pt checks blood sugar three times per week,  fasting sugar --- 130--140_   Difficult intubation 07/28/2012   partial nephrectomy done @[facility]  07-28-2012  found to have anterior larynx , diffcult intubation, referred to anesthesia record in epic    Dyslipidemia    ED (erectile dysfunction)    Elevated PSA    GERD (gastroesophageal reflux disease)    History of adenomatous polyp of colon    History of kidney stones    History of prostatitis 2008   History of renal carcinoma urologist--- dr Annabell Howells   07-28-2012 for renal neoplasm  s/p right partal nephrectomy    Hypertension    followed by pcp   Neuromuscular disorder (HCC)    Osteoarthritis    Back   Renal carcinoma, right [facility])     Past Surgical History:  Procedure Laterality Date   CATARACT EXTRACTION     COLONOSCOPY N/A 03/03/2013    Procedure: COLONOSCOPY;  Surgeon: Hilarie Fredrickson, MD;  Location: [facility];  Service: Endoscopy;  Laterality: N/A;   COLONOSCOPY WITH PROPOFOL N/A 09/29/2019   Procedure: COLONOSCOPY WITH PROPOFOL;  Surgeon: Hilarie Fredrickson, MD;  Location: [facility];  Service: Endoscopy;  Laterality: N/A;   CYSTOSCOPY WITH BIOPSY N/A 12/09/2020   Procedure: CYSTOSCOPY WITH TURBT, BIOPSY AND FULGURATION;  Surgeon: Bjorn Pippin, MD;  Location: [facility];  Service: Urology;  Laterality: N/A;  REQUESTING 45 MINS   EXTRACORPOREAL SHOCK WAVE LITHOTRIPSY  01-10-2015;  03-03-2012  @[facility]    IR RADIOLOGIST EVAL & MGMT  12/23/2023   POLYPECTOMY  09/29/2019   Procedure: POLYPECTOMY;  Surgeon: Hilarie Fredrickson, MD;  Location: [facility];  Service: Endoscopy;;   ROBOT ASSISTED LAPAROSCOPIC PARTIAL NEPHRECTOMY  07-28-2012   DR Laverle Patter  @[facility]    TONSILLECTOMY  1965   TRANSURETHRAL RESECTION OF BLADDER TUMOR N/A 11/27/2023   Procedure: TRANSURETHRAL RESECTION OF BLADDER TUMOR (TURBT), BILATERAL RETROGRADE;  Surgeon: Bjorn Pippin, MD;  Location: [facility];  Service: Urology;  Laterality: N/A;  60 MINUTE CASE   TRANSURETHRAL RESECTION OF BLADDER TUMOR WITH MITOMYCIN-C N/A 01/06/2021   Procedure: TRANSURETHRAL RESECTION OF BLADDER TUMOR WITH GEMCITIBINE IN PACU;  Surgeon: Bjorn Pippin, MD;  Location: [facility];  Service: Urology;  Laterality: N/A;   TRANSURETHRAL RESECTION OF BLADDER TUMOR WITH MITOMYCIN-C N/A 09/01/2021   Procedure: TRANSURETHRAL RESECTION OF BLADDER TUMOR WITH POST OPERATIVE INSTILLATION OF  GEMCITABINE;  Surgeon: Bjorn Pippin, MD;  Location: [facility];  Service: Urology;  Laterality: N/A;   TRANSURETHRAL RESECTION OF BLADDER TUMOR WITH MITOMYCIN-C Bilateral 11/13/2022   Procedure: CYSTOSCOPY TRANSURETHRAL RESECTION OF BLADDER TUMOR BILATERAL RETROGRADES WITH INSTILL GEMCTABINE;  Surgeon: Bjorn Pippin, MD;  Location: [facility];  Service: Urology;  Laterality: Bilateral;  1 HR FOR CASE     Allergies: Tamsulosin, Codeine, and Wound dressing adhesive  Medications: Prior to Admission medications   Medication Sig Start Date End Date Taking? Authorizing Provider  amLODipine (NORVASC) 5 MG tablet Take 1 tablet (5 mg total) by mouth daily. 09/18/23  Yes Plotnikov, Georgina Quint, MD  Ascorbic Acid (VITAMIN C) 1000 MG tablet Take 1,000 mg by mouth once a week.   Yes [provider]  cholecalciferol (VITAMIN D) 1000 UNITS tablet Take 1,000 Units by mouth daily.   Yes [provider]  ciprofloxacin (CIPRO) 500 MG tablet Take 1 tablet (500 mg total) by mouth 2 (two) times daily for 7 days. 01/20/24 01/27/24 Yes Covington, Arman Filter, NP  cyclobenzaprine (FLEXERIL) 5 MG tablet TAKE 1 TABLET BY MOUTH THREE TIMES A DAY AS NEEDED FOR MUSCLE SPASMS 09/29/23  Yes Plotnikov, Georgina Quint, MD  DULoxetine (CYMBALTA) 30 MG capsule Take 1 capsule (30 mg total) by mouth at bedtime. 01/15/24  Yes Patel, Donika K, DO  glimepiride (AMARYL) 4 MG tablet Take 1 tablet (4 mg total) by mouth in the morning and at bedtime. 04/23/23  Yes Plotnikov, Georgina Quint, MD  HYDROcodone-acetaminophen (NORCO) 10-325 MG tablet Take 1 tablet by mouth every 6 (six) hours as needed for severe pain (pain score 7-10). 11/27/23  Yes Bjorn Pippin, MD  metFORMIN (GLUCOPHAGE) 500 MG tablet Take 1 tablet (500 mg total) by mouth 2 (two) times daily with a meal. 11/04/23  Yes Burns, Bobette Mo, MD  olmesartan (BENICAR) 40 MG tablet Take 1 tablet (40 mg total) by mouth daily. 09/18/23  Yes Plotnikov, Georgina Quint, MD  phenazopyridine (PYRIDIUM) 100 MG tablet Take 1 tablet (100 mg total) by mouth in the morning, at noon, and at bedtime for 7 days. 01/20/24 01/27/24 Yes Covington, Arman Filter, NP  potassium chloride SA (KLOR-CON) 20 MEQ tablet Take 1 tablet (20 mEq total) by mouth daily. Patient taking differently: Take 20 mEq by mouth 2 (two) times a week. 09/06/20  Yes Plotnikov, Georgina Quint, MD  RYBELSUS 14 MG TABS Take 1 tablet by mouth daily. 03/22/23   Yes [provider]  solifenacin (VESICARE) 5 MG tablet Take 1 tablet (5 mg total) by mouth daily for 7 days. 01/20/24 01/27/24 Yes Covington, Arman Filter, NP  tadalafil (CIALIS) 5 MG tablet Take 5 mg by mouth daily. 09/02/20  Yes [provider]  blood glucose meter kit and supplies KIT Use to test blood sugar once a day. DX: E11.9 12/19/16   Plotnikov, Georgina Quint, MD  Blood Glucose Monitoring Suppl (CONTOUR NEXT MONITOR) w/Device KIT 1 Device by Does not apply route daily. 12/08/19   Plotnikov, Georgina Quint, MD  clobetasol (TEMOVATE) 0.05 % external solution Apply 1 Application topically 2 (two) times daily as needed. Itchy scalp 01/23/23   Plotnikov, Georgina Quint, MD  CONTOUR NEXT TEST test strip USE AS DIRECTED 03/21/20   Plotnikov, Georgina Quint, MD  doxycycline (VIBRA-TABS) 100 MG tablet Take 1 tablet (100 mg total) by mouth 2 (two) times daily. 12/27/23   Plotnikov, Georgina Quint, MD  ibuprofen (ADVIL) 800 MG tablet TAKE 1 TABLET (800 MG TOTAL) BY MOUTH DAILY  AS NEEDED FOR MODERATE PAIN. 09/03/23   Plotnikov, Georgina Quint, MD  Lancets MISC Use to check blood sugar once per day. 12/19/16   Plotnikov, Georgina Quint, MD  mupirocin ointment (BACTROBAN) 2 % On leg wound w/dressing change qd or bid 12/27/23   Plotnikov, Georgina Quint, MD     Family History  Problem Relation Age of Onset   Lymphoma Mother    Colon cancer Mother 20   Dementia Father    Colon cancer Paternal Aunt    Esophageal cancer Neg Hx    Rectal cancer Neg Hx    Stomach cancer Neg Hx     Social History   Socioeconomic History   Marital status: Married    Spouse name: Not on file   Number of children: Not on file   Years of education: Not on file   Highest education level: Some college, no degree  Occupational History   Not on file  Tobacco Use   Smoking status: Every Day    Current packs/day: 0.25    Average packs/day: 0.3 packs/day for 30.0 years (7.5 ttl pk-yrs)    Types: Cigarettes   Smokeless tobacco: Never   Tobacco comments:     1 pack every 2 days  Vaping Use   Vaping status: Never Used  Substance and Sexual Activity   Alcohol use: Not Currently   Drug use: Yes    Types: Marijuana    Comment: Smokes marijuana every day   Sexual activity: Yes  Other Topics Concern   Not on file  Social History Narrative   Occupation: printerCurrent smokerRegular exercise- noMarried      Are you right handed or left handed? Both right and left    Are you currently employed ?    What is your current occupation?   Do you live at home alone? No    Who lives with you? Wife    What type of home do you live in: 1 story or 2 story? Lives in one story home      Social Drivers of Health   Financial Resource Strain: Medium Risk (05/13/2023)   Overall Financial Resource Strain (CARDIA)    Difficulty of Paying Living Expenses: Somewhat hard  Food Insecurity: No Food Insecurity (05/13/2023)   Hunger Vital Sign    Worried About Running Out of Food in the Last Year: Never true    Ran Out of Food in the Last Year: Never true  Transportation Needs: No Transportation Needs (05/13/2023)   PRAPARE - Administrator, Civil Service (Medical): No    Lack of Transportation (Non-Medical): No  Physical Activity: Insufficiently Active (05/13/2023)   Exercise Vital Sign    Days of Exercise per Week: 1 day    Minutes of Exercise per Session: 10 min  Stress: No Stress Concern Present (05/13/2023)   [employer] of Occupational Health - Occupational Stress Questionnaire    Feeling of Stress : Not at all  Social Connections: Moderately Integrated (05/13/2023)   Social Connection and Isolation Panel [NHANES]    Frequency of Communication with Friends and Family: More than three times a week    Frequency of Social Gatherings with Friends and Family: More than three times a week    Attends Religious Services: 1 to 4 times per year    Active Member of [employer] or Organizations: No    Attends Engineer, structural: Not on file     Marital Status: Married     Review of  Systems: A 12 point ROS discussed and pertinent positives are indicated in the HPI above.  All other systems are negative.  Review of Systems  Genitourinary:  Positive for difficulty urinating (chronic).    Vital Signs: BP 125/84   Pulse 78   Temp 98.7 F (37.1 C) (Oral)   Resp 20   Ht 5\' 6"  (1.676 m)   Wt 174 lb (78.9 kg)   SpO2 95%   BMI 28.08 kg/m   Advance Care Plan: No documents on file  Physical Exam Vitals and nursing note reviewed.  Constitutional:      General: He is not in acute distress. HENT:     Mouth/Throat:     Mouth: Mucous membranes are moist.     Pharynx: Oropharynx is clear.  Cardiovascular:     Rate and Rhythm: Normal rate and regular rhythm.  Pulmonary:     Effort: Pulmonary effort is normal.     Breath sounds: Normal breath sounds.  Abdominal:     Palpations: Abdomen is soft.     Tenderness: There is no abdominal tenderness. There is no guarding or rebound.  Musculoskeletal:     Right lower leg: No edema.     Left lower leg: No edema.  Skin:    General: Skin is warm and dry.  Neurological:     Mental Status: He is alert and oriented to person, place, and time. Mental status is at baseline.     Imaging: CT ANGIO PELVIS W OR WO CONTRAST Result Date: 12/27/2023 CLINICAL DATA:  68 year old male with benign prostatic hyperplasia and lower urinary tract symptoms. Preoperative planning study prior to prostatic artery embolization procedure. EXAM: CT ANGIOGRAPHY OF PELVIS TECHNIQUE: Multidetector CT imaging of the pelvis was performed using the standard protocol during bolus administration of intravenous contrast. Multiplanar reconstructed images and MIPs were obtained and reviewed to evaluate the vascular anatomy. RADIATION DOSE REDUCTION: This exam was performed according to the departmental dose-optimization program which includes automated exposure control, adjustment of the mA and/or kV according to patient  size and/or use of iterative reconstruction technique. CONTRAST:  ISOVUE-370 IOPAMIDOL (ISOVUE-370) INJECTION 76% COMPARISON:  None Available. FINDINGS: VASCULAR Aorta: Heterogeneous atherosclerotic plaque visible in the distal abdominal aorta. No evidence of stenosis, occlusion, aneurysm or dissection. IMA: Partially imaged.  Patent and unremarkable. Inflow: Mild scattered atherosclerotic plaque without significant stenosis or occlusion. Vascularity is relatively mild. Both hypogastric arteries are widely patent. On the left, the prostatic artery is well visualized and arises proximally from a common gluteal pudendal trunk. The prostatic artery arises via a common trunk with the superior vesicular artery. On the right, the anatomy is less clear. The prostatic artery is smaller and not as easy to visualize. It appears to arise proximally from the anterior division although there is fibrofatty atherosclerotic plaque resulting in some degree of stenosis. The prostatic artery appears to arise from a common origin of the internal pudendal and prostatic arteries. The obturator artery arises independently. The posterior division gives rise to both the superior and inferior gluteal arteries. Proximal Outflow: Bilateral common femoral and visualized portions of the superficial and profunda femoral arteries are patent without evidence of aneurysm, dissection, vasculitis or significant stenosis. Veins: No focal venous abnormality. Review of the MIP images confirms the above findings. NON-VASCULAR Urinary Tract: The visualized lower poles of the kidneys, ureters are unremarkable. The bladder is thick walled consistent with trabeculation. Stomach/Bowel: Colonic diverticular disease without CT evidence of active inflammation. No focal bowel wall thickening or  evidence of obstruction. Lymphatic: No suspicious lymphadenopathy. Reproductive: Prostatomegaly. The prostate gland measures grossly 6.8 x 4.8 x 5.5 cm (volume = 94  cm^3). Other: No abdominal wall hernia or abnormality. No abdominopelvic ascites. Musculoskeletal: No acute fracture or aggressive appearing lytic or blastic osseous lesion. IMPRESSION: VASCULAR 1. Dominant left prostatic artery which is well visualized and arises proximally from a common gluteal pudendal trunk. 2. Less robust right prostatic artery which is more difficult to visualize and arises from a common prostatic open dental trunk. There appears to be fibrofatty atherosclerotic plaque in the region of the vessel origin. 3. Mild iliac artery tortuosity and atherosclerotic plaque. 4. No access artery issues. NON-VASCULAR 1. Prostatomegaly.  The prostate is approximately 94 g. 2. Colonic diverticular disease without CT evidence of active inflammation. 3. Bladder wall trabeculation consistent with the clinical history of BPH with LUTS. Electronically Signed   By: Malachy Moan M.D.   On: 12/27/2023 15:58   IR Radiologist Eval & Mgmt Result Date: 12/23/2023 EXAM: NEW PATIENT OFFICE VISIT CHIEF COMPLAINT: See Epic note. HISTORY OF PRESENT ILLNESS: See Epic note. REVIEW OF SYSTEMS: See Epic note. PHYSICAL EXAMINATION: See Epic note. ASSESSMENT AND PLAN: See Epic note. Marliss Coots, MD [facility] Electronically Signed   By: Marliss Coots M.D.   On: 12/23/2023 09:16    Labs:  CBC: Recent Labs    02/05/23 1005 02/12/23 1509 11/22/23 1439 01/21/24 1116  WBC 8.6 9.9 7.8 7.9  HGB 15.6 16.2 16.3 16.6  HCT 45.3 47.1 47.5 48.0  PLT 154.0 186.0 163 166    COAGS: Recent Labs    01/21/24 1116  INR 1.1    BMP: Recent Labs    02/12/23 1509 04/22/23 0954 08/26/23 1135 11/22/23 1439  NA 140 139 139 139  K 3.6 4.2 3.4* 2.9*  CL 101 103 104 106  CO2 33* 28 27 25   GLUCOSE 145* 234* 183* 213*  BUN 12 17 15 13   CALCIUM 9.9 9.6 9.4 9.4  CREATININE 0.94 0.92 0.71 0.75  GFRNONAA  --   --   --  >60    LIVER FUNCTION TESTS: Recent  Labs    01/22/23 0828 02/05/23 1005 02/12/23 1509 07/31/23 1058 08/26/23 1135  BILITOT 0.4 0.5 0.5  --  0.6  AST 13 11 15   --  13  ALT 31 24 29   --  24  ALKPHOS 108 83 85  --  97  PROT 6.5 5.9* 6.5 6.3 6.8  ALBUMIN 4.2 3.7 3.9  --  4.3    TUMOR MARKERS: No results for input(s): "AFPTM", "CEA", "CA199", "CHROMGRNA" in the last 8760 hours.  Assessment and Plan:  [name] [name] is a 68 y.o. male medical history significant for CAD, DM2, HTN, THC use disorder, right renal cell carcinoma, recurrent bladder cancer (s/p TURBT and BCG therapy), and BPH. He struggles with incomplete bladder emptying, difficulty postponing urination and a weak stream. He was taking alfuzosin and finasteride but discontinued these drugs due to a lack of benefit. Dr. Annabell Howells referred patient to IR, who ultimately decided on prostate artery embolization.  The Risks and benefits of prostate artery embolization were discussed with the patient including, but not limited to bleeding, infection, vascular injury, post operative pain, or contrast induced renal failure.  This procedure involves the use of X-rays and because of the nature of the planned procedure, it is possible that we will have prolonged use of X-ray fluoroscopy.  Potential radiation risks to  you include (but are not limited to) the following: - A slightly elevated risk for cancer several years later in life. This risk is typically less than 0.5% percent. This risk is low in comparison to the normal incidence of human cancer, which is 33% for women and 50% for men according to the American Cancer Society. - Radiation induced injury can include skin redness, resembling a rash, tissue breakdown / ulcers and hair loss (which can be temporary or permanent).   The likelihood of either of these occurring depends on the difficulty of the procedure and whether you are sensitive to radiation due to previous procedures, disease, or genetic conditions.   IF your  procedure requires a prolonged use of radiation, you will be notified and given written instructions for further action.  It is your responsibility to monitor the irradiated area for the 2 weeks following the procedure and to notify your physician if you are concerned that you have suffered a radiation induced injury.    All of the patient's questions were answered, patient is agreeable to proceed. Consent signed and in chart.   Thank you for allowing our service to participate in [name] [name] 's care.  Electronically Signed: Katheren Puller, PA-C   01/21/2024, 11:47 AM      I spent a total of  30 Minutes   in face to face in clinical consultation, greater than 50% of which was counseling/coordinating care for prostate artery embolization.

## 2024-01-21 NOTE — Procedures (Signed)
 Interventional Radiology Procedure Note  Procedure: Prostate artery embolization  Findings: Please refer to procedural dictation for full description. Left radial access, TR band applied with 13 cc at 15:00.  Complications: None immediate  Estimated Blood Loss: <5 mL  Recommendations: Foley out. IR will arrange 1 month clinic follow up.   Marliss Coots, MD

## 2024-01-21 NOTE — Discharge Instructions (Signed)

## 2024-01-22 ENCOUNTER — Ambulatory Visit: Admitting: Occupational Therapy

## 2024-01-24 ENCOUNTER — Other Ambulatory Visit: Payer: Self-pay | Admitting: Internal Medicine

## 2024-01-29 ENCOUNTER — Ambulatory Visit: Attending: Neurosurgery | Admitting: Occupational Therapy

## 2024-01-29 ENCOUNTER — Other Ambulatory Visit: Payer: Self-pay

## 2024-01-29 DIAGNOSIS — G5631 Lesion of radial nerve, right upper limb: Secondary | ICD-10-CM | POA: Diagnosis not present

## 2024-01-29 DIAGNOSIS — R29898 Other symptoms and signs involving the musculoskeletal system: Secondary | ICD-10-CM | POA: Insufficient documentation

## 2024-01-29 DIAGNOSIS — R208 Other disturbances of skin sensation: Secondary | ICD-10-CM | POA: Insufficient documentation

## 2024-01-29 DIAGNOSIS — R29818 Other symptoms and signs involving the nervous system: Secondary | ICD-10-CM | POA: Diagnosis not present

## 2024-01-29 NOTE — Therapy (Addendum)
 OUTPATIENT OCCUPATIONAL THERAPY NEURO EVALUATION  Patient Name: [name] [name] MRN: [id number] DOB:1956/04/02, 68 y.o., male Today's Date: 01/29/2024  PCP: Tresa Garter, MD  REFERRING PROVIDER: Lovenia Kim, MD   END OF SESSION:  OT End of Session - 01/29/24 1253     Visit Number 1    Number of Visits 3   including eval   Date for OT Re-Evaluation 02/21/24    Authorization Type [employer]    Progress Note Due on Visit 10    OT Start Time 1150    OT Stop Time 1235    OT Time Calculation (min) 45 min    Activity Tolerance Patient tolerated treatment well    Behavior During Therapy [facility] for tasks assessed/performed             Past Medical History:  Diagnosis Date   Benign localized prostatic hyperplasia with lower urinary tract symptoms (LUTS)    urologist--- dr Annabell Howells   Bladder cancer [facility])    urologist---- dr Annabell Howells---  s/p TURBT 11/2020; 03/ 2022   Coronary artery disease    mild   DDD (degenerative disc disease), cervical    From 1980s   Diabetes mellitus type II    followed by pcp--   (01-05-2021  per pt checks blood sugar three times per week,  fasting sugar --- 130--140_   Difficult intubation 07/28/2012   partial nephrectomy done @[facility]  07-28-2012  found to have anterior larynx , diffcult intubation, referred to anesthesia record in epic    Dyslipidemia    ED (erectile dysfunction)    Elevated PSA    GERD (gastroesophageal reflux disease)    History of adenomatous polyp of colon    History of kidney stones    History of prostatitis 2008   History of renal carcinoma urologist--- dr Annabell Howells   07-28-2012 for renal neoplasm  s/p right partal nephrectomy    Hypertension    followed by pcp   Neuromuscular disorder (HCC)    Osteoarthritis    Back   Renal carcinoma, right [facility])    Past Surgical History:  Procedure Laterality Date   CATARACT EXTRACTION     COLONOSCOPY N/A 03/03/2013   Procedure: COLONOSCOPY;  Surgeon: Hilarie Fredrickson, MD;   Location: [facility];  Service: Endoscopy;  Laterality: N/A;   COLONOSCOPY WITH PROPOFOL N/A 09/29/2019   Procedure: COLONOSCOPY WITH PROPOFOL;  Surgeon: Hilarie Fredrickson, MD;  Location: [facility];  Service: Endoscopy;  Laterality: N/A;   CYSTOSCOPY WITH BIOPSY N/A 12/09/2020   Procedure: CYSTOSCOPY WITH TURBT, BIOPSY AND FULGURATION;  Surgeon: Bjorn Pippin, MD;  Location: [facility];  Service: Urology;  Laterality: N/A;  REQUESTING 45 MINS   EXTRACORPOREAL SHOCK WAVE LITHOTRIPSY  01-10-2015;  03-03-2012  @[facility]    IR ANGIOGRAM PELVIS SELECTIVE OR SUPRASELECTIVE  01/21/2024   IR ANGIOGRAM SELECTIVE EACH ADDITIONAL VESSEL  01/21/2024   IR ANGIOGRAM SELECTIVE EACH ADDITIONAL VESSEL  01/21/2024   IR ANGIOGRAM SELECTIVE EACH ADDITIONAL VESSEL  01/21/2024   IR EMBO TUMOR ORGAN ISCHEMIA INFARCT INC GUIDE ROADMAPPING  01/21/2024   IR RADIOLOGIST EVAL & MGMT  12/23/2023   IR US GUIDE VASC ACCESS LEFT  01/21/2024   IR US GUIDE VASC ACCESS LEFT  01/21/2024   IR US GUIDE VASC ACCESS RIGHT  01/21/2024   POLYPECTOMY  09/29/2019   Procedure: POLYPECTOMY;  Surgeon: Hilarie Fredrickson, MD;  Location: [facility];  Service: Endoscopy;;   ROBOT ASSISTED LAPAROSCOPIC PARTIAL NEPHRECTOMY  07-28-2012   DR Laverle Patter  @  [facility]   TONSILLECTOMY  1965   TRANSURETHRAL RESECTION OF BLADDER TUMOR N/A 11/27/2023   Procedure: TRANSURETHRAL RESECTION OF BLADDER TUMOR (TURBT), BILATERAL RETROGRADE;  Surgeon: Homero Luster, MD;  Location: [facility];  Service: Urology;  Laterality: N/A;  60 MINUTE CASE   TRANSURETHRAL RESECTION OF BLADDER TUMOR WITH MITOMYCIN-C N/A 01/06/2021   Procedure: TRANSURETHRAL RESECTION OF BLADDER TUMOR WITH GEMCITIBINE IN PACU;  Surgeon: Homero Luster, MD;  Location: [facility];  Service: Urology;  Laterality: N/A;   TRANSURETHRAL RESECTION OF BLADDER TUMOR WITH MITOMYCIN-C N/A 09/01/2021   Procedure: TRANSURETHRAL RESECTION OF BLADDER TUMOR WITH POST OPERATIVE INSTILLATION OF GEMCITABINE;  Surgeon: Homero Luster, MD;  Location:  [facility];  Service: Urology;  Laterality: N/A;   TRANSURETHRAL RESECTION OF BLADDER TUMOR WITH MITOMYCIN-C Bilateral 11/13/2022   Procedure: CYSTOSCOPY TRANSURETHRAL RESECTION OF BLADDER TUMOR BILATERAL RETROGRADES WITH INSTILL GEMCTABINE;  Surgeon: Homero Luster, MD;  Location: [facility];  Service: Urology;  Laterality: Bilateral;  1 HR FOR CASE   Patient Active Problem List   Diagnosis Date Noted   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/13/2024   History of renal carcinoma    Posterior interosseous mononeuropathy, 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   Exposure to TB 08/26/2023   Cervical radiculitis 04/30/2023   Diabetic neuropathy (HCC) 04/23/2023   Hiccups 02/05/2023   Upper abdominal pain 02/05/2023   Gastroesophageal reflux disease 02/05/2023   EKG abnormalities 02/05/2023   Scalp itch 01/23/2023   Tetrahydrocannabinol (THC) use disorder, moderate, dependence (HCC) 01/23/2023   Polyneuropathy due to type 2 diabetes mellitus (HCC) 10/23/2022   Bladder cancer (HCC) 10/23/2022   Memory problem 07/17/2022   Leg pain, anterior, right 12/26/2021   Gross hematuria 09/16/2021   Statin declined 08/06/2021   Acute abscess of face 08/03/2021   Atherosclerosis of aorta (HCC) 04/26/2021   Coronary atherosclerosis 01/08/2021   Ascending aortic aneurysm (HCC) 01/08/2021   Bladder neck obstruction 10/20/2019   History of colonic polyps    Benign neoplasm of ascending colon    Benign neoplasm of transverse colon    Viral illness 06/16/2019   Exposure to COVID-19 virus 02/02/2019   Cerumen impaction 08/27/2018   Pilonidal cyst 01/18/2017   Neoplasm of uncertain behavior of skin 01/18/2017   Hypokalemia 12/11/2016   Tobacco use disorder 10/17/2016   Well adult exam 06/22/2016   Polycythemia, 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   Abscessed tooth 07/06/2015   Sebaceous cyst 07/06/2015   Common cold virus 12/23/2014   Infected sebaceous cyst  11/09/2014   Dyslipidemia 05/19/2013   URI, acute 09/15/2012   Strep throat 09/15/2012   Renal cell cancer (HCC) 09/02/2012   Adrenal mass (HCC) 06/03/2012   Nephrolithiasis 06/03/2012   Otitis media 01/08/2012   Dark urine 01/08/2012   Decreased vision in both eyes 01/08/2012   Onychomycosis 05/25/2011   DM type 2, controlled, with complication (HCC) 05/23/2010   Keloid scar 03/07/2010   SKIN RASH 03/07/2010   LACERATION, FINGER 09/15/2009   Osteoarthritis 08/15/2009   LOW BACK PAIN 08/15/2009   CELLULITIS AND ABSCESS OF BUTTOCK 03/29/2009   CERVICAL RADICULOPATHY 11/09/2008   WARTS, VIRAL, UNSPECIFIED 10/28/2007   Essential hypertension 06/30/2007   CLUSTER HEADACHE 06/27/2007   PSA, INCREASED 06/27/2007    ONSET DATE: 01/06/2024 (referral date)  REFERRING DIAG: G56.31 (ICD-10-CM) - Posterior interosseous mononeuropathy, right   Per 01/06/24 referral notes: Wrist Drop - Splinting   THERAPY DIAG:  Other disturbances of skin sensation - Plan: Ot plan  of care cert/re-cert  Other symptoms and signs involving the nervous system - Plan: Ot plan of care cert/re-cert  Other symptoms and signs involving the musculoskeletal system - Plan: Ot plan of care cert/re-cert  Rationale for Evaluation and Treatment: Rehabilitation  SUBJECTIVE:   SUBJECTIVE STATEMENT: Pt reported waiting to hear from doctor about potential surgery of R hand. Pt reported unable to lift R fingers off tabletop. Pt reported not completing HEP provided during previous OT POC though continuing to participate in daily activities: "I get everything done." Pt reported he is not going to complete home exercise programs and instead moves hands during daily tasks and while watching TV. Pt reported decreased R hand strength and unable to open hand all the way back up. Pt reported no longer taking Gabapentin and instead taking Cymbalta to manage pain. Pt reported increased symptoms of tingling/shooting pain in R hand. Pt  reported doctor had mentioned splint to help keep fingers in ext so that R fingers "don't lock up." Pt demo'd ability to achieve full digit ext with R hand using AAROM and PROM.   Pt accompanied by: self  PERTINENT HISTORY: Note: Pt previous seen for OT sessions at this clinic to address Neuralgic amyotrophy of right brachial plexus from 07/2023 to 09/2023.  Per 01/15/24 DO Office Visit: "68 y.o. ambidextrous male with diabetes mellitus, hyperlipidemia, hypertension, bladder cancer, CAD, right renal cancer, and GERD returning to the clinic for follow-up of right brachial plexopathy... 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  MRI brachial plexus was normal... He has noticed that he's able to spread the fingers out a little better, but still not able to extend his fingers. He continues to have spells of severe pain and takes gabapentin 100mg , but it makes him sleepy. He is also undergoing evaluation for possible nerve transfer with Dr. Katrinka Blazing (neurosurgery)"    PRECAUTIONS: Pt reported no lifting over 10 lbs  WEIGHT BEARING RESTRICTIONS: No  PAIN:  Are you having pain? Yes: NPRS scale: 1-2/10 Pain location: RUE: dorsal aspect of MCP joints Pain description: "shocks," "jolts," "pulses" sometimes when sleeping, dull pain otherwise Aggravating factors: unknown Relieving factors: hot/cold - e.g. placing hands in warm water  FALLS: Has patient fallen in last 6 months? No  LIVING ENVIRONMENT: Lives with: lives with their family (spouse and 41 yo grandson) Lives in: House/apartment Stairs: Yes: External: ramp to back entrance, front entrance has 5-6 steps Has following equipment at home: Ramped entry, comfort height toilet  PLOF: Independent with basic ADLs   PATIENT GOALS: "to get my hand back working again," "to get my finger function working"  OBJECTIVE:  Note: Objective measures were completed at Evaluation unless otherwise noted.  HAND  DOMINANCE: Ambidextrous  ADLs: Overall ADLs:  Transfers/ambulation related to ADLs: Eating: ind Grooming: ind UB Dressing: ind, no difficulty with buttons/zippers LB Dressing: ind Toileting: ind, sometimes difficulty with zipper on pants Bathing: ind Tub Shower transfers: ind Equipment:  tub shower  IADLs: Shopping: ind for carrying groceries Light housekeeping: ind laundry, vacuuming Meal Prep: ind - uses 2 hands to carry heavy pots/pans Community mobility: currently driving Medication management: ind, pt reported sometimes forgetting to take medications and acknowledged he may benefit from medication organizer  Handwriting:  Pt reported "always been terrible." Not a concern per pt. However, pt reported difficulty stabilizing paper with R hand.  MOBILITY STATUS: ind without A/E.   POSTURE COMMENTS:  Ind sitting balance, leaned heavily to R side while seated in  chair though able to correct to upright seated posture with min v/c  ACTIVITY TOLERANCE: Activity tolerance: Pt reported no changes.   FUNCTIONAL OUTCOME MEASURES: Quick Dash: 15.9% deficit    09/23/23 Quick Dash: 11.36 impairment  UPPER EXTREMITY ROM:    Active ROM Right eval Left eval  Shoulder flexion [facility]  Shoulder abduction    Shoulder adduction    Shoulder extension    Shoulder internal rotation    Shoulder external rotation    Elbow flexion    Elbow extension    Wrist flexion    Wrist extension    Wrist ulnar deviation    Wrist radial deviation    Wrist pronation    Wrist supination    (Blank rows = not tested)  Pt reported pain in wrist when moving wrist.  RUE - digit ext impaired especially middle finger, ring finger, small finger. Pt demo'd ability to achieve full digit ext of middle finger, ring finger, and small finger with AAROM and PROM. RUE digit flex WFL. Decreased RUE thumb ext though pt reported improved since before. Pt demo'd improved WFL ADD/ABD of digits on tabletop. Pt able  to maintain wrist ext and wrist in neutral for at least 30 seconds against gravity.   Per previous 2024 OT POC: decreased wrist extension against gravity, decreased finger extension long, ring, and small finger   HAND FUNCTION: Grip strength: Right: 30, 31 (30.5 lbs average) lbs; Left: 76 lbs  Pt reported RUE strength felt about the same today compared to previous 2024 OT POC.  Per previous 2024 OT POC:  Eval: Grip strength: Right: 21 lbs; Left: 68 lbs  09/04/23 - R: 30# 09/23/23: R: 34#   COORDINATION: 01/29/24 - 9 Hole Peg test: Right: 38 sec; Left: 30 sec  01/29/24 - Box and Blocks: Right: 46 Left: 54  Pt reported feeling coordinated, not having problems with coordination. Pt reported no concerns with RUE coordination. Pt reported good ability to complete all preferred daily activities requiring FM coordination with RUE.  Per previous 2024 OT POC: Baseline: Right 38 blocks, Left 50 blocks 09/04/23 - Right - 37 blocks  SENSATION: Pt reported increased symptoms of tingling/shooting pain in R hand compared to 2024.  EDEMA: None noted  MUSCLE TONE: RUE: decreased R digit ext AROM and LUE: Within functional limits  COGNITION: Overall cognitive status: Within functional limits for tasks assessed  PERCEPTION: Not tested  PRAXIS: Not tested  OBSERVATIONS: Pt ambulated ind without A/E.                                                                                                                             TREATMENT DATE: 01/29/24    Self-Care Based on pt's questions about various topics, OT recommended to pt to f/u with doctors about questions related to current precautions, medications, and surgical options and procedures. Pt verbalized understanding.   OT educated pt on A/E option to use medication organizer to improve  medication management independence. Pt verbalized understanding. OT reiterated education regarding heat/cold modalities to manage symptoms of RUE  discomfort/pain. Pt verbalized understanding.    OT educated pt on UE anatomy, OT role, POC. Pt verbalized understanding. OT provided option to re-establish HEP and educated pt on HEP options, including HEP goal, and purpose of HEP. OT asked if pt would participate in and carryover HEP if OT issued an updated HEP or reviewed previous HEP. Pt politely declined. Pt reported he is not going to complete home exercise programs and instead moves hands during daily tasks and while watching TV. Therefore, per pt request, OT did not include HEP goal.  PATIENT EDUCATION: Education details: see today's tx above Person educated: Patient Education method: Explanation Education comprehension: verbalized understanding  HOME EXERCISE PROGRAM: 01/29/24 - Pt politely declined HEP. Pt reported he is not going to complete home exercise programs and instead moves hands during daily tasks and while watching TV.   Please note: Pt has HEP from previous 2024 OT POC.   GOALS: Goals reviewed with patient? Yes  SHORT TERM GOALS: Target date: 02/21/24  Pt will verbalize understanding of splint options, including fabricated and over-the-counter options. Baseline: new to outpt OT Goal status: INITIAL  2.  Pt will ind don/doff preferred RUE splint. Baseline: new to outpt OT Goal status: INITIAL  3.  Pt will return demo of adaptive strategies and A/E options to improve ind and efficiency for functional ADL/IADL tasks. Baseline: pt reported difficulty stabilizing paper with R hand when handwriting. Pt reported ind with most ADL/IADLs. Goal status: INITIAL  4.  Pt will verbalize understanding of strategies to manage symptoms of RUE. Baseline: pt reported increased shooting pain and tingling sensation of RUE Goal status: INITIAL  ASSESSMENT:  CLINICAL IMPRESSION: Patient is a 68 y.o. male who was seen today for occupational therapy evaluation for Right Posterior interosseous mononeuropathy. Hx includes diabetes  mellitus, hyperlipidemia, hypertension, bladder cancer, CAD, right renal cancer, and GERD, right brachial plexopathy. Patient currently presents near previous level of functioning. Pt demo'd improved FM coordination with RUE based on box&blocks and 9-hole peg results. Pt additionally reported no concerns related to FM coordination for functional tasks. Pt demo'd slightly decreased RUE strength compared to previous OT POC though strength levels generally similar to previous OT POC. Pt reported ind with most daily ADLs/IADLs and reported no concerns about functional tasks except for difficulty stabilizing paper with RUE when handwriting with LUE. However, pt continuing to demo decreased digit ext with RUE. Pt may also benefit from splinting options to improve positioning of RUE and prevent deformity. Pt also reported increased symptoms of tingling/shooting pain in R hand compared to 2024. OT educated pt on option to re-establish HEP and purpose of HEP though pt politely declined HEP.  Pt would benefit from brief skilled OT POC in the outpatient setting to improve understanding of strategies to improve efficiency with handwriting tasks, to promote understanding of RUE splint/brace options, and to improve understanding of adaptive strategies to manage RUE symptoms.   PERFORMANCE DEFICITS: in functional skills including ADLs, IADLs, coordination, dexterity, proprioception, sensation, ROM, strength, flexibility, Fine motor control, Gross motor control, mobility, body mechanics, endurance, and UE functional use, cognitive skills including energy/drive, and psychosocial skills including environmental adaptation.   IMPAIRMENTS: are limiting patient from ADLs, IADLs, rest and sleep, work, leisure, and social participation.   CO-MORBIDITIES: may have co-morbidities  that affects occupational performance. Patient will benefit from skilled OT to address above impairments and improve overall function.  MODIFICATION OR  ASSISTANCE TO COMPLETE EVALUATION: Min-Moderate modification of tasks or assist with assess necessary to complete an evaluation.  OT OCCUPATIONAL PROFILE AND HISTORY: Detailed assessment: Review of records and additional review of physical, cognitive, psychosocial history related to current functional performance.  CLINICAL DECISION MAKING: Moderate - several treatment options, min-mod task modification necessary  REHAB POTENTIAL: Good  EVALUATION COMPLEXITY: Moderate    PLAN:  OT FREQUENCY: 1-2x/week  OT DURATION: 2 weeks (likely anticipate 2 visits)  PLANNED INTERVENTIONS: 40981 OT Re-evaluation, 97535 self care/ADL training, 19147 therapeutic exercise, 97530 therapeutic activity, 97112 neuromuscular re-education, 97140 manual therapy, 97035 ultrasound, 97018 paraffin, 82956 fluidotherapy, 97760 Orthotic Initial, [id number] Prosthetic Initial, [id number] Orthotic/Prosthetic subsequent, passive range of motion, functional mobility training, energy conservation, patient/family education, and DME and/or AE instructions  RECOMMENDED OTHER SERVICES: N/A  CONSULTED AND AGREED WITH PLAN OF CARE: Patient  PLAN FOR NEXT SESSION:  Discuss splint options, including fabricated and over-the-counter options. Discuss adaptive strategies for stabilizing paper with RUE when handwriting Discuss strategies to manage symptoms of tingling/shooting pain  Oakley Bellman, OT 01/29/2024, 4:11 PM

## 2024-02-05 ENCOUNTER — Ambulatory Visit: Admitting: Occupational Therapy

## 2024-02-05 DIAGNOSIS — R29818 Other symptoms and signs involving the nervous system: Secondary | ICD-10-CM

## 2024-02-05 DIAGNOSIS — G5631 Lesion of radial nerve, right upper limb: Secondary | ICD-10-CM | POA: Diagnosis not present

## 2024-02-05 DIAGNOSIS — R29898 Other symptoms and signs involving the musculoskeletal system: Secondary | ICD-10-CM

## 2024-02-05 DIAGNOSIS — R208 Other disturbances of skin sensation: Secondary | ICD-10-CM

## 2024-02-05 NOTE — Therapy (Unsigned)
 OUTPATIENT OCCUPATIONAL THERAPY NEURO TREATMENT  Patient Name: [name] [name] MRN: [id number] DOB:02/06/1956, 68 y.o., male Today's Date: 02/06/2024  PCP: Genia Kettering, MD  REFERRING PROVIDER: Carroll Clamp, MD   END OF SESSION:  OT End of Session - 02/05/24 1630     Visit Number 2    Number of Visits 3   including eval   Date for OT Re-Evaluation 02/21/24    Authorization Type [employer]    Progress Note Due on Visit 10    OT Start Time 1535    OT Stop Time 1616    OT Time Calculation (min) 41 min    Activity Tolerance Patient tolerated treatment well    Behavior During Therapy [facility] for tasks assessed/performed              Past Medical History:  Diagnosis Date   Benign localized prostatic hyperplasia with lower urinary tract symptoms (LUTS)    urologist--- dr Inga Manges   Bladder cancer [facility])    urologist---- dr Inga Manges---  s/p TURBT 11/2020; 03/ 2022   Coronary artery disease    mild   DDD (degenerative disc disease), cervical    From 1980s   Diabetes mellitus type II    followed by pcp--   (01-05-2021  per pt checks blood sugar three times per week,  fasting sugar --- 130--140_   Difficult intubation 07/28/2012   partial nephrectomy done @[facility]  07-28-2012  found to have anterior larynx , diffcult intubation, referred to anesthesia record in epic    Dyslipidemia    ED (erectile dysfunction)    Elevated PSA    GERD (gastroesophageal reflux disease)    History of adenomatous polyp of colon    History of kidney stones    History of prostatitis 2008   History of renal carcinoma urologist--- dr Inga Manges   07-28-2012 for renal neoplasm  s/p right partal nephrectomy    Hypertension    followed by pcp   Neuromuscular disorder (HCC)    Osteoarthritis    Back   Renal carcinoma, right [facility])    Past Surgical History:  Procedure Laterality Date   CATARACT EXTRACTION     COLONOSCOPY N/A 03/03/2013   Procedure: COLONOSCOPY;  Surgeon: Tobin Forts, MD;   Location: [facility];  Service: Endoscopy;  Laterality: N/A;   COLONOSCOPY WITH PROPOFOL  N/A 09/29/2019   Procedure: COLONOSCOPY WITH PROPOFOL ;  Surgeon: Tobin Forts, MD;  Location: [facility];  Service: Endoscopy;  Laterality: N/A;   CYSTOSCOPY WITH BIOPSY N/A 12/09/2020   Procedure: CYSTOSCOPY WITH TURBT, BIOPSY AND FULGURATION;  Surgeon: Homero Luster, MD;  Location: [facility];  Service: Urology;  Laterality: N/A;  REQUESTING 45 MINS   EXTRACORPOREAL SHOCK WAVE LITHOTRIPSY  01-10-2015;  03-03-2012  @[facility]    IR ANGIOGRAM PELVIS SELECTIVE OR SUPRASELECTIVE  01/21/2024   IR ANGIOGRAM SELECTIVE EACH ADDITIONAL VESSEL  01/21/2024   IR ANGIOGRAM SELECTIVE EACH ADDITIONAL VESSEL  01/21/2024   IR ANGIOGRAM SELECTIVE EACH ADDITIONAL VESSEL  01/21/2024   IR EMBO TUMOR ORGAN ISCHEMIA INFARCT INC GUIDE ROADMAPPING  01/21/2024   IR RADIOLOGIST EVAL & MGMT  12/23/2023   IR US  GUIDE VASC ACCESS LEFT  01/21/2024   IR US  GUIDE VASC ACCESS LEFT  01/21/2024   IR US  GUIDE VASC ACCESS RIGHT  01/21/2024   POLYPECTOMY  09/29/2019   Procedure: POLYPECTOMY;  Surgeon: Tobin Forts, MD;  Location: [facility];  Service: Endoscopy;;   ROBOT ASSISTED LAPAROSCOPIC PARTIAL NEPHRECTOMY  07-28-2012   DR Rozanne Corners  @  [facility]   TONSILLECTOMY  1965   TRANSURETHRAL RESECTION OF BLADDER TUMOR N/A 11/27/2023   Procedure: TRANSURETHRAL RESECTION OF BLADDER TUMOR (TURBT), BILATERAL RETROGRADE;  Surgeon: Homero Luster, MD;  Location: [facility];  Service: Urology;  Laterality: N/A;  60 MINUTE CASE   TRANSURETHRAL RESECTION OF BLADDER TUMOR WITH MITOMYCIN -C N/A 01/06/2021   Procedure: TRANSURETHRAL RESECTION OF BLADDER TUMOR WITH GEMCITIBINE IN PACU;  Surgeon: Homero Luster, MD;  Location: [facility];  Service: Urology;  Laterality: N/A;   TRANSURETHRAL RESECTION OF BLADDER TUMOR WITH MITOMYCIN -C N/A 09/01/2021   Procedure: TRANSURETHRAL RESECTION OF BLADDER TUMOR WITH POST OPERATIVE INSTILLATION OF GEMCITABINE ;  Surgeon: Homero Luster, MD;  Location:  [facility];  Service: Urology;  Laterality: N/A;   TRANSURETHRAL RESECTION OF BLADDER TUMOR WITH MITOMYCIN -C Bilateral 11/13/2022   Procedure: CYSTOSCOPY TRANSURETHRAL RESECTION OF BLADDER TUMOR BILATERAL RETROGRADES WITH INSTILL GEMCTABINE;  Surgeon: Homero Luster, MD;  Location: [facility];  Service: Urology;  Laterality: Bilateral;  1 HR FOR CASE   Patient Active Problem List   Diagnosis Date Noted   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/13/2024   History of renal carcinoma    Posterior interosseous mononeuropathy, 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   Exposure to TB 08/26/2023   Cervical radiculitis 04/30/2023   Diabetic neuropathy (HCC) 04/23/2023   Hiccups 02/05/2023   Upper abdominal pain 02/05/2023   Gastroesophageal reflux disease 02/05/2023   EKG abnormalities 02/05/2023   Scalp itch 01/23/2023   Tetrahydrocannabinol (THC) use disorder, moderate, dependence (HCC) 01/23/2023   Polyneuropathy due to type 2 diabetes mellitus (HCC) 10/23/2022   Bladder cancer (HCC) 10/23/2022   Memory problem 07/17/2022   Leg pain, anterior, right 12/26/2021   Gross hematuria 09/16/2021   Statin declined 08/06/2021   Acute abscess of face 08/03/2021   Atherosclerosis of aorta (HCC) 04/26/2021   Coronary atherosclerosis 01/08/2021   Ascending aortic aneurysm (HCC) 01/08/2021   Bladder neck obstruction 10/20/2019   History of colonic polyps    Benign neoplasm of ascending colon    Benign neoplasm of transverse colon    Viral illness 06/16/2019   Exposure to COVID-19 virus 02/02/2019   Cerumen impaction 08/27/2018   Pilonidal cyst 01/18/2017   Neoplasm of uncertain behavior of skin 01/18/2017   Hypokalemia 12/11/2016   Tobacco use disorder 10/17/2016   Well adult exam 06/22/2016   Polycythemia, 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   Abscessed tooth 07/06/2015   Sebaceous cyst 07/06/2015   Common cold virus 12/23/2014   Infected sebaceous cyst  11/09/2014   Dyslipidemia 05/19/2013   URI, acute 09/15/2012   Strep throat 09/15/2012   Renal cell cancer (HCC) 09/02/2012   Adrenal mass (HCC) 06/03/2012   Nephrolithiasis 06/03/2012   Otitis media 01/08/2012   Dark urine 01/08/2012   Decreased vision in both eyes 01/08/2012   Onychomycosis 05/25/2011   DM type 2, controlled, with complication (HCC) 05/23/2010   Keloid scar 03/07/2010   SKIN RASH 03/07/2010   LACERATION, FINGER 09/15/2009   Osteoarthritis 08/15/2009   LOW BACK PAIN 08/15/2009   CELLULITIS AND ABSCESS OF BUTTOCK 03/29/2009   CERVICAL RADICULOPATHY 11/09/2008   WARTS, VIRAL, UNSPECIFIED 10/28/2007   Essential hypertension 06/30/2007   CLUSTER HEADACHE 06/27/2007   PSA, INCREASED 06/27/2007    ONSET DATE: 01/06/2024 (referral date)  REFERRING DIAG: G56.31 (ICD-10-CM) - Posterior interosseous mononeuropathy, right   Per 01/06/24 referral notes: Wrist Drop - Splinting   THERAPY DIAG:  Other disturbances of skin sensation  Other symptoms and  signs involving the nervous system  Other symptoms and signs involving the musculoskeletal system  Rationale for Evaluation and Treatment: Rehabilitation  SUBJECTIVE:   SUBJECTIVE STATEMENT: Pt reported no acute changes and no updates.   Pt accompanied by: self  PERTINENT HISTORY: Note: Pt previous seen for OT sessions at this clinic to address Neuralgic amyotrophy of right brachial plexus from 07/2023 to 09/2023.  Per 01/15/24 DO Office Visit: "68 y.o. ambidextrous male with diabetes mellitus, hyperlipidemia, hypertension, bladder cancer, CAD, right renal cancer, and GERD returning to the clinic for follow-up of right brachial plexopathy... 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  MRI brachial plexus was normal... He has noticed that he's able to spread the fingers out a little better, but still not able to extend his fingers. He continues to have spells of severe pain and  takes gabapentin  100mg , but it makes him sleepy. He is also undergoing evaluation for possible nerve transfer with Dr. Felipe Horton (neurosurgery)"    PRECAUTIONS: Pt reported no lifting over 10 lbs  WEIGHT BEARING RESTRICTIONS: No  PAIN:  Are you having pain? No pain, "just tingly"  FALLS: Has patient fallen in last 6 months? No  LIVING ENVIRONMENT: Lives with: lives with their family (spouse and 11 yo grandson) Lives in: House/apartment Stairs: Yes: External: ramp to back entrance, front entrance has 5-6 steps Has following equipment at home: Ramped entry, comfort height toilet  PLOF: Independent with basic ADLs   PATIENT GOALS: "to get my hand back working again," "to get my finger function working"  OBJECTIVE:  Note: Objective measures were completed at Evaluation unless otherwise noted.  HAND DOMINANCE: Ambidextrous  ADLs: Overall ADLs:  Transfers/ambulation related to ADLs: Eating: ind Grooming: ind UB Dressing: ind, no difficulty with buttons/zippers LB Dressing: ind Toileting: ind, sometimes difficulty with zipper on pants Bathing: ind Tub Shower transfers: ind Equipment:  tub shower  IADLs: Shopping: ind for carrying groceries Light housekeeping: ind laundry, vacuuming Meal Prep: ind - uses 2 hands to carry heavy pots/pans Community mobility: currently driving Medication management: ind, pt reported sometimes forgetting to take medications and acknowledged he may benefit from medication organizer  Handwriting:  Pt reported "always been terrible." Not a concern per pt. However, pt reported difficulty stabilizing paper with R hand.  MOBILITY STATUS: ind without A/E.   POSTURE COMMENTS:  Ind sitting balance, leaned heavily to R side while seated in chair though able to correct to upright seated posture with min v/c  ACTIVITY TOLERANCE: Activity tolerance: Pt reported no changes.   FUNCTIONAL OUTCOME MEASURES: Quick Dash: 15.9% deficit    09/23/23 Quick Dash:  11.36 impairment  UPPER EXTREMITY ROM:    Active ROM Right eval Left eval  Shoulder flexion [facility]  Shoulder abduction    Shoulder adduction    Shoulder extension    Shoulder internal rotation    Shoulder external rotation    Elbow flexion    Elbow extension    Wrist flexion    Wrist extension    Wrist ulnar deviation    Wrist radial deviation    Wrist pronation    Wrist supination    (Blank rows = not tested)  Pt reported pain in wrist when moving wrist.  RUE - digit ext impaired especially middle finger, ring finger, small finger. Pt demo'd ability to achieve full digit ext of middle finger, ring finger, and small finger with AAROM and PROM. RUE digit flex WFL. Decreased RUE thumb ext though pt  reported improved since before. Pt demo'd improved WFL ADD/ABD of digits on tabletop. Pt able to maintain wrist ext and wrist in neutral for at least 30 seconds against gravity.   Per previous 2024 OT POC: decreased wrist extension against gravity, decreased finger extension long, ring, and small finger   HAND FUNCTION: Grip strength: Right: 30, 31 (30.5 lbs average) lbs; Left: 76 lbs  Pt reported RUE strength felt about the same today compared to previous 2024 OT POC.  Per previous 2024 OT POC:  Eval: Grip strength: Right: 21 lbs; Left: 68 lbs  09/04/23 - R: 30# 09/23/23: R: 34#   COORDINATION: 01/29/24 - 9 Hole Peg test: Right: 38 sec; Left: 30 sec  01/29/24 - Box and Blocks: Right: 46 Left: 54  Pt reported feeling coordinated, not having problems with coordination. Pt reported no concerns with RUE coordination. Pt reported good ability to complete all preferred daily activities requiring FM coordination with RUE.  Per previous 2024 OT POC: Baseline: Right 38 blocks, Left 50 blocks 09/04/23 - Right - 37 blocks  SENSATION: Pt reported increased symptoms of tingling/shooting pain in R hand compared to 2024.  EDEMA: None noted  MUSCLE TONE: RUE: decreased R digit ext AROM  and LUE: Within functional limits  COGNITION: Overall cognitive status: Within functional limits for tasks assessed  PERCEPTION: Not tested  PRAXIS: Not tested  OBSERVATIONS: Pt ambulated ind without A/E.                                                                                                                             TREATMENT DATE:    Self-Care OT educated pt on splint options and showed examples online and in clinic. OT educated pt on splint wear schedule after pt receives splint. Pt acknowledged understanding and verbalized preference for custom forearm-based resting hand splint.  Pt expressed questions/concerns related to medication. OT educated pt on OT role and recommended to f/u with doctor about medication questions/concerns. Pt verbalized understanding.   TherAct Handwriting - OT educated pt on adaptive strategies for stabilizing paper with affected UE while writing with LUE using paper of various sizes - e.g. resting forearm and lateral side of R hand on paper. Pt returned demo. Pt benefited from education and v/c to slow down and take deep breaths during handwriting to improve legibility to prevent (per pt) "my hand gets ahead of my brain."   PATIENT EDUCATION: Education details: see today's tx above Person educated: Patient Education method: Explanation Education comprehension: verbalized understanding  HOME EXERCISE PROGRAM: 01/29/24 - Pt politely declined HEP. Pt reported he is not going to complete home exercise programs and instead moves hands during daily tasks and while watching TV.   Please note: Pt has HEP from previous 2024 OT POC.   GOALS: Goals reviewed with patient? Yes  SHORT TERM GOALS: Target date: 02/21/24  Pt will verbalize understanding of splint options, including fabricated and over-the-counter options. Baseline: new to outpt OT  Goal status: INITIAL  2.  Pt will ind don/doff preferred RUE splint. Baseline: new to outpt OT Goal  status: INITIAL  3.  Pt will return demo of adaptive strategies and A/E options to improve ind and efficiency for functional ADL/IADL tasks. Baseline: pt reported difficulty stabilizing paper with R hand when handwriting. Pt reported ind with most ADL/IADLs. 02/05/24 - Pt returned demo of adaptive strategies for handwriting to stabilize paper with RUE. Goal status: MET  4.  Pt will verbalize understanding of strategies to manage symptoms of RUE. Baseline: pt reported increased shooting pain and tingling sensation of RUE Goal status: INITIAL  ASSESSMENT:  CLINICAL IMPRESSION: Pt met 1 goal today. Pt tolerated tasks well and demo'd good understanding of education. OT to fabricate splint at next session.  Pt would benefit from brief skilled OT POC in the outpatient setting to improve understanding of strategies to improve efficiency with handwriting tasks, to promote understanding of RUE splint/brace options, and to improve understanding of adaptive strategies to manage RUE symptoms.   PERFORMANCE DEFICITS: in functional skills including ADLs, IADLs, coordination, dexterity, proprioception, sensation, ROM, strength, flexibility, Fine motor control, Gross motor control, mobility, body mechanics, endurance, and UE functional use, cognitive skills including energy/drive, and psychosocial skills including environmental adaptation.   IMPAIRMENTS: are limiting patient from ADLs, IADLs, rest and sleep, work, leisure, and social participation.   CO-MORBIDITIES: may have co-morbidities  that affects occupational performance. Patient will benefit from skilled OT to address above impairments and improve overall function.  MODIFICATION OR ASSISTANCE TO COMPLETE EVALUATION: Min-Moderate modification of tasks or assist with assess necessary to complete an evaluation.  OT OCCUPATIONAL PROFILE AND HISTORY: Detailed assessment: Review of records and additional review of physical, cognitive, psychosocial history  related to current functional performance.  CLINICAL DECISION MAKING: Moderate - several treatment options, min-mod task modification necessary  REHAB POTENTIAL: Good  EVALUATION COMPLEXITY: Moderate    PLAN:  OT FREQUENCY: 1-2x/week  OT DURATION: 2 weeks (likely anticipate 2 visits)  PLANNED INTERVENTIONS: 16109 OT Re-evaluation, 97535 self care/ADL training, 60454 therapeutic exercise, 97530 therapeutic activity, 97112 neuromuscular re-education, 97140 manual therapy, 97035 ultrasound, 97018 paraffin, 09811 fluidotherapy, 97760 Orthotic Initial, 97761 Prosthetic Initial, 97763 Orthotic/Prosthetic subsequent, passive range of motion, functional mobility training, energy conservation, patient/family education, and DME and/or AE instructions  RECOMMENDED OTHER SERVICES: N/A  CONSULTED AND AGREED WITH PLAN OF CARE: Patient  PLAN FOR NEXT SESSION:  Fabricate custom resting hand splint (update goals) Discuss strategies to manage symptoms of tingling/shooting pain  Schedule 1 additional visit for splint monitor/splint check  Oakley Bellman, OT 02/06/2024, 7:35 AM

## 2024-02-06 ENCOUNTER — Other Ambulatory Visit: Payer: Self-pay | Admitting: Interventional Radiology

## 2024-02-06 DIAGNOSIS — N401 Enlarged prostate with lower urinary tract symptoms: Secondary | ICD-10-CM

## 2024-02-09 ENCOUNTER — Other Ambulatory Visit: Payer: Self-pay | Admitting: Neurology

## 2024-02-11 ENCOUNTER — Ambulatory Visit: Admitting: Occupational Therapy

## 2024-02-11 DIAGNOSIS — R208 Other disturbances of skin sensation: Secondary | ICD-10-CM

## 2024-02-11 DIAGNOSIS — G5631 Lesion of radial nerve, right upper limb: Secondary | ICD-10-CM | POA: Diagnosis not present

## 2024-02-11 DIAGNOSIS — R29898 Other symptoms and signs involving the musculoskeletal system: Secondary | ICD-10-CM

## 2024-02-11 DIAGNOSIS — R29818 Other symptoms and signs involving the nervous system: Secondary | ICD-10-CM | POA: Diagnosis not present

## 2024-02-11 NOTE — Therapy (Signed)
 OUTPATIENT OCCUPATIONAL THERAPY NEURO TREATMENT  Patient Name: [name] [name] MRN: [id number] DOB:1956-09-28, 68 y.o., male Today's Date: 02/11/2024  PCP: Genia Kettering, MD  REFERRING PROVIDER: Carroll Clamp, MD   END OF SESSION:  OT End of Session - 02/11/24 1836     Visit Number 3    Number of Visits 6   including eval   Date for OT Re-Evaluation 03/25/24    Authorization Type [facility] Medicare    Authorization Time Period **Auth#: [id number] , approved 6 OT visits from 01/29/2024-03/25/2024.**    Progress Note Due on Visit 10    OT Start Time 1452    OT Stop Time 1542    OT Time Calculation (min) 50 min    Activity Tolerance Patient tolerated treatment well    Behavior During Therapy [facility] for tasks assessed/performed               Past Medical History:  Diagnosis Date   Benign localized prostatic hyperplasia with lower urinary tract symptoms (LUTS)    urologist--- dr Inga Manges   Bladder cancer [facility])    urologist---- dr Inga Manges---  s/p TURBT 11/2020; 03/ 2022   Coronary artery disease    mild   DDD (degenerative disc disease), cervical    From 1980s   Diabetes mellitus type II    followed by pcp--   (01-05-2021  per pt checks blood sugar three times per week,  fasting sugar --- 130--140_   Difficult intubation 07/28/2012   partial nephrectomy done @[facility]  07-28-2012  found to have anterior larynx , diffcult intubation, referred to anesthesia record in epic    Dyslipidemia    ED (erectile dysfunction)    Elevated PSA    GERD (gastroesophageal reflux disease)    History of adenomatous polyp of colon    History of kidney stones    History of prostatitis 2008   History of renal carcinoma urologist--- dr Inga Manges   07-28-2012 for renal neoplasm  s/p right partal nephrectomy    Hypertension    followed by pcp   Neuromuscular disorder (HCC)    Osteoarthritis    Back   Renal carcinoma, right [facility])    Past Surgical History:  Procedure Laterality Date   CATARACT  EXTRACTION     COLONOSCOPY N/A 03/03/2013   Procedure: COLONOSCOPY;  Surgeon: Tobin Forts, MD;  Location: [facility];  Service: Endoscopy;  Laterality: N/A;   COLONOSCOPY WITH PROPOFOL  N/A 09/29/2019   Procedure: COLONOSCOPY WITH PROPOFOL ;  Surgeon: Tobin Forts, MD;  Location: [facility];  Service: Endoscopy;  Laterality: N/A;   CYSTOSCOPY WITH BIOPSY N/A 12/09/2020   Procedure: CYSTOSCOPY WITH TURBT, BIOPSY AND FULGURATION;  Surgeon: Homero Luster, MD;  Location: [facility];  Service: Urology;  Laterality: N/A;  REQUESTING 45 MINS   EXTRACORPOREAL SHOCK WAVE LITHOTRIPSY  01-10-2015;  03-03-2012  @[facility]    IR ANGIOGRAM PELVIS SELECTIVE OR SUPRASELECTIVE  01/21/2024   IR ANGIOGRAM SELECTIVE EACH ADDITIONAL VESSEL  01/21/2024   IR ANGIOGRAM SELECTIVE EACH ADDITIONAL VESSEL  01/21/2024   IR ANGIOGRAM SELECTIVE EACH ADDITIONAL VESSEL  01/21/2024   IR EMBO TUMOR ORGAN ISCHEMIA INFARCT INC GUIDE ROADMAPPING  01/21/2024   IR RADIOLOGIST EVAL & MGMT  12/23/2023   IR US  GUIDE VASC ACCESS LEFT  01/21/2024   IR US  GUIDE VASC ACCESS LEFT  01/21/2024   IR US  GUIDE VASC ACCESS RIGHT  01/21/2024   POLYPECTOMY  09/29/2019   Procedure: POLYPECTOMY;  Surgeon: Tobin Forts, MD;  Location: [facility];  Service: Endoscopy;;   ROBOT ASSISTED LAPAROSCOPIC PARTIAL NEPHRECTOMY  07-28-2012   DR Rozanne Corners  @[facility]    TONSILLECTOMY  1965   TRANSURETHRAL RESECTION OF BLADDER TUMOR N/A 11/27/2023   Procedure: TRANSURETHRAL RESECTION OF BLADDER TUMOR (TURBT), BILATERAL RETROGRADE;  Surgeon: Homero Luster, MD;  Location: [facility];  Service: Urology;  Laterality: N/A;  60 MINUTE CASE   TRANSURETHRAL RESECTION OF BLADDER TUMOR WITH MITOMYCIN -C N/A 01/06/2021   Procedure: TRANSURETHRAL RESECTION OF BLADDER TUMOR WITH GEMCITIBINE IN PACU;  Surgeon: Homero Luster, MD;  Location: [facility];  Service: Urology;  Laterality: N/A;   TRANSURETHRAL RESECTION OF BLADDER TUMOR WITH MITOMYCIN -C N/A 09/01/2021   Procedure: TRANSURETHRAL RESECTION OF  BLADDER TUMOR WITH POST OPERATIVE INSTILLATION OF GEMCITABINE ;  Surgeon: Homero Luster, MD;  Location: [facility];  Service: Urology;  Laterality: N/A;   TRANSURETHRAL RESECTION OF BLADDER TUMOR WITH MITOMYCIN -C Bilateral 11/13/2022   Procedure: CYSTOSCOPY TRANSURETHRAL RESECTION OF BLADDER TUMOR BILATERAL RETROGRADES WITH INSTILL GEMCTABINE;  Surgeon: Homero Luster, MD;  Location: [facility];  Service: Urology;  Laterality: Bilateral;  1 HR FOR CASE   Patient Active Problem List   Diagnosis Date Noted   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/13/2024   History of renal carcinoma    Posterior interosseous mononeuropathy, 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   Exposure to TB 08/26/2023   Cervical radiculitis 04/30/2023   Diabetic neuropathy (HCC) 04/23/2023   Hiccups 02/05/2023   Upper abdominal pain 02/05/2023   Gastroesophageal reflux disease 02/05/2023   EKG abnormalities 02/05/2023   Scalp itch 01/23/2023   Tetrahydrocannabinol (THC) use disorder, moderate, dependence (HCC) 01/23/2023   Polyneuropathy due to type 2 diabetes mellitus (HCC) 10/23/2022   Bladder cancer (HCC) 10/23/2022   Memory problem 07/17/2022   Leg pain, anterior, right 12/26/2021   Gross hematuria 09/16/2021   Statin declined 08/06/2021   Acute abscess of face 08/03/2021   Atherosclerosis of aorta (HCC) 04/26/2021   Coronary atherosclerosis 01/08/2021   Ascending aortic aneurysm (HCC) 01/08/2021   Bladder neck obstruction 10/20/2019   History of colonic polyps    Benign neoplasm of ascending colon    Benign neoplasm of transverse colon    Viral illness 06/16/2019   Exposure to COVID-19 virus 02/02/2019   Cerumen impaction 08/27/2018   Pilonidal cyst 01/18/2017   Neoplasm of uncertain behavior of skin 01/18/2017   Hypokalemia 12/11/2016   Tobacco use disorder 10/17/2016   Well adult exam 06/22/2016   Polycythemia, 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   Abscessed tooth  07/06/2015   Sebaceous cyst 07/06/2015   Common cold virus 12/23/2014   Infected sebaceous cyst 11/09/2014   Dyslipidemia 05/19/2013   URI, acute 09/15/2012   Strep throat 09/15/2012   Renal cell cancer (HCC) 09/02/2012   Adrenal mass (HCC) 06/03/2012   Nephrolithiasis 06/03/2012   Otitis media 01/08/2012   Dark urine 01/08/2012   Decreased vision in both eyes 01/08/2012   Onychomycosis 05/25/2011   DM type 2, controlled, with complication (HCC) 05/23/2010   Keloid scar 03/07/2010   SKIN RASH 03/07/2010   LACERATION, FINGER 09/15/2009   Osteoarthritis 08/15/2009   LOW BACK PAIN 08/15/2009   CELLULITIS AND ABSCESS OF BUTTOCK 03/29/2009   CERVICAL RADICULOPATHY 11/09/2008   WARTS, VIRAL, UNSPECIFIED 10/28/2007   Essential hypertension 06/30/2007   CLUSTER HEADACHE 06/27/2007   PSA, INCREASED 06/27/2007    ONSET DATE: 01/06/2024 (referral date)  REFERRING DIAG: G56.31 (ICD-10-CM) - Posterior interosseous mononeuropathy, right   Per 01/06/24 referral notes: Wrist Drop -  Splinting   THERAPY DIAG:  Other disturbances of skin sensation  Other symptoms and signs involving the nervous system  Other symptoms and signs involving the musculoskeletal system  Rationale for Evaluation and Treatment: Rehabilitation  SUBJECTIVE:   SUBJECTIVE STATEMENT: Pt reports noticing more issues on L side, particularly when lifting something a little bit heavier - pain in forearm.  Pt accompanied by: self  PERTINENT HISTORY: Note: Pt previous seen for OT sessions at this clinic to address Neuralgic amyotrophy of right brachial plexus from 07/2023 to 09/2023.  Per 01/15/24 DO Office Visit: "68 y.o. ambidextrous male with diabetes mellitus, hyperlipidemia, hypertension, bladder cancer, CAD, right renal cancer, and GERD returning to the clinic for follow-up of right brachial plexopathy... 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  MRI brachial plexus was  normal... He has noticed that he's able to spread the fingers out a little better, but still not able to extend his fingers. He continues to have spells of severe pain and takes gabapentin  100mg , but it makes him sleepy. He is also undergoing evaluation for possible nerve transfer with Dr. Felipe Horton (neurosurgery)"    PRECAUTIONS: Pt reported no lifting over 10 lbs  WEIGHT BEARING RESTRICTIONS: No  PAIN:  Are you having pain? No pain, "just tingly"  FALLS: Has patient fallen in last 6 months? No  LIVING ENVIRONMENT: Lives with: lives with their family (spouse and 37 yo grandson) Lives in: House/apartment Stairs: Yes: External: ramp to back entrance, front entrance has 5-6 steps Has following equipment at home: Ramped entry, comfort height toilet  PLOF: Independent with basic ADLs   PATIENT GOALS: "to get my hand back working again," "to get my finger function working"  OBJECTIVE:  Note: Objective measures were completed at Evaluation unless otherwise noted.  HAND DOMINANCE: Ambidextrous  ADLs: Overall ADLs:  Transfers/ambulation related to ADLs: Eating: ind Grooming: ind UB Dressing: ind, no difficulty with buttons/zippers LB Dressing: ind Toileting: ind, sometimes difficulty with zipper on pants Bathing: ind Tub Shower transfers: ind Equipment:  tub shower  IADLs: Shopping: ind for carrying groceries Light housekeeping: ind laundry, vacuuming Meal Prep: ind - uses 2 hands to carry heavy pots/pans Community mobility: currently driving Medication management: ind, pt reported sometimes forgetting to take medications and acknowledged he may benefit from medication organizer  Handwriting:  Pt reported "always been terrible." Not a concern per pt. However, pt reported difficulty stabilizing paper with R hand.  MOBILITY STATUS: ind without A/E.   POSTURE COMMENTS:  Ind sitting balance, leaned heavily to R side while seated in chair though able to correct to upright seated  posture with min v/c  ACTIVITY TOLERANCE: Activity tolerance: Pt reported no changes.   FUNCTIONAL OUTCOME MEASURES: Quick Dash: 15.9% deficit    09/23/23 Quick Dash: 11.36 impairment  UPPER EXTREMITY ROM:    Active ROM Right eval Left eval  Shoulder flexion [facility]  Shoulder abduction    Shoulder adduction    Shoulder extension    Shoulder internal rotation    Shoulder external rotation    Elbow flexion    Elbow extension    Wrist flexion    Wrist extension    Wrist ulnar deviation    Wrist radial deviation    Wrist pronation    Wrist supination    (Blank rows = not tested)  Pt reported pain in wrist when moving wrist.  RUE - digit ext impaired especially middle finger, ring finger, small finger. Pt demo'd ability to  achieve full digit ext of middle finger, ring finger, and small finger with AAROM and PROM. RUE digit flex WFL. Decreased RUE thumb ext though pt reported improved since before. Pt demo'd improved WFL ADD/ABD of digits on tabletop. Pt able to maintain wrist ext and wrist in neutral for at least 30 seconds against gravity.   Per previous 2024 OT POC: decreased wrist extension against gravity, decreased finger extension long, ring, and small finger   HAND FUNCTION: Grip strength: Right: 30, 31 (30.5 lbs average) lbs; Left: 76 lbs  Pt reported RUE strength felt about the same today compared to previous 2024 OT POC.  Per previous 2024 OT POC:  Eval: Grip strength: Right: 21 lbs; Left: 68 lbs  09/04/23 - R: 30# 09/23/23: R: 34#   COORDINATION: 01/29/24 - 9 Hole Peg test: Right: 38 sec; Left: 30 sec  01/29/24 - Box and Blocks: Right: 46 Left: 54  Pt reported feeling coordinated, not having problems with coordination. Pt reported no concerns with RUE coordination. Pt reported good ability to complete all preferred daily activities requiring FM coordination with RUE.  Per previous 2024 OT POC: Baseline: Right 38 blocks, Left 50 blocks 09/04/23 - Right -  37 blocks  SENSATION: Pt reported increased symptoms of tingling/shooting pain in R hand compared to 2024.  EDEMA: None noted  MUSCLE TONE: RUE: decreased R digit ext AROM and LUE: Within functional limits  COGNITION: Overall cognitive status: Within functional limits for tasks assessed  PERCEPTION: Not tested  PRAXIS: Not tested  OBSERVATIONS: Pt ambulated ind without A/E.                                                                                                                             TREATMENT DATE:  02/11/24 Splint fabrication: applied stockinet over forearm. OT measured R forearm and hand in preparation for fabrication of resting hand splint for increased wrist and finger extension and pain relief.  OT fabricated R orthosis with use of Orfit Multifit NS in 3.66mm 1/8".  OT fitting orthosis to pt's forearm and hand with focus on slight wrist extension, MCP flexion ~90* and extension at PIP and DIP, and min support at thumb.  OT affixed strapping proximal and distal to wrist to allow for more secure fit and to maintain wrist in mild extension and across fingers at PIP to maintain extension. Self-care: OT educated pt on recommendations to gradually initiate wear and recommendations to assess fit and ensure no areas of irritation.  OT recommended assessment of skin after periodic wear and to note any redness, demarkation, or irritation and to report at next session.  Educated on increased wear as tolerated and to provide any feedback at next session to allow for modifications to resting hand orthosis as needed.  Provided with handout.   PATIENT EDUCATION: Education details: see today's tx above Person educated: Patient Education method: Explanation Education comprehension: verbalized understanding  HOME EXERCISE PROGRAM: 01/29/24 - Pt politely declined  HEP. Pt reported he is not going to complete home exercise programs and instead moves hands during daily tasks and while  watching TV.   Please note: Pt has HEP from previous 2024 OT POC.   GOALS: Goals reviewed with patient? Yes  SHORT TERM GOALS: Target date: 02/21/24  Pt will verbalize understanding of splint options, including fabricated and over-the-counter options. Baseline: new to outpt OT Goal status: INITIAL  2.  Pt will ind don/doff preferred RUE splint. Baseline: new to outpt OT Goal status: INITIAL  3.  Pt will return demo of adaptive strategies and A/E options to improve ind and efficiency for functional ADL/IADL tasks. Baseline: pt reported difficulty stabilizing paper with R hand when handwriting. Pt reported ind with most ADL/IADLs. 02/05/24 - Pt returned demo of adaptive strategies for handwriting to stabilize paper with RUE. Goal status: MET  4.  Pt will verbalize understanding of strategies to manage symptoms of RUE. Baseline: pt reported increased shooting pain and tingling sensation of RUE Goal status: INITIAL  LONG TERM GOALS: Target date: 03/25/24  Pt will verbalize understanding of splint rationale and wear and care of resting hand splint. Baseline: resting hand splint fabricated 02/11/24 Goal status: INITIAL  2.  Pt will ind don/doff RUE resting hand splint. Baseline: new to outpt OT Goal status: INITIAL    ASSESSMENT:  CLINICAL IMPRESSION: Pt will benefit from additional 1-2 visits to allow for splint fit and check as well as to make modifications as needed.  Pt approved per insurance provider through 03/25/24 therefore requesting through that amount of time PRN to address splint needs.  Pt demonstrating good understanding of splint wear, progressive wear schedule, and care of splint.   Pt would benefit from brief skilled OT POC in the outpatient setting to improve understanding of strategies to improve efficiency with handwriting tasks, to promote understanding of RUE splint/brace wear and care, and to improve understanding of adaptive strategies to manage RUE symptoms.    PERFORMANCE DEFICITS: in functional skills including ADLs, IADLs, coordination, dexterity, proprioception, sensation, ROM, strength, flexibility, Fine motor control, Gross motor control, mobility, body mechanics, endurance, and UE functional use, cognitive skills including energy/drive, and psychosocial skills including environmental adaptation.     PLAN:  OT FREQUENCY: 1x/week  OT DURATION: 6 weeks (likely anticipate 2-3 more visits from 02/11/24)  PLANNED INTERVENTIONS: 97168 OT Re-evaluation, 97535 self care/ADL training, 21308 therapeutic exercise, 97530 therapeutic activity, 97112 neuromuscular re-education, 97140 manual therapy, 97035 ultrasound, 97018 paraffin, 65784 fluidotherapy, 97760 Orthotic Initial, 97761 Prosthetic Initial, 97763 Orthotic/Prosthetic subsequent, passive range of motion, functional mobility training, energy conservation, patient/family education, and DME and/or AE instructions  RECOMMENDED OTHER SERVICES: N/A  CONSULTED AND AGREED WITH PLAN OF CARE: Patient  PLAN FOR NEXT SESSION:  Assess fit of splint and adjust as needed  Discuss strategies to manage symptoms of tingling/shooting pain  Schedule 1-2 additional visits for splint monitor/splint check  Anthonette Kinsman, OT 02/11/2024, 6:38 PM

## 2024-02-18 ENCOUNTER — Ambulatory Visit: Attending: Neurosurgery | Admitting: Occupational Therapy

## 2024-02-18 DIAGNOSIS — R29898 Other symptoms and signs involving the musculoskeletal system: Secondary | ICD-10-CM | POA: Insufficient documentation

## 2024-02-18 DIAGNOSIS — R208 Other disturbances of skin sensation: Secondary | ICD-10-CM | POA: Insufficient documentation

## 2024-02-18 DIAGNOSIS — G8193 Hemiplegia, unspecified affecting right nondominant side: Secondary | ICD-10-CM | POA: Insufficient documentation

## 2024-02-18 DIAGNOSIS — R29818 Other symptoms and signs involving the nervous system: Secondary | ICD-10-CM | POA: Insufficient documentation

## 2024-02-18 NOTE — Therapy (Signed)
 OUTPATIENT OCCUPATIONAL THERAPY NEURO TREATMENT  Patient Name: [name] [name] MRN: [id number] DOB:03/19/1956, 68 y.o., male Today's Date: 02/18/2024  PCP: Genia Kettering, MD  REFERRING PROVIDER: Carroll Clamp, MD   END OF SESSION:  OT End of Session - 02/18/24 1344     Visit Number 4    Number of Visits 6   including eval   Date for OT Re-Evaluation 03/25/24    Authorization Type [facility] Medicare    Authorization Time Period **Auth#: [id number] , approved 6 OT visits from 01/29/2024-03/25/2024.**    Progress Note Due on Visit 10    OT Start Time 1316    OT Stop Time 1335    OT Time Calculation (min) 19 min    Activity Tolerance Patient tolerated treatment well    Behavior During Therapy WFL for tasks assessed/performed                Past Medical History:  Diagnosis Date   Benign localized prostatic hyperplasia with lower urinary tract symptoms (LUTS)    urologist--- dr Inga Manges   Bladder cancer [facility])    urologist---- dr Inga Manges---  s/p TURBT 11/2020; 03/ 2022   Coronary artery disease    mild   DDD (degenerative disc disease), cervical    From 1980s   Diabetes mellitus type II    followed by pcp--   (01-05-2021  per pt checks blood sugar three times per week,  fasting sugar --- 130--140_   Difficult intubation 07/28/2012   partial nephrectomy done @[facility]  07-28-2012  found to have anterior larynx , diffcult intubation, referred to anesthesia record in epic    Dyslipidemia    ED (erectile dysfunction)    Elevated PSA    GERD (gastroesophageal reflux disease)    History of adenomatous polyp of colon    History of kidney stones    History of prostatitis 2008   History of renal carcinoma urologist--- dr Inga Manges   07-28-2012 for renal neoplasm  s/p right partal nephrectomy    Hypertension    followed by pcp   Neuromuscular disorder (HCC)    Osteoarthritis    Back   Renal carcinoma, right [facility])    Past Surgical History:  Procedure Laterality Date   CATARACT  EXTRACTION     COLONOSCOPY N/A 03/03/2013   Procedure: COLONOSCOPY;  Surgeon: Tobin Forts, MD;  Location: [facility];  Service: Endoscopy;  Laterality: N/A;   COLONOSCOPY WITH PROPOFOL  N/A 09/29/2019   Procedure: COLONOSCOPY WITH PROPOFOL ;  Surgeon: Tobin Forts, MD;  Location: [facility];  Service: Endoscopy;  Laterality: N/A;   CYSTOSCOPY WITH BIOPSY N/A 12/09/2020   Procedure: CYSTOSCOPY WITH TURBT, BIOPSY AND FULGURATION;  Surgeon: Homero Luster, MD;  Location: [facility];  Service: Urology;  Laterality: N/A;  REQUESTING 45 MINS   EXTRACORPOREAL SHOCK WAVE LITHOTRIPSY  01-10-2015;  03-03-2012  @[facility]    IR ANGIOGRAM PELVIS SELECTIVE OR SUPRASELECTIVE  01/21/2024   IR ANGIOGRAM SELECTIVE EACH ADDITIONAL VESSEL  01/21/2024   IR ANGIOGRAM SELECTIVE EACH ADDITIONAL VESSEL  01/21/2024   IR ANGIOGRAM SELECTIVE EACH ADDITIONAL VESSEL  01/21/2024   IR EMBO TUMOR ORGAN ISCHEMIA INFARCT INC GUIDE ROADMAPPING  01/21/2024   IR RADIOLOGIST EVAL & MGMT  12/23/2023   IR US  GUIDE VASC ACCESS LEFT  01/21/2024   IR US  GUIDE VASC ACCESS LEFT  01/21/2024   IR US  GUIDE VASC ACCESS RIGHT  01/21/2024   POLYPECTOMY  09/29/2019   Procedure: POLYPECTOMY;  Surgeon: Tobin Forts, MD;  Location: [facility];  Service: Endoscopy;;   ROBOT ASSISTED LAPAROSCOPIC PARTIAL NEPHRECTOMY  07-28-2012   DR Rozanne Corners  @[facility]    TONSILLECTOMY  1965   TRANSURETHRAL RESECTION OF BLADDER TUMOR N/A 11/27/2023   Procedure: TRANSURETHRAL RESECTION OF BLADDER TUMOR (TURBT), BILATERAL RETROGRADE;  Surgeon: Homero Luster, MD;  Location: [facility];  Service: Urology;  Laterality: N/A;  60 MINUTE CASE   TRANSURETHRAL RESECTION OF BLADDER TUMOR WITH MITOMYCIN -C N/A 01/06/2021   Procedure: TRANSURETHRAL RESECTION OF BLADDER TUMOR WITH GEMCITIBINE IN PACU;  Surgeon: Homero Luster, MD;  Location: [facility];  Service: Urology;  Laterality: N/A;   TRANSURETHRAL RESECTION OF BLADDER TUMOR WITH MITOMYCIN -C N/A 09/01/2021   Procedure: TRANSURETHRAL RESECTION OF  BLADDER TUMOR WITH POST OPERATIVE INSTILLATION OF GEMCITABINE ;  Surgeon: Homero Luster, MD;  Location: [facility];  Service: Urology;  Laterality: N/A;   TRANSURETHRAL RESECTION OF BLADDER TUMOR WITH MITOMYCIN -C Bilateral 11/13/2022   Procedure: CYSTOSCOPY TRANSURETHRAL RESECTION OF BLADDER TUMOR BILATERAL RETROGRADES WITH INSTILL GEMCTABINE;  Surgeon: Homero Luster, MD;  Location: [facility];  Service: Urology;  Laterality: Bilateral;  1 HR FOR CASE   Patient Active Problem List   Diagnosis Date Noted   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/13/2024   History of renal carcinoma    Posterior interosseous mononeuropathy, 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   Exposure to TB 08/26/2023   Cervical radiculitis 04/30/2023   Diabetic neuropathy (HCC) 04/23/2023   Hiccups 02/05/2023   Upper abdominal pain 02/05/2023   Gastroesophageal reflux disease 02/05/2023   EKG abnormalities 02/05/2023   Scalp itch 01/23/2023   Tetrahydrocannabinol (THC) use disorder, moderate, dependence (HCC) 01/23/2023   Polyneuropathy due to type 2 diabetes mellitus (HCC) 10/23/2022   Bladder cancer (HCC) 10/23/2022   Memory problem 07/17/2022   Leg pain, anterior, right 12/26/2021   Gross hematuria 09/16/2021   Statin declined 08/06/2021   Acute abscess of face 08/03/2021   Atherosclerosis of aorta (HCC) 04/26/2021   Coronary atherosclerosis 01/08/2021   Ascending aortic aneurysm (HCC) 01/08/2021   Bladder neck obstruction 10/20/2019   History of colonic polyps    Benign neoplasm of ascending colon    Benign neoplasm of transverse colon    Viral illness 06/16/2019   Exposure to COVID-19 virus 02/02/2019   Cerumen impaction 08/27/2018   Pilonidal cyst 01/18/2017   Neoplasm of uncertain behavior of skin 01/18/2017   Hypokalemia 12/11/2016   Tobacco use disorder 10/17/2016   Well adult exam 06/22/2016   Polycythemia, 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   Abscessed tooth  07/06/2015   Sebaceous cyst 07/06/2015   Common cold virus 12/23/2014   Infected sebaceous cyst 11/09/2014   Dyslipidemia 05/19/2013   URI, acute 09/15/2012   Strep throat 09/15/2012   Renal cell cancer (HCC) 09/02/2012   Adrenal mass (HCC) 06/03/2012   Nephrolithiasis 06/03/2012   Otitis media 01/08/2012   Dark urine 01/08/2012   Decreased vision in both eyes 01/08/2012   Onychomycosis 05/25/2011   DM type 2, controlled, with complication (HCC) 05/23/2010   Keloid scar 03/07/2010   SKIN RASH 03/07/2010   LACERATION, FINGER 09/15/2009   Osteoarthritis 08/15/2009   LOW BACK PAIN 08/15/2009   CELLULITIS AND ABSCESS OF BUTTOCK 03/29/2009   CERVICAL RADICULOPATHY 11/09/2008   WARTS, VIRAL, UNSPECIFIED 10/28/2007   Essential hypertension 06/30/2007   CLUSTER HEADACHE 06/27/2007   PSA, INCREASED 06/27/2007    ONSET DATE: 01/06/2024 (referral date)  REFERRING DIAG: G56.31 (ICD-10-CM) - Posterior interosseous mononeuropathy, right   Per 01/06/24 referral notes:  Wrist Drop - Splinting   THERAPY DIAG:  Other disturbances of skin sensation  Other symptoms and signs involving the nervous system  Other symptoms and signs involving the musculoskeletal system  Hemiplegia affecting right nondominant side, unspecified etiology, unspecified hemiplegia type (HCC)  Rationale for Evaluation and Treatment: Rehabilitation  SUBJECTIVE:   SUBJECTIVE STATEMENT: Pt reports tingling and electrical shock sensation are much better with the wear of the orthosis and "not as much" when not wearing the splint.  Pt accompanied by: self  PERTINENT HISTORY: Note: Pt previous seen for OT sessions at this clinic to address Neuralgic amyotrophy of right brachial plexus from 07/2023 to 09/2023.  Per 01/15/24 DO Office Visit: "68 y.o. ambidextrous male with diabetes mellitus, hyperlipidemia, hypertension, bladder cancer, CAD, right renal cancer, and GERD returning to the clinic for follow-up of right  brachial plexopathy... 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  MRI brachial plexus was normal... He has noticed that he's able to spread the fingers out a little better, but still not able to extend his fingers. He continues to have spells of severe pain and takes gabapentin  100mg , but it makes him sleepy. He is also undergoing evaluation for possible nerve transfer with Dr. Felipe Horton (neurosurgery)"    PRECAUTIONS: Pt reported no lifting over 10 lbs  WEIGHT BEARING RESTRICTIONS: No  PAIN:  Are you having pain? No pain, "just tingly"  FALLS: Has patient fallen in last 6 months? No  LIVING ENVIRONMENT: Lives with: lives with their family (spouse and 61 yo grandson) Lives in: House/apartment Stairs: Yes: External: ramp to back entrance, front entrance has 5-6 steps Has following equipment at home: Ramped entry, comfort height toilet  PLOF: Independent with basic ADLs   PATIENT GOALS: "to get my hand back working again," "to get my finger function working"  OBJECTIVE:  Note: Objective measures were completed at Evaluation unless otherwise noted.  HAND DOMINANCE: Ambidextrous  ADLs: Overall ADLs:  Transfers/ambulation related to ADLs: Eating: ind Grooming: ind UB Dressing: ind, no difficulty with buttons/zippers LB Dressing: ind Toileting: ind, sometimes difficulty with zipper on pants Bathing: ind Tub Shower transfers: ind Equipment:  tub shower  IADLs: Shopping: ind for carrying groceries Light housekeeping: ind laundry, vacuuming Meal Prep: ind - uses 2 hands to carry heavy pots/pans Community mobility: currently driving Medication management: ind, pt reported sometimes forgetting to take medications and acknowledged he may benefit from medication organizer  Handwriting:  Pt reported "always been terrible." Not a concern per pt. However, pt reported difficulty stabilizing paper with R hand.  MOBILITY STATUS: ind without A/E.    POSTURE COMMENTS:  Ind sitting balance, leaned heavily to R side while seated in chair though able to correct to upright seated posture with min v/c  ACTIVITY TOLERANCE: Activity tolerance: Pt reported no changes.   FUNCTIONAL OUTCOME MEASURES: Quick Dash: 15.9% deficit    09/23/23 Quick Dash: 11.36 impairment  UPPER EXTREMITY ROM:    Active ROM Right eval Left eval  Shoulder flexion [facility]  Shoulder abduction    Shoulder adduction    Shoulder extension    Shoulder internal rotation    Shoulder external rotation    Elbow flexion    Elbow extension    Wrist flexion    Wrist extension    Wrist ulnar deviation    Wrist radial deviation    Wrist pronation    Wrist supination    (Blank rows = not tested)  Pt reported pain in wrist  when moving wrist.  RUE - digit ext impaired especially middle finger, ring finger, small finger. Pt demo'd ability to achieve full digit ext of middle finger, ring finger, and small finger with AAROM and PROM. RUE digit flex WFL. Decreased RUE thumb ext though pt reported improved since before. Pt demo'd improved WFL ADD/ABD of digits on tabletop. Pt able to maintain wrist ext and wrist in neutral for at least 30 seconds against gravity.   Per previous 2024 OT POC: decreased wrist extension against gravity, decreased finger extension long, ring, and small finger   HAND FUNCTION: Grip strength: Right: 30, 31 (30.5 lbs average) lbs; Left: 76 lbs  Pt reported RUE strength felt about the same today compared to previous 2024 OT POC.  Per previous 2024 OT POC:  Eval: Grip strength: Right: 21 lbs; Left: 68 lbs  09/04/23 - R: 30# 09/23/23: R: 34#   COORDINATION: 01/29/24 - 9 Hole Peg test: Right: 38 sec; Left: 30 sec  01/29/24 - Box and Blocks: Right: 46 Left: 54  Pt reported feeling coordinated, not having problems with coordination. Pt reported no concerns with RUE coordination. Pt reported good ability to complete all preferred daily  activities requiring FM coordination with RUE.  Per previous 2024 OT POC: Baseline: Right 38 blocks, Left 50 blocks 09/04/23 - Right - 37 blocks  SENSATION: Pt reported increased symptoms of tingling/shooting pain in R hand compared to 2024.  EDEMA: None noted  MUSCLE TONE: RUE: decreased R digit ext AROM and LUE: Within functional limits  COGNITION: Overall cognitive status: Within functional limits for tasks assessed  PERCEPTION: Not tested  PRAXIS: Not tested  OBSERVATIONS: Pt ambulated ind without A/E.                                                                                                                             TREATMENT DATE:  02/18/24 Splint fit assessment: OT assessed integrity of skin and orthosis with no signs of redness or irritation.  Pt reports no concerns with fit and no irritation.  OT cut and supplied pt with additional stockinette for skin integrity between skin and splint and additional strapping to ensure proper fit. Self-care: pt demonstrating good understanding of donning/doffing splint and reporting understanding of wear time and skin integrity with removal of splint.  Pt reports decrease in tingling when wearing splint and even when not wearing splint.  OT reiterated recommendations for continued engagement in HEP from previous OT POC and functional use of RUE as able.    02/11/24 Splint fabrication: applied stockinet over forearm. OT measured R forearm and hand in preparation for fabrication of resting hand splint for increased wrist and finger extension and pain relief.  OT fabricated R orthosis with use of Orfit Multifit NS in 3.17mm 1/8".  OT fitting orthosis to pt's forearm and hand with focus on slight wrist extension, MCP flexion ~90* and extension at PIP and DIP, and min support at thumb.  OT  affixed strapping proximal and distal to wrist to allow for more secure fit and to maintain wrist in mild extension and across fingers at PIP to maintain  extension. Self-care: OT educated pt on recommendations to gradually initiate wear and recommendations to assess fit and ensure no areas of irritation.  OT recommended assessment of skin after periodic wear and to note any redness, demarkation, or irritation and to report at next session.  Educated on increased wear as tolerated and to provide any feedback at next session to allow for modifications to resting hand orthosis as needed.  Provided with handout.   PATIENT EDUCATION: Education details: see today's tx above Person educated: Patient Education method: Explanation Education comprehension: verbalized understanding  HOME EXERCISE PROGRAM: 01/29/24 - Pt politely declined HEP. Pt reported he is not going to complete home exercise programs and instead moves hands during daily tasks and while watching TV.   Please note: Pt has HEP from previous 2024 OT POC.   GOALS: Goals reviewed with patient? Yes  SHORT TERM GOALS: Target date: 02/21/24  Pt will verbalize understanding of splint options, including fabricated and over-the-counter options. Baseline: new to outpt OT Goal status: MET - 02/11/24  2.  Pt will ind don/doff preferred RUE splint. Baseline: new to outpt OT Goal status: INITIAL  3.  Pt will return demo of adaptive strategies and A/E options to improve ind and efficiency for functional ADL/IADL tasks. Baseline: pt reported difficulty stabilizing paper with R hand when handwriting. Pt reported ind with most ADL/IADLs. 02/05/24 - Pt returned demo of adaptive strategies for handwriting to stabilize paper with RUE. Goal status: MET  4.  Pt will verbalize understanding of strategies to manage symptoms of RUE. Baseline: pt reported increased shooting pain and tingling sensation of RUE Goal status: INITIAL  LONG TERM GOALS: Target date: 03/25/24  Pt will verbalize understanding of splint rationale and wear and care of resting hand splint. Baseline: resting hand splint fabricated  02/11/24 Goal status: INITIAL  2.  Pt will ind don/doff RUE resting hand splint. Baseline: new to outpt OT Goal status: MET - 02/18/24    ASSESSMENT:  CLINICAL IMPRESSION: Pt demonstrating good skin integrity and awareness of rationale of wear and care of splint.  Pt verbalizing decreased tingling and "electric shocks" in hand and forearm, nearly absent when wearing splint and decreased when splint removed.  Pt reports he is awaiting potential surgery and is pleased with current progress and wear of splint.  Pt will hold on therapy services ~30 days to allow for f/u with MD and if any issues arise with splint.  Pt reports understanding and agreement of current plan and to contact if any changes or issues with splint.  Will d/c after hold if no further issues arise.   PERFORMANCE DEFICITS: in functional skills including ADLs, IADLs, coordination, dexterity, proprioception, sensation, ROM, strength, flexibility, Fine motor control, Gross motor control, mobility, body mechanics, endurance, and UE functional use, cognitive skills including energy/drive, and psychosocial skills including environmental adaptation.     PLAN:  OT FREQUENCY: 1x/week  OT DURATION: 6 weeks (likely anticipate 2-3 more visits from 02/11/24)  PLANNED INTERVENTIONS: 97168 OT Re-evaluation, 97535 self care/ADL training, 19147 therapeutic exercise, 97530 therapeutic activity, 97112 neuromuscular re-education, 97140 manual therapy, 97035 ultrasound, 97018 paraffin, 82956 fluidotherapy, 97760 Orthotic Initial, 97761 Prosthetic Initial, 97763 Orthotic/Prosthetic subsequent, passive range of motion, functional mobility training, energy conservation, patient/family education, and DME and/or AE instructions  RECOMMENDED OTHER SERVICES: N/A  CONSULTED AND AGREED WITH PLAN  OF CARE: Patient  PLAN FOR NEXT SESSION:  Assess fit of splint and adjust as needed  Discuss strategies to manage symptoms of tingling/shooting  pain  Schedule 1-2 additional visits for splint monitor/splint check  Mica Ramdass, OT 02/18/2024, 1:45 PM

## 2024-03-02 ENCOUNTER — Ambulatory Visit: Payer: Medicare Other | Admitting: Internal Medicine

## 2024-03-16 ENCOUNTER — Encounter: Payer: Self-pay | Admitting: Neurosurgery

## 2024-03-16 NOTE — Telephone Encounter (Signed)
 Message sent upfront to schedule the patient.

## 2024-03-16 NOTE — Telephone Encounter (Signed)
Duplicate message.  See other encounter.

## 2024-03-17 ENCOUNTER — Other Ambulatory Visit (INDEPENDENT_AMBULATORY_CARE_PROVIDER_SITE_OTHER)

## 2024-03-17 DIAGNOSIS — E118 Type 2 diabetes mellitus with unspecified complications: Secondary | ICD-10-CM

## 2024-03-17 LAB — COMPREHENSIVE METABOLIC PANEL WITH GFR
ALT: 22 U/L (ref 0–53)
AST: 13 U/L (ref 0–37)
Albumin: 4.2 g/dL (ref 3.5–5.2)
Alkaline Phosphatase: 88 U/L (ref 39–117)
BUN: 10 mg/dL (ref 6–23)
CO2: 29 meq/L (ref 19–32)
Calcium: 9.3 mg/dL (ref 8.4–10.5)
Chloride: 104 meq/L (ref 96–112)
Creatinine, Ser: 0.8 mg/dL (ref 0.40–1.50)
GFR: 90.21 mL/min (ref >60.00)
Glucose, Bld: 238 mg/dL — ABNORMAL HIGH (ref 70–99)
Potassium: 3.5 meq/L (ref 3.5–5.1)
Sodium: 140 meq/L (ref 135–145)
Total Bilirubin: 0.5 mg/dL (ref 0.2–1.2)
Total Protein: 6.6 g/dL (ref 6.0–8.3)

## 2024-03-17 LAB — HEMOGLOBIN A1C: Hgb A1c MFr Bld: 9 % — ABNORMAL HIGH (ref 4.6–6.5)

## 2024-03-18 ENCOUNTER — Ambulatory Visit (INDEPENDENT_AMBULATORY_CARE_PROVIDER_SITE_OTHER): Admitting: Internal Medicine

## 2024-03-18 ENCOUNTER — Encounter: Payer: Self-pay | Admitting: Internal Medicine

## 2024-03-18 VITALS — BP 118/78 | HR 79 | Temp 98.3°F | Ht 66.0 in | Wt 180.0 lb

## 2024-03-18 DIAGNOSIS — Z7985 Long-term (current) use of injectable non-insulin antidiabetic drugs: Secondary | ICD-10-CM

## 2024-03-18 DIAGNOSIS — Z7984 Long term (current) use of oral hypoglycemic drugs: Secondary | ICD-10-CM | POA: Diagnosis not present

## 2024-03-18 DIAGNOSIS — E1142 Type 2 diabetes mellitus with diabetic polyneuropathy: Secondary | ICD-10-CM

## 2024-03-18 DIAGNOSIS — N3281 Overactive bladder: Secondary | ICD-10-CM | POA: Insufficient documentation

## 2024-03-18 DIAGNOSIS — C679 Malignant neoplasm of bladder, unspecified: Secondary | ICD-10-CM

## 2024-03-18 DIAGNOSIS — E118 Type 2 diabetes mellitus with unspecified complications: Secondary | ICD-10-CM | POA: Diagnosis not present

## 2024-03-18 MED ORDER — OZEMPIC (0.25 OR 0.5 MG/DOSE) 2 MG/3ML ~~LOC~~ SOPN: PEN_INJECTOR | SUBCUTANEOUS | 2 refills | Status: DC

## 2024-03-18 MED ORDER — TOLTERODINE TARTRATE 2 MG PO TABS: 2.0000 mg | ORAL_TABLET | Freq: Two times a day (BID) | ORAL | 5 refills | Status: DC

## 2024-03-18 MED ORDER — TADALAFIL 20 MG PO TABS: 20.0000 mg | ORAL_TABLET | ORAL | 5 refills | Status: AC | PRN

## 2024-03-18 NOTE — Assessment & Plan Note (Addendum)
 Worse Monitor A1c Amaryl   4 mg bid before (max dose) Metformin  bid 500 mg - pt stopped Rybelsus  - d/c. Start Ozempic

## 2024-03-18 NOTE — Assessment & Plan Note (Signed)
 H/o BCG treatments

## 2024-03-18 NOTE — Progress Notes (Signed)
 Subjective:  Patient ID: [name] [name], male    DOB: 12/22/1955  Age: 68 y.o. MRN: [id number]  CC: Medical Management of Chronic Issues (3 mnth f/u, discuss insulin  and why is body is not regulating insulin  )   HPI [name] [name] presents for DM, HTN, CRI C/o urgency, frequency - OAB  Outpatient Medications Prior to Visit  Medication Sig Dispense Refill   amLODipine  (NORVASC ) 5 MG tablet Take 1 tablet (5 mg total) by mouth daily. 90 tablet 3   Ascorbic Acid (VITAMIN C) 1000 MG tablet Take 1,000 mg by mouth once a week.     blood glucose meter kit and supplies KIT Use to test blood sugar once a day. DX: E11.9 1 each 0   Blood Glucose Monitoring Suppl (CONTOUR NEXT MONITOR) w/Device KIT 1 Device by Does not apply route daily. 1 kit 0   cholecalciferol (VITAMIN D ) 1000 UNITS tablet Take 1,000 Units by mouth daily.     clobetasol  (TEMOVATE ) 0.05 % external solution Apply 1 Application topically 2 (two) times daily as needed. Itchy scalp 50 mL 3   CONTOUR NEXT TEST test strip USE AS DIRECTED 100 strip 5   cyclobenzaprine  (FLEXERIL ) 5 MG tablet TAKE 1 TABLET BY MOUTH THREE TIMES A DAY AS NEEDED FOR MUSCLE SPASMS 90 tablet 1   doxycycline  (VIBRA -TABS) 100 MG tablet Take 1 tablet (100 mg total) by mouth 2 (two) times daily. 14 tablet 0   DULoxetine  (CYMBALTA ) 30 MG capsule TAKE 1 CAPSULE (30 MG TOTAL) BY MOUTH AT BEDTIME. 90 capsule 2   glimepiride  (AMARYL ) 4 MG tablet Take 1 tablet (4 mg total) by mouth in the morning and at bedtime. 180 tablet 3   HYDROcodone -acetaminophen  (NORCO) 10-325 MG tablet Take 1 tablet by mouth every 6 (six) hours as needed for severe pain (pain score 7-10). 40 tablet 0   ibuprofen  (ADVIL ) 800 MG tablet TAKE 1 TABLET (800 MG TOTAL) BY MOUTH DAILY AS NEEDED FOR MODERATE PAIN. 30 tablet 0   Lancets MISC Use to check blood sugar once per day. 100 each 3   mupirocin  ointment (BACTROBAN ) 2 % On leg wound w/dressing change qd or bid 30 g 0   olmesartan  (BENICAR ) 40 MG  tablet Take 1 tablet (40 mg total) by mouth daily. 90 tablet 3   potassium chloride  SA (KLOR-CON ) 20 MEQ tablet Take 1 tablet (20 mEq total) by mouth daily. (Patient taking differently: Take 20 mEq by mouth 2 (two) times a week.) 90 tablet 11   RYBELSUS  14 MG TABS TAKE 1 TABLET (14 MG TOTAL) BY MOUTH DAILY 30 tablet 5   tadalafil (CIALIS) 5 MG tablet Take 5 mg by mouth daily.     metFORMIN  (GLUCOPHAGE ) 500 MG tablet Take 1 tablet (500 mg total) by mouth 2 (two) times daily with a meal. (Patient not taking: Reported on 03/18/2024) 180 tablet 0   Facility-Administered Medications Prior to Visit  Medication Dose Route Frequency Provider Last Rate Last Admin   gemcitabine  (GEMZAR ) chemo syringe for bladder instillation 2,000 mg  2,000 mg Bladder Instillation Once Wrenn, John, MD        ROS: Review of Systems  Constitutional:  Negative for appetite change, fatigue and unexpected weight change.  HENT:  Negative for congestion, nosebleeds, sneezing, sore throat and trouble swallowing.   Eyes:  Negative for itching and visual disturbance.  Respiratory:  Negative for cough.   Cardiovascular:  Negative for chest pain, palpitations and leg swelling.  Gastrointestinal:  Negative for  abdominal distention, blood in stool, diarrhea and nausea.  Genitourinary:  Negative for frequency and hematuria.  Musculoskeletal:  Positive for arthralgias, back pain and neck stiffness. Negative for gait problem, joint swelling and neck pain.  Skin:  Negative for rash.  Neurological:  Negative for dizziness, tremors, speech difficulty and weakness.  Psychiatric/Behavioral:  Negative for agitation, dysphoric mood, sleep disturbance and suicidal ideas. The patient is not nervous/anxious.     Objective:  BP 118/78   Pulse 79   Temp 98.3 F (36.8 C) (Oral)   Ht 5\' 6"  (1.676 m)   Wt 180 lb (81.6 kg)   SpO2 (!) 79%   BMI 29.05 kg/m   BP Readings from Last 3 Encounters:  03/18/24 118/78  01/21/24 (!) 145/82   01/15/24 (!) 187/98    Wt Readings from Last 3 Encounters:  03/18/24 180 lb (81.6 kg)  01/21/24 174 lb (78.9 kg)  01/15/24 182 lb (82.6 kg)    Physical Exam Constitutional:      General: He is not in acute distress.    Appearance: Normal appearance. He is well-developed.     Comments: NAD  Eyes:     Conjunctiva/sclera: Conjunctivae normal.     Pupils: Pupils are equal, round, and reactive to light.  Neck:     Thyroid : No thyromegaly.     Vascular: No JVD.  Cardiovascular:     Rate and Rhythm: Normal rate and regular rhythm.     Heart sounds: Normal heart sounds. No murmur heard.    No friction rub. No gallop.  Pulmonary:     Effort: Pulmonary effort is normal. No respiratory distress.     Breath sounds: Normal breath sounds. No wheezing or rales.  Chest:     Chest wall: No tenderness.  Abdominal:     General: Bowel sounds are normal. There is no distension.     Palpations: Abdomen is soft. There is no mass.     Tenderness: There is no abdominal tenderness. There is no guarding or rebound.  Musculoskeletal:        General: No tenderness. Normal range of motion.     Cervical back: Normal range of motion.     Right lower leg: No edema.     Left lower leg: No edema.  Lymphadenopathy:     Cervical: No cervical adenopathy.  Skin:    General: Skin is warm and dry.     Findings: No rash.  Neurological:     Mental Status: He is alert and oriented to person, place, and time.     Cranial Nerves: No cranial nerve deficit.     Motor: No abnormal muscle tone.     Coordination: Coordination normal.     Gait: Gait normal.     Deep Tendon Reflexes: Reflexes are normal and symmetric.  Psychiatric:        Behavior: Behavior normal.        Thought Content: Thought content normal.        Judgment: Judgment normal.     Lab Results  Component Value Date   WBC 7.9 01/21/2024   HGB 16.6 01/21/2024   HCT 48.0 01/21/2024   PLT 166 01/21/2024   GLUCOSE 238 (H) 03/17/2024   CHOL  153 03/06/2021   TRIG 170.0 (H) 03/06/2021   HDL 33.50 (L) 03/06/2021   LDLCALC 86 03/06/2021   ALT 22 03/17/2024   AST 13 03/17/2024   NA 140 03/17/2024   K 3.5 03/17/2024   CL 104 03/17/2024  CREATININE 0.80 03/17/2024   BUN 10 03/17/2024   CO2 29 03/17/2024   TSH 0.64 03/06/2021   PSA 3.87 03/06/2021   INR 1.1 01/21/2024   HGBA1C 9.0 (H) 03/17/2024   MICROALBUR 1.3 03/06/2021    IR EMBO TUMOR ORGAN ISCHEMIA INFARCT INC GUIDE ROADMAPPING Result Date: 01/21/2024 INDICATION: 68 year old male with a history of treated bladder cancer and benign prostatic hyperplasia (~207 g) with moderate lower urinary tract symptoms (IPSS-QoL 16-3). EXAM: 1. Ultrasound-guided vascular access of the left radial artery. 2. Selective catheterization and angiography of the left internal iliac, anterior division left internal iliac, left vesicular artery, left internal pudendal artery, and left prostatic artery. 3. Cone beam CT. 4. Selective catheterization and angiography of the right internal iliac artery and right prostatic artery. 5. Prostate artery embolization. MEDICATIONS: Ciprofloxacin  400 mg IV. The antibiotic was administered within 1 hour of the procedure. 400 mcg nitroglycerin , intra arterial. ANESTHESIA/SEDATION: Moderate (conscious) sedation was employed during this procedure. A total of Versed  4 mg and Fentanyl  200 mcg was administered intravenously. Moderate Sedation Time: 138 minutes. The patient's level of consciousness and vital signs were monitored continuously by radiology nursing throughout the procedure under my direct supervision. CONTRAST:  41mL VISIPAQUE  IODIXANOL  320 MG/ML IV SOLN, 41mL VISIPAQUE  IODIXANOL  320 MG/ML IV SOLN, 41mL VISIPAQUE  IODIXANOL  320 MG/ML IV SOLN FLUOROSCOPY: Radiation Exposure Index (as provided by the fluoroscopic device): 2,626 mGy Kerma COMPLICATIONS: None immediate. PROCEDURE: Informed consent was obtained from the patient following explanation of the procedure,  risks, benefits and alternatives. The patient understands, agrees and consents for the procedure. All questions were addressed. A time out was performed prior to the initiation of the procedure. Maximal barrier sterile technique utilized including caps, mask, sterile gowns, sterile gloves, large sterile drape, hand hygiene, and Betadine prep. The left wrist was prepped and draped in standard fashion. Pulse oximeter was attached to the left thumb. Art Largo test was performed, grade A. The left radial artery measured 0.4 cm in diameter. Subdermal Local anesthesia was provided at the planned needle entry site with 1% lidocaine . A small skin nick was made. Under direct ultrasound visualization, the left radial artery was punctured with a 21 gauge micropuncture needle. A permanent image was captured and stored in the record. A microwire was placed and exchanged for a 4/5 French slender sheath. The sheath was flushed followed by installation of standard radial cocktail. A 5 Fr MG2 catheter Bentson wire were then directed to the level of the aortic arch. There was difficulty selecting the descending thoracic aorta. Therefore, the catheter was exchanged for a pigtail catheter which along with the Alline Ivans and [facility] wires, was unable to select the descending thoracic aorta. Therefore, the MG2 catheter was exchanged for an MG1 catheter which along with a Glidewire was able to select the descending thoracic aorta. The wire was exchanged for a Bentson wire and the catheter was exchanged for the MG2 catheter which was directed to the level of the left internal iliac artery. Internal iliac angiogram was performed which demonstrated patency of the anterior and posterior divisions. Correlating with preoperative CT imaging, the prostatic artery appeared to arise from the proximal internal pudendal artery. Using a 1.9 [place] triple angle lambda Progreat microcatheter and Aristotle 14 standard microwire, the presumed prostatic artery  was selected with moderate difficulty. Dedicated angiogram was performed which demonstrated no evidence of definite prostatic arterial supply. Cone beam CT was performed at this location which demonstrated catheter location within the vesicular artery. There is no evidence of  significant prostatic parenchymal opacification. The catheter was retracted to the level of the internal pudendal artery which was selected. Angiogram was performed which demonstrated a branch arising from the proximal to mid internal pudendal artery which possibly represent the prostatic artery. The artery was selected and angiogram was performed confirming prostatic parenchymal opacification. Cone beam CT was performed which confirmed left hemi prostatic supply without evidence of nontarget branch vessels. 100 mcg nitroglycerin  was administered at this location followed by the administration of a dilute solution of 400 micron Hydropearls in until hemostasis was achieved. The catheter was retracted and completion angiogram of the left prostatic artery demonstrated complete embolization of the left hemi prostate. The microcatheter was removed. The base catheter was then retracted to the level of the aortic bifurcation and advanced into the right internal iliac artery. Dedicated angiogram demonstrated patency of the anterior and posterior divisions with the prostatic artery appearing to arise from the proximal internal pudendal artery. 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 A Glidewire was then used to advance the MG2 catheter to the level of the internal pudendal ostium. With moderate difficulty, the 1.9 [place] triple angle Progreat lambda microcatheter and Aristotle 14 microwire were then used to select the right prostatic artery. Right prostatic angiogram was performed which demonstrated opacification of the right hemi prostate without evidence of none target branch vessels. Cone beam CT was  performed at this location which confirmed opacification of the right hemi prostate. There is no evidence of nontarget branch vessels. One hundred mcg nitroglycerin  was administered to this location followed by the administration of a dilute solution of 400 micron hydro pearls until hemostasis was achieved. Catheter was retracted slightly and completion right prostatic angiogram demonstrated complete embolization of the right hemi prostate. The catheters were removed. A TR band was applied to the left wrist and inflated. The sheath was removed. The patient tolerated the procedure well was transferred to the recovery area in good condition. IMPRESSION: Technically successful bilateral prostatic artery embolization. Creasie Doctor, MD [facility] Radiology Electronically Signed   By: Creasie Doctor M.D.   On: 01/21/2024 21:41   IR Angiogram Pelvis Selective Or Supraselective Result Date: 01/21/2024 INDICATION: 68 year old male with a history of treated bladder cancer and benign prostatic hyperplasia (~207 g) with moderate lower urinary tract symptoms (IPSS-QoL 16-3). EXAM: 1. Ultrasound-guided vascular access of the left radial artery. 2. Selective catheterization and angiography of the left internal iliac, anterior division left internal iliac, left vesicular artery, left internal pudendal artery, and left prostatic artery. 3. Cone beam CT. 4. Selective catheterization and angiography of the right internal iliac artery and right prostatic artery. 5. Prostate artery embolization. MEDICATIONS: Ciprofloxacin  400 mg IV. The antibiotic was administered within 1 hour of the procedure. 400 mcg nitroglycerin , intra arterial. ANESTHESIA/SEDATION: Moderate (conscious) sedation was employed during this procedure. A total of Versed  4 mg and Fentanyl  200 mcg was administered intravenously. Moderate Sedation Time: 138 minutes. The patient's level of consciousness and vital signs were  monitored continuously by radiology nursing throughout the procedure under my direct supervision. CONTRAST:  41mL VISIPAQUE  IODIXANOL  320 MG/ML IV SOLN, 41mL VISIPAQUE  IODIXANOL  320 MG/ML IV SOLN, 41mL VISIPAQUE  IODIXANOL  320 MG/ML IV SOLN FLUOROSCOPY: Radiation Exposure Index (as provided by the fluoroscopic device): 2,626 mGy Kerma COMPLICATIONS: None immediate. PROCEDURE: Informed consent was obtained from the patient following explanation of the procedure, risks, benefits and alternatives. The patient understands, agrees and consents for the procedure.  All questions were addressed. A time out was performed prior to the initiation of the procedure. Maximal barrier sterile technique utilized including caps, mask, sterile gowns, sterile gloves, large sterile drape, hand hygiene, and Betadine prep. The left wrist was prepped and draped in standard fashion. Pulse oximeter was attached to the left thumb. Art Largo test was performed, grade A. The left radial artery measured 0.4 cm in diameter. Subdermal Local anesthesia was provided at the planned needle entry site with 1% lidocaine . A small skin nick was made. Under direct ultrasound visualization, the left radial artery was punctured with a 21 gauge micropuncture needle. A permanent image was captured and stored in the record. A microwire was placed and exchanged for a 4/5 French slender sheath. The sheath was flushed followed by installation of standard radial cocktail. A 5 Fr MG2 catheter Bentson wire were then directed to the level of the aortic arch. There was difficulty selecting the descending thoracic aorta. Therefore, the catheter was exchanged for a pigtail catheter which along with the Alline Ivans and [facility] wires, was unable to select the descending thoracic aorta. Therefore, the MG2 catheter was exchanged for an MG1 catheter which along with a Glidewire was able to select the descending thoracic aorta. The wire was exchanged for a Bentson wire and the catheter  was exchanged for the MG2 catheter which was directed to the level of the left internal iliac artery. Internal iliac angiogram was performed which demonstrated patency of the anterior and posterior divisions. Correlating with preoperative CT imaging, the prostatic artery appeared to arise from the proximal internal pudendal artery. Using a 1.9 [place] triple angle lambda Progreat microcatheter and Aristotle 14 standard microwire, the presumed prostatic artery was selected with moderate difficulty. Dedicated angiogram was performed which demonstrated no evidence of definite prostatic arterial supply. Cone beam CT was performed at this location which demonstrated catheter location within the vesicular artery. There is no evidence of significant prostatic parenchymal opacification. The catheter was retracted to the level of the internal pudendal artery which was selected. Angiogram was performed which demonstrated a branch arising from the proximal to mid internal pudendal artery which possibly represent the prostatic artery. The artery was selected and angiogram was performed confirming prostatic parenchymal opacification. Cone beam CT was performed which confirmed left hemi prostatic supply without evidence of nontarget branch vessels. 100 mcg nitroglycerin  was administered at this location followed by the administration of a dilute solution of 400 micron Hydropearls in until hemostasis was achieved. The catheter was retracted and completion angiogram of the left prostatic artery demonstrated complete embolization of the left hemi prostate. The microcatheter was removed. The base catheter was then retracted to the level of the aortic bifurcation and advanced into the right internal iliac artery. Dedicated angiogram demonstrated patency of the anterior and posterior divisions with the prostatic artery appearing to arise from the proximal internal pudendal artery. There was moderate to severe stenosis about the origin  of the internal pudendal artery. A Glidewire was then used to advance the MG2 catheter to the level of the internal pudendal ostium. With moderate difficulty, the 1.9 [place] triple angle Progreat lambda microcatheter and Aristotle 14 microwire were then used to select the right prostatic artery. Right prostatic angiogram was performed which demonstrated opacification of the right hemi prostate without evidence of none target branch vessels. Cone beam CT was performed at this location which confirmed opacification of the right hemi prostate. There is no evidence of nontarget branch vessels. One hundred mcg nitroglycerin  was administered to  this location followed by the administration of a dilute solution of 400 micron hydro pearls until hemostasis was achieved. Catheter was retracted slightly and completion right prostatic angiogram demonstrated complete embolization of the right hemi prostate. The catheters were removed. A TR band was applied to the left wrist and inflated. The sheath was removed. The patient tolerated the procedure well was transferred to the recovery area in good condition. IMPRESSION: Technically successful bilateral prostatic artery embolization. Creasie Doctor, MD [facility] Radiology Electronically Signed   By: Creasie Doctor M.D.   On: 01/21/2024 21:41   IR Angiogram Selective Each Additional Vessel Result Date: 01/21/2024 INDICATION: 68 year old male with a history of treated bladder cancer and benign prostatic hyperplasia (~207 g) with moderate lower urinary tract symptoms (IPSS-QoL 16-3). EXAM: 1. Ultrasound-guided vascular access of the left radial artery. 2. Selective catheterization and angiography of the left internal iliac, anterior division left internal iliac, left vesicular artery, left internal pudendal artery, and left prostatic artery. 3. Cone beam CT. 4. Selective catheterization and angiography of the right internal iliac artery  and right prostatic artery. 5. Prostate artery embolization. MEDICATIONS: Ciprofloxacin  400 mg IV. The antibiotic was administered within 1 hour of the procedure. 400 mcg nitroglycerin , intra arterial. ANESTHESIA/SEDATION: Moderate (conscious) sedation was employed during this procedure. A total of Versed  4 mg and Fentanyl  200 mcg was administered intravenously. Moderate Sedation Time: 138 minutes. The patient's level of consciousness and vital signs were monitored continuously by radiology nursing throughout the procedure under my direct supervision. CONTRAST:  41mL VISIPAQUE  IODIXANOL  320 MG/ML IV SOLN, 41mL VISIPAQUE  IODIXANOL  320 MG/ML IV SOLN, 41mL VISIPAQUE  IODIXANOL  320 MG/ML IV SOLN FLUOROSCOPY: Radiation Exposure Index (as provided by the fluoroscopic device): 2,626 mGy Kerma COMPLICATIONS: None immediate. PROCEDURE: Informed consent was obtained from the patient following explanation of the procedure, risks, benefits and alternatives. The patient understands, agrees and consents for the procedure. All questions were addressed. A time out was performed prior to the initiation of the procedure. Maximal barrier sterile technique utilized including caps, mask, sterile gowns, sterile gloves, large sterile drape, hand hygiene, and Betadine prep. The left wrist was prepped and draped in standard fashion. Pulse oximeter was attached to the left thumb. Art Largo test was performed, grade A. The left radial artery measured 0.4 cm in diameter. Subdermal Local anesthesia was provided at the planned needle entry site with 1% lidocaine . A small skin nick was made. Under direct ultrasound visualization, the left radial artery was punctured with a 21 gauge micropuncture needle. A permanent image was captured and stored in the record. A microwire was placed and exchanged for a 4/5 French slender sheath. The sheath was flushed followed by installation of standard radial cocktail. A 5 Fr MG2 catheter Bentson wire were then  directed to the level of the aortic arch. There was difficulty selecting the descending thoracic aorta. Therefore, the catheter was exchanged for a pigtail catheter which along with the Alline Ivans and [facility] wires, was unable to select the descending thoracic aorta. Therefore, the MG2 catheter was exchanged for an MG1 catheter which along with a Glidewire was able to select the descending thoracic aorta. The wire was exchanged for a Bentson wire and the catheter was exchanged for the MG2 catheter which was directed to the level of the left internal iliac artery. Internal iliac angiogram was performed which demonstrated patency of the anterior and posterior divisions. Correlating with preoperative CT imaging, the prostatic artery appeared to arise from the proximal internal pudendal  artery. Using a 1.9 [place] triple angle lambda Progreat microcatheter and Aristotle 14 standard microwire, the presumed prostatic artery was selected with moderate difficulty. Dedicated angiogram was performed which demonstrated no evidence of definite prostatic arterial supply. Cone beam CT was performed at this location which demonstrated catheter location within the vesicular artery. There is no evidence of significant prostatic parenchymal opacification. The catheter was retracted to the level of the internal pudendal artery which was selected. Angiogram was performed which demonstrated a branch arising from the proximal to mid internal pudendal artery which possibly represent the prostatic artery. The artery was selected and angiogram was performed confirming prostatic parenchymal opacification. Cone beam CT was performed which confirmed left hemi prostatic supply without evidence of nontarget branch vessels. 100 mcg nitroglycerin  was administered at this location followed by the administration of a dilute solution of 400 micron Hydropearls in until hemostasis was achieved. The catheter was retracted and completion angiogram of the left  prostatic artery demonstrated complete embolization of the left hemi prostate. The microcatheter was removed. The base catheter was then retracted to the level of the aortic bifurcation and advanced into the right internal iliac artery. Dedicated angiogram demonstrated patency of the anterior and posterior divisions with the prostatic artery appearing to arise from the proximal internal pudendal artery. 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 A Glidewire was then used to advance the MG2 catheter to the level of the internal pudendal ostium. With moderate difficulty, the 1.9 [place] triple angle Progreat lambda microcatheter and Aristotle 14 microwire were then used to select the right prostatic artery. Right prostatic angiogram was performed which demonstrated opacification of the right hemi prostate without evidence of none target branch vessels. Cone beam CT was performed at this location which confirmed opacification of the right hemi prostate. There is no evidence of nontarget branch vessels. One hundred mcg nitroglycerin  was administered to this location followed by the administration of a dilute solution of 400 micron hydro pearls until hemostasis was achieved. Catheter was retracted slightly and completion right prostatic angiogram demonstrated complete embolization of the right hemi prostate. The catheters were removed. A TR band was applied to the left wrist and inflated. The sheath was removed. The patient tolerated the procedure well was transferred to the recovery area in good condition. IMPRESSION: Technically successful bilateral prostatic artery embolization. Creasie Doctor, MD [facility] Radiology Electronically Signed   By: Creasie Doctor M.D.   On: 01/21/2024 21:41   IR Angiogram Selective Each Additional Vessel Result Date: 01/21/2024 INDICATION: 68 year old male with a history of treated bladder cancer  and benign prostatic hyperplasia (~207 g) with moderate lower urinary tract symptoms (IPSS-QoL 16-3). EXAM: 1. Ultrasound-guided vascular access of the left radial artery. 2. Selective catheterization and angiography of the left internal iliac, anterior division left internal iliac, left vesicular artery, left internal pudendal artery, and left prostatic artery. 3. Cone beam CT. 4. Selective catheterization and angiography of the right internal iliac artery and right prostatic artery. 5. Prostate artery embolization. MEDICATIONS: Ciprofloxacin  400 mg IV. The antibiotic was administered within 1 hour of the procedure. 400 mcg nitroglycerin , intra arterial. ANESTHESIA/SEDATION: Moderate (conscious) sedation was employed during this procedure. A total of Versed  4 mg and Fentanyl  200 mcg was administered intravenously. Moderate Sedation Time: 138 minutes. The patient's level of consciousness and vital signs were monitored continuously by radiology nursing throughout the procedure under my direct supervision. CONTRAST:  41mL VISIPAQUE  IODIXANOL  320 MG/ML  IV SOLN, 41mL VISIPAQUE  IODIXANOL  320 MG/ML IV SOLN, 41mL VISIPAQUE  IODIXANOL  320 MG/ML IV SOLN FLUOROSCOPY: Radiation Exposure Index (as provided by the fluoroscopic device): 2,626 mGy Kerma COMPLICATIONS: None immediate. PROCEDURE: Informed consent was obtained from the patient following explanation of the procedure, risks, benefits and alternatives. The patient understands, agrees and consents for the procedure. All questions were addressed. A time out was performed prior to the initiation of the procedure. Maximal barrier sterile technique utilized including caps, mask, sterile gowns, sterile gloves, large sterile drape, hand hygiene, and Betadine prep. The left wrist was prepped and draped in standard fashion. Pulse oximeter was attached to the left thumb. Art Largo test was performed, grade A. The left radial artery measured 0.4 cm in diameter. Subdermal Local  anesthesia was provided at the planned needle entry site with 1% lidocaine . A small skin nick was made. Under direct ultrasound visualization, the left radial artery was punctured with a 21 gauge micropuncture needle. A permanent image was captured and stored in the record. A microwire was placed and exchanged for a 4/5 French slender sheath. The sheath was flushed followed by installation of standard radial cocktail. A 5 Fr MG2 catheter Bentson wire were then directed to the level of the aortic arch. There was difficulty selecting the descending thoracic aorta. Therefore, the catheter was exchanged for a pigtail catheter which along with the Alline Ivans and [facility] wires, was unable to select the descending thoracic aorta. Therefore, the MG2 catheter was exchanged for an MG1 catheter which along with a Glidewire was able to select the descending thoracic aorta. The wire was exchanged for a Bentson wire and the catheter was exchanged for the MG2 catheter which was directed to the level of the left internal iliac artery. Internal iliac angiogram was performed which demonstrated patency of the anterior and posterior divisions. Correlating with preoperative CT imaging, the prostatic artery appeared to arise from the proximal internal pudendal artery. Using a 1.9 [place] triple angle lambda Progreat microcatheter and Aristotle 14 standard microwire, the presumed prostatic artery was selected with moderate difficulty. Dedicated angiogram was performed which demonstrated no evidence of definite prostatic arterial supply. Cone beam CT was performed at this location which demonstrated catheter location within the vesicular artery. There is no evidence of significant prostatic parenchymal opacification. The catheter was retracted to the level of the internal pudendal artery which was selected. Angiogram was performed which demonstrated a branch arising from the proximal to mid internal pudendal artery which possibly represent the  prostatic artery. The artery was selected and angiogram was performed confirming prostatic parenchymal opacification. Cone beam CT was performed which confirmed left hemi prostatic supply without evidence of nontarget branch vessels. 100 mcg nitroglycerin  was administered at this location followed by the administration of a dilute solution of 400 micron Hydropearls in until hemostasis was achieved. The catheter was retracted and completion angiogram of the left prostatic artery demonstrated complete embolization of the left hemi prostate. The microcatheter was removed. The base catheter was then retracted to the level of the aortic bifurcation and advanced into the right internal iliac artery. Dedicated angiogram demonstrated patency of the anterior and posterior divisions with the prostatic artery appearing to arise from the proximal internal pudendal artery. 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 A Glidewire was then used to advance the MG2 catheter to the level of the internal pudendal ostium. With moderate difficulty, the 1.9 [place] triple angle Progreat lambda microcatheter and Aristotle 14 microwire were then  used to select the right prostatic artery. Right prostatic angiogram was performed which demonstrated opacification of the right hemi prostate without evidence of none target branch vessels. Cone beam CT was performed at this location which confirmed opacification of the right hemi prostate. There is no evidence of nontarget branch vessels. One hundred mcg nitroglycerin  was administered to this location followed by the administration of a dilute solution of 400 micron hydro pearls until hemostasis was achieved. Catheter was retracted slightly and completion right prostatic angiogram demonstrated complete embolization of the right hemi prostate. The catheters were removed. A TR band was applied to the left wrist and inflated. The sheath was removed. The  patient tolerated the procedure well was transferred to the recovery area in good condition. IMPRESSION: Technically successful bilateral prostatic artery embolization. Creasie Doctor, MD [facility] Radiology Electronically Signed   By: Creasie Doctor M.D.   On: 01/21/2024 21:41   IR US  Guide Vasc Access Right Result Date: 01/21/2024 INDICATION: 68 year old male with a history of treated bladder cancer and benign prostatic hyperplasia (~207 g) with moderate lower urinary tract symptoms (IPSS-QoL 16-3). EXAM: 1. Ultrasound-guided vascular access of the left radial artery. 2. Selective catheterization and angiography of the left internal iliac, anterior division left internal iliac, left vesicular artery, left internal pudendal artery, and left prostatic artery. 3. Cone beam CT. 4. Selective catheterization and angiography of the right internal iliac artery and right prostatic artery. 5. Prostate artery embolization. MEDICATIONS: Ciprofloxacin  400 mg IV. The antibiotic was administered within 1 hour of the procedure. 400 mcg nitroglycerin , intra arterial. ANESTHESIA/SEDATION: Moderate (conscious) sedation was employed during this procedure. A total of Versed  4 mg and Fentanyl  200 mcg was administered intravenously. Moderate Sedation Time: 138 minutes. The patient's level of consciousness and vital signs were monitored continuously by radiology nursing throughout the procedure under my direct supervision. CONTRAST:  41mL VISIPAQUE  IODIXANOL  320 MG/ML IV SOLN, 41mL VISIPAQUE  IODIXANOL  320 MG/ML IV SOLN, 41mL VISIPAQUE  IODIXANOL  320 MG/ML IV SOLN FLUOROSCOPY: Radiation Exposure Index (as provided by the fluoroscopic device): 2,626 mGy Kerma COMPLICATIONS: None immediate. PROCEDURE: Informed consent was obtained from the patient following explanation of the procedure, risks, benefits and alternatives. The patient understands, agrees and consents for the procedure. All  questions were addressed. A time out was performed prior to the initiation of the procedure. Maximal barrier sterile technique utilized including caps, mask, sterile gowns, sterile gloves, large sterile drape, hand hygiene, and Betadine prep. The left wrist was prepped and draped in standard fashion. Pulse oximeter was attached to the left thumb. Art Largo test was performed, grade A. The left radial artery measured 0.4 cm in diameter. Subdermal Local anesthesia was provided at the planned needle entry site with 1% lidocaine . A small skin nick was made. Under direct ultrasound visualization, the left radial artery was punctured with a 21 gauge micropuncture needle. A permanent image was captured and stored in the record. A microwire was placed and exchanged for a 4/5 French slender sheath. The sheath was flushed followed by installation of standard radial cocktail. A 5 Fr MG2 catheter Bentson wire were then directed to the level of the aortic arch. There was difficulty selecting the descending thoracic aorta. Therefore, the catheter was exchanged for a pigtail catheter which along with the Alline Ivans and [facility] wires, was unable to select the descending thoracic aorta. Therefore, the MG2 catheter was exchanged for an MG1 catheter which along with a Glidewire was able to select the descending thoracic aorta. The  wire was exchanged for a Bentson wire and the catheter was exchanged for the MG2 catheter which was directed to the level of the left internal iliac artery. Internal iliac angiogram was performed which demonstrated patency of the anterior and posterior divisions. Correlating with preoperative CT imaging, the prostatic artery appeared to arise from the proximal internal pudendal artery. Using a 1.9 [place] triple angle lambda Progreat microcatheter and Aristotle 14 standard microwire, the presumed prostatic artery was selected with moderate difficulty. Dedicated angiogram was performed which demonstrated no evidence  of definite prostatic arterial supply. Cone beam CT was performed at this location which demonstrated catheter location within the vesicular artery. There is no evidence of significant prostatic parenchymal opacification. The catheter was retracted to the level of the internal pudendal artery which was selected. Angiogram was performed which demonstrated a branch arising from the proximal to mid internal pudendal artery which possibly represent the prostatic artery. The artery was selected and angiogram was performed confirming prostatic parenchymal opacification. Cone beam CT was performed which confirmed left hemi prostatic supply without evidence of nontarget branch vessels. 100 mcg nitroglycerin  was administered at this location followed by the administration of a dilute solution of 400 micron Hydropearls in until hemostasis was achieved. The catheter was retracted and completion angiogram of the left prostatic artery demonstrated complete embolization of the left hemi prostate. The microcatheter was removed. The base catheter was then retracted to the level of the aortic bifurcation and advanced into the right internal iliac artery. Dedicated angiogram demonstrated patency of the anterior and posterior divisions with the prostatic artery appearing to arise from the proximal internal pudendal artery. 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 A Glidewire was then used to advance the MG2 catheter to the level of the internal pudendal ostium. With moderate difficulty, the 1.9 [place] triple angle Progreat lambda microcatheter and Aristotle 14 microwire were then used to select the right prostatic artery. Right prostatic angiogram was performed which demonstrated opacification of the right hemi prostate without evidence of none target branch vessels. Cone beam CT was performed at this location which confirmed opacification of the right hemi prostate. There is no evidence  of nontarget branch vessels. One hundred mcg nitroglycerin  was administered to this location followed by the administration of a dilute solution of 400 micron hydro pearls until hemostasis was achieved. Catheter was retracted slightly and completion right prostatic angiogram demonstrated complete embolization of the right hemi prostate. The catheters were removed. A TR band was applied to the left wrist and inflated. The sheath was removed. The patient tolerated the procedure well was transferred to the recovery area in good condition. IMPRESSION: Technically successful bilateral prostatic artery embolization. Creasie Doctor, MD [facility] Radiology Electronically Signed   By: Creasie Doctor M.D.   On: 01/21/2024 21:41   IR CT PELVIS W/CM Result Date: 01/21/2024 INDICATION: 68 year old male with a history of treated bladder cancer and benign prostatic hyperplasia (~207 g) with moderate lower urinary tract symptoms (IPSS-QoL 16-3). EXAM: 1. Ultrasound-guided vascular access of the left radial artery. 2. Selective catheterization and angiography of the left internal iliac, anterior division left internal iliac, left vesicular artery, left internal pudendal artery, and left prostatic artery. 3. Cone beam CT. 4. Selective catheterization and angiography of the right internal iliac artery and right prostatic artery. 5. Prostate artery embolization. MEDICATIONS: Ciprofloxacin  400 mg IV. The antibiotic was administered within 1 hour of the procedure. 400 mcg nitroglycerin , intra  arterial. ANESTHESIA/SEDATION: Moderate (conscious) sedation was employed during this procedure. A total of Versed  4 mg and Fentanyl  200 mcg was administered intravenously. Moderate Sedation Time: 138 minutes. The patient's level of consciousness and vital signs were monitored continuously by radiology nursing throughout the procedure under my direct supervision. CONTRAST:  41mL VISIPAQUE  IODIXANOL   320 MG/ML IV SOLN, 41mL VISIPAQUE  IODIXANOL  320 MG/ML IV SOLN, 41mL VISIPAQUE  IODIXANOL  320 MG/ML IV SOLN FLUOROSCOPY: Radiation Exposure Index (as provided by the fluoroscopic device): 2,626 mGy Kerma COMPLICATIONS: None immediate. PROCEDURE: Informed consent was obtained from the patient following explanation of the procedure, risks, benefits and alternatives. The patient understands, agrees and consents for the procedure. All questions were addressed. A time out was performed prior to the initiation of the procedure. Maximal barrier sterile technique utilized including caps, mask, sterile gowns, sterile gloves, large sterile drape, hand hygiene, and Betadine prep. The left wrist was prepped and draped in standard fashion. Pulse oximeter was attached to the left thumb. Art Largo test was performed, grade A. The left radial artery measured 0.4 cm in diameter. Subdermal Local anesthesia was provided at the planned needle entry site with 1% lidocaine . A small skin nick was made. Under direct ultrasound visualization, the left radial artery was punctured with a 21 gauge micropuncture needle. A permanent image was captured and stored in the record. A microwire was placed and exchanged for a 4/5 French slender sheath. The sheath was flushed followed by installation of standard radial cocktail. A 5 Fr MG2 catheter Bentson wire were then directed to the level of the aortic arch. There was difficulty selecting the descending thoracic aorta. Therefore, the catheter was exchanged for a pigtail catheter which along with the Alline Ivans and [facility] wires, was unable to select the descending thoracic aorta. Therefore, the MG2 catheter was exchanged for an MG1 catheter which along with a Glidewire was able to select the descending thoracic aorta. The wire was exchanged for a Bentson wire and the catheter was exchanged for the MG2 catheter which was directed to the level of the left internal iliac artery. Internal iliac angiogram was  performed which demonstrated patency of the anterior and posterior divisions. Correlating with preoperative CT imaging, the prostatic artery appeared to arise from the proximal internal pudendal artery. Using a 1.9 [place] triple angle lambda Progreat microcatheter and Aristotle 14 standard microwire, the presumed prostatic artery was selected with moderate difficulty. Dedicated angiogram was performed which demonstrated no evidence of definite prostatic arterial supply. Cone beam CT was performed at this location which demonstrated catheter location within the vesicular artery. There is no evidence of significant prostatic parenchymal opacification. The catheter was retracted to the level of the internal pudendal artery which was selected. Angiogram was performed which demonstrated a branch arising from the proximal to mid internal pudendal artery which possibly represent the prostatic artery. The artery was selected and angiogram was performed confirming prostatic parenchymal opacification. Cone beam CT was performed which confirmed left hemi prostatic supply without evidence of nontarget branch vessels. 100 mcg nitroglycerin  was administered at this location followed by the administration of a dilute solution of 400 micron Hydropearls in until hemostasis was achieved. The catheter was retracted and completion angiogram of the left prostatic artery demonstrated complete embolization of the left hemi prostate. The microcatheter was removed. The base catheter was then retracted to the level of the aortic bifurcation and advanced into the right internal iliac artery. Dedicated angiogram demonstrated patency of the anterior and posterior divisions with the prostatic artery appearing to  arise from the proximal internal pudendal artery. 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 A Glidewire was then used to advance the MG2 catheter to the level of the internal pudendal ostium.  With moderate difficulty, the 1.9 [place] triple angle Progreat lambda microcatheter and Aristotle 14 microwire were then used to select the right prostatic artery. Right prostatic angiogram was performed which demonstrated opacification of the right hemi prostate without evidence of none target branch vessels. Cone beam CT was performed at this location which confirmed opacification of the right hemi prostate. There is no evidence of nontarget branch vessels. One hundred mcg nitroglycerin  was administered to this location followed by the administration of a dilute solution of 400 micron hydro pearls until hemostasis was achieved. Catheter was retracted slightly and completion right prostatic angiogram demonstrated complete embolization of the right hemi prostate. The catheters were removed. A TR band was applied to the left wrist and inflated. The sheath was removed. The patient tolerated the procedure well was transferred to the recovery area in good condition. IMPRESSION: Technically successful bilateral prostatic artery embolization. Creasie Doctor, MD [facility] Inter[facility] Special[facility] Radiology Electronically Signed   By: Creasie Doctor M.D.   On: 01/21/2024 21:41   IR ABD/PEL BILAT ART BRANCH 3RD ORDER Result Date: 01/21/2024 INDICATION: 68 year old male with a history of treated bladder cancer and benign prostatic hyperplasia (~207 g) with moderate lower urinary tract symptoms (IPSS-QoL 16-3). EXAM: 1. Ultrasound-guided vascular access of the left radial artery. 2. Selective catheterization and angiography of the left internal iliac, anterior division left internal iliac, left vesicular artery, left internal pudendal artery, and left prostatic artery. 3. Cone beam CT. 4. Selective catheterization and angiography of the right internal iliac artery and right prostatic artery. 5. Prostate artery embolization. MEDICATIONS: Ciprofloxacin  400 mg IV. The antibiotic was administered within 1  hour of the procedure. 400 mcg nitroglycerin , intra arterial. ANESTHESIA/SEDATION: Moderate (conscious) sedation was employed during this procedure. A total of Versed  4 mg and Fentanyl  200 mcg was administered intravenously. Moderate Sedation Time: 138 minutes. The patient's level of consciousness and vital signs were monitored continuously by radiology nursing throughout the procedure under my direct supervision. CONTRAST:  41mL VISIPAQUE  IODIXANOL  320 MG/ML IV SOLN, 41mL VISIPAQUE  IODIXANOL  320 MG/ML IV SOLN, 41mL VISIPAQUE  IODIXANOL  320 MG/ML IV SOLN FLUOROSCOPY: Radiation Exposure Index (as provided by the fluoroscopic device): 2,626 mGy Kerma COMPLICATIONS: None immediate. PROCEDURE: Informed consent was obtained from the patient following explanation of the procedure, risks, benefits and alternatives. The patient understands, agrees and consents for the procedure. All questions were addressed. A time out was performed prior to the initiation of the procedure. Maximal barrier sterile technique utilized including caps, mask, sterile gowns, sterile gloves, large sterile drape, hand hygiene, and Betadine prep. The left wrist was prepped and draped in standard fashion. Pulse oximeter was attached to the left thumb. Art Largo test was performed, grade A. The left radial artery measured 0.4 cm in diameter. Subdermal Local anesthesia was provided at the planned needle entry site with 1% lidocaine . A small skin nick was made. Under direct ultrasound visualization, the left radial artery was punctured with a 21 gauge micropuncture needle. A permanent image was captured and stored in the record. A microwire was placed and exchanged for a 4/5 French slender sheath. The sheath was flushed followed by installation of standard radial cocktail. A 5 Fr MG2 catheter Bentson wire were then directed to the level of the aortic arch.  There was difficulty selecting the descending thoracic aorta. Therefore, the catheter was exchanged  for a pigtail catheter which along with the Alline Ivans and [facility] wires, was unable to select the descending thoracic aorta. Therefore, the MG2 catheter was exchanged for an MG1 catheter which along with a Glidewire was able to select the descending thoracic aorta. The wire was exchanged for a Bentson wire and the catheter was exchanged for the MG2 catheter which was directed to the level of the left internal iliac artery. Internal iliac angiogram was performed which demonstrated patency of the anterior and posterior divisions. Correlating with preoperative CT imaging, the prostatic artery appeared to arise from the proximal internal pudendal artery. Using a 1.9 [place] triple angle lambda Progreat microcatheter and Aristotle 14 standard microwire, the presumed prostatic artery was selected with moderate difficulty. Dedicated angiogram was performed which demonstrated no evidence of definite prostatic arterial supply. Cone beam CT was performed at this location which demonstrated catheter location within the vesicular artery. There is no evidence of significant prostatic parenchymal opacification. The catheter was retracted to the level of the internal pudendal artery which was selected. Angiogram was performed which demonstrated a branch arising from the proximal to mid internal pudendal artery which possibly represent the prostatic artery. The artery was selected and angiogram was performed confirming prostatic parenchymal opacification. Cone beam CT was performed which confirmed left hemi prostatic supply without evidence of nontarget branch vessels. 100 mcg nitroglycerin  was administered at this location followed by the administration of a dilute solution of 400 micron Hydropearls in until hemostasis was achieved. The catheter was retracted and completion angiogram of the left prostatic artery demonstrated complete embolization of the left hemi prostate. The microcatheter was removed. The base catheter was then  retracted to the level of the aortic bifurcation and advanced into the right internal iliac artery. Dedicated angiogram demonstrated patency of the anterior and posterior divisions with the prostatic artery appearing to arise from the proximal internal pudendal artery. 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 A Glidewire was then used to advance the MG2 catheter to the level of the internal pudendal ostium. With moderate difficulty, the 1.9 [place] triple angle Progreat lambda microcatheter and Aristotle 14 microwire were then used to select the right prostatic artery. Right prostatic angiogram was performed which demonstrated opacification of the right hemi prostate without evidence of none target branch vessels. Cone beam CT was performed at this location which confirmed opacification of the right hemi prostate. There is no evidence of nontarget branch vessels. One hundred mcg nitroglycerin  was administered to this location followed by the administration of a dilute solution of 400 micron hydro pearls until hemostasis was achieved. Catheter was retracted slightly and completion right prostatic angiogram demonstrated complete embolization of the right hemi prostate. The catheters were removed. A TR band was applied to the left wrist and inflated. The sheath was removed. The patient tolerated the procedure well was transferred to the recovery area in good condition. IMPRESSION: Technically successful bilateral prostatic artery embolization. Creasie Doctor, MD [facility] Radiology Electronically Signed   By: Creasie Doctor M.D.   On: 01/21/2024 21:41   IR CT PELVIS W/CM Result Date: 01/21/2024 INDICATION: 68 year old male with a history of treated bladder cancer and benign prostatic hyperplasia (~207 g) with moderate lower urinary tract symptoms (IPSS-QoL 16-3). EXAM: 1. Ultrasound-guided vascular access of the left radial  artery. 2. Selective catheterization and angiography of the left internal iliac,  anterior division left internal iliac, left vesicular artery, left internal pudendal artery, and left prostatic artery. 3. Cone beam CT. 4. Selective catheterization and angiography of the right internal iliac artery and right prostatic artery. 5. Prostate artery embolization. MEDICATIONS: Ciprofloxacin  400 mg IV. The antibiotic was administered within 1 hour of the procedure. 400 mcg nitroglycerin , intra arterial. ANESTHESIA/SEDATION: Moderate (conscious) sedation was employed during this procedure. A total of Versed  4 mg and Fentanyl  200 mcg was administered intravenously. Moderate Sedation Time: 138 minutes. The patient's level of consciousness and vital signs were monitored continuously by radiology nursing throughout the procedure under my direct supervision. CONTRAST:  41mL VISIPAQUE  IODIXANOL  320 MG/ML IV SOLN, 41mL VISIPAQUE  IODIXANOL  320 MG/ML IV SOLN, 41mL VISIPAQUE  IODIXANOL  320 MG/ML IV SOLN FLUOROSCOPY: Radiation Exposure Index (as provided by the fluoroscopic device): 2,626 mGy Kerma COMPLICATIONS: None immediate. PROCEDURE: Informed consent was obtained from the patient following explanation of the procedure, risks, benefits and alternatives. The patient understands, agrees and consents for the procedure. All questions were addressed. A time out was performed prior to the initiation of the procedure. Maximal barrier sterile technique utilized including caps, mask, sterile gowns, sterile gloves, large sterile drape, hand hygiene, and Betadine prep. The left wrist was prepped and draped in standard fashion. Pulse oximeter was attached to the left thumb. Art Largo test was performed, grade A. The left radial artery measured 0.4 cm in diameter. Subdermal Local anesthesia was provided at the planned needle entry site with 1% lidocaine . A small skin nick was made. Under direct ultrasound visualization, the left radial artery was  punctured with a 21 gauge micropuncture needle. A permanent image was captured and stored in the record. A microwire was placed and exchanged for a 4/5 French slender sheath. The sheath was flushed followed by installation of standard radial cocktail. A 5 Fr MG2 catheter Bentson wire were then directed to the level of the aortic arch. There was difficulty selecting the descending thoracic aorta. Therefore, the catheter was exchanged for a pigtail catheter which along with the Alline Ivans and [facility] wires, was unable to select the descending thoracic aorta. Therefore, the MG2 catheter was exchanged for an MG1 catheter which along with a Glidewire was able to select the descending thoracic aorta. The wire was exchanged for a Bentson wire and the catheter was exchanged for the MG2 catheter which was directed to the level of the left internal iliac artery. Internal iliac angiogram was performed which demonstrated patency of the anterior and posterior divisions. Correlating with preoperative CT imaging, the prostatic artery appeared to arise from the proximal internal pudendal artery. Using a 1.9 [place] triple angle lambda Progreat microcatheter and Aristotle 14 standard microwire, the presumed prostatic artery was selected with moderate difficulty. Dedicated angiogram was performed which demonstrated no evidence of definite prostatic arterial supply. Cone beam CT was performed at this location which demonstrated catheter location within the vesicular artery. There is no evidence of significant prostatic parenchymal opacification. The catheter was retracted to the level of the internal pudendal artery which was selected. Angiogram was performed which demonstrated a branch arising from the proximal to mid internal pudendal artery which possibly represent the prostatic artery. The artery was selected and angiogram was performed confirming prostatic parenchymal opacification. Cone beam CT was performed which confirmed left hemi  prostatic supply without evidence of nontarget branch vessels. 100 mcg nitroglycerin  was administered at this location followed by the administration of a dilute solution of 400 micron Hydropearls in until hemostasis was achieved. The catheter was  retracted and completion angiogram of the left prostatic artery demonstrated complete embolization of the left hemi prostate. The microcatheter was removed. The base catheter was then retracted to the level of the aortic bifurcation and advanced into the right internal iliac artery. Dedicated angiogram demonstrated patency of the anterior and posterior divisions with the prostatic artery appearing to arise from the proximal internal pudendal artery. 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 A Glidewire was then used to advance the MG2 catheter to the level of the internal pudendal ostium. With moderate difficulty, the 1.9 [place] triple angle Progreat lambda microcatheter and Aristotle 14 microwire were then used to select the right prostatic artery. Right prostatic angiogram was performed which demonstrated opacification of the right hemi prostate without evidence of none target branch vessels. Cone beam CT was performed at this location which confirmed opacification of the right hemi prostate. There is no evidence of nontarget branch vessels. One hundred mcg nitroglycerin  was administered to this location followed by the administration of a dilute solution of 400 micron hydro pearls until hemostasis was achieved. Catheter was retracted slightly and completion right prostatic angiogram demonstrated complete embolization of the right hemi prostate. The catheters were removed. A TR band was applied to the left wrist and inflated. The sheath was removed. The patient tolerated the procedure well was transferred to the recovery area in good condition. IMPRESSION: Technically successful bilateral prostatic artery embolization. Creasie Doctor, MD [facility] Radiology Electronically Signed   By: Creasie Doctor M.D.   On: 01/21/2024 21:41    Assessment & Plan:   Problem List Items Addressed This Visit     DM type 2, controlled, with complication (HCC)   Worse Monitor A1c Amaryl   4 mg bid before (max dose) Metformin  bid 500 mg - pt stopped Rybelsus  - d/c. Start Ozempic      Relevant Medications   Semaglutide ,0.25 or 0.5MG /DOS, (OZEMPIC, 0.25 OR 0.5 MG/DOSE,) 2 MG/3ML SOPN   Other Relevant Orders   Hemoglobin A1c   Comprehensive metabolic panel with GFR   Polyneuropathy due to type 2 diabetes mellitus (HCC)   Improve DM control F/u w/Dr Felipe Horton on Friday      Relevant Medications   Semaglutide ,0.25 or 0.5MG /DOS, (OZEMPIC, 0.25 OR 0.5 MG/DOSE,) 2 MG/3ML SOPN   Bladder cancer (HCC)   H/o BCG treatments      OAB (overactive bladder) - Primary   OAB/BPH On Cialis per Urology 5 mg/d - not helping. Cialis 20 mg q 3 d prn Try Detrol  bid Improve DM control!          Meds ordered this encounter  Medications   Semaglutide ,0.25 or 0.5MG /DOS, (OZEMPIC, 0.25 OR 0.5 MG/DOSE,) 2 MG/3ML SOPN    Sig: Use 0.25 mg weekly sq for 1 month, then 0.5 mg sq weekly    Dispense:  3 mL    Refill:  2   tolterodine (DETROL) 2 MG tablet    Sig: Take 1 tablet (2 mg total) by mouth 2 (two) times daily.    Dispense:  60 tablet    Refill:  5   tadalafil (CIALIS) 20 MG tablet    Sig: Take 1 tablet (20 mg total) by mouth every three (3) days as needed for erectile dysfunction.    Dispense:  10 tablet    Refill:  5      Follow-up: Return in about 3 months (around 06/18/2024) for a follow-up visit.  Alex  Cyla Haluska, MD

## 2024-03-18 NOTE — Assessment & Plan Note (Signed)
 Improve DM control F/u w/Dr Felipe Horton on Friday

## 2024-03-18 NOTE — Assessment & Plan Note (Addendum)
 OAB/BPH On Cialis per Urology 5 mg/d - not helping. Cialis 20 mg q 3 d prn Try Detrol  bid Improve DM control!

## 2024-03-20 ENCOUNTER — Telehealth: Admitting: Neurosurgery

## 2024-03-24 ENCOUNTER — Telehealth: Payer: Self-pay | Admitting: Internal Medicine

## 2024-03-24 NOTE — Telephone Encounter (Signed)
 Copied from CRM [id number]. Topic: Clinical - Medication Question >> Mar 24, 2024  1:36 PM Marlan Silva wrote: Reason for CRM: Patient states that he has recently started ozempic  and that e was taking rybekllsus, glimperide and metformin . Patients question is now that he is on the ozempic , does he stop the other medications as well. Patient is requesting a call back to [phone]

## 2024-03-26 NOTE — Telephone Encounter (Signed)
 Stop Rybelsus .  Continue all other medicines along with the Ozempic .  Thanks

## 2024-03-27 ENCOUNTER — Telehealth: Payer: Self-pay | Admitting: Internal Medicine

## 2024-03-27 NOTE — Telephone Encounter (Signed)
 I speak to Pt and explained his medication changes. Pt verbalized understanding reciting back the medication changes to me correctly.

## 2024-03-27 NOTE — Telephone Encounter (Signed)
 Pt call back to the office I informed him to stop Rybelsus . Continue all other medications along with the Ozempic . Pt verbalize understanding of PCP instructions.

## 2024-03-27 NOTE — Telephone Encounter (Signed)
 LVM for the Pt. To call the office back about his medication change.

## 2024-03-27 NOTE — Telephone Encounter (Signed)
 Copied from CRM [id number]. Topic: Clinical - Medication Question >> Mar 27, 2024  2:29 PM Pam Bode wrote: Reason for CRM: Patient states that he has recently started ozempic  and that he was taking , glimperide and metformin . Patients question is now that he is on the ozempic , does he stop the other medications as well. Patient is requesting a callback to [phone]

## 2024-04-01 ENCOUNTER — Encounter: Payer: Self-pay | Admitting: Internal Medicine

## 2024-04-07 ENCOUNTER — Other Ambulatory Visit: Payer: Self-pay | Admitting: Internal Medicine

## 2024-04-07 MED ORDER — OZEMPIC (1 MG/DOSE) 2 MG/1.5ML ~~LOC~~ SOPN: 1.0000 mg | PEN_INJECTOR | SUBCUTANEOUS | 3 refills | Status: DC

## 2024-04-12 ENCOUNTER — Other Ambulatory Visit: Payer: Self-pay | Admitting: Internal Medicine

## 2024-04-20 ENCOUNTER — Encounter: Payer: Self-pay | Admitting: Neurosurgery

## 2024-04-21 ENCOUNTER — Other Ambulatory Visit: Payer: Self-pay | Admitting: Internal Medicine

## 2024-04-22 ENCOUNTER — Telehealth: Payer: Self-pay

## 2024-04-22 DIAGNOSIS — E118 Type 2 diabetes mellitus with unspecified complications: Secondary | ICD-10-CM

## 2024-04-22 DIAGNOSIS — G5611 Other lesions of median nerve, right upper limb: Secondary | ICD-10-CM

## 2024-04-22 DIAGNOSIS — G5631 Lesion of radial nerve, right upper limb: Secondary | ICD-10-CM

## 2024-04-22 NOTE — Telephone Encounter (Signed)
 Please see below for information in regards to your upcoming surgery:   Planned surgery: Right sided posterior interosseous nerve (PIN) decompression, possible supinator to posterior interosseous nerve (PIN) nerve transfer, possible flexor carpi radialis (FCR) to posterior interosseous nerve (PIN) nerve transfer    Surgery date: *** at [facility]: [address] [place], [place] [address]) - you will find out your arrival time the business day before your surgery.   Pre-op appointment at [facility] Pre-admit Testing: you will receive a call with a date/time for this appointment. If you are scheduled for an in person appointment, Pre-admit Testing is located on the first floor of the [facility] building, [address]. During this appointment, they will advise you which medications you can take the morning of surgery, and which medications you will need to hold for surgery. Labs (such as blood work, EKG) may be done at your pre-op appointment. You are not required to fast for these labs. Should you need to change your pre-op appointment, please call Pre-admit testing at [phone].     Diabetes/weight loss medications: Per anesthesia guidelines (due to the increased risk of aspiration caused by delayed gastric emptying):  Semaglutide  (Ozempic ) injections: hold for 7 days prior to surgery  Metformin : hold for 2 days prior to surgery    Surgical clearance: we will send a clearance form to ***. They may wish to see you in their office prior to signing the clearance form. If so, they may call you to schedule an appointment.    How to contact us :  If you have any questions/concerns before or after surgery, you can reach us  at [phone], or you can send a mychart message. We can be reached by phone or mychart 8am-4pm, Monday-Friday.  *Please note: Calls after 4pm are forwarded to a third party answering service. Mychart  messages are not routinely monitored during evenings, weekends, and holidays. Please call our office to contact the answering service for urgent concerns during non-business hours.     If you have FMLA/disability paperwork, please drop it off or fax it to [phone]   Appointments/FMLA & disability paperwork: Reche [employer] Registered Nurse/Surgery scheduler: Donette Mainwaring, RN Certified Medical Assistants: Don, CMA, Elenor, CMA, & Damien, CMA Physician Assistants: Lyle Decamp, PA-C, Edsel Goods, PA-C & Glade Boys, PA-C Surgeons: Penne Sharps, MD & Reeves Daisy, MD   [facility] PREADMIT TESTING VISIT and SURGERY INFORMATION SHEET   Now that surgery has been scheduled you can anticipate several phone calls from [facility] services. A pharmacy technician will call you to verify your current list of medications taken at home.               The Pre-Service Center will call to verify your insurance information and to give you billing estimates and information.             The Preadmit Testing Office will be calling to schedule a visit to obtain information for the anesthesia team and provide instructions on preparation for surgery.  What can you expect for the Preadmit Testing Visit: Appointments may be scheduled in-person or by telephone.  If a telephone visit is scheduled, you may be asked to come into the office to have lab tests or other studies performed.   This visit will not be completed any greater than 14 days prior to your surgery.  If your surgery has been scheduled for a future date, please do not be  alarmed if we have not contacted you to schedule an appointment more than a month prior to the surgery date.    Please be prepared to provide the following information during this appointment:            -Personal medical history                                               -Medication and allergy list            -Any history of problems with  anesthesia              -Recent lab work or diagnostic studies            -Please notify us  of any needs we should be aware of to provide the best care possible           -You will be provided with instructions on how to prepare for your surgery.    On The Day of Surgery:  You must have a driver to take you home after surgery, you will be asked not to drive for 24 hours following surgery.  Taxi, Gisele and non-medical transport will not be acceptable means of transportation unless you have a responsible individual who will be traveling with you.  Visitors in the surgical area:   2 people will be able to visit you in your room once your preparation for surgery has been completed. During surgery, your visitors will be asked to wait in the Surgery Waiting Area.  It is not a requirement for them to stay, if they prefer to leave and come back.  Your visitor(s) will be given an update once the surgery has been completed.  No visitors are allowed in the initial recovery room to respect patient privacy and safety.  Once you are more awake and transfer to the secondary recovery area, or are transferred to an inpatient room, visitors will again be able to see you.  To respect and protect your privacy: We will ask on the day of surgery who your driver will be and what the contact number for that individual will be. We will ask if it is okay to share information with this individual, or if there is an alternative individual that we, or the surgeon, should contact to provide updates and information. If family or friends come to the surgical information desk requesting information about you, who you have not listed with us , no information will be given.   It may be helpful to designate someone as the main contact who will be responsible for updating your other friends and family.    PREADMIT TESTING OFFICE: [phone] SAME DAY SURGERY: [phone] We look forward to caring for you before and throughout  the process of your surgery.

## 2024-04-22 NOTE — Telephone Encounter (Signed)
 Sent mychart message to patient regarding preferred timeframe

## 2024-04-23 NOTE — Telephone Encounter (Signed)
-----   Message from Penne LELON Sharps sent at 04/23/2024  3:27 PM EDT ----- Regarding: RE: Posting Sheet Yes that makes sense ----- Message ----- From: Cy Leech, RN Sent: 04/23/2024  11:06 AM EDT To: Penne LELON Sharps, MD Subject: RE: Posting Sheet                              Plan for repeat A1c in September with a goal of 7.5 or less or a fructosamine test at any time after 3 weeks of sugars under 180? ----- Message ----- From: Sharps Penne LELON, MD Sent: 04/23/2024   7:28 AM EDT To: Leech Cy, RN Subject: RE: Posting Sheet                              Good call, thanks ideally would be down. Increased risk of issues with elevated glucose ----- Message ----- From: Khalee Mazo, RN Sent: 04/22/2024   5:59 PM EDT To: Penne LELON Sharps, MD Subject: RE: Posting Sheet                              Dorise pointed out that his A1c is 9 (done 03/17/24). Is that OK for this surgery/patient? Looks like his PCP added ozempic  to his regimen a couple of weeks ago. ----- Message ----- From: Sharps Penne LELON, MD Sent: 04/22/2024   4:14 PM EDT To: Leech Cy, RN Subject: RE: Posting Sheet                              Posterior  interosseous nerve palsy ----- Message ----- From: Cy Leech, RN Sent: 04/22/2024   3:38 PM EDT To: Penne LELON Sharps, MD Subject: RE: Posting Sheet                              Indication for consent? 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

## 2024-04-23 NOTE — Telephone Encounter (Signed)
 Patient read your mychart message.  He wants surgery asap, any day is fine.  He is currently taking Ozempic  1mg . He was told to take metformin , glimepiride  along with the ozempic . He gets really bad indigestion and big burps. So sometimes he does not take metformin . He says he probably takes it every other day.   No aspirin   You can call him or send him a [employer].

## 2024-04-23 NOTE — Telephone Encounter (Signed)
 I spoke with [name] [name]. This past Sunday was his 2nd ozempic  injection.  I explained that we cannot proceed with surgery until his A1c is 7.5 or less. Alternatively, he can go to the lab for a fructosamine test once he has 3 consecutive weeks of blood sugars under 180. If he meets this criteria and his fructosamine test is under 300, we can move forward with a surgery date.  I explained the rationale for why we cannot proceed with surgery while his blood sugars are elevated.   He states he checks his blood sugars sometimes, but he will start checking more frequently.  He would like to see if he can have the fructosamine test at his PCP's office or [facility] because he lives in [place]. I have placed the order and he is going to discuss this with his PCP.  Per his conversation with Odetta this morning (documented under the mychart message dated 04/20/24): He is currently taking Ozempic  1mg . He was told to take metformin , glimepiride  along with the ozempic . He gets really bad indigestion and big burps. So sometimes he does not take metformin . He says he probably takes it every other day. No aspirin 

## 2024-04-26 ENCOUNTER — Other Ambulatory Visit: Payer: Self-pay | Admitting: Internal Medicine

## 2024-05-03 ENCOUNTER — Encounter: Payer: Self-pay | Admitting: Internal Medicine

## 2024-05-04 ENCOUNTER — Other Ambulatory Visit: Payer: Self-pay | Admitting: Internal Medicine

## 2024-05-04 ENCOUNTER — Telehealth: Payer: Self-pay

## 2024-05-04 DIAGNOSIS — E118 Type 2 diabetes mellitus with unspecified complications: Secondary | ICD-10-CM

## 2024-05-04 MED ORDER — CONTOUR NEXT TEST VI STRP: ORAL_STRIP | 5 refills | Status: DC

## 2024-05-04 NOTE — Telephone Encounter (Signed)
 FYI Only or Action Required?: Action required by provider: medication refill request.  Patient was last seen in primary care on 03/18/2024 by Plotnikov, Karlynn GAILS, MD.  Called Nurse Triage reporting No chief complaint on file..  Symptoms began today.  Interventions attempted: Nothing.  Symptoms are: stable.  Triage Disposition: No disposition on file.  Patient/caregiver understands and will follow disposition?:

## 2024-05-04 NOTE — Telephone Encounter (Signed)
 Copied from CRM [id number]. Topic: Clinical - Medication Refill >> May 04, 2024 10:49 AM Rosina BIRCH wrote: Medication: diabetic test strips (Patient stated he sent a mychart medication request)  Has the patient contacted their pharmacy? Yes (Agent: If no, request that the patient contact the pharmacy for the refill. If patient does not wish to contact the pharmacy document the reason why and proceed with request.) (Agent: If yes, when and what did the pharmacy advise?)  This is the patient's preferred pharmacy:  [id number] - [place], [place] - [address] AT [address] OF [address] [place] [address] Phone: [phone] Fax: [phone]  Is this the correct pharmacy for this prescription? Yes If no, delete pharmacy and type the correct one.   Has the prescription been filled recently? Yes  Is the patient out of the medication? Yes  Has the patient been seen for an appointment in the last year OR does the patient have an upcoming appointment? Yes  Can we respond through MyChart? Yes  Agent: Please be advised that Rx refills may take up to 3 business days. We ask that you follow-up with your pharmacy.

## 2024-05-04 NOTE — Telephone Encounter (Signed)
 Mr Whitecotton called in to provide an update since our last call on 04/23/24. He reports that he has been checking his blood sugars and they have been running 156-180. He ran out of test strips today and he is worried he will not be able to keep track of his blood sugar to know if he is hitting his goal of blood sugars under 180. He has called his PCP for a refill and is waiting for a response (cash price for test strips is $75). I informed him that I can see a documented telephone call from his PCP and it appears they are actively working on his request. In the meantime, I encouraged him to keep doing what he has been doing.

## 2024-05-05 NOTE — Telephone Encounter (Unsigned)
 Copied from CRM [id number]. Topic: Clinical - Prescription Issue >> May 04, 2024  5:45 PM Rea C wrote: Reason for CRM: [facility] and [facility] and advised patient to call the clinic. I did not see a refusal reason for the test strips.  Patient stated he needs to monitor his glucose levels  for upcoming surgery.   glucose blood (CONTOUR NEXT TEST) test strip  [phone] (M)

## 2024-05-09 ENCOUNTER — Other Ambulatory Visit: Payer: Self-pay | Admitting: Internal Medicine

## 2024-05-09 DIAGNOSIS — E118 Type 2 diabetes mellitus with unspecified complications: Secondary | ICD-10-CM

## 2024-05-13 ENCOUNTER — Other Ambulatory Visit: Payer: Self-pay | Admitting: *Deleted

## 2024-05-13 DIAGNOSIS — E118 Type 2 diabetes mellitus with unspecified complications: Secondary | ICD-10-CM

## 2024-05-13 MED ORDER — CONTOUR TEST VI STRP: ORAL_STRIP | 5 refills | Status: AC

## 2024-05-13 NOTE — Telephone Encounter (Unsigned)
 Copied from CRM [id number]. Topic: General - Call Back - No Documentation >> May 13, 2024 11:48 AM Melissa C wrote: Reason for CRM: patient received a call today from the nurse that he missed, I do not see documentation yet regarding the call but he thinks it has to do with his glucose test strips that I see previous documentation about. Please advise with patient regarding this issue

## 2024-05-13 NOTE — Telephone Encounter (Signed)
 Pt already aware Rx was sent.

## 2024-05-13 NOTE — Telephone Encounter (Signed)
 See other message. Pt aware test strips sent to pharmacy.

## 2024-05-13 NOTE — Telephone Encounter (Signed)
 Spoke to pt, asked him how often are you checking sugars? Pt said daily. told him Rx for test strips for Contour Next was sent to pharmacy. Pt verbalized understanding.

## 2024-05-13 NOTE — Telephone Encounter (Signed)
 Left message on voicemail to call office. Need to know how many times a day he is checking his sugar?

## 2024-05-14 ENCOUNTER — Other Ambulatory Visit: Payer: Self-pay | Admitting: Internal Medicine

## 2024-05-14 ENCOUNTER — Other Ambulatory Visit: Payer: Self-pay

## 2024-05-14 DIAGNOSIS — E118 Type 2 diabetes mellitus with unspecified complications: Secondary | ICD-10-CM

## 2024-05-14 MED ORDER — CONTOUR NEXT TEST VI STRP: ORAL_STRIP | 12 refills | Status: DC

## 2024-05-15 ENCOUNTER — Ambulatory Visit: Payer: Medicare Other

## 2024-05-15 VITALS — Ht 72.0 in | Wt 180.0 lb

## 2024-05-15 DIAGNOSIS — E118 Type 2 diabetes mellitus with unspecified complications: Secondary | ICD-10-CM | POA: Diagnosis not present

## 2024-05-15 DIAGNOSIS — Z0000 Encounter for general adult medical examination without abnormal findings: Secondary | ICD-10-CM

## 2024-05-15 NOTE — Patient Instructions (Addendum)
 [name] [name] , Thank you for taking time out of your busy schedule to complete your Annual Wellness Visit with me. I enjoyed our conversation and look forward to speaking with you again next year. I, as well as your care team,  appreciate your ongoing commitment to your health goals. Please review the following plan we discussed and let me know if I can assist you in the future. Your Game plan/ To Do List    Referrals: If you haven't heard from the office you've been referred to, please reach out to them at the phone provided.   Follow up Visits: We will see or speak with you next year for your Next Medicare AWV with our clinical staff Have you seen your provider in the last 6 months (3 months if uncontrolled diabetes)? Yes  Clinician Recommendations:  Aim for 30 minutes of exercise or brisk walking, 6-8 glasses of water , and 5 servings of fruits and vegetables each day. Educated and advised on getting the Pneumonia vaccine in 2025.      This is a list of the screenings recommended for you:  Health Maintenance  Topic Date Due   Yearly kidney health urinalysis for diabetes  Never done   Pneumococcal Vaccine for age over 65 (1 of 2 - PCV) Never done   Complete foot exam   06/04/2017   COVID-19 Vaccine (4 - 2024-25 season) 06/16/2023   Hemoglobin A1C  09/16/2024   Eye exam for diabetics  10/24/2024   Yearly kidney function blood test for diabetes  03/17/2025   Medicare Annual Wellness Visit  05/15/2025   DTaP/Tdap/Td vaccine (4 - Td or Tdap) 06/04/2026   Colon Cancer Screening  09/28/2029   Hepatitis C Screening  Completed   Hepatitis B Vaccine  Aged Out   HPV Vaccine  Aged Out   Meningitis B Vaccine  Aged Out   Flu Shot  Discontinued   Zoster (Shingles) Vaccine  Discontinued    Advanced directives: (Copy Requested) Please bring a copy of your health care power of attorney and living will to the office to be added to your chart at your convenience. You can mail to [facility]  [address] [place], [place] [address] or email to [email or web address] Advance Care Planning is important because it:  [x]  Makes sure you receive the medical care that is consistent with your values, goals, and preferences  [x]  It provides guidance to your family and loved ones and reduces their decisional burden about whether or not they are making the right decisions based on your wishes.  Follow the link provided in your after visit summary or read over the paperwork we have mailed to you to help you started getting your Advance Directives in place. If you need assistance in completing these, please reach out to us  so that we can help you!

## 2024-05-15 NOTE — Telephone Encounter (Unsigned)
 Copied from CRM [id number]. Topic: Clinical - Prescription Issue >> May 15, 2024  1:24 PM [name] [name] wrote: Reason for CRM: Patient is calling because he states the wrong CONTOUR NEXT TEST test strip [[id number]] was sent in. He sent a message through mychart yesterday and hasn't received a response. He does not want to go into the weekend without the strips. Patient is expecting a call no later than end of day today.

## 2024-05-15 NOTE — Progress Notes (Cosign Needed Addendum)
 Subjective:   [name] [name] is a 68 y.o. who presents for a Medicare Wellness preventive visit.  As a reminder, Annual Wellness Visits don't include a physical exam, and some assessments may be limited, especially if this visit is performed virtually. We may recommend an in-person follow-up visit with your provider if needed.  Visit Complete: Virtual I connected with  [name] [name] on 05/15/24 by a audio enabled telemedicine application and verified that I am speaking with the correct person using two identifiers.  Patient Location: Home  Provider Location: Office/Clinic  I discussed the limitations of evaluation and management by telemedicine. The patient expressed understanding and agreed to proceed.  Vital Signs: Because this visit was a virtual/telehealth visit, some criteria may be missing or patient reported. Any vitals not documented were not able to be obtained and vitals that have been documented are patient reported.  VideoDeclined- This patient declined Librarian, academic. Therefore the visit was completed with audio only.  Persons Participating in Visit: Patient.  AWV Questionnaire: No: Patient Medicare AWV questionnaire was not completed prior to this visit.  Cardiac Risk Factors include: advanced age (>38men, >51 women);diabetes mellitus;dyslipidemia;hypertension;male gender;smoking/ tobacco exposure     Objective:    Today's Vitals   05/15/24 0852  Weight: 180 lb (81.6 kg)  Height: 6' (1.829 m)   Body mass index is 24.41 kg/m.     05/15/2024    8:53 AM 01/29/2024   11:54 AM 01/21/2024   11:18 AM 01/15/2024   10:51 AM 11/27/2023   11:29 AM 11/22/2023   10:12 AM 10/30/2023   11:16 AM  Advanced Directives  Does Patient Have a Medical Advance Directive? Yes No No Yes Yes Yes No  Type of Estate agent of [place];Living will   Healthcare Power of [place];Living will Healthcare Power of [place];Living will Living  will   Does patient want to make changes to medical advance directive?     No - Patient declined No - Patient declined   Copy of Healthcare Power of Attorney in Chart? No - copy requested    No - copy requested    Would patient like information on creating a medical advance directive?  Yes (MAU/Ambulatory/Procedural Areas - Information given) No - Patient declined        Current Medications (verified) Outpatient Encounter Medications as of 05/15/2024  Medication Sig   amLODipine  (NORVASC ) 5 MG tablet Take 1 tablet (5 mg total) by mouth daily.   Ascorbic Acid (VITAMIN C) 1000 MG tablet Take 1,000 mg by mouth once a week.   blood glucose meter kit and supplies KIT Use to test blood sugar once a day. DX: E11.9   Blood Glucose Monitoring Suppl (CONTOUR NEXT MONITOR) w/Device KIT 1 Device by Does not apply route daily.   cholecalciferol (VITAMIN D ) 1000 UNITS tablet Take 1,000 Units by mouth daily.   clobetasol  (TEMOVATE ) 0.05 % external solution Apply 1 Application topically 2 (two) times daily as needed. Itchy scalp   CONTOUR NEXT TEST test strip USE AS INSTRUCTED   cyclobenzaprine  (FLEXERIL ) 5 MG tablet TAKE 1 TABLET BY MOUTH THREE TIMES A DAY AS NEEDED FOR MUSCLE SPASMS   doxycycline  (VIBRA -TABS) 100 MG tablet Take 1 tablet (100 mg total) by mouth 2 (two) times daily.   DULoxetine  (CYMBALTA ) 30 MG capsule TAKE 1 CAPSULE (30 MG TOTAL) BY MOUTH AT BEDTIME.   glimepiride  (AMARYL ) 4 MG tablet TAKE 1 TABLET (4 MG TOTAL) BY MOUTH IN THE MORNING AND  AT BEDTIME   glucose blood (CONTOUR TEST) test strip USE TO CHECK BLOOD SUGAR DAILY AND AS NEEDED.   HYDROcodone -acetaminophen  (NORCO) 10-325 MG tablet Take 1 tablet by mouth every 6 (six) hours as needed for severe pain (pain score 7-10).   ibuprofen  (ADVIL ) 800 MG tablet TAKE 1 TABLET (800 MG TOTAL) BY MOUTH DAILY AS NEEDED FOR MODERATE PAIN.   Lancets MISC Use to check blood sugar once per day.   metFORMIN  (GLUCOPHAGE ) 500 MG tablet Take 1 tablet (500 mg  total) by mouth daily with breakfast.   mupirocin  ointment (BACTROBAN ) 2 % On leg wound w/dressing change qd or bid   olmesartan  (BENICAR ) 40 MG tablet Take 1 tablet (40 mg total) by mouth daily.   potassium chloride  SA (KLOR-CON ) 20 MEQ tablet Take 1 tablet (20 mEq total) by mouth daily.   Semaglutide , 1 MG/DOSE, (OZEMPIC , 1 MG/DOSE,) 2 MG/1.5ML SOPN Inject 1 mg into the skin once a week. Use 1 mg sq weekly.   tadalafil  (CIALIS ) 20 MG tablet Take 1 tablet (20 mg total) by mouth every three (3) days as needed for erectile dysfunction.   tolterodine  (DETROL ) 2 MG tablet Take 1 tablet (2 mg total) by mouth 2 (two) times daily.   Facility-Administered Encounter Medications as of 05/15/2024  Medication   gemcitabine  (GEMZAR ) chemo syringe for bladder instillation 2,000 mg    Allergies (verified) Tamsulosin , Metformin  and related, Codeine, and Wound dressing adhesive   History: Past Medical History:  Diagnosis Date   Benign localized prostatic hyperplasia with lower urinary tract symptoms (LUTS)    urologist--- dr watt   Bladder cancer [facility])    urologist---- dr watt---  s/p TURBT 11/2020; 03/ 2022   Coronary artery disease    mild   DDD (degenerative disc disease), cervical    From 1980s   Diabetes mellitus type II    followed by pcp--   (01-05-2021  per pt checks blood sugar three times per week,  fasting sugar --- 130--140_   Difficult intubation 07/28/2012   partial nephrectomy done @[facility]  07-28-2012  found to have anterior larynx , diffcult intubation, referred to anesthesia record in epic    Dyslipidemia    ED (erectile dysfunction)    Elevated PSA    GERD (gastroesophageal reflux disease)    History of adenomatous polyp of colon    History of kidney stones    History of prostatitis 2008   History of renal carcinoma urologist--- dr watt   07-28-2012 for renal neoplasm  s/p right partal nephrectomy    Hypertension    followed by pcp   Neuromuscular disorder (HCC)     Osteoarthritis    Back   Renal carcinoma, right [facility])    Past Surgical History:  Procedure Laterality Date   CATARACT EXTRACTION     COLONOSCOPY N/A 03/03/2013   Procedure: COLONOSCOPY;  Surgeon: Norleen LOISE Kiang, MD;  Location: [facility];  Service: Endoscopy;  Laterality: N/A;   COLONOSCOPY WITH PROPOFOL  N/A 09/29/2019   Procedure: COLONOSCOPY WITH PROPOFOL ;  Surgeon: Kiang Norleen LOISE, MD;  Location: [facility];  Service: Endoscopy;  Laterality: N/A;   CYSTOSCOPY WITH BIOPSY N/A 12/09/2020   Procedure: CYSTOSCOPY WITH TURBT, BIOPSY AND FULGURATION;  Surgeon: watt Norleen, MD;  Location: [facility];  Service: Urology;  Laterality: N/A;  REQUESTING 45 MINS   EXTRACORPOREAL SHOCK WAVE LITHOTRIPSY  01-10-2015;  03-03-2012  @[facility]    IR ANGIOGRAM PELVIS SELECTIVE OR SUPRASELECTIVE  01/21/2024   IR ANGIOGRAM SELECTIVE EACH ADDITIONAL VESSEL  01/21/2024   IR ANGIOGRAM SELECTIVE EACH ADDITIONAL VESSEL  01/21/2024   IR ANGIOGRAM SELECTIVE EACH ADDITIONAL VESSEL  01/21/2024   IR EMBO TUMOR ORGAN ISCHEMIA INFARCT INC GUIDE ROADMAPPING  01/21/2024   IR RADIOLOGIST EVAL & MGMT  12/23/2023   IR US  GUIDE VASC ACCESS LEFT  01/21/2024   IR US  GUIDE VASC ACCESS LEFT  01/21/2024   IR US  GUIDE VASC ACCESS RIGHT  01/21/2024   POLYPECTOMY  09/29/2019   Procedure: POLYPECTOMY;  Surgeon: Abran Norleen SAILOR, MD;  Location: [facility];  Service: Endoscopy;;   ROBOT ASSISTED LAPAROSCOPIC PARTIAL NEPHRECTOMY  07-28-2012   DR RENDA  @[facility]    TONSILLECTOMY  1965   TRANSURETHRAL RESECTION OF BLADDER TUMOR N/A 11/27/2023   Procedure: TRANSURETHRAL RESECTION OF BLADDER TUMOR (TURBT), BILATERAL RETROGRADE;  Surgeon: Watt Norleen, MD;  Location: [facility];  Service: Urology;  Laterality: N/A;  60 MINUTE CASE   TRANSURETHRAL RESECTION OF BLADDER TUMOR WITH MITOMYCIN -C N/A 01/06/2021   Procedure: TRANSURETHRAL RESECTION OF BLADDER TUMOR WITH GEMCITIBINE IN PACU;  Surgeon: Watt Norleen, MD;  Location[facility];  Service: Urology;  Laterality:  N/A;   TRANSURETHRAL RESECTION OF BLADDER TUMOR WITH MITOMYCIN -C N/A 09/01/2021   Procedure: TRANSURETHRAL RESECTION OF BLADDER TUMOR WITH POST OPERATIVE INSTILLATION OF GEMCITABINE ;  Surgeon: Watt Norleen, MD;  Location[facility];  Service: Urology;  Laterality: N/A;   TRANSURETHRAL RESECTION OF BLADDER TUMOR WITH MITOMYCIN -C Bilateral 11/13/2022   Procedure: CYSTOSCOPY TRANSURETHRAL RESECTION OF BLADDER TUMOR BILATERAL RETROGRADES WITH INSTILL GEMCTABINE;  Surgeon: Watt Norleen, MD;  Location: [facility];  Service: Urology;  Laterality: Bilateral;  1 HR FOR CASE   Family History  Problem Relation Age of Onset   Lymphoma Mother    Colon cancer Mother 64   Dementia Father    Colon cancer Paternal Aunt    Esophageal cancer Neg Hx    Rectal cancer Neg Hx    Stomach cancer Neg Hx    Social History   Socioeconomic History   Marital status: Married    Spouse name: Not on file   Number of children: Not on file   Years of education: Not on file   Highest education level: Some college, no degree  Occupational History   Not on file  Tobacco Use   Smoking status: Every Day    Current packs/day: 0.25    Average packs/day: 0.3 packs/day for 54.6 years (13.6 ttl pk-yrs)    Types: Cigarettes    Start date: 10/15/1969   Smokeless tobacco: Never   Tobacco comments:    1 pack every 2 days  Vaping Use   Vaping status: Never Used  Substance and Sexual Activity   Alcohol use: Not Currently   Drug use: Yes    Types: Marijuana    Comment: Smokes marijuana every day   Sexual activity: Yes  Other Topics Concern   Not on file  Social History Narrative   Occupation: printerCurrent smokerRegular exercise- noMarried      Are you right handed or left handed? Both right and left    Are you currently employed ?    What is your current occupation?   Do you live at home alone? No    Who lives with you? Wife    What type of home do you live in: 1 story or 2 story? Lives in one story home       Social Drivers of Health   Financial Resource Strain: Low Risk  (05/15/2024)  Overall Financial Resource Strain (CARDIA)    Difficulty of Paying Living Expenses: Not hard at all  Food Insecurity: No Food Insecurity (05/15/2024)   Hunger Vital Sign    Worried About Running Out of Food in the Last Year: Never true    Ran Out of Food in the Last Year: Never true  Transportation Needs: No Transportation Needs (05/15/2024)   PRAPARE - Administrator, Civil Service (Medical): No    Lack of Transportation (Non-Medical): No  Physical Activity: Sufficiently Active (05/15/2024)   Exercise Vital Sign    Days of Exercise per Week: 5 days    Minutes of Exercise per Session: 30 min  Stress: Stress Concern Present (05/15/2024)   [employer] of Occupational Health - Occupational Stress Questionnaire    Feeling of Stress: To some extent  Social Connections: Socially Integrated (05/15/2024)   Social Connection and Isolation Panel    Frequency of Communication with Friends and Family: More than three times a week    Frequency of Social Gatherings with Friends and Family: More than three times a week    Attends Religious Services: 1 to 4 times per year    Active Member of [employer] or Organizations: No    Attends Engineer, structural: More than 4 times per year    Marital Status: Married    Tobacco Counseling Ready to quit: Not Answered Counseling given: Not Answered Tobacco comments: 1 pack every 2 days    Clinical Intake:  Pre-visit preparation completed: Yes  Pain : No/denies pain     BMI - recorded: 24.41 Nutritional Status: BMI of 19-24  Normal Nutritional Risks: None Diabetes: Yes CBG done?: No Did pt. bring in CBG monitor from home?: No  Lab Results  Component Value Date   HGBA1C 9.0 (H) 03/17/2024   HGBA1C 8.8 (H) 11/22/2023   HGBA1C 7.4 (H) 08/26/2023     How often do you need to have someone help you when you read instructions, pamphlets, or other  written materials from your doctor or pharmacy?: 1 - Never  Interpreter Needed?: No  Information entered by :: Verdie Saba, CMA   Activities of Daily Living     05/15/2024    8:57 AM 11/22/2023   10:13 AM  In your present state of health, do you have any difficulty performing the following activities:  Hearing? 0   Vision? 0   Difficulty concentrating or making decisions? 0   Walking or climbing stairs? 0   Dressing or bathing? 0   Doing errands, shopping? 0 0  Preparing Food and eating ? N   Using the Toilet? N   In the past six months, have you accidently leaked urine? Y   Do you have problems with loss of bowel control? N   Managing your Medications? N   Managing your Finances? N   Housekeeping or managing your Housekeeping? N     Patient Care Team: Plotnikov, Karlynn GAILS, MD as PCP - General Watt Rush, MD as Attending Physician (Urology) Patel, Donika K, DO as Consulting Physician (Neurology) Kreamer, Sterrett, OD (Optometry) Claudene Penne ORN, MD as Consulting Physician (Neurosurgery)  I have updated your Care Teams any recent Medical Services you may have received from other providers in the past year.     Assessment:   This is a routine wellness examination for [employer].  Hearing/Vision screen Hearing Screening - Comments:: Denies hearing difficulties   Vision Screening - Comments:: Wears rx glasses - up to date with  routine eye exams with [facility]   Goals Addressed               This Visit's Progress     Patient Stated (pt-stated)        Patient stated he plans to continue monitoring his blood sugar levels and manage medications.         Depression Screen     05/15/2024    8:58 AM 08/26/2023   10:02 AM 04/23/2023    9:18 AM 01/23/2023    9:18 AM 12/17/2022    9:03 AM 10/23/2022    3:26 PM 08/30/2022    3:26 PM  PHQ 2/9 Scores  PHQ - 2 Score 0 0 0 0 0 0 0  PHQ- 9 Score 1   1 0      Fall Risk     05/15/2024    8:57 AM 01/15/2024   10:51 AM 10/30/2023    11:16 AM 08/26/2023   10:02 AM 08/23/2023   10:59 AM  Fall Risk   Falls in the past year? 0 0 0 0 0  Number falls in past yr: 0 0 0 0 0  Injury with Fall? 0 0 0 0 0  Risk for fall due to : No Fall Risks   No Fall Risks   Follow up Falls evaluation completed;Falls prevention discussed Falls evaluation completed Falls evaluation completed Falls evaluation completed Falls evaluation completed    MEDICARE RISK AT HOME:  Medicare Risk at Home Any stairs in or around the home?: Yes (outside) If so, are there any without handrails?: No Home free of loose throw rugs in walkways, pet beds, electrical cords, etc?: Yes Adequate lighting in your home to reduce risk of falls?: Yes Life alert?: No Use of a cane, walker or w/c?: No Grab bars in the bathroom?: No Shower chair or bench in shower?: No Elevated toilet seat or a handicapped toilet?: Yes  TIMED UP AND GO:  Was the test performed?  No  Cognitive Function: 6CIT completed        05/15/2024    9:01 AM 08/30/2022    3:26 PM  6CIT Screen  What Year? 0 points 0 points  What month? 0 points 0 points  What time? 0 points 0 points  Count back from 20 0 points 0 points  Months in reverse 0 points 0 points  Repeat phrase 0 points 0 points  Total Score 0 points 0 points    Immunizations Immunization History  Administered Date(s) Administered   PFIZER(Purple Top)SARS-COV-2 Vaccination 01/23/2020, 02/15/2020, 10/05/2020   Td 01/13/2006, 09/15/2009   Tdap 06/04/2016    Screening Tests Health Maintenance  Topic Date Due   Diabetic kidney evaluation - Urine ACR  Never done   Pneumococcal Vaccine: 50+ Years (1 of 2 - PCV) Never done   FOOT EXAM  06/04/2017   COVID-19 Vaccine (4 - 2024-25 season) 06/16/2023   HEMOGLOBIN A1C  09/16/2024   OPHTHALMOLOGY EXAM  10/24/2024   Diabetic kidney evaluation - eGFR measurement  03/17/2025   Medicare Annual Wellness (AWV)  05/15/2025   DTaP/Tdap/Td (4 - Td or Tdap) 06/04/2026   Colonoscopy   09/28/2029   Hepatitis C Screening  Completed   Hepatitis B Vaccines  Aged Out   HPV VACCINES  Aged Out   Meningococcal B Vaccine  Aged Out   INFLUENZA VACCINE  Discontinued   Zoster Vaccines- Shingrix  Discontinued    Health Maintenance  Health Maintenance Due  Topic Date Due  Diabetic kidney evaluation - Urine ACR  Never done   Pneumococcal Vaccine: 50+ Years (1 of 2 - PCV) Never done   FOOT EXAM  06/04/2017   COVID-19 Vaccine (4 - 2024-25 season) 06/16/2023   Health Maintenance Items Addressed:  Labs Ordered: Diabetic Kidney Urine ACR  I have recommended that this patient have a immunization for Pneumonia but he declines at this time. I have discussed the risks and benefits of this procedure with him. The patient verbalizes understanding.   Additional Screening:  Vision Screening: Recommended annual ophthalmology exams for early detection of glaucoma and other disorders of the eye. Would you like a referral to an eye doctor? No    Dental Screening: Recommended annual dental exams for proper oral hygiene  Community Resource Referral / Chronic Care Management: CRR required this visit?  No   CCM required this visit?  No   Plan:    I have personally reviewed and noted the following in the patient's chart:   Medical and social history Use of alcohol, tobacco or illicit drugs  Current medications and supplements including opioid prescriptions. Patient is currently taking opioid prescriptions. Information provided to patient regarding non-opioid alternatives. Patient advised to discuss non-opioid treatment plan with their provider. Functional ability and status Nutritional status Physical activity Advanced directives List of other physicians Hospitalizations, surgeries, and ER visits in previous 12 months Vitals Screenings to include cognitive, depression, and falls Referrals and appointments  In addition, I have reviewed and discussed with patient certain  preventive protocols, quality metrics, and best practice recommendations. A written personalized care plan for preventive services as well as general preventive health recommendations were provided to patient.   Verdie CHRISTELLA Saba, CMA   05/15/2024   After Visit Summary: (MyChart) Due to this being a telephonic visit, the after visit summary with patients personalized plan was offered to patient via MyChart   Notes: Nothing significant to report at this time.  Medical screening examination/treatment/procedure(s) were performed by non-physician practitioner and as supervising physician I was immediately available for consultation/collaboration.  I agree with above. Karlynn Noel, MD

## 2024-05-18 ENCOUNTER — Other Ambulatory Visit: Payer: Self-pay

## 2024-05-18 ENCOUNTER — Telehealth: Payer: Self-pay | Admitting: Internal Medicine

## 2024-05-18 DIAGNOSIS — E118 Type 2 diabetes mellitus with unspecified complications: Secondary | ICD-10-CM

## 2024-05-18 MED ORDER — CONTOUR NEXT TEST VI STRP: 1.0000 | ORAL_STRIP | Freq: Every day | 12 refills | Status: DC

## 2024-05-18 NOTE — Telephone Encounter (Signed)
 Pharmacy needs how many times patient is checking his blood sugar for test strips there is no diressctions for his contour next test strips, if this could please be resent to the  [id number] [place] [place] - [address]

## 2024-05-19 ENCOUNTER — Encounter: Payer: Self-pay | Admitting: Neurology

## 2024-05-19 ENCOUNTER — Ambulatory Visit: Admitting: Neurology

## 2024-05-19 VITALS — BP 183/95 | HR 77 | Ht 66.0 in | Wt 175.0 lb

## 2024-05-19 DIAGNOSIS — G545 Neuralgic amyotrophy: Secondary | ICD-10-CM

## 2024-05-19 NOTE — Telephone Encounter (Signed)
 I was able to send in the updated rx with the frequency.

## 2024-05-19 NOTE — Progress Notes (Signed)
 Follow-up Visit   Date: 05/19/2024    [name] [name] MRN: [id number] DOB: 1956/08/24    [name] [name] is a 68 y.o. ambidextrousmale with diabetes mellitus, hyperlipidemia, hypertension, bladder cancer, CAD, right renal cancer, and GERD returning to the clinic for follow-up of right brachial plexopathy.  The patient was accompanied to the clinic by self.  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  He is awaiting surgery for PIN decompression and nerve transfer, however, this is delayed due to diabetes being poorly controlled.  He is working with his PCP to optimize this.  I encouraged healthy diet choices.  - Continue duloxetine  30mg  at bedtime, he seems to be taking this prn and was encouraged to take it as prescribed   Left ulnar neuropathy at the elbow, currently asymptomatic.  Diabetic neuropathy affecting the feet, mild.   Return to clinic in 6 months  --------------------------------------------- History of present illness: Around July, he began having right shoulder pain, sharp and throbbing.  He recalls having to sleep in a recliner for several weeks.  His pain eventually resolved, but then he noticed weakness in the right hand, such that he is unable to extend his wrist or fingers.  He takes gabapentin  300mg  three times daily which helps with arm tingling.  Prior testing includes MRI cervical spine which shows disc herniation at right C5-6.  NCS/EMG performed here is consistent with right brachial plexopathy affecting primarily the middle and inferior trunks.  He is here is for further evaluation.    He reports having a similar problem in the neck in the late 1980s, which was treated conservatively.   UPDATE 08/23/2023:  He is here for acute follow-up with complains of left hand tingling and numbness involving the 4th and 5th fingers.  He denies hand weakness.  Symptoms are mild and not constant.  He was  concerned because the tingling felt the same as when his right arm started getting weak.  He has been doing OT but unfortunately not noticed any significant improvement of right hand and finger extension.  He takes gabapentin  200mg  twice daily.  He is most concern whether he has TB because he found out that his daughter has TB.  He denies fever, night sweats, weight loss, or new cough.   UPDATE 10/30/2023:  He is here for follow-up visit.  He has completed PT with mild improvement of strength in the right hand but he still is unable to extend the fingers.  He continues to have tingling in the arms and takes gabapentin  100mg  at bedtime.  He has side effects with higher dose.  He denies any new weakness. He is no longer having tingling in the left hand.  He had MRI brachial plexus on 1/11, however results are not available.   UPDATE 01/15/2024:  He is here for follow-up visit.  He has noticed that he's able to spread the fingers out a little better, but still not able to extend his fingers.  He continues to have spells of severe pain and takes gabapentin  100mg , but it makes him sleepy.  He is also undergoing evaluation for possible nerve transfer with Dr. Claudene (neurosurgery).   UPDATE 05/19/2024:  He is here for follow-up visit.  His nerve transfer surgery is delayed as his diabetes is not optimal for him to undergo surgery.  He is now on Ozempic  and having difficulty trying to get insurance to supply test strips so he can monitor  this at home.  There has been no change in the weakness of this right hand.  Pain is intermittent and responds to duloxetine  which he takes only as needed.  Medications:  Current Outpatient Medications on File Prior to Visit  Medication Sig Dispense Refill   amLODipine  (NORVASC ) 5 MG tablet Take 1 tablet (5 mg total) by mouth daily. 90 tablet 3   Ascorbic Acid (VITAMIN C) 1000 MG tablet Take 1,000 mg by mouth once a week.     blood glucose meter kit and supplies KIT Use to test  blood sugar once a day. DX: E11.9 1 each 0   Blood Glucose Monitoring Suppl (CONTOUR NEXT MONITOR) w/Device KIT 1 Device by Does not apply route daily. 1 kit 0   cholecalciferol (VITAMIN D ) 1000 UNITS tablet Take 1,000 Units by mouth daily.     clobetasol  (TEMOVATE ) 0.05 % external solution Apply 1 Application topically 2 (two) times daily as needed. Itchy scalp 50 mL 3   cyclobenzaprine  (FLEXERIL ) 5 MG tablet TAKE 1 TABLET BY MOUTH THREE TIMES A DAY AS NEEDED FOR MUSCLE SPASMS 90 tablet 1   DULoxetine  (CYMBALTA ) 30 MG capsule TAKE 1 CAPSULE (30 MG TOTAL) BY MOUTH AT BEDTIME. (Patient taking differently: Take 30 mg by mouth as needed.) 90 capsule 2   glimepiride  (AMARYL ) 4 MG tablet TAKE 1 TABLET (4 MG TOTAL) BY MOUTH IN THE MORNING AND AT BEDTIME 180 tablet 3   glucose blood (CONTOUR NEXT TEST) test strip 1 each by Other route daily. Use to check blood sugars twice a day. 100 strip 12   glucose blood (CONTOUR TEST) test strip USE TO CHECK BLOOD SUGAR DAILY AND AS NEEDED. 100 strip 5   HYDROcodone -acetaminophen  (NORCO) 10-325 MG tablet Take 1 tablet by mouth every 6 (six) hours as needed for severe pain (pain score 7-10). 40 tablet 0   ibuprofen  (ADVIL ) 800 MG tablet TAKE 1 TABLET (800 MG TOTAL) BY MOUTH DAILY AS NEEDED FOR MODERATE PAIN. 30 tablet 0   Lancets MISC Use to check blood sugar once per day. 100 each 3   metFORMIN  (GLUCOPHAGE ) 500 MG tablet Take 1 tablet (500 mg total) by mouth daily with breakfast. (Patient taking differently: Take 500 mg by mouth daily with breakfast. As needed) 90 tablet 3   mupirocin  ointment (BACTROBAN ) 2 % On leg wound w/dressing change qd or bid 30 g 0   olmesartan  (BENICAR ) 40 MG tablet Take 1 tablet (40 mg total) by mouth daily. 90 tablet 3   potassium chloride  SA (KLOR-CON ) 20 MEQ tablet Take 1 tablet (20 mEq total) by mouth daily. (Patient taking differently: Take 20 mEq by mouth as needed.) 90 tablet 11   Semaglutide , 1 MG/DOSE, (OZEMPIC , 1 MG/DOSE,) 2  MG/1.5ML SOPN Inject 1 mg into the skin once a week. Use 1 mg sq weekly. 6 mL 3   tadalafil  (CIALIS ) 20 MG tablet Take 1 tablet (20 mg total) by mouth every three (3) days as needed for erectile dysfunction. 10 tablet 5   tolterodine  (DETROL ) 2 MG tablet Take 1 tablet (2 mg total) by mouth 2 (two) times daily. 60 tablet 5   doxycycline  (VIBRA -TABS) 100 MG tablet Take 1 tablet (100 mg total) by mouth 2 (two) times daily. (Patient not taking: Reported on 05/19/2024) 14 tablet 0   Current Facility-Administered Medications on File Prior to Visit  Medication Dose Route Frequency Provider Last Rate Last Admin   gemcitabine  (GEMZAR ) chemo syringe for bladder instillation 2,000 mg  2,000 mg  Bladder Instillation Once Watt Rush, MD        Allergies:  Allergies  Allergen Reactions   Tamsulosin  Other (See Comments)    Unable to perform sexually   Metformin  And Related     Upset stomach   Codeine Itching and Rash   Wound Dressing Adhesive Itching and Other (See Comments)    Band-Aid adhesive    Vital Signs:  BP (!) 173/98   Pulse 77   Ht 5' 6 (1.676 m)   Wt 175 lb (79.4 kg)   SpO2 98%   BMI 28.25 kg/m    Neurological Exam: MENTAL STATUS including orientation to time, place, person, recent and remote memory, attention span and concentration, language, and fund of knowledge is normal.  Speech is not dysarthric.  CRANIAL NERVES:  Pupils equal round and reactive to light.  Normal conjugate, extra-ocular eye movements in all directions of gaze.  No ptosis.  Face is symmetric.   Upper Extremity:  Right   Left  Deltoid  5/5    5/5   Biceps  5/5    5/5   Triceps  5/5    5/5   Wrist extensors  5-/5    5/5   Wrist flexors  5/5    5/5   Finger extensors  1/5    5/5   Finger flexors  4+/5    5/5   Dorsal interossei  4/5    5/5   Abductor pollicis  4/5    5/5   Tone (Ashworth scale)  0   0    Lower Extremity:  Right   Left  Hip flexors  5/5    5/5   Knee extensors  5/5    5/5   Dorsiflexors   5/5    5/5   Plantarflexors  5/5    5/5   Toe extensors  5/5    5/5   Toe flexors  5/5    5/5   Tone (Ashworth scale)  0   0    MSRs:                                              Right        Left brachioradialis 2+   2+  biceps 2+   2+  triceps 2+   2+  patellar 2+   2+  ankle jerk 1+   1+  Hoffman no   no  plantar response down   down   COORDINATION/GAIT:    Gait narrow based and stable.   Data: MRI cervical spine wo contrast 07/03/2023: 1. 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 2. C5-6: Spondylosis with endplate osteophytes and protruding disc material more prominent in the right posterolateral direction. Effacement of the subarachnoid space with indentation of the cord, particularly on the right. AP diameter of the canal in the midline 7.4 mm. Foraminal stenosis right worse than left. In particular, the right C6 nerve root is likely affected. 3. C6-7: Endplate osteophytes and bulging of the disc more prominent towards the right. Moderate foraminal narrowing on the right that could possibly affect the C7 nerve. This is not as severe as the level above.   NCS/EMG of the right arm 07/18/2023: Right brachial plexopathy affecting the middle and inferior trunks, which is moderate-severe in degree electrically and with  evidence of ongoing denervation. Additionally, there is evidence of a chronic C7 radiculopathy (mild) and left ulnar neuropathy across the elbow (very mild).    Thank you for allowing me to participate in patient's care.  If I can answer any additional questions, I would be pleased to do so.    Sincerely,    Nazanin Kinner K. Tobie, DO

## 2024-05-20 NOTE — Telephone Encounter (Signed)
 Spoke with the pt and was able to get everything up dated and cleared.

## 2024-05-28 ENCOUNTER — Other Ambulatory Visit: Payer: Self-pay

## 2024-05-28 ENCOUNTER — Ambulatory Visit: Payer: Self-pay

## 2024-05-28 ENCOUNTER — Ambulatory Visit
Admission: EM | Admit: 2024-05-28 | Discharge: 2024-05-28 | Disposition: A | Discharge status: Home / Self Care | Source: Ambulatory Visit | Attending: Family Medicine | Admitting: Family Medicine

## 2024-05-28 DIAGNOSIS — R600 Localized edema: Secondary | ICD-10-CM | POA: Diagnosis not present

## 2024-05-28 NOTE — ED Provider Notes (Signed)
 [facility]    CSN: [id number] Arrival date & time: 05/28/24  1611      History   Chief Complaint Chief Complaint  Patient presents with   Facial Swelling    HPI [name] [name] is a 68 y.o. male presents for left-sided jaw swelling.  Patient reports 1 hour prior to arrival he was eating a sandwich when he had sudden onset of swelling of his left jaw/parotid area.  States it swelled up to the size of a golf ball and was hard.  Denies any redness warmth or injury.  No TMJ, ear pain, gum pain.  He wears dentures.  He states he has not taken anything for his symptoms and the swelling is 75% better and is no longer hard.  He denies any pain to the area with palpation.  No fevers or chills.  He states he is up-to-date on his MMR.  He denies any tongue/lip/throat or neck swelling.  No other concerns at this time.  HPI  Past Medical History:  Diagnosis Date   Benign localized prostatic hyperplasia with lower urinary tract symptoms (LUTS)    urologist--- dr watt   Bladder cancer [facility])    urologist---- dr watt---  s/p TURBT 11/2020; 03/ 2022   Coronary artery disease    mild   DDD (degenerative disc disease), cervical    From 1980s   Diabetes mellitus type II    followed by pcp--   (01-05-2021  per pt checks blood sugar three times per week,  fasting sugar --- 130--140_   Difficult intubation 07/28/2012   partial nephrectomy done @[facility]  07-28-2012  found to have anterior larynx , diffcult intubation, referred to anesthesia record in epic    Dyslipidemia    ED (erectile dysfunction)    Elevated PSA    GERD (gastroesophageal reflux disease)    History of adenomatous polyp of colon    History of kidney stones    History of prostatitis 2008   History of renal carcinoma urologist--- dr watt   07-28-2012 for renal neoplasm  s/p right partal nephrectomy    Hypertension    followed by pcp   Neuromuscular disorder (HCC)    Osteoarthritis    Back   Renal carcinoma, right  [facility])     Patient Active Problem List   Diagnosis Date Noted   OAB (overactive bladder) 03/18/2024   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/13/2024   History of renal carcinoma    Posterior interosseous mononeuropathy, 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   Exposure to TB 08/26/2023   Cervical radiculitis 04/30/2023   Diabetic neuropathy (HCC) 04/23/2023   Hiccups 02/05/2023   Upper abdominal pain 02/05/2023   Gastroesophageal reflux disease 02/05/2023   EKG abnormalities 02/05/2023   Scalp itch 01/23/2023   Tetrahydrocannabinol (THC) use disorder, moderate, dependence (HCC) 01/23/2023   Polyneuropathy due to type 2 diabetes mellitus (HCC) 10/23/2022   Bladder cancer (HCC) 10/23/2022   Memory problem 07/17/2022   Leg pain, anterior, right 12/26/2021   Gross hematuria 09/16/2021   Statin declined 08/06/2021   Acute abscess of face 08/03/2021   Atherosclerosis of aorta (HCC) 04/26/2021   Coronary atherosclerosis 01/08/2021   Ascending aortic aneurysm (HCC) 01/08/2021   Bladder neck obstruction 10/20/2019   History of colonic polyps    Benign neoplasm of ascending colon    Benign neoplasm of transverse colon    Viral illness 06/16/2019   Exposure to COVID-19 virus 02/02/2019   Cerumen impaction  08/27/2018   Pilonidal cyst 01/18/2017   Neoplasm of uncertain behavior of skin 01/18/2017   Hypokalemia 12/11/2016   Tobacco use disorder 10/17/2016   Well adult exam 06/22/2016   Polycythemia, 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   Abscessed tooth 07/06/2015   Sebaceous cyst 07/06/2015   Common cold virus 12/23/2014   Infected sebaceous cyst 11/09/2014   Dyslipidemia 05/19/2013   URI, acute 09/15/2012   Strep throat 09/15/2012   Renal cell cancer (HCC) 09/02/2012   Adrenal mass (HCC) 06/03/2012   Nephrolithiasis 06/03/2012   Otitis media 01/08/2012   Dark urine 01/08/2012   Decreased vision in both eyes 01/08/2012   Onychomycosis 05/25/2011   DM  type 2, controlled, with complication (HCC) 05/23/2010   Keloid scar 03/07/2010   SKIN RASH 03/07/2010   LACERATION, FINGER 09/15/2009   Osteoarthritis 08/15/2009   LOW BACK PAIN 08/15/2009   CELLULITIS AND ABSCESS OF BUTTOCK 03/29/2009   CERVICAL RADICULOPATHY 11/09/2008   WARTS, VIRAL, UNSPECIFIED 10/28/2007   Essential hypertension 06/30/2007   CLUSTER HEADACHE 06/27/2007   PSA, INCREASED 06/27/2007    Past Surgical History:  Procedure Laterality Date   CATARACT EXTRACTION     COLONOSCOPY N/A 03/03/2013   Procedure: COLONOSCOPY;  Surgeon: Norleen LOISE Kiang, MD;  Location: [facility];  Service: Endoscopy;  Laterality: N/A;   COLONOSCOPY WITH PROPOFOL  N/A 09/29/2019   Procedure: COLONOSCOPY WITH PROPOFOL ;  Surgeon: Kiang Norleen LOISE, MD;  Location: [facility];  Service: Endoscopy;  Laterality: N/A;   CYSTOSCOPY WITH BIOPSY N/A 12/09/2020   Procedure: CYSTOSCOPY WITH TURBT, BIOPSY AND FULGURATION;  Surgeon: Watt Norleen, MD;  Location: [facility];  Service: Urology;  Laterality: N/A;  REQUESTING 45 MINS   EXTRACORPOREAL SHOCK WAVE LITHOTRIPSY  01-10-2015;  03-03-2012  @[facility]    IR ANGIOGRAM PELVIS SELECTIVE OR SUPRASELECTIVE  01/21/2024   IR ANGIOGRAM SELECTIVE EACH ADDITIONAL VESSEL  01/21/2024   IR ANGIOGRAM SELECTIVE EACH ADDITIONAL VESSEL  01/21/2024   IR ANGIOGRAM SELECTIVE EACH ADDITIONAL VESSEL  01/21/2024   IR EMBO TUMOR ORGAN ISCHEMIA INFARCT INC GUIDE ROADMAPPING  01/21/2024   IR RADIOLOGIST EVAL & MGMT  12/23/2023   IR US  GUIDE VASC ACCESS LEFT  01/21/2024   IR US  GUIDE VASC ACCESS LEFT  01/21/2024   IR US  GUIDE VASC ACCESS RIGHT  01/21/2024   POLYPECTOMY  09/29/2019   Procedure: POLYPECTOMY;  Surgeon: Kiang Norleen LOISE, MD;  Location: [facility];  Service: Endoscopy;;   ROBOT ASSISTED LAPAROSCOPIC PARTIAL NEPHRECTOMY  07-28-2012   DR RENDA  @[facility]    TONSILLECTOMY  1965   TRANSURETHRAL RESECTION OF BLADDER TUMOR N/A 11/27/2023   Procedure: TRANSURETHRAL RESECTION OF BLADDER TUMOR (TURBT), BILATERAL  RETROGRADE;  Surgeon: Watt Norleen, MD;  Location: [facility];  Service: Urology;  Laterality: N/A;  60 MINUTE CASE   TRANSURETHRAL RESECTION OF BLADDER TUMOR WITH MITOMYCIN -C N/A 01/06/2021   Procedure: TRANSURETHRAL RESECTION OF BLADDER TUMOR WITH GEMCITIBINE IN PACU;  Surgeon: Watt Norleen, MD;  Location: [facility];  Service: Urology;  Laterality: N/A;   TRANSURETHRAL RESECTION OF BLADDER TUMOR WITH MITOMYCIN -C N/A 09/01/2021   Procedure: TRANSURETHRAL RESECTION OF BLADDER TUMOR WITH POST OPERATIVE INSTILLATION OF GEMCITABINE ;  Surgeon: Watt Norleen, MD;  Location: [facility];  Service: Urology;  Laterality: N/A;   TRANSURETHRAL RESECTION OF BLADDER TUMOR WITH MITOMYCIN -C Bilateral 11/13/2022   Procedure: CYSTOSCOPY TRANSURETHRAL RESECTION OF BLADDER TUMOR BILATERAL RETROGRADES WITH INSTILL GEMCTABINE;  Surgeon: Watt Norleen, MD;  Location: [facility];  Service: Urology;  Laterality: Bilateral;  1 HR FOR CASE  Home Medications    Prior to Admission medications   Medication Sig Start Date End Date Taking? Authorizing Provider  amLODipine  (NORVASC ) 5 MG tablet Take 1 tablet (5 mg total) by mouth daily. 09/18/23   Plotnikov, Aleksei V, MD  Ascorbic Acid (VITAMIN C) 1000 MG tablet Take 1,000 mg by mouth once a week.    [provider]  blood glucose meter kit and supplies KIT Use to test blood sugar once a day. DX: E11.9 12/19/16   Plotnikov, Karlynn GAILS, MD  Blood Glucose Monitoring Suppl (CONTOUR NEXT MONITOR) w/Device KIT 1 Device by Does not apply route daily. 12/08/19   Plotnikov, Aleksei V, MD  cholecalciferol (VITAMIN D ) 1000 UNITS tablet Take 1,000 Units by mouth daily.    [provider]  clobetasol  (TEMOVATE ) 0.05 % external solution Apply 1 Application topically 2 (two) times daily as needed. Itchy scalp 01/23/23   Plotnikov, Karlynn GAILS, MD  cyclobenzaprine  (FLEXERIL ) 5 MG tablet TAKE 1 TABLET BY MOUTH THREE TIMES A DAY AS NEEDED FOR MUSCLE SPASMS  09/29/23   Plotnikov, Karlynn GAILS, MD  doxycycline  (VIBRA -TABS) 100 MG tablet Take 1 tablet (100 mg total) by mouth 2 (two) times daily. Patient not taking: Reported on 05/19/2024 12/27/23   Plotnikov, Karlynn GAILS, MD  DULoxetine  (CYMBALTA ) 30 MG capsule TAKE 1 CAPSULE (30 MG TOTAL) BY MOUTH AT BEDTIME. Patient taking differently: Take 30 mg by mouth as needed. 02/10/24   Patel, Donika K, DO  glimepiride  (AMARYL ) 4 MG tablet TAKE 1 TABLET (4 MG TOTAL) BY MOUTH IN THE MORNING AND AT BEDTIME 04/21/24   Plotnikov, Aleksei V, MD  glucose blood (CONTOUR NEXT TEST) test strip 1 each by Other route daily. Use to check blood sugars twice a day. 05/18/24   Plotnikov, Aleksei V, MD  glucose blood (CONTOUR TEST) test strip USE TO CHECK BLOOD SUGAR DAILY AND AS NEEDED. 05/13/24   Plotnikov, Aleksei V, MD  HYDROcodone -acetaminophen  (NORCO) 10-325 MG tablet Take 1 tablet by mouth every 6 (six) hours as needed for severe pain (pain score 7-10). 11/27/23   Watt Rush, MD  ibuprofen  (ADVIL ) 800 MG tablet TAKE 1 TABLET (800 MG TOTAL) BY MOUTH DAILY AS NEEDED FOR MODERATE PAIN. 09/03/23   Plotnikov, Aleksei V, MD  Lancets MISC Use to check blood sugar once per day. 12/19/16   Plotnikov, Aleksei V, MD  metFORMIN  (GLUCOPHAGE ) 500 MG tablet Take 1 tablet (500 mg total) by mouth daily with breakfast. Patient taking differently: Take 500 mg by mouth daily with breakfast. As needed 04/27/24   Plotnikov, Aleksei V, MD  mupirocin  ointment (BACTROBAN ) 2 % On leg wound w/dressing change qd or bid 12/27/23   Plotnikov, Aleksei V, MD  olmesartan  (BENICAR ) 40 MG tablet Take 1 tablet (40 mg total) by mouth daily. 09/18/23   Plotnikov, Aleksei V, MD  potassium chloride  SA (KLOR-CON ) 20 MEQ tablet Take 1 tablet (20 mEq total) by mouth daily. Patient taking differently: Take 20 mEq by mouth as needed. 09/06/20   Plotnikov, Aleksei V, MD  Semaglutide , 1 MG/DOSE, (OZEMPIC , 1 MG/DOSE,) 2 MG/1.5ML SOPN Inject 1 mg into the skin once a week. Use 1 mg sq  weekly. 04/07/24   Plotnikov, Aleksei V, MD  tadalafil  (CIALIS ) 20 MG tablet Take 1 tablet (20 mg total) by mouth every three (3) days as needed for erectile dysfunction. 03/18/24 05/19/24  Plotnikov, Karlynn GAILS, MD  tolterodine  (DETROL ) 2 MG tablet Take 1 tablet (2 mg total) by mouth 2 (two) times daily. 03/18/24  Plotnikov, Karlynn GAILS, MD    Family History Family History  Problem Relation Age of Onset   Lymphoma Mother    Colon cancer Mother 78   Dementia Father    Colon cancer Paternal Aunt    Esophageal cancer Neg Hx    Rectal cancer Neg Hx    Stomach cancer Neg Hx     Social History Social History   Tobacco Use   Smoking status: Every Day    Current packs/day: 0.25    Average packs/day: 0.3 packs/day for 54.6 years (13.7 ttl pk-yrs)    Types: Cigarettes    Start date: 10/15/1969   Smokeless tobacco: Never   Tobacco comments:    1 pack every 2 days  Vaping Use   Vaping status: Never Used  Substance Use Topics   Alcohol use: Not Currently   Drug use: Yes    Types: Marijuana    Comment: Smokes marijuana every day     Allergies   Tamsulosin , Metformin  and related, Codeine, and Wound dressing adhesive   Review of Systems Review of Systems  HENT:         Swelling of left jaw/parotid gland area     Physical Exam Triage Vital Signs ED Triage Vitals  Encounter Vitals Group     BP 05/28/24 1627 (!) 156/91     Girls Systolic BP Percentile --      Girls Diastolic BP Percentile --      Boys Systolic BP Percentile --      Boys Diastolic BP Percentile --      Pulse Rate 05/28/24 1627 73     Resp 05/28/24 1627 16     Temp 05/28/24 1627 98.6 F (37 C)     Temp Source 05/28/24 1627 Oral     SpO2 05/28/24 1627 96 %     Weight --      Height --      Head Circumference --      Peak Flow --      Pain Score 05/28/24 1624 0     Pain Loc --      Pain Education --      Exclude from Growth Chart --    No data found.  Updated Vital Signs BP (!) 156/91   Pulse 73   Temp  98.6 F (37 C) (Oral)   Resp 16   SpO2 96%   Visual Acuity Right Eye Distance:   Left Eye Distance:   Bilateral Distance:    Right Eye Near:   Left Eye Near:    Bilateral Near:     Physical Exam Vitals and nursing note reviewed.  Constitutional:      General: He is not in acute distress.    Appearance: Normal appearance. He is not ill-appearing, toxic-appearing or diaphoretic.  HENT:     Head: Normocephalic and atraumatic.     Jaw: No tenderness or pain on movement.      Comments: Mild swelling of left parotid gland without erythema, warmth, tenderness.  No TMJ painAnd withoutMild swelling of the left parotid gland.  Full range of motion of jaw without pain or restriction.  There is no swelling of the neck or lymphadenopathy.  No facial swelling.    Mouth/Throat:     Lips: Pink.     Mouth: Mucous membranes are moist. Injury present. No oral lesions.     Dentition: No gingival swelling or gum lesions.     Pharynx: Oropharynx is clear. Uvula midline. No  pharyngeal swelling or uvula swelling.     Comments: No tenderness or discharge from Stensen's duct Eyes:     Pupils: Pupils are equal, round, and reactive to light.  Cardiovascular:     Rate and Rhythm: Normal rate.  Pulmonary:     Effort: Pulmonary effort is normal.  Skin:    General: Skin is warm and dry.  Neurological:     General: No focal deficit present.     Mental Status: He is alert and oriented to person, place, and time.  Psychiatric:        Mood and Affect: Mood normal.        Behavior: Behavior normal.      UC Treatments / Results  Labs (all labs ordered are listed, but only abnormal results are displayed) Labs Reviewed - No data to display  EKG   Radiology No results found.  Procedures Procedures (including critical care time)  Medications Ordered in UC Medications - No data to display  Initial Impression / Assessment and Plan / UC Course  I have reviewed the triage vital signs and the  nursing notes.  Pertinent labs & imaging results that were available during my care of the patient were reviewed by me and considered in my medical decision making (see chart for details).     Reviewed exam and symptoms with patient.  Patient with sudden onset of left parotid gland/facial swelling 1 hour ago that has nearly resolved.  No injury or pain.  Discussed case with Dr. Kriste.  Possible acute blocked salivary gland that has resolved quickly.  Encourage sucking on sour candies and monitoring symptoms closely.  If they do not continue to improve and/or they worsen, red flags reviewed, he is to go to the emergency room ASAP for imaging.  He should follow-up with his PCP in 1 to 2 days for recheck.  Patient verbalized understanding of these instructions. Final Clinical Impressions(s) / UC Diagnoses   Final diagnoses:  Swelling of left parotid gland     Discharge Instructions      Continue to monitor your symptoms closely and if they worsen in any way please go to the ER ASAP for evaluation.  You can try sucking on sour candies as well.  Please follow-up with your PCP in 1 to 2 days for recheck.     ED Prescriptions   None    PDMP not reviewed this encounter.   Loreda Myla SAUNDERS, NP 05/28/24 [phone]

## 2024-05-28 NOTE — ED Triage Notes (Signed)
 Pt states he was eating a sandwich and noticed there was a tight area on left side of face and felt his face and it felt hard. Pt states it felt like the size a golf ball, but has gone down now. Pt denies trouble breathing or dysphagia. Pt is able to maintain oral secretions. Pt has 1+ swelling of area on left side of face right in front of left ear. The area is soft.

## 2024-05-28 NOTE — Telephone Encounter (Signed)
 FYI Only or Action Required?: FYI only for provider.  Patient was last seen in primary care on 03/18/2024 by Plotnikov, Karlynn GAILS, MD.  Called Nurse Triage reporting Lymphadenopathy.  Symptoms began today.  Interventions attempted: Nothing.  Symptoms are: sudden large swelling to left side of face between jaw and ear (size of lime and now down to a golf ball) gradually improving.  Triage Disposition: See HCP Within 4 Hours (Or PCP Triage)  Patient/caregiver understands and will follow disposition?: Yes        Copied from CRM #[id number]. Topic: Clinical - Red Word Triage >> May 28, 2024  3:32 PM Lauren C wrote: Red Word that prompted transfer to Nurse Triage: Swelling in gland in neck starting this afternoon. Reason for Disposition  SEVERE face swelling (e.g., entire face)  Answer Assessment - Initial Assessment Questions He states his only medication change was that he started Ozempic  about 2 months ago. He denies eating any foods he is allergic to.  1. LOCATION: Where is the swollen node located? Is the matching node on the other side of the body also swollen?      Left side of face between ear and jaw.  2. SIZE: How big is the node? (e.g., inches or centimeters; or compared to common objects such as pea, bean, marble, golf ball)      He states its about the size of a golf ball or lime. He states it seems to be going down and is not as hard.  3. ONSET: When did the swelling start?      He states it was not present last night, he states it started this afternoon.  4. NECK NODES: Is there a sore throat, runny nose or other symptoms of a cold?      No runny nose, congestion, sore throat. He states his ears itch.  5. GROIN OR ARMPIT NODES: Is there a sore, scratch, cut or painful red area on that arm or leg?      N/A.  6. FEVER: Do you have a fever? If Yes, ask: What is it, how was it measured, and when did it start?      No.  7. CAUSE: What do you think is  causing the swollen lymph nodes?     He states he was chewing/eating and he noticed his jaw felt tight and the swelling popped up suddenly.  8. OTHER SYMPTOMS: Do you have any other symptoms? (e.g., node is tender to touch, skin redness over node, weight changes)     He states there is no pain to the swelling, difficulty breathing, difficulty swallowing, other facial swelling, tongue swelling, chest pain, arm pain. Patient states he wears dentures, no chipped/cracked/broken teeth or gum pain.  9. PREGNANCY: Is there any chance you are pregnant? When was your last menstrual period?     N/A.  Protocols used: Lymph Nodes - Swollen-A-AH, Face Swelling-A-AH

## 2024-05-28 NOTE — Discharge Instructions (Addendum)
 Continue to monitor your symptoms closely and if they worsen in any way please go to the ER ASAP for evaluation.  You can try sucking on sour candies as well.  Please follow-up with your PCP in 1 to 2 days for recheck.

## 2024-05-29 NOTE — Telephone Encounter (Signed)
 Copied from CRM #[id number]. Topic: General - Other >> May 29, 2024 10:06 AM [name] [name] wrote: Reason for CRM: Patient called in to let CMA know that he went to [facility] and he went to report that the gland has reduced in side,and he has been having some discharge out of the gland. UC did suggest  that he have an US  done to be on the safe side. He is requesting a phone call to discuss.

## 2024-06-02 NOTE — Telephone Encounter (Signed)
 Noted.  We should check it at his next office visit.  Thanks

## 2024-06-09 ENCOUNTER — Telehealth: Payer: Self-pay | Admitting: Neurosurgery

## 2024-06-09 ENCOUNTER — Telehealth: Payer: Self-pay

## 2024-06-09 NOTE — Telephone Encounter (Signed)
 Patient called to let our office know that today is day 21 of his goal of 180 for his blood sugar readings. He is scheduled with his PCP for his lab work tomorrow to check his glucose levels.

## 2024-06-09 NOTE — Telephone Encounter (Signed)
 Copied from CRM #[id number]. Topic: Clinical - Medical Advice >> Jun 09, 2024 11:24 AM Harlene ORN wrote: Reason for RMF:Ejupzwu wants to know if he will get his Fructosamine will bne done at his lab appointment tomorrow?

## 2024-06-10 ENCOUNTER — Other Ambulatory Visit (HOSPITAL_COMMUNITY)
Admission: RE | Admit: 2024-06-10 | Discharge: 2024-06-10 | Disposition: A | Discharge status: Home / Self Care | Source: Ambulatory Visit | Attending: Internal Medicine | Admitting: Internal Medicine

## 2024-06-10 ENCOUNTER — Other Ambulatory Visit (INDEPENDENT_AMBULATORY_CARE_PROVIDER_SITE_OTHER)

## 2024-06-10 ENCOUNTER — Other Ambulatory Visit

## 2024-06-10 DIAGNOSIS — E118 Type 2 diabetes mellitus with unspecified complications: Secondary | ICD-10-CM

## 2024-06-10 LAB — COMPREHENSIVE METABOLIC PANEL WITH GFR
ALT: 17 U/L (ref 0–53)
ALT: 15 U/L (ref 0–44)
AST: 11 U/L (ref 0–37)
AST: 17 U/L (ref 15–41)
Albumin: 4.1 g/dL (ref 3.5–5.2)
Albumin: 4.1 g/dL (ref 3.5–5.0)
Alkaline Phosphatase: 94 U/L (ref 39–117)
Alkaline Phosphatase: 103 U/L (ref 38–126)
Anion gap: 13 (ref 5–15)
BUN: 13 mg/dL (ref 6–23)
BUN: 12 mg/dL (ref 8–23)
CO2: 28 meq/L (ref 19–32)
CO2: 24 mmol/L (ref 22–32)
Calcium: 9.1 mg/dL (ref 8.4–10.5)
Calcium: 9.7 mg/dL (ref 8.9–10.3)
Chloride: 103 meq/L (ref 96–112)
Chloride: 104 mmol/L (ref 98–111)
Creatinine, Ser: 0.78 mg/dL (ref 0.40–1.50)
Creatinine, Ser: 0.76 mg/dL (ref 0.61–1.24)
GFR, Estimated: >60 mL/min (ref >60)
GFR: 90.76 mL/min (ref >60.00)
Glucose, Bld: 151 mg/dL — ABNORMAL HIGH (ref 70–99)
Glucose, Bld: 140 mg/dL — ABNORMAL HIGH (ref 70–99)
Potassium: 3.3 meq/L — ABNORMAL LOW (ref 3.5–5.1)
Potassium: 3.2 mmol/L — ABNORMAL LOW (ref 3.5–5.1)
Sodium: 142 meq/L (ref 135–145)
Sodium: 141 mmol/L (ref 135–145)
Total Bilirubin: 0.5 mg/dL (ref 0.2–1.2)
Total Bilirubin: 0.5 mg/dL (ref 0.0–1.2)
Total Protein: 6.6 g/dL (ref 6.0–8.3)
Total Protein: 6.4 g/dL — ABNORMAL LOW (ref 6.5–8.1)

## 2024-06-10 LAB — MICROALBUMIN / CREATININE URINE RATIO
Creatinine,U: 108.1 mg/dL
Microalb Creat Ratio: 19.4 mg/g (ref 0.0–30.0)
Microalb, Ur: 2.1 mg/dL — ABNORMAL HIGH (ref 0.0–1.9)

## 2024-06-10 LAB — HEMOGLOBIN A1C: Hgb A1c MFr Bld: 8.1 % — ABNORMAL HIGH (ref 4.6–6.5)

## 2024-06-11 ENCOUNTER — Other Ambulatory Visit: Payer: Self-pay | Admitting: Urology

## 2024-06-11 DIAGNOSIS — R972 Elevated prostate specific antigen [PSA]: Secondary | ICD-10-CM

## 2024-06-11 LAB — HEMOGLOBIN A1C
Hgb A1c MFr Bld: 7.8 % — ABNORMAL HIGH (ref 4.8–5.6)
Mean Plasma Glucose: 177 mg/dL

## 2024-06-12 NOTE — Telephone Encounter (Signed)
 Fructosamine test is still in process

## 2024-06-14 ENCOUNTER — Ambulatory Visit: Payer: Self-pay | Admitting: Internal Medicine

## 2024-06-16 NOTE — Telephone Encounter (Signed)
 Fructosamine test is still in process

## 2024-06-17 NOTE — Telephone Encounter (Signed)
 Fructosamine test is still in process

## 2024-06-18 ENCOUNTER — Ambulatory Visit: Admitting: Internal Medicine

## 2024-06-18 ENCOUNTER — Encounter: Payer: Self-pay | Admitting: Internal Medicine

## 2024-06-18 ENCOUNTER — Encounter: Payer: Self-pay | Admitting: Neurosurgery

## 2024-06-18 VITALS — BP 146/77 | HR 71 | Temp 97.8°F | Ht 66.0 in | Wt 173.0 lb

## 2024-06-18 DIAGNOSIS — M5000 Cervical disc disorder with myelopathy, unspecified cervical region: Secondary | ICD-10-CM

## 2024-06-18 DIAGNOSIS — C679 Malignant neoplasm of bladder, unspecified: Secondary | ICD-10-CM

## 2024-06-18 DIAGNOSIS — Z7985 Long-term (current) use of injectable non-insulin antidiabetic drugs: Secondary | ICD-10-CM | POA: Diagnosis not present

## 2024-06-18 DIAGNOSIS — E118 Type 2 diabetes mellitus with unspecified complications: Secondary | ICD-10-CM

## 2024-06-18 DIAGNOSIS — E1129 Type 2 diabetes mellitus with other diabetic kidney complication: Secondary | ICD-10-CM

## 2024-06-18 DIAGNOSIS — R809 Proteinuria, unspecified: Secondary | ICD-10-CM | POA: Diagnosis not present

## 2024-06-18 DIAGNOSIS — R142 Eructation: Secondary | ICD-10-CM

## 2024-06-18 DIAGNOSIS — E876 Hypokalemia: Secondary | ICD-10-CM

## 2024-06-18 LAB — FRUCTOSAMINE

## 2024-06-18 MED ORDER — POTASSIUM CHLORIDE CRYS ER 20 MEQ PO TBCR: 20.0000 meq | EXTENDED_RELEASE_TABLET | Freq: Every day | ORAL | 3 refills | Status: AC

## 2024-06-18 MED ORDER — SEMAGLUTIDE (2 MG/DOSE) 8 MG/3ML ~~LOC~~ SOPN: 2.0000 mg | PEN_INJECTOR | SUBCUTANEOUS | 3 refills | Status: DC

## 2024-06-18 MED ORDER — CONTOUR NEXT TEST VI STRP: 1.0000 | ORAL_STRIP | Freq: Every day | 12 refills | Status: AC

## 2024-06-18 NOTE — Assessment & Plan Note (Signed)
 H/o BCG treatments

## 2024-06-18 NOTE — Assessment & Plan Note (Signed)
 Use probiotic Align one a day as needed and over the counter Pepcid for burping

## 2024-06-18 NOTE — Patient Instructions (Addendum)
 Use probiotic Align one a day as needed and over the counter Pepcid for burping

## 2024-06-18 NOTE — Assessment & Plan Note (Signed)
 Worse Re-start KCl

## 2024-06-18 NOTE — Progress Notes (Signed)
 Subjective:  Patient ID: [name] [name], male    DOB: 1956/06/15  Age: 68 y.o. MRN: [id number]  CC: Follow-up (47mo; want to discuss UC visit for swollen parotid gland; and if there is a continued need for tadalafil )   HPI [name] [name] presents for HTN, DM, cervical radiculopathy CBG 104-202   Outpatient Medications Prior to Visit  Medication Sig Dispense Refill   amLODipine  (NORVASC ) 5 MG tablet Take 1 tablet (5 mg total) by mouth daily. 90 tablet 3   Ascorbic Acid (VITAMIN C) 1000 MG tablet Take 1,000 mg by mouth once a week.     blood glucose meter kit and supplies KIT Use to test blood sugar once a day. DX: E11.9 1 each 0   Blood Glucose Monitoring Suppl (CONTOUR NEXT MONITOR) w/Device KIT 1 Device by Does not apply route daily. 1 kit 0   cholecalciferol (VITAMIN D ) 1000 UNITS tablet Take 1,000 Units by mouth daily.     clobetasol  (TEMOVATE ) 0.05 % external solution Apply 1 Application topically 2 (two) times daily as needed. Itchy scalp 50 mL 3   cyclobenzaprine  (FLEXERIL ) 5 MG tablet TAKE 1 TABLET BY MOUTH THREE TIMES A DAY AS NEEDED FOR MUSCLE SPASMS 90 tablet 1   doxycycline  (VIBRA -TABS) 100 MG tablet Take 1 tablet (100 mg total) by mouth 2 (two) times daily. 14 tablet 0   DULoxetine  (CYMBALTA ) 30 MG capsule TAKE 1 CAPSULE (30 MG TOTAL) BY MOUTH AT BEDTIME. (Patient taking differently: Take 30 mg by mouth as needed.) 90 capsule 2   glimepiride  (AMARYL ) 4 MG tablet TAKE 1 TABLET (4 MG TOTAL) BY MOUTH IN THE MORNING AND AT BEDTIME 180 tablet 3   glucose blood (CONTOUR TEST) test strip USE TO CHECK BLOOD SUGAR DAILY AND AS NEEDED. 100 strip 5   HYDROcodone -acetaminophen  (NORCO) 10-325 MG tablet Take 1 tablet by mouth every 6 (six) hours as needed for severe pain (pain score 7-10). 40 tablet 0   ibuprofen  (ADVIL ) 800 MG tablet TAKE 1 TABLET (800 MG TOTAL) BY MOUTH DAILY AS NEEDED FOR MODERATE PAIN. 30 tablet 0   Lancets MISC Use to check blood sugar once per day. 100 each 3    metFORMIN  (GLUCOPHAGE ) 500 MG tablet Take 1 tablet (500 mg total) by mouth daily with breakfast. (Patient taking differently: Take 500 mg by mouth daily with breakfast. As needed) 90 tablet 3   mupirocin  ointment (BACTROBAN ) 2 % On leg wound w/dressing change qd or bid 30 g 0   olmesartan  (BENICAR ) 40 MG tablet Take 1 tablet (40 mg total) by mouth daily. 90 tablet 3   tolterodine  (DETROL ) 2 MG tablet Take 1 tablet (2 mg total) by mouth 2 (two) times daily. 60 tablet 5   glucose blood (CONTOUR NEXT TEST) test strip 1 each by Other route daily. Use to check blood sugars twice a day. 100 strip 12   potassium chloride  SA (KLOR-CON ) 20 MEQ tablet Take 1 tablet (20 mEq total) by mouth daily. (Patient taking differently: Take 20 mEq by mouth as needed.) 90 tablet 11   Semaglutide , 1 MG/DOSE, (OZEMPIC , 1 MG/DOSE,) 2 MG/1.5ML SOPN Inject 1 mg into the skin once a week. Use 1 mg sq weekly. 6 mL 3   tadalafil  (CIALIS ) 20 MG tablet Take 1 tablet (20 mg total) by mouth every three (3) days as needed for erectile dysfunction. (Patient not taking: Reported on 06/18/2024) 10 tablet 5   Facility-Administered Medications Prior to Visit  Medication Dose Route Frequency Provider  Last Rate Last Admin   gemcitabine  (GEMZAR ) chemo syringe for bladder instillation 2,000 mg  2,000 mg Bladder Instillation Once Wrenn, John, MD        ROS: Review of Systems  Constitutional:  Negative for appetite change, fatigue and unexpected weight change.  HENT:  Negative for congestion, nosebleeds, sneezing, sore throat and trouble swallowing.   Eyes:  Negative for itching and visual disturbance.  Respiratory:  Negative for cough.   Cardiovascular:  Negative for chest pain, palpitations and leg swelling.  Gastrointestinal:  Negative for abdominal distention, blood in stool, diarrhea and nausea.  Genitourinary:  Negative for frequency and hematuria.  Musculoskeletal:  Negative for back pain, gait problem, joint swelling and neck pain.   Skin:  Negative for rash.  Neurological:  Negative for dizziness, tremors, speech difficulty and weakness.  Psychiatric/Behavioral:  Negative for agitation, dysphoric mood and sleep disturbance. The patient is not nervous/anxious.     Objective:  BP (!) 146/77   Pulse 71   Temp 97.8 F (36.6 C)   Ht 5' 6 (1.676 m)   Wt 173 lb (78.5 kg)   SpO2 95%   BMI 27.92 kg/m   BP Readings from Last 3 Encounters:  06/18/24 (!) 146/77  05/28/24 (!) 156/91  05/19/24 (!) 183/95    Wt Readings from Last 3 Encounters:  06/18/24 173 lb (78.5 kg)  05/19/24 175 lb (79.4 kg)  05/15/24 180 lb (81.6 kg)    Physical Exam Constitutional:      General: He is not in acute distress.    Appearance: He is well-developed.     Comments: NAD  Eyes:     Conjunctiva/sclera: Conjunctivae normal.     Pupils: Pupils are equal, round, and reactive to light.  Neck:     Thyroid : No thyromegaly.     Vascular: No JVD.  Cardiovascular:     Rate and Rhythm: Normal rate and regular rhythm.     Heart sounds: Normal heart sounds. No murmur heard.    No friction rub. No gallop.  Pulmonary:     Effort: Pulmonary effort is normal. No respiratory distress.     Breath sounds: Normal breath sounds. No wheezing or rales.  Chest:     Chest wall: No tenderness.  Abdominal:     General: Bowel sounds are normal. There is no distension.     Palpations: Abdomen is soft. There is no mass.     Tenderness: There is no abdominal tenderness. There is no guarding or rebound.  Musculoskeletal:        General: No tenderness. Normal range of motion.     Cervical back: Normal range of motion.  Lymphadenopathy:     Cervical: No cervical adenopathy.  Skin:    General: Skin is warm and dry.     Findings: No rash.  Neurological:     Mental Status: He is alert and oriented to person, place, and time.     Cranial Nerves: No cranial nerve deficit.     Motor: Weakness present. No abnormal muscle tone.     Coordination:  Coordination normal.     Gait: Gait normal.     Deep Tendon Reflexes: Reflexes are normal and symmetric.  Psychiatric:        Behavior: Behavior normal.        Thought Content: Thought content normal.        Judgment: Judgment normal.   R hand w/weakness  Lab Results  Component Value Date   WBC 7.9  01/21/2024   HGB 16.6 01/21/2024   HCT 48.0 01/21/2024   PLT 166 01/21/2024   GLUCOSE 140 (H) 06/10/2024   CHOL 153 03/06/2021   TRIG 170.0 (H) 03/06/2021   HDL 33.50 (L) 03/06/2021   LDLCALC 86 03/06/2021   ALT 15 06/10/2024   AST 17 06/10/2024   NA 141 06/10/2024   K 3.2 (L) 06/10/2024   CL 104 06/10/2024   CREATININE 0.76 06/10/2024   BUN 12 06/10/2024   CO2 24 06/10/2024   TSH 0.64 03/06/2021   PSA 3.87 03/06/2021   INR 1.1 01/21/2024   HGBA1C 7.8 (H) 06/10/2024   MICROALBUR 2.1 (H) 06/10/2024    No results found.  Assessment & Plan:   Problem List Items Addressed This Visit     Bladder cancer (HCC)   H/o BCG treatments      Burping   Use probiotic Align one a day as needed and over the counter Pepcid for burping      CERVICAL RADICULOPATHY   F/u w/Dr Claudene (NS) Trying to improve his A1c prior to surgery      DM type 2, controlled, with complication (HCC) - Primary    Monitor A1c Ozempic  - increased to 2 mg/d. Use probiotic Align one a day as needed and over the counter Pepcid for burping      Relevant Medications   Semaglutide , 2 MG/DOSE, 8 MG/3ML SOPN   Other Relevant Orders   Comprehensive metabolic panel with GFR   Hemoglobin A1c   Fructosamine   Hypokalemia   Worse Re-start KCl      Relevant Orders   Comprehensive metabolic panel with GFR   Other Visit Diagnoses       Controlled diabetes mellitus type 2 with complications (HCC)       Relevant Medications   Semaglutide , 2 MG/DOSE, 8 MG/3ML SOPN   glucose blood (CONTOUR NEXT TEST) test strip         Meds ordered this encounter  Medications   Semaglutide , 2 MG/DOSE, 8 MG/3ML SOPN     Sig: Inject 2 mg as directed once a week.    Dispense:  3 mL    Refill:  3   potassium chloride  SA (KLOR-CON  M) 20 MEQ tablet    Sig: Take 1 tablet (20 mEq total) by mouth daily.    Dispense:  90 tablet    Refill:  3   glucose blood (CONTOUR NEXT TEST) test strip    Sig: 1 each by Other route daily. Use to check blood sugars twice a day.    Dispense:  100 strip    Refill:  12      Follow-up: Return in about 3 months (around 09/17/2024) for a follow-up visit.  Marolyn Noel, MD

## 2024-06-18 NOTE — Assessment & Plan Note (Signed)
 F/u w/Dr Claudene (NS) Trying to improve his A1c prior to surgery

## 2024-06-18 NOTE — Assessment & Plan Note (Addendum)
  Monitor A1c Ozempic  - increased to 2 mg/d. Use probiotic Align one a day as needed and over the counter Pepcid for burping

## 2024-06-19 NOTE — Telephone Encounter (Signed)
 Dr Claudene,  Your original message on 04/23/24 said you wanted him to have an A1c of 7.5 or less or a fructosamine result of less than 300. His A1c on 03/17/24 was 9; on 06/10/24 it was 7.8.  The fructosamine test was done on 06/10/24. However, the result says Test not performed. Deterioration occurred during specimen handling.  He called our office on 06/09/24 and stated: today is day 21 of his goal of 180 for his blood sugar readings  His A1c didn't change much between June and August. Should he go back for a repeat fructosamine test ASAP? Or how would you like to proceed?

## 2024-06-22 ENCOUNTER — Other Ambulatory Visit: Payer: Self-pay

## 2024-06-22 ENCOUNTER — Telehealth: Payer: Self-pay

## 2024-06-22 DIAGNOSIS — Z01818 Encounter for other preprocedural examination: Secondary | ICD-10-CM

## 2024-06-22 DIAGNOSIS — G5631 Lesion of radial nerve, right upper limb: Secondary | ICD-10-CM

## 2024-06-22 DIAGNOSIS — G5611 Other lesions of median nerve, right upper limb: Secondary | ICD-10-CM

## 2024-06-22 NOTE — Telephone Encounter (Signed)
 Planned surgery: Right sided posterior interosseous nerve (PIN) decompression, possible supinator to posterior interosseous nerve (PIN) nerve transfer, possible flexor carpi radialis (FCR) to posterior interosseous nerve (PIN) nerve transfer    Surgery date: 07/28/2024 at [facility] (Medical Mall: [address], [place], [place] [address]) - you will find out your arrival time the business day before your surgery.   Pre-op appointment at [facility] Pre-admit Testing: you will receive a call with a date/time for this appointment. If you are scheduled for an in person appointment, Pre-admit Testing is located on the first floor of the [facility] building, [address], [address]. During this appointment, they will advise you which medications you can take the morning of surgery, and which medications you will need to hold for surgery. Labs (such as blood work, EKG) may be done at your pre-op appointment. You are not required to fast for these labs. Should you need to change your pre-op appointment, please call Pre-admit testing at [phone].     Diabetes/heart failure/kidney disease/weight loss medications that require an extended hold: Per anesthesia guidelines (due to the increased risk of aspiration caused by delayed gastric emptying):   Metformin : hold for 2 days prior to surgery Semaglutide  injections: hold for 7 days prior to surgery     How to contact us :  If you have any questions/concerns before or after surgery, you can reach us  at [phone], or you can send a mychart message. We can be reached by phone or mychart 8am-4pm, Monday-Friday.  *Please note: Calls after 4pm are forwarded to a third party answering service. Mychart messages are not routinely monitored during evenings, weekends, and holidays. Please call our office to contact the answering service for urgent concerns during non-business hours.     If you have  FMLA/disability paperwork, please drop it off or fax it to [phone]   Appointments/FMLA & disability paperwork: [employer] Registered Nurse/Surgery scheduler: Kieanna Rollo, RN Certified Medical Assistants: Don, CMA, Elenor, CMA, & Damien, CMA Physician Assistants: Lyle Decamp, PA-C, Edsel Goods, PA-C & Glade Boys, PA-C Surgeons: Penne Sharps, MD & Reeves Daisy, MD   [facility] PREADMIT TESTING VISIT and SURGERY INFORMATION SHEET   Now that surgery has been scheduled you can anticipate several phone calls from [facility] services. A pharmacy technician will call you to verify your current list of medications taken at home.               The Pre-Service Center will call to verify your insurance information and to give you billing estimates and information.             The Preadmit Testing Office will be calling to schedule a visit to obtain information for the anesthesia team and provide instructions on preparation for surgery.  What can you expect for the Preadmit Testing Visit: Appointments may be scheduled in-person or by telephone.  If a telephone visit is scheduled, you may be asked to come into the office to have lab tests or other studies performed.   This visit will not be completed any greater than 14 days prior to your surgery.  If your surgery has been scheduled for a future date, please do not be alarmed if we have not contacted you to schedule an appointment more than a month prior to the surgery date.    Please be prepared to provide the following information during this appointment:            -  Personal medical history                                               -Medication and allergy list            -Any history of problems with anesthesia              -Recent lab work or diagnostic studies            -Please notify us  of any needs we should be aware of to provide the best care possible           -You will be provided with  instructions on how to prepare for your surgery.    On The Day of Surgery:  You must have a driver to take you home after surgery, you will be asked not to drive for 24 hours following surgery.  Taxi, Gisele and non-medical transport will not be acceptable means of transportation unless you have a responsible individual who will be traveling with you.  Visitors in the surgical area:   2 people will be able to visit you in your room once your preparation for surgery has been completed. During surgery, your visitors will be asked to wait in the Surgery Waiting Area.  It is not a requirement for them to stay, if they prefer to leave and come back.  Your visitor(s) will be given an update once the surgery has been completed.  No visitors are allowed in the initial recovery room to respect patient privacy and safety.  Once you are more awake and transfer to the secondary recovery area, or are transferred to an inpatient room, visitors will again be able to see you.  To respect and protect your privacy: We will ask on the day of surgery who your driver will be and what the contact number for that individual will be. We will ask if it is okay to share information with this individual, or if there is an alternative individual that we, or the surgeon, should contact to provide updates and information. If family or friends come to the surgical information desk requesting information about you, who you have not listed with us , no information will be given.   It may be helpful to designate someone as the main contact who will be responsible for updating your other friends and family.    PREADMIT TESTING OFFICE: [phone] SAME DAY SURGERY: [phone] We look forward to caring for you before and throughout the process of your surgery.

## 2024-07-10 ENCOUNTER — Ambulatory Visit
Admission: RE | Admit: 2024-07-10 | Discharge: 2024-07-10 | Disposition: A | Discharge status: Home / Self Care | Source: Ambulatory Visit | Attending: Urology | Admitting: Urology

## 2024-07-10 DIAGNOSIS — R972 Elevated prostate specific antigen [PSA]: Secondary | ICD-10-CM

## 2024-07-10 MED ORDER — GADOPICLENOL 0.5 MMOL/ML IV SOLN
8.0000 mL | Freq: Once | INTRAVENOUS | Status: AC | PRN
  Administered 2024-07-10: 8 mL via INTRAVENOUS

## 2024-07-17 DIAGNOSIS — Z8551 Personal history of malignant neoplasm of bladder: Secondary | ICD-10-CM | POA: Diagnosis not present

## 2024-07-17 DIAGNOSIS — R3915 Urgency of urination: Secondary | ICD-10-CM | POA: Diagnosis not present

## 2024-07-17 DIAGNOSIS — R3912 Poor urinary stream: Secondary | ICD-10-CM | POA: Diagnosis not present

## 2024-07-21 ENCOUNTER — Other Ambulatory Visit

## 2024-07-21 ENCOUNTER — Other Ambulatory Visit: Payer: Self-pay

## 2024-07-21 ENCOUNTER — Encounter
Admission: RE | Admit: 2024-07-21 | Discharge: 2024-07-21 | Disposition: A | Discharge status: Home / Self Care | Source: Ambulatory Visit | Attending: Neurosurgery | Admitting: Neurosurgery

## 2024-07-21 VITALS — Ht 66.0 in | Wt 180.0 lb

## 2024-07-21 DIAGNOSIS — I700 Atherosclerosis of aorta: Secondary | ICD-10-CM

## 2024-07-21 DIAGNOSIS — I10 Essential (primary) hypertension: Secondary | ICD-10-CM

## 2024-07-21 DIAGNOSIS — I2583 Coronary atherosclerosis due to lipid rich plaque: Secondary | ICD-10-CM

## 2024-07-21 DIAGNOSIS — R9431 Abnormal electrocardiogram [ECG] [EKG]: Secondary | ICD-10-CM

## 2024-07-21 DIAGNOSIS — E1129 Type 2 diabetes mellitus with other diabetic kidney complication: Secondary | ICD-10-CM

## 2024-07-21 NOTE — Patient Instructions (Addendum)
 Your procedure is scheduled on: Tuesday 07/28/24 Report to the Registration Desk on the 1st floor of the Medical Mall. To find out your arrival time, please call [phone] between 1PM - 3PM on: Monday 07/27/24 If your arrival time is 6:00 am, do not arrive before that time as the Medical Mall entrance doors do not open until 6:00 am.  REMEMBER: Instructions that are not followed completely may result in serious medical risk, up to and including death; or upon the discretion of your surgeon and anesthesiologist your surgery may need to be rescheduled.  Do not eat food after midnight the night before surgery.  No gum chewing or hard candies.  You may however, drink CLEAR liquids up to 2 hours before you are scheduled to arrive for your surgery. Do not drink anything within 2 hours of your scheduled arrival time.  Clear liquids include: - water   Do NOT drink anything that is not on this list.  **Type 1 and Type 2 diabetics should only drink water .**  One week prior to surgery: Stop Anti-inflammatories (NSAIDS) such as Advil , Aleve, Ibuprofen , Motrin , Naproxen, Naprosyn and Aspirin  based products such as Excedrin, Goody's Powder, BC Powder. You may use Tylenol  if needed.  Stop ANY OVER THE COUNTER supplements and vitamins today 07/21/24 until after surgery.  **Follow guidelines for insulin  and diabetes medications.** Skip your Ozempic  dose tomorrow 07/22/24. Hold Metformin  for 2 days with last dose Saturday night 07/25/24. These may be restarted at the direction of your physician after surgery.  Continue taking all of your other prescription medications up until the day of surgery. DO NOT TAKE CIALIS /TADALAFIL  FOR 3 DAYS BEFORE YOUR SURGERY.  ON THE DAY OF SURGERY ONLY TAKE THESE MEDICATIONS WITH SIPS OF WATER :  famotidine (PEPCID) 10 MG tablet  amLODipine  (NORVASC ) 5 MG tablet   No Alcohol for 24 hours before or after surgery.  No Smoking including e-cigarettes for 24 hours before  surgery.  No chewable tobacco products for at least 6 hours before surgery.  No nicotine patches on the day of surgery.  Do not use any recreational drugs for at least a week (preferably 2 weeks) before your surgery.  Please be advised that the combination of cocaine and anesthesia may have negative outcomes, up to and including death. If you test positive for cocaine, your surgery will be cancelled.  On the morning of surgery brush your teeth with toothpaste and water , you may rinse your mouth with mouthwash if you wish. Do not swallow any toothpaste or mouthwash.  Use CHG Soap or wipes as directed on instruction sheet.  Do not wear jewelry, or body piercings, make-up, hairpins, clips or nail polish.  For welded (permanent) jewelry: bracelets, anklets, waist bands, etc.  Please have this removed prior to surgery.  If it is not removed, there is a chance that hospital personnel will need to cut it off on the day of surgery.  Do not wear lotions, powders, deodorant, cologne or perfumes.   Do not shave body hair from the neck down 48 hours before surgery.  Contact lenses, hearing aids and dentures may not be worn into surgery.  Do not bring valuables to the hospital. [facility] is not responsible for any missing/lost belongings or valuables.   Notify your doctor if there is any change in your medical condition (cold, fever, infection).  Wear comfortable clothing (specific to your surgery type) to the hospital.  After surgery, you can help prevent lung complications by doing breathing exercises.  Take deep breaths and cough every 1-2 hours. Your doctor may order a device called an Incentive Spirometer to help you take deep breaths.  If you are being discharged the day of surgery, you will not be allowed to drive home. You will need a responsible individual to drive you home and stay with you for 24 hours after surgery.   If you are taking public transportation, you will need to  have a responsible individual with you.  Please call the Pre-admissions Testing Dept. at [phone] if you have any questions about these instructions.  Surgery Visitation Policy:  Patients having surgery or a procedure may have two visitors.  Children under the age of 71 must have an adult with them who is not the patient.   Merchandiser, retail to address health-related social needs:  [email or web address]                                                                                                             Preparing for Surgery with CHLORHEXIDINE  GLUCONATE (CHG) Soap  Chlorhexidine  Gluconate (CHG) Soap  o An antiseptic cleaner that kills germs and bonds with the skin to continue killing germs even after washing  o Used for showering the night before surgery and morning of surgery  Before surgery, you can play an important role by reducing the number of germs on your skin.  CHG (Chlorhexidine  gluconate) soap is an antiseptic cleanser which kills germs and bonds with the skin to continue killing germs even after washing.  Please do not use if you have an allergy to CHG or antibacterial soaps. If your skin becomes reddened/irritated stop using the CHG.  1. Shower the NIGHT BEFORE SURGERY with CHG soap.  2. If you choose to wash your hair, wash your hair first as usual with your normal shampoo.  3. After shampooing, rinse your hair and body thoroughly to remove the shampoo.  4. Use CHG as you would any other liquid soap. You can apply CHG directly to the skin and wash gently with a clean washcloth.  5. Apply the CHG soap to your body only from the neck down. Do not use on open wounds or open sores. Avoid contact with your eyes, ears, mouth, and genitals (private parts). Wash face and genitals (private parts) with your normal soap.  6. Wash thoroughly, paying special attention to the area where your surgery will be performed.  7. Thoroughly rinse your  body with warm water .  8. Do not shower/wash with your normal soap after using and rinsing off the CHG soap.  9. Do not use lotions, oils, etc., after showering with CHG.  10. Pat yourself dry with a clean towel.  11. Wear clean pajamas to bed the night before surgery.  12. Place clean sheets on your bed the night of your shower and do not sleep with pets.  13. Do not apply any deodorants/lotions/powders.  14. Please wear clean clothes to the hospital.  15. Remember to brush your teeth with your regular toothpaste.

## 2024-07-22 ENCOUNTER — Encounter
Admission: RE | Admit: 2024-07-22 | Discharge: 2024-07-22 | Disposition: A | Discharge status: Home / Self Care | Source: Ambulatory Visit | Attending: Neurosurgery | Admitting: Neurosurgery

## 2024-07-22 ENCOUNTER — Encounter: Payer: Self-pay | Admitting: Urgent Care

## 2024-07-22 DIAGNOSIS — I2583 Coronary atherosclerosis due to lipid rich plaque: Secondary | ICD-10-CM | POA: Diagnosis not present

## 2024-07-22 DIAGNOSIS — E1129 Type 2 diabetes mellitus with other diabetic kidney complication: Secondary | ICD-10-CM | POA: Insufficient documentation

## 2024-07-22 DIAGNOSIS — Z01818 Encounter for other preprocedural examination: Secondary | ICD-10-CM | POA: Diagnosis not present

## 2024-07-22 DIAGNOSIS — R9431 Abnormal electrocardiogram [ECG] [EKG]: Secondary | ICD-10-CM | POA: Insufficient documentation

## 2024-07-22 DIAGNOSIS — I10 Essential (primary) hypertension: Secondary | ICD-10-CM | POA: Diagnosis not present

## 2024-07-22 DIAGNOSIS — I700 Atherosclerosis of aorta: Secondary | ICD-10-CM | POA: Insufficient documentation

## 2024-07-22 LAB — BASIC METABOLIC PANEL WITH GFR
Anion gap: 10 (ref 5–15)
BUN: 15 mg/dL (ref 8–23)
CO2: 24 mmol/L (ref 22–32)
Calcium: 9 mg/dL (ref 8.9–10.3)
Chloride: 105 mmol/L (ref 98–111)
Creatinine, Ser: 0.91 mg/dL (ref 0.61–1.24)
GFR, Estimated: >60 mL/min (ref >60)
Glucose, Bld: 167 mg/dL — ABNORMAL HIGH (ref 70–99)
Potassium: 3.3 mmol/L — ABNORMAL LOW (ref 3.5–5.1)
Sodium: 139 mmol/L (ref 135–145)

## 2024-07-22 LAB — CBC
HCT: 45.8 % (ref 39.0–52.0)
Hemoglobin: 15.7 g/dL (ref 13.0–17.0)
MCH: 28.7 pg (ref 26.0–34.0)
MCHC: 34.3 g/dL (ref 30.0–36.0)
MCV: 83.7 fL (ref 80.0–100.0)
Platelets: 166 K/uL (ref 150–400)
RBC: 5.47 MIL/uL (ref 4.22–5.81)
RDW: 14 % (ref 11.5–15.5)
WBC: 9 K/uL (ref 4.0–10.5)
nRBC: 0 % (ref 0.0–0.2)

## 2024-07-22 LAB — HEMOGLOBIN A1C
Hgb A1c MFr Bld: 6.2 % — ABNORMAL HIGH (ref 4.8–5.6)
Mean Plasma Glucose: 131.24 mg/dL

## 2024-07-28 ENCOUNTER — Other Ambulatory Visit: Payer: Self-pay

## 2024-07-28 ENCOUNTER — Ambulatory Visit

## 2024-07-28 ENCOUNTER — Ambulatory Visit
Admission: RE | Admit: 2024-07-28 | Discharge: 2024-07-28 | Disposition: A | Discharge status: Home / Self Care | Attending: Neurosurgery | Admitting: Neurosurgery

## 2024-07-28 ENCOUNTER — Encounter
Admission: RE | Disposition: A | Discharge status: Home / Self Care | Payer: Self-pay | Source: Home / Self Care | Attending: Neurosurgery

## 2024-07-28 ENCOUNTER — Encounter: Payer: Self-pay | Admitting: Neurosurgery

## 2024-07-28 DIAGNOSIS — G5631 Lesion of radial nerve, right upper limb: Secondary | ICD-10-CM | POA: Diagnosis not present

## 2024-07-28 DIAGNOSIS — G5611 Other lesions of median nerve, right upper limb: Secondary | ICD-10-CM | POA: Diagnosis not present

## 2024-07-28 DIAGNOSIS — K219 Gastro-esophageal reflux disease without esophagitis: Secondary | ICD-10-CM | POA: Diagnosis not present

## 2024-07-28 DIAGNOSIS — F17210 Nicotine dependence, cigarettes, uncomplicated: Secondary | ICD-10-CM | POA: Diagnosis not present

## 2024-07-28 DIAGNOSIS — I2510 Atherosclerotic heart disease of native coronary artery without angina pectoris: Secondary | ICD-10-CM | POA: Insufficient documentation

## 2024-07-28 DIAGNOSIS — E785 Hyperlipidemia, unspecified: Secondary | ICD-10-CM | POA: Diagnosis not present

## 2024-07-28 DIAGNOSIS — Z01818 Encounter for other preprocedural examination: Secondary | ICD-10-CM

## 2024-07-28 DIAGNOSIS — I10 Essential (primary) hypertension: Secondary | ICD-10-CM | POA: Diagnosis not present

## 2024-07-28 DIAGNOSIS — Z7984 Long term (current) use of oral hypoglycemic drugs: Secondary | ICD-10-CM | POA: Insufficient documentation

## 2024-07-28 DIAGNOSIS — E1129 Type 2 diabetes mellitus with other diabetic kidney complication: Secondary | ICD-10-CM

## 2024-07-28 LAB — GLUCOSE, CAPILLARY
Glucose-Capillary: 187 mg/dL — ABNORMAL HIGH (ref 70–99)
Glucose-Capillary: 181 mg/dL — ABNORMAL HIGH (ref 70–99)

## 2024-07-28 SURGERY — DECOMPRESSION, NERVE, POSTERIOR INTEROSSEOUS
Anesthesia: General | Laterality: Right

## 2024-07-28 MED ORDER — DROPERIDOL 2.5 MG/ML IJ SOLN: 0.6250 mg | Freq: Once | INTRAMUSCULAR | Status: DC | PRN

## 2024-07-28 MED ORDER — PHENYLEPHRINE HCL-NACL 20-0.9 MG/250ML-% IV SOLN
INTRAVENOUS | Status: DC | PRN
  Administered 2024-07-28: 20 ug/min via INTRAVENOUS

## 2024-07-28 MED ORDER — FENTANYL CITRATE (PF) 100 MCG/2ML IJ SOLN
INTRAMUSCULAR | Status: AC
  Filled 2024-07-28: qty 2

## 2024-07-28 MED ORDER — CHLORHEXIDINE GLUCONATE 0.12 % MT SOLN
OROMUCOSAL | Status: AC
Start: 2024-07-28 — End: 2024-07-28
  Filled 2024-07-28: qty 15

## 2024-07-28 MED ORDER — PHENYLEPHRINE 80 MCG/ML (10ML) SYRINGE FOR IV PUSH (FOR BLOOD PRESSURE SUPPORT)
PREFILLED_SYRINGE | INTRAVENOUS | Status: DC | PRN
  Administered 2024-07-28 (×3): 80 ug via INTRAVENOUS

## 2024-07-28 MED ORDER — SUCCINYLCHOLINE CHLORIDE 200 MG/10ML IV SOSY
PREFILLED_SYRINGE | INTRAVENOUS | Status: AC
  Filled 2024-07-28: qty 10

## 2024-07-28 MED ORDER — CEFAZOLIN SODIUM-DEXTROSE 2-4 GM/100ML-% IV SOLN
2.0000 g | Freq: Once | INTRAVENOUS | Status: AC
  Administered 2024-07-28: 2 g via INTRAVENOUS

## 2024-07-28 MED ORDER — OXYCODONE HCL 5 MG PO TABS
5.0000 mg | ORAL_TABLET | Freq: Once | ORAL | Status: AC | PRN
  Administered 2024-07-28: 5 mg via ORAL

## 2024-07-28 MED ORDER — DEXMEDETOMIDINE HCL IN NACL 80 MCG/20ML IV SOLN
INTRAVENOUS | Status: AC
  Filled 2024-07-28: qty 20

## 2024-07-28 MED ORDER — SENNA 8.6 MG PO TABS
1.0000 | ORAL_TABLET | Freq: Two times a day (BID) | ORAL | 0 refills | Status: AC | PRN
  Filled 2024-07-28: qty 14, 7d supply, fill #0

## 2024-07-28 MED ORDER — 0.9 % SODIUM CHLORIDE (POUR BTL) OPTIME
TOPICAL | Status: DC | PRN
  Administered 2024-07-28: 500 mL

## 2024-07-28 MED ORDER — MIDAZOLAM HCL 2 MG/2ML IJ SOLN
INTRAMUSCULAR | Status: AC
  Filled 2024-07-28: qty 2

## 2024-07-28 MED ORDER — LIDOCAINE HCL (PF) 2 % IJ SOLN
INTRAMUSCULAR | Status: DC | PRN
Start: 2024-07-28 — End: 2024-07-28
  Administered 2024-07-28: 100 mg via INTRADERMAL

## 2024-07-28 MED ORDER — CEFAZOLIN SODIUM-DEXTROSE 2-4 GM/100ML-% IV SOLN
INTRAVENOUS | Status: AC
  Filled 2024-07-28: qty 100

## 2024-07-28 MED ORDER — EPHEDRINE SULFATE-NACL 50-0.9 MG/10ML-% IV SOSY
PREFILLED_SYRINGE | INTRAVENOUS | Status: DC | PRN
  Administered 2024-07-28: 5 mg via INTRAVENOUS

## 2024-07-28 MED ORDER — ORAL CARE MOUTH RINSE: 15.0000 mL | Freq: Once | OROMUCOSAL | Status: AC

## 2024-07-28 MED ORDER — FENTANYL CITRATE (PF) 100 MCG/2ML IJ SOLN
INTRAMUSCULAR | Status: DC | PRN
  Administered 2024-07-28 (×2): 50 ug via INTRAVENOUS

## 2024-07-28 MED ORDER — LIDOCAINE HCL (PF) 2 % IJ SOLN
INTRAMUSCULAR | Status: AC
  Filled 2024-07-28: qty 5

## 2024-07-28 MED ORDER — PROPOFOL 1000 MG/100ML IV EMUL
INTRAVENOUS | Status: AC
  Filled 2024-07-28: qty 100

## 2024-07-28 MED ORDER — BUPIVACAINE-EPINEPHRINE 0.5% -1:200000 IJ SOLN
INTRAMUSCULAR | Status: DC | PRN
  Administered 2024-07-28: 10 mL

## 2024-07-28 MED ORDER — SUCCINYLCHOLINE CHLORIDE 200 MG/10ML IV SOSY
PREFILLED_SYRINGE | INTRAVENOUS | Status: DC | PRN
  Administered 2024-07-28: 120 mg via INTRAVENOUS

## 2024-07-28 MED ORDER — PHENYLEPHRINE HCL-NACL 20-0.9 MG/250ML-% IV SOLN
INTRAVENOUS | Status: AC
  Filled 2024-07-28: qty 250

## 2024-07-28 MED ORDER — ACETAMINOPHEN 10 MG/ML IV SOLN: 1000.0000 mg | Freq: Once | INTRAVENOUS | Status: DC | PRN

## 2024-07-28 MED ORDER — FENTANYL CITRATE (PF) 100 MCG/2ML IJ SOLN
25.0000 ug | INTRAMUSCULAR | Status: DC | PRN
  Administered 2024-07-28 (×4): 25 ug via INTRAVENOUS

## 2024-07-28 MED ORDER — MIDAZOLAM HCL 2 MG/2ML IJ SOLN
INTRAMUSCULAR | Status: DC | PRN
  Administered 2024-07-28: 2 mg via INTRAVENOUS

## 2024-07-28 MED ORDER — BUPIVACAINE-EPINEPHRINE (PF) 0.5% -1:200000 IJ SOLN
INTRAMUSCULAR | Status: AC
  Filled 2024-07-28: qty 30

## 2024-07-28 MED ORDER — PROPOFOL 10 MG/ML IV BOLUS
INTRAVENOUS | Status: AC
  Filled 2024-07-28: qty 20

## 2024-07-28 MED ORDER — CHLORHEXIDINE GLUCONATE 0.12 % MT SOLN
15.0000 mL | Freq: Once | OROMUCOSAL | Status: AC
  Administered 2024-07-28: 15 mL via OROMUCOSAL

## 2024-07-28 MED ORDER — SODIUM CHLORIDE 0.9 % IV SOLN: INTRAVENOUS | Status: DC

## 2024-07-28 MED ORDER — ACETAMINOPHEN 10 MG/ML IV SOLN
INTRAVENOUS | Status: DC | PRN
  Administered 2024-07-28: 1000 mg via INTRAVENOUS

## 2024-07-28 MED ORDER — ONDANSETRON HCL 4 MG/2ML IJ SOLN
INTRAMUSCULAR | Status: DC | PRN
  Administered 2024-07-28: 4 mg via INTRAVENOUS

## 2024-07-28 MED ORDER — OXYCODONE HCL 5 MG/5ML PO SOLN: 5.0000 mg | Freq: Once | ORAL | Status: AC | PRN

## 2024-07-28 MED ORDER — ONDANSETRON HCL 4 MG/2ML IJ SOLN
INTRAMUSCULAR | Status: AC
  Filled 2024-07-28: qty 2

## 2024-07-28 MED ORDER — OXYCODONE HCL 5 MG PO TABS
ORAL_TABLET | ORAL | Status: AC
  Filled 2024-07-28: qty 1

## 2024-07-28 MED ORDER — DEXAMETHASONE SOD PHOSPHATE PF 10 MG/ML IJ SOLN
INTRAMUSCULAR | Status: DC | PRN
  Administered 2024-07-28: 5 mg via INTRAVENOUS

## 2024-07-28 MED ORDER — ACETAMINOPHEN 10 MG/ML IV SOLN
INTRAVENOUS | Status: AC
  Filled 2024-07-28: qty 100

## 2024-07-28 MED ORDER — PROPOFOL 10 MG/ML IV BOLUS
INTRAVENOUS | Status: AC
  Filled 2024-07-28: qty 40

## 2024-07-28 MED ORDER — OXYCODONE HCL 5 MG PO TABS
5.0000 mg | ORAL_TABLET | Freq: Four times a day (QID) | ORAL | 0 refills | Status: AC | PRN
  Filled 2024-07-28: qty 12, 3d supply, fill #0

## 2024-07-28 MED ORDER — PROPOFOL 10 MG/ML IV BOLUS
INTRAVENOUS | Status: DC | PRN
  Administered 2024-07-28: 100 mg via INTRAVENOUS

## 2024-07-28 SURGICAL SUPPLY — 34 items
BNDG COHESIVE 4X5 TAN STRL LF (GAUZE/BANDAGES/DRESSINGS) ×1 IMPLANT
BNDG GAUZE DERMACEA FLUFF 4 (GAUZE/BANDAGES/DRESSINGS) ×1 IMPLANT
BRUSH SCRUB EZ 4% CHG (MISCELLANEOUS) ×1 IMPLANT
CHLORAPREP W/TINT 26 (MISCELLANEOUS) ×1 IMPLANT
DERMABOND ADVANCED .7 DNX12 (GAUZE/BANDAGES/DRESSINGS) ×1 IMPLANT
DRAPE SPINE LEICA/WILD 54X150 (DRAPES) ×1 IMPLANT
DRAPE SURG 17X11 SM STRL (DRAPES) ×2 IMPLANT
FEE INTRAOP CADWELL SUPPLY NCS (MISCELLANEOUS) IMPLANT
FEE INTRAOP MONITOR IMPULS NCS (MISCELLANEOUS) IMPLANT
FORCEPS JEWEL BIP 4-3/4 STR (INSTRUMENTS) ×1 IMPLANT
GAUZE SPONGE 4X4 12PLY STRL (GAUZE/BANDAGES/DRESSINGS) ×1 IMPLANT
GLOVE BIOGEL PI IND STRL 8 (GLOVE) ×2 IMPLANT
GLOVE SURG SYN 7.5 PF PI (GLOVE) ×1 IMPLANT
GOWN SRG XL LVL 3 NONREINFORCE (GOWNS) ×1 IMPLANT
GOWN STRL REUS W/ TWL LRG LVL3 (GOWN DISPOSABLE) ×1 IMPLANT
GOWN STRL REUS W/ TWL XL LVL3 (GOWN DISPOSABLE) ×1 IMPLANT
KIT TURNOVER KIT A (KITS) ×1 IMPLANT
MANIFOLD NEPTUNE II (INSTRUMENTS) ×1 IMPLANT
NDL HYPO 25X1 1.5 SAFETY (NEEDLE) ×1 IMPLANT
NEEDLE HYPO 25X1 1.5 SAFETY (NEEDLE) ×1 IMPLANT
NS IRRIG 500ML POUR BTL (IV SOLUTION) ×1 IMPLANT
PACK EXTREMITY ARMC (MISCELLANEOUS) ×1 IMPLANT
PENCIL SMOKE EVACUATOR (MISCELLANEOUS) ×1 IMPLANT
PROBE MONOPOLAR PRASS STIM NCS (MISCELLANEOUS) IMPLANT
SEALANT FIBRIN TSSL HEMOSTAT 2 (Miscellaneous) IMPLANT
STOCKINETTE IMPERVIOUS 9X36 MD (GAUZE/BANDAGES/DRESSINGS) ×1 IMPLANT
SURGIFLO W/THROMBIN 8M KIT (HEMOSTASIS) IMPLANT
SUT ETHILON 6 0 9-3 1X18 BLK (SUTURE) IMPLANT
SUT ETHILON 8 0 (SUTURE) ×1 IMPLANT
SUT STRATA 3-0 15 RB-1.5 (SUTURE) IMPLANT
SUT VIC AB 2-0 SH 27XBRD (SUTURE) IMPLANT
SUT VIC AB 3-0 SH 27X BRD (SUTURE) IMPLANT
TAPE TRANSPORE STRL 2 31045 (GAUZE/BANDAGES/DRESSINGS) IMPLANT
TRAP FLUID SMOKE EVACUATOR (MISCELLANEOUS) ×1 IMPLANT

## 2024-07-28 NOTE — Transfer of Care (Signed)
 Immediate Anesthesia Transfer of Care Note  Patient: [name] [name]  Procedure(s) Performed: DECOMPRESSION, NERVE, POSTERIOR INTEROSSEOUS (Right)  Patient Location: PACU  Anesthesia Type:General  Level of Consciousness: drowsy  Airway & Oxygen Therapy: Patient Spontanous Breathing and Patient connected to face mask oxygen  Post-op Assessment: Report given to RN and Post -op Vital signs reviewed and stable  Post vital signs: Reviewed and stable  Last Vitals:  Vitals Value Taken Time  BP 152/80 07/28/24 09:00  Temp 35.9 0900  Pulse 74 07/28/24 09:02  Resp 20 07/28/24 09:02  SpO2 100 % 07/28/24 09:02  Vitals shown include unfiled device data.  Last Pain:  Vitals:   07/28/24 0634  PainSc: 0-No pain         Complications: No notable events documented.

## 2024-07-28 NOTE — Anesthesia Procedure Notes (Signed)
 Procedure Name: Intubation Date/Time: 07/28/2024 7:30 AM  Performed by: Jackye Spanner, CRNAPre-anesthesia Checklist: Patient identified, Patient being monitored, Timeout performed, Emergency Drugs available and Suction available Patient Re-evaluated:Patient Re-evaluated prior to induction Oxygen Delivery Method: Circle system utilized Preoxygenation: Pre-oxygenation with 100% oxygen Induction Type: IV induction and Rapid sequence Laryngoscope Size: 3 and McGrath Grade View: Grade I Tube type: Oral Tube size: 7.5 mm Number of attempts: 1 Airway Equipment and Method: Stylet Placement Confirmation: ETT inserted through vocal cords under direct vision, positive ETCO2 and breath sounds checked- equal and bilateral Secured at: 21 cm Tube secured with: Tape Dental Injury: Teeth and Oropharynx as per pre-operative assessment  Comments: Grade 1 view with McGrath X3 blade, no complications noted. Smooth, atraumatic intubation.

## 2024-07-28 NOTE — Anesthesia Preprocedure Evaluation (Signed)
 Anesthesia Evaluation  Patient identified by MRN, date of birth, ID band Patient awake    Reviewed: Allergy & Precautions, H&P , NPO status , Patient's Chart, lab work & pertinent test results, reviewed documented beta blocker date and time   History of Anesthesia Complications (+) DIFFICULT AIRWAY and history of anesthetic complications  Airway Mallampati: II  TM Distance: >3 FB Neck ROM: full    Dental  (+) Teeth Intact   Pulmonary neg pulmonary ROS, Current Smoker and Patient abstained from smoking.   Pulmonary exam normal        Cardiovascular Exercise Tolerance: Poor hypertension, On Medications (-) angina + CAD  (-) CHF, (-) Orthopnea, (-) PND, (-) DOE and (-) DVT Normal cardiovascular exam Rhythm:regular Rate:Normal     Neuro/Psych  Headaches PSYCHIATRIC DISORDERS       Neuromuscular disease    GI/Hepatic Neg liver ROS,GERD  Medicated,,  Endo/Other  negative endocrine ROSdiabetes, Well Controlled, Type 2, Oral Hypoglycemic Agents    Renal/GU Renal disease  negative genitourinary   Musculoskeletal   Abdominal   Peds  Hematology negative hematology ROS (+)   Anesthesia Other Findings Past Medical History: No date: Benign localized prostatic hyperplasia with lower urinary  tract symptoms (LUTS)     Comment:  urologist--- dr watt No date: Bladder cancer [facility])     Comment:  urologist---- dr watt---  s/p TURBT 11/2020; 03/ 2022 No date: Coronary artery disease     Comment:  mild No date: DDD (degenerative disc disease), cervical     Comment:  From 1980s No date: Diabetes mellitus type II     Comment:  followed by pcp--   (01-05-2021  per pt checks blood               sugar three times per week,  fasting sugar --- 130--140_ 07/28/2012: Difficult intubation     Comment:  partial nephrectomy done @[facility]  07-28-2012  found to have              anterior larynx , diffcult intubation, referred to                anesthesia record in epic  No date: Dyslipidemia No date: ED (erectile dysfunction) No date: Elevated PSA No date: GERD (gastroesophageal reflux disease) No date: History of adenomatous polyp of colon No date: History of kidney stones 2008: History of prostatitis urologist--- dr watt: History of renal carcinoma     Comment:  07-28-2012 for renal neoplasm  s/p right partal               nephrectomy  No date: Hypertension     Comment:  followed by pcp No date: Neuromuscular disorder (HCC) No date: Osteoarthritis     Comment:  Back No date: Renal carcinoma, right [facility]) Past Surgical History: No date: CATARACT EXTRACTION 03/03/2013: COLONOSCOPY; N/A     Comment:  Procedure: COLONOSCOPY;  Surgeon: Norleen LOISE Kiang, MD;                [facility]: [facility];  Service: Endoscopy;  Laterality:              N/A; 09/29/2019: COLONOSCOPY WITH PROPOFOL ; N/A     Comment:  Procedure: COLONOSCOPY WITH PROPOFOL ;  Surgeon: Kiang Norleen LOISE, MD;  [facility]: [facility];  Service: Endoscopy;              Laterality: N/A; 12/09/2020: CYSTOSCOPY WITH  BIOPSY; N/A     Comment:  Procedure: CYSTOSCOPY WITH TURBT, BIOPSY AND               FULGURATION;  Surgeon: Watt Rush, MD;  [facility]: [facility];  Service: Urology;  Laterality: N/A;  REQUESTING 45               MINS 01-10-2015;  03-03-2012  @[facility] : EXTRACORPOREAL SHOCK WAVE LITHOTRIPSY 01/21/2024: IR ANGIOGRAM PELVIS SELECTIVE OR SUPRASELECTIVE 01/21/2024: IR ANGIOGRAM SELECTIVE EACH ADDITIONAL VESSEL 01/21/2024: IR ANGIOGRAM SELECTIVE EACH ADDITIONAL VESSEL 01/21/2024: IR ANGIOGRAM SELECTIVE EACH ADDITIONAL VESSEL 01/21/2024: IR EMBO TUMOR ORGAN ISCHEMIA INFARCT INC GUIDE ROADMAPPING 12/23/2023: IR RADIOLOGIST EVAL & MGMT 01/21/2024: IR US  GUIDE VASC ACCESS LEFT 01/21/2024: IR US  GUIDE VASC ACCESS LEFT 01/21/2024: IR US  GUIDE VASC ACCESS RIGHT 09/29/2019: POLYPECTOMY     Comment:  Procedure: POLYPECTOMY;  Surgeon: Abran Rush SAILOR, MD;                 [facility];  Service: Endoscopy;; 07-28-2012   DR RENDA  @[facility] : ROBOT ASSISTED LAPAROSCOPIC PARTIAL  NEPHRECTOMY 1965: TONSILLECTOMY 11/27/2023: TRANSURETHRAL RESECTION OF BLADDER TUMOR; N/A     Comment:  Procedure: TRANSURETHRAL RESECTION OF BLADDER TUMOR               (TURBT), BILATERAL RETROGRADE;  Surgeon: Watt Rush, MD;              [facility];  Service: Urology;  Laterality: N/A;                60 MINUTE CASE 01/06/2021: TRANSURETHRAL RESECTION OF BLADDER TUMOR WITH MITOMYCIN - C; N/A     Comment:  Procedure: TRANSURETHRAL RESECTION OF BLADDER TUMOR WITH              GEMCITIBINE IN PACU;  Surgeon: Watt Rush, MD;                Location: [facility]  Service: Urology;               Laterality: N/A; 09/01/2021: TRANSURETHRAL RESECTION OF BLADDER TUMOR WITH MITOMYCIN - C; N/A     Comment:  Procedure: TRANSURETHRAL RESECTION OF BLADDER TUMOR WITH              POST OPERATIVE INSTILLATION OF GEMCITABINE ;  Surgeon:               Watt Rush, MD;  Location: [facility];                Service: Urology;  Laterality: N/A; 11/13/2022: TRANSURETHRAL RESECTION OF BLADDER TUMOR WITH MITOMYCIN - C; Bilateral     Comment:  Procedure: CYSTOSCOPY TRANSURETHRAL RESECTION OF BLADDER              TUMOR BILATERAL RETROGRADES WITH INSTILL GEMCTABINE;                Surgeon: Watt Rush, MD;  [facility];  Service:               Urology;  Laterality: Bilateral;  1 HR FOR CASE   Reproductive/Obstetrics negative OB ROS                              Anesthesia Physical Anesthesia Plan  ASA: 3  Anesthesia Plan: General ETT   Post-op Pain Management:    Induction:  PONV Risk Score and Plan: 2  Airway Management Planned:   Additional Equipment:   Intra-op Plan:   Post-operative Plan:   Informed Consent: I have reviewed the patients History and Physical, chart, labs and discussed the procedure including the  risks, benefits and alternatives for the proposed anesthesia with the patient or authorized representative who has indicated his/her understanding and acceptance.     Dental Advisory Given  Plan Discussed with: CRNA  Anesthesia Plan Comments:         Anesthesia Quick Evaluation

## 2024-07-28 NOTE — H&P (View-Only) (Signed)
 Primary Physician:  Garald Karlynn GAILS, MD  Chief Complaint: Right upper extremity weakness  History of Present Illness: 07/28/24   Patient is here today for follow-up.  Continues to have weakness in his right upper extremity.  Has been diagnosed with brachial plexopathy.  When seen initially he had significant deficits in his upper extremity but improved his AIN function.  Continue to have PIN weakness.   Review of Systems:  A 10 point review of systems is negative, except for the pertinent positives and negatives detailed in the HPI.  Past Medical History: Past Medical History:  Diagnosis Date   Benign localized prostatic hyperplasia with lower urinary tract symptoms (LUTS)    urologist--- dr watt   Bladder cancer [facility])    urologist---- dr watt---  s/p TURBT 11/2020; 03/ 2022   Coronary artery disease    mild   DDD (degenerative disc disease), cervical    From 1980s   Diabetes mellitus type II    followed by pcp--   (01-05-2021  per pt checks blood sugar three times per week,  fasting sugar --- 130--140_   Difficult intubation 07/28/2012   partial nephrectomy done @[facility]  07-28-2012  found to have anterior larynx , diffcult intubation, referred to anesthesia record in epic    Dyslipidemia    ED (erectile dysfunction)    Elevated PSA    GERD (gastroesophageal reflux disease)    History of adenomatous polyp of colon    History of kidney stones    History of prostatitis 2008   History of renal carcinoma urologist--- dr watt   07-28-2012 for renal neoplasm  s/p right partal nephrectomy    Hypertension    followed by pcp   Neuromuscular disorder (HCC)    Osteoarthritis    Back   Renal carcinoma, right [facility])     Past Surgical History: Past Surgical History:  Procedure Laterality Date   CATARACT EXTRACTION     COLONOSCOPY N/A 03/03/2013   Procedure: COLONOSCOPY;  Surgeon: Norleen LOISE Kiang, MD;  [facility];  Service: Endoscopy;  Laterality: N/A;   COLONOSCOPY  WITH PROPOFOL  N/A 09/29/2019   Procedure: COLONOSCOPY WITH PROPOFOL ;  Surgeon: Kiang Norleen LOISE, MD;  [facility];  Service: Endoscopy;  Laterality: N/A;   CYSTOSCOPY WITH BIOPSY N/A 12/09/2020   Procedure: CYSTOSCOPY WITH TURBT, BIOPSY AND FULGURATION;  Surgeon: watt Norleen, MD;  [facility];  Service: Urology;  Laterality: N/A;  REQUESTING 45 MINS   EXTRACORPOREAL SHOCK WAVE LITHOTRIPSY  01-10-2015;  03-03-2012  @[facility]    IR ANGIOGRAM PELVIS SELECTIVE OR SUPRASELECTIVE  01/21/2024   IR ANGIOGRAM SELECTIVE EACH ADDITIONAL VESSEL  01/21/2024   IR ANGIOGRAM SELECTIVE EACH ADDITIONAL VESSEL  01/21/2024   IR ANGIOGRAM SELECTIVE EACH ADDITIONAL VESSEL  01/21/2024   IR EMBO TUMOR ORGAN ISCHEMIA INFARCT INC GUIDE ROADMAPPING  01/21/2024   IR RADIOLOGIST EVAL & MGMT  12/23/2023   IR US  GUIDE VASC ACCESS LEFT  01/21/2024   IR US  GUIDE VASC ACCESS LEFT  01/21/2024   IR US  GUIDE VASC ACCESS RIGHT  01/21/2024   POLYPECTOMY  09/29/2019   Procedure: POLYPECTOMY;  Surgeon: Kiang Norleen LOISE, MD;  [facility];  Service: Endoscopy;;   ROBOT ASSISTED LAPAROSCOPIC PARTIAL NEPHRECTOMY  07-28-2012   DR RENDA  @[facility]    TONSILLECTOMY  1965   TRANSURETHRAL RESECTION OF BLADDER TUMOR N/A 11/27/2023   Procedure: TRANSURETHRAL RESECTION OF BLADDER TUMOR (TURBT), BILATERAL RETROGRADE;  Surgeon: watt Norleen, MD;  [facility];  Service: Urology;  Laterality:  N/A;  60 MINUTE CASE   TRANSURETHRAL RESECTION OF BLADDER TUMOR WITH MITOMYCIN -C N/A 01/06/2021   Procedure: TRANSURETHRAL RESECTION OF BLADDER TUMOR WITH GEMCITIBINE IN PACU;  Surgeon: Watt Rush, MD;  Location: [facility];  Service: Urology;  Laterality: N/A;   TRANSURETHRAL RESECTION OF BLADDER TUMOR WITH MITOMYCIN -C N/A 09/01/2021   Procedure: TRANSURETHRAL RESECTION OF BLADDER TUMOR WITH POST OPERATIVE INSTILLATION OF GEMCITABINE ;  Surgeon: Watt Rush, MD;  Location: [facility];  Service: Urology;  Laterality: N/A;    TRANSURETHRAL RESECTION OF BLADDER TUMOR WITH MITOMYCIN -C Bilateral 11/13/2022   Procedure: CYSTOSCOPY TRANSURETHRAL RESECTION OF BLADDER TUMOR BILATERAL RETROGRADES WITH INSTILL GEMCTABINE;  Surgeon: Watt Rush, MD;  [facility];  Service: Urology;  Laterality: Bilateral;  1 HR FOR CASE    Allergies: Allergies as of 06/22/2024 - Review Complete 06/18/2024  Allergen Reaction Noted   Tamsulosin  Other (See Comments)    Metformin  and related  03/18/2024   Codeine Itching and Rash 07/20/2020   Wound dressing adhesive Itching and Other (See Comments) 09/16/2021    Medications:  Current Facility-Administered Medications:    0.9 %  sodium chloride  infusion, , Intravenous, Continuous, Piscitello, Fairy POUR, MD   ceFAZolin  (ANCEF ) IVPB 2g/100 mL premix, 2 g, Intravenous, Once, Claudene Penne ORN, MD  Facility-Administered Medications Ordered in Other Encounters:    gemcitabine  (GEMZAR ) chemo syringe for bladder instillation 2,000 mg, 2,000 mg, Bladder Instillation, Once, Watt Rush, MD   Social History: Social History   Tobacco Use   Smoking status: Every Day    Current packs/day: 0.25    Average packs/day: 0.3 packs/day for 54.8 years (13.7 ttl pk-yrs)    Types: Cigarettes    Start date: 10/15/1969   Smokeless tobacco: Never   Tobacco comments:    1 pack every 2 days  Vaping Use   Vaping status: Never Used  Substance Use Topics   Alcohol use: Not Currently   Drug use: Yes    Types: Marijuana    Comment: Smokes marijuana every day    Family Medical History: Family History  Problem Relation Age of Onset   Lymphoma Mother    Colon cancer Mother 51   Dementia Father    Colon cancer Paternal Aunt    Esophageal cancer Neg Hx    Rectal cancer Neg Hx    Stomach cancer Neg Hx     Physical Examination: Vitals:   07/28/24 0634  BP: (!) 176/107  Pulse: 74  Resp: 16  Temp: (!) 97.4 F (36.3 C)  SpO2: 100%     General: Patient is well developed, well nourished, calm,  collected, and in no apparent distress.  NEUROLOGICAL:  General: In no acute distress.   Awake, alert, oriented to person, place, and time.  Pupils equal round and reactive to light.  Facial tone is symmetric.  Tongue protrusion is midline.  There is no pronator drift.  ROM of spine: full.  Palpation of spine: nontender.    Strength: Deltoid strength is full.  External rotation is full.  Elbow flexion is full, elbow extension is full, wrist extension shows radial deviation and weakness with the ECU but full strength with ECR B/L, he has no evidence of radial innervated finger extensors.   He has some mild sensory changes in the C7 distribution on the right  Reflexes are 2+ and symmetric at the biceps, triceps, brachioradialis, patella and achilles. Hoffman's is absent.  Clonus is not present.  Toes are down-going.    Gait is  normal.  No difficulty with tandem gait.    Patient trying to perform right wrist extension and finger extension simultaneously pictured above  Imaging: Narrative & Impression  CLINICAL DATA:  Neck pain. Chronic degenerative changes. Right arm radicular pain. Right upper extremity pain and weakness with some numbness. Symptoms worsening over the last 2 months.   EXAM: MRI CERVICAL SPINE WITHOUT CONTRAST   TECHNIQUE: Multiplanar, multisequence MR imaging of the cervical spine was performed. No intravenous contrast was administered.   COMPARISON:  Radiography 04/30/2023   FINDINGS: Alignment: Normal   Vertebrae: No fracture or focal bone lesion. No edematous endplate marrow changes or edematous facet arthritis.   Cord: No primary cord lesion.  See below regarding stenosis.   Posterior Fossa, vertebral arteries, paraspinal tissues: Negative   Disc levels:   Foramen magnum, C1-2 and C2-3 levels are normal.   C3-4: Chronic degenerative spondylosis with endplate osteophytes and mild bulging of the disc. Mild canal narrowing with AP diameter in the  midline 8.5 mm. No cord compression. Mild bilateral foraminal narrowing, not visibly compressive.   C4-5: Mild bulging of the disc.  No canal or foraminal stenosis.   C5-6: Spondylosis with endplate osteophytes and protruding disc material more prominent in the right posterolateral direction. Effacement of the subarachnoid space with indentation of the cord, particularly on the right. AP diameter of the canal in the midline 7.4 mm. Foraminal stenosis right worse than left. In particular, the right C6 nerve root is likely affected.   C6-7: Endplate osteophytes and bulging of the disc more prominent towards the right. No compressive canal stenosis. Moderate foraminal narrowing on the right that could possibly affect the C7 nerve. This is not as severe as the level above.   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   IMPRESSION: 1. 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 2. C5-6: Spondylosis with endplate osteophytes and protruding disc material more prominent in the right posterolateral direction. Effacement of the subarachnoid space with indentation of the cord, particularly on the right. AP diameter of the canal in the midline 7.4 mm. Foraminal stenosis right worse than left. In particular, the right C6 nerve root is likely affected. 3. C6-7: Endplate osteophytes and bulging of the disc more prominent towards the right. Moderate foraminal narrowing on the right that could possibly affect the C7 nerve. This is not as severe as the level above.     Electronically Signed   By: Oneil Officer M.D.   On: 07/03/2023 17:38        I have personally reviewed the images and agree with the above interpretation.    Labs:    Latest Ref Rng & Units 07/22/2024   11:23 AM 01/21/2024   11:16 AM 11/22/2023    2:39 PM  CBC  WBC 4.0 - 10.5 K/uL 9.0  7.9   7.8   Hemoglobin 13.0 - 17.0 g/dL 84.2  83.3  83.6   Hematocrit 39.0 - 52.0 % 45.8  48.0  47.5   Platelets 150 - 400 K/uL 166  166  163        Assessment and Plan: Mr. Musselman is a pleasant 68 y.o. male with presentation of right upper extremity weakness which is mostly defined by a finger drop without severe wrist drop.  He stated that this came on with a prodrome of severe right shoulder pain that was keeping him from sleeping.  After this shoulder pain wore off he then developed a progressive deficit in his finger extension.  When he first presented back in October he had significant deficits in PIN and AIN innervated musculature, was referred to neurology for evaluation, he has since had occupational therapy and has had an improvement in some of his motor function and overall hand function.  He continues to have a severe deficit in the PIN innervated musculature.  He continues to have weakness in his PIN musculature.  He is here today to discuss PIN decompression and nerve transfers.  He continues to be a candidate for this procedure.  Penne MICAEL Sharps, MD/MSCR Dept. of Neurosurgery

## 2024-07-28 NOTE — Progress Notes (Signed)
 Primary Physician:  Garald Karlynn GAILS, MD  Chief Complaint: Right upper extremity weakness  History of Present Illness: 07/28/24   Patient is here today for follow-up.  Continues to have weakness in his right upper extremity.  Has been diagnosed with brachial plexopathy.  When seen initially he had significant deficits in his upper extremity but improved his AIN function.  Continue to have PIN weakness.   Review of Systems:  A 10 point review of systems is negative, except for the pertinent positives and negatives detailed in the HPI.  Past Medical History: Past Medical History:  Diagnosis Date   Benign localized prostatic hyperplasia with lower urinary tract symptoms (LUTS)    urologist--- dr watt   Bladder cancer [facility])    urologist---- dr watt---  s/p TURBT 11/2020; 03/ 2022   Coronary artery disease    mild   DDD (degenerative disc disease), cervical    From 1980s   Diabetes mellitus type II    followed by pcp--   (01-05-2021  per pt checks blood sugar three times per week,  fasting sugar --- 130--140_   Difficult intubation 07/28/2012   partial nephrectomy done @[facility]  07-28-2012  found to have anterior larynx , diffcult intubation, referred to anesthesia record in epic    Dyslipidemia    ED (erectile dysfunction)    Elevated PSA    GERD (gastroesophageal reflux disease)    History of adenomatous polyp of colon    History of kidney stones    History of prostatitis 2008   History of renal carcinoma urologist--- dr watt   07-28-2012 for renal neoplasm  s/p right partal nephrectomy    Hypertension    followed by pcp   Neuromuscular disorder (HCC)    Osteoarthritis    Back   Renal carcinoma, right [facility])     Past Surgical History: Past Surgical History:  Procedure Laterality Date   CATARACT EXTRACTION     COLONOSCOPY N/A 03/03/2013   Procedure: COLONOSCOPY;  Surgeon: Norleen LOISE Kiang, MD;  Location: [facility];  Service: Endoscopy;  Laterality: N/A;   COLONOSCOPY  WITH PROPOFOL  N/A 09/29/2019   Procedure: COLONOSCOPY WITH PROPOFOL ;  Surgeon: Kiang Norleen LOISE, MD;  Location: [facility];  Service: Endoscopy;  Laterality: N/A;   CYSTOSCOPY WITH BIOPSY N/A 12/09/2020   Procedure: CYSTOSCOPY WITH TURBT, BIOPSY AND FULGURATION;  Surgeon: watt Norleen, MD;  Location: [facility];  Service: Urology;  Laterality: N/A;  REQUESTING 45 MINS   EXTRACORPOREAL SHOCK WAVE LITHOTRIPSY  01-10-2015;  03-03-2012  @[facility]    IR ANGIOGRAM PELVIS SELECTIVE OR SUPRASELECTIVE  01/21/2024   IR ANGIOGRAM SELECTIVE EACH ADDITIONAL VESSEL  01/21/2024   IR ANGIOGRAM SELECTIVE EACH ADDITIONAL VESSEL  01/21/2024   IR ANGIOGRAM SELECTIVE EACH ADDITIONAL VESSEL  01/21/2024   IR EMBO TUMOR ORGAN ISCHEMIA INFARCT INC GUIDE ROADMAPPING  01/21/2024   IR RADIOLOGIST EVAL & MGMT  12/23/2023   IR US  GUIDE VASC ACCESS LEFT  01/21/2024   IR US  GUIDE VASC ACCESS LEFT  01/21/2024   IR US  GUIDE VASC ACCESS RIGHT  01/21/2024   POLYPECTOMY  09/29/2019   Procedure: POLYPECTOMY;  Surgeon: Kiang Norleen LOISE, MD;  Location: [facility];  Service: Endoscopy;;   ROBOT ASSISTED LAPAROSCOPIC PARTIAL NEPHRECTOMY  07-28-2012   DR RENDA  @[facility]    TONSILLECTOMY  1965   TRANSURETHRAL RESECTION OF BLADDER TUMOR N/A 11/27/2023   Procedure: TRANSURETHRAL RESECTION OF BLADDER TUMOR (TURBT), BILATERAL RETROGRADE;  Surgeon: watt Norleen, MD;  Location: [facility];  Service: Urology;  Laterality:  N/A;  60 MINUTE CASE   TRANSURETHRAL RESECTION OF BLADDER TUMOR WITH MITOMYCIN -C N/A 01/06/2021   Procedure: TRANSURETHRAL RESECTION OF BLADDER TUMOR WITH GEMCITIBINE IN PACU;  Surgeon: Watt Rush, MD;  Location: [facility];  Service: Urology;  Laterality: N/A;   TRANSURETHRAL RESECTION OF BLADDER TUMOR WITH MITOMYCIN -C N/A 09/01/2021   Procedure: TRANSURETHRAL RESECTION OF BLADDER TUMOR WITH POST OPERATIVE INSTILLATION OF GEMCITABINE ;  Surgeon: Watt Rush, MD;  Location: [facility];  Service: Urology;  Laterality: N/A;    TRANSURETHRAL RESECTION OF BLADDER TUMOR WITH MITOMYCIN -C Bilateral 11/13/2022   Procedure: CYSTOSCOPY TRANSURETHRAL RESECTION OF BLADDER TUMOR BILATERAL RETROGRADES WITH INSTILL GEMCTABINE;  Surgeon: Watt Rush, MD;  Location: [facility];  Service: Urology;  Laterality: Bilateral;  1 HR FOR CASE    Allergies: Allergies as of 06/22/2024 - Review Complete 06/18/2024  Allergen Reaction Noted   Tamsulosin  Other (See Comments)    Metformin  and related  03/18/2024   Codeine Itching and Rash 07/20/2020   Wound dressing adhesive Itching and Other (See Comments) 09/16/2021    Medications:  Current Facility-Administered Medications:    0.9 %  sodium chloride  infusion, , Intravenous, Continuous, Piscitello, Fairy POUR, MD   ceFAZolin  (ANCEF ) IVPB 2g/100 mL premix, 2 g, Intravenous, Once, Claudene Penne ORN, MD  Facility-Administered Medications Ordered in Other Encounters:    gemcitabine  (GEMZAR ) chemo syringe for bladder instillation 2,000 mg, 2,000 mg, Bladder Instillation, Once, Watt Rush, MD   Social History: Social History   Tobacco Use   Smoking status: Every Day    Current packs/day: 0.25    Average packs/day: 0.3 packs/day for 54.8 years (13.7 ttl pk-yrs)    Types: Cigarettes    Start date: 10/15/1969   Smokeless tobacco: Never   Tobacco comments:    1 pack every 2 days  Vaping Use   Vaping status: Never Used  Substance Use Topics   Alcohol use: Not Currently   Drug use: Yes    Types: Marijuana    Comment: Smokes marijuana every day    Family Medical History: Family History  Problem Relation Age of Onset   Lymphoma Mother    Colon cancer Mother 51   Dementia Father    Colon cancer Paternal Aunt    Esophageal cancer Neg Hx    Rectal cancer Neg Hx    Stomach cancer Neg Hx     Physical Examination: Vitals:   07/28/24 0634  BP: (!) 176/107  Pulse: 74  Resp: 16  Temp: (!) 97.4 F (36.3 C)  SpO2: 100%     General: Patient is well developed, well nourished, calm,  collected, and in no apparent distress.  NEUROLOGICAL:  General: In no acute distress.   Awake, alert, oriented to person, place, and time.  Pupils equal round and reactive to light.  Facial tone is symmetric.  Tongue protrusion is midline.  There is no pronator drift.  ROM of spine: full.  Palpation of spine: nontender.    Strength: Deltoid strength is full.  External rotation is full.  Elbow flexion is full, elbow extension is full, wrist extension shows radial deviation and weakness with the ECU but full strength with ECR B/L, he has no evidence of radial innervated finger extensors.   He has some mild sensory changes in the C7 distribution on the right  Reflexes are 2+ and symmetric at the biceps, triceps, brachioradialis, patella and achilles. Hoffman's is absent.  Clonus is not present.  Toes are down-going.    Gait is  normal.  No difficulty with tandem gait.    Patient trying to perform right wrist extension and finger extension simultaneously pictured above  Imaging: Narrative & Impression  CLINICAL DATA:  Neck pain. Chronic degenerative changes. Right arm radicular pain. Right upper extremity pain and weakness with some numbness. Symptoms worsening over the last 2 months.   EXAM: MRI CERVICAL SPINE WITHOUT CONTRAST   TECHNIQUE: Multiplanar, multisequence MR imaging of the cervical spine was performed. No intravenous contrast was administered.   COMPARISON:  Radiography 04/30/2023   FINDINGS: Alignment: Normal   Vertebrae: No fracture or focal bone lesion. No edematous endplate marrow changes or edematous facet arthritis.   Cord: No primary cord lesion.  See below regarding stenosis.   Posterior Fossa, vertebral arteries, paraspinal tissues: Negative   Disc levels:   Foramen magnum, C1-2 and C2-3 levels are normal.   C3-4: Chronic degenerative spondylosis with endplate osteophytes and mild bulging of the disc. Mild canal narrowing with AP diameter in the  midline 8.5 mm. No cord compression. Mild bilateral foraminal narrowing, not visibly compressive.   C4-5: Mild bulging of the disc.  No canal or foraminal stenosis.   C5-6: Spondylosis with endplate osteophytes and protruding disc material more prominent in the right posterolateral direction. Effacement of the subarachnoid space with indentation of the cord, particularly on the right. AP diameter of the canal in the midline 7.4 mm. Foraminal stenosis right worse than left. In particular, the right C6 nerve root is likely affected.   C6-7: Endplate osteophytes and bulging of the disc more prominent towards the right. No compressive canal stenosis. Moderate foraminal narrowing on the right that could possibly affect the C7 nerve. This is not as severe as the level above.   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   IMPRESSION: 1. 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 2. C5-6: Spondylosis with endplate osteophytes and protruding disc material more prominent in the right posterolateral direction. Effacement of the subarachnoid space with indentation of the cord, particularly on the right. AP diameter of the canal in the midline 7.4 mm. Foraminal stenosis right worse than left. In particular, the right C6 nerve root is likely affected. 3. C6-7: Endplate osteophytes and bulging of the disc more prominent towards the right. Moderate foraminal narrowing on the right that could possibly affect the C7 nerve. This is not as severe as the level above.     Electronically Signed   By: Oneil Officer M.D.   On: 07/03/2023 17:38        I have personally reviewed the images and agree with the above interpretation.    Labs:    Latest Ref Rng & Units 07/22/2024   11:23 AM 01/21/2024   11:16 AM 11/22/2023    2:39 PM  CBC  WBC 4.0 - 10.5 K/uL 9.0  7.9   7.8   Hemoglobin 13.0 - 17.0 g/dL 84.2  83.3  83.6   Hematocrit 39.0 - 52.0 % 45.8  48.0  47.5   Platelets 150 - 400 K/uL 166  166  163        Assessment and Plan: Mr. Musselman is a pleasant 68 y.o. male with presentation of right upper extremity weakness which is mostly defined by a finger drop without severe wrist drop.  He stated that this came on with a prodrome of severe right shoulder pain that was keeping him from sleeping.  After this shoulder pain wore off he then developed a progressive deficit in his finger extension.  When he first presented back in October he had significant deficits in PIN and AIN innervated musculature, was referred to neurology for evaluation, he has since had occupational therapy and has had an improvement in some of his motor function and overall hand function.  He continues to have a severe deficit in the PIN innervated musculature.  He continues to have weakness in his PIN musculature.  He is here today to discuss PIN decompression and nerve transfers.  He continues to be a candidate for this procedure.  Penne MICAEL Sharps, MD/MSCR Dept. of Neurosurgery

## 2024-07-28 NOTE — Discharge Instructions (Addendum)

## 2024-07-28 NOTE — Interval H&P Note (Signed)
 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  The various methods of treatment have been discussed with the patient and family. After consideration of risks, benefits and other options for treatment, the patient has consented to  Procedure(s) with comments: DECOMPRESSION, NERVE, POSTERIOR INTEROSSEOUS (Right) - RIGHT SIDED POSTERIOR INTEROSSEOUS NERVE (PIN) DECOMPRESSION NERVE TRANSFER (Right) - POSSIBLE SUPINATOR TO POSTERIOR INTEROSSEOUS NERVE (PIN) NERVE TRANSFER,  POSSIBLE FLEXOR CARPI RADIALIS (FCR) TO POSTERIOR INTEROSSEOUS NERVE (PIN) NERVE TRANSFER as a surgical intervention.  The patient's history has been reviewed, patient examined, no change in status, stable for surgery.  I have reviewed the patient's chart and labs.  Questions were answered to the patient's satisfaction.    Heart and lungs clear   Penne LELON Sharps

## 2024-07-31 NOTE — Anesthesia Postprocedure Evaluation (Signed)
 Anesthesia Post Note  Patient: [name] [name]  Procedure(s) Performed: DECOMPRESSION, NERVE, POSTERIOR INTEROSSEOUS (Right)  Patient location during evaluation: PACU Anesthesia Type: General Level of consciousness: awake and alert Pain management: pain level controlled Vital Signs Assessment: post-procedure vital signs reviewed and stable Respiratory status: spontaneous breathing, nonlabored ventilation, respiratory function stable and patient connected to nasal cannula oxygen Cardiovascular status: blood pressure returned to baseline and stable Postop Assessment: no apparent nausea or vomiting Anesthetic complications: no   No notable events documented.   Last Vitals:  Vitals:   07/28/24 0951 07/28/24 1003  BP: 137/83 (!) 161/89  Pulse: 70 71  Resp: 16 16  Temp: 36.7 C 36.7 C  SpO2: 94% 97%    Last Pain:  Vitals:   07/28/24 1003  TempSrc: Temporal  PainSc: 4                  Lynwood KANDICE Clause

## 2024-08-05 DIAGNOSIS — C672 Malignant neoplasm of lateral wall of bladder: Secondary | ICD-10-CM | POA: Diagnosis not present

## 2024-08-10 ENCOUNTER — Encounter: Admitting: Physician Assistant

## 2024-08-11 ENCOUNTER — Encounter: Payer: Self-pay | Admitting: Physician Assistant

## 2024-08-11 ENCOUNTER — Ambulatory Visit (INDEPENDENT_AMBULATORY_CARE_PROVIDER_SITE_OTHER): Admitting: Physician Assistant

## 2024-08-11 VITALS — BP 154/82 | Temp 98.4°F | Ht 66.0 in | Wt 174.0 lb

## 2024-08-11 DIAGNOSIS — Z09 Encounter for follow-up examination after completed treatment for conditions other than malignant neoplasm: Secondary | ICD-10-CM

## 2024-08-11 DIAGNOSIS — G5631 Lesion of radial nerve, right upper limb: Secondary | ICD-10-CM

## 2024-08-11 DIAGNOSIS — G5611 Other lesions of median nerve, right upper limb: Secondary | ICD-10-CM

## 2024-08-11 NOTE — Progress Notes (Signed)
   REFERRING PHYSICIAN:  Plotnikov, Karlynn GAILS, Md [address] [place],  [place] [address]  DOS: 07/28/24, right PIN decompression  HISTORY OF PRESENT ILLNESS: [name] [name] is 2 weeks status post right PIN decompression following a brachial plexopathy in which his AIN recovered. Overall, he is doing well.  He has noticed some muscle stiffness and tightness in his hand.  He still has weakness as expected at this time.  His pain is under control.    PHYSICAL EXAMINATION:  NEUROLOGICAL:  General: In no acute distress.   Awake, alert, oriented to person, place, and time.  Pupils equal round and reactive to light.  Facial tone is symmetric.    Strength:  Patient has good proximal strength to baseline.  Still little send no evidence of finger extensors with significant strength.   Incision c/d/I  Imaging:    Assessment / Plan: [name] [name] is doing well after right PIN decompression. We discussed activity escalation and I have advised the patient to lift up to 10 pounds until 6 weeks after surgery, then increase up to 25 pounds until 12 weeks after surgery.  After 12 weeks post-op, the patient advised to increase activity as tolerated. he will return to clinic in approximately 1 month for 6-week postop visit.    Advised to contact the office if any questions or concerns arise.   Lyle Decamp PA-C Dept of Neurosurgery

## 2024-08-11 NOTE — Op Note (Signed)
 Indications: 68 year old man with a history of a severe polyneuropathy in the right upper extremity.  He had some recovery in his anterior interosseous nerve distribution, but continued to have PIN symptomatology.  We gave him his considerable amount of time to recover but he had variable to the limited recovery in his PIN.  Because of this we planned for a PIN decompression and possible nerve transfer  Findings: Intraoperative stimulation demonstrated right sided PIN stimulation with stimulation of both the radial nerve proximal and the PIN itself.  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   Preoperative Note: Risk of surgery is discussed and include: Infection, bleeding, wound healing issues, nerve injury, pain, failure to relieve the symptoms, need for further surgery.  Procedure:  1) right sided PIN decompression and intraoperative nerve testing   Procedure: After obtaining informed consent, the patient taken to the operating room, placed in supine position, monitored anesthesia care was induced.  They were given preoperative antibiotics.  Prepped and draped in the usual fashion.  Comprehensive timeout was performed verifying the patient's name, MRN, planned procedure.  The humerus was padded, elbow was externally rotated, a curvilinear incision was planned between the interval of the brachial radialis as well as the brachialis.  This was extended down through the anterior cubital fossa and down the mid forearm.  Local anesthetic was infiltrated.  We started with a linear incision and followed  around until we had full opening of the planned site.  We dissected down with meticulous care to take care of any crossing neurovascular structures.  We are able to identify the plane between the brachialis and brachial radialis.  As we dissected down to this we are able to identify the distal aspect of the common radial nerve.  We followed this down continuing to neurolysed as we crossed the elbow.  We are able to find the supinator musculature and found a strong band of fascia at this level compressing the PIN.  We decompressed the PIN at this level and continue to follow for any more compression points.  The leash of Victory was noted and decompressed.   Once the nerve was fully decompressed from proximal to distal we started our nerve stimulation.  Proximal nerve stimulation demonstrated continuity from the proximal nerve to the distal PIN targets.  Because of this we did not plan to move forward for any of the nerve transfers as he was showing improved function at this level.  We continued the decompression to make sure there were no more ongoing sites.  We then copiously irrigated and obtained meticulous hemostasis.  The wound was closed in multiple layers with Dermabond on the topmost layer.  No immediate complications.  Sponge and pattie counts were correct at the end of the procedure.   Edsel Goods assisted in the procedure. An assistant was required for this procedure due to the complexity.  The assistant provided assistance in tissue manipulation and suction, and was required for the successful and safe performance of the procedure. I performed the critical portions of the procedure.  Penne MICAEL Sharps, MD/MSCR

## 2024-08-12 DIAGNOSIS — Z8551 Personal history of malignant neoplasm of bladder: Secondary | ICD-10-CM | POA: Diagnosis not present

## 2024-09-07 ENCOUNTER — Encounter: Payer: Self-pay | Admitting: Neurosurgery

## 2024-09-07 ENCOUNTER — Ambulatory Visit (INDEPENDENT_AMBULATORY_CARE_PROVIDER_SITE_OTHER): Admitting: Neurosurgery

## 2024-09-07 VITALS — BP 138/92 | Ht 66.0 in | Wt 174.0 lb

## 2024-09-07 DIAGNOSIS — G5631 Lesion of radial nerve, right upper limb: Secondary | ICD-10-CM

## 2024-09-07 DIAGNOSIS — Z09 Encounter for follow-up examination after completed treatment for conditions other than malignant neoplasm: Secondary | ICD-10-CM

## 2024-09-07 NOTE — Progress Notes (Signed)
   REFERRING PHYSICIAN:  Plotnikov, [name] GAILS, Md [address] [place],  [place] [address]  DOS: 07/28/24, right PIN decompression  Discussed the use of AI scribe software for clinical note transcription with the patient, who gave verbal consent to proceed.  History of Present Illness [name] [name] is a 68 year old male with a history of wrist surgery and bulging cervical discs who presents with ongoing hand and wrist symptoms.  He is currently 6 weeks status post right sided PIN decompression and testing without nerve transfer.  He does feel that he has had improved numbness and tingling.  His pain in his will controlled.  His incision is doing well.  He continues to have weakness in his hand as expected.  He does get some fluctuations in his hand sensory changes.  He cannot lift his fingers off the table except for the pinky, despite preserved wrist appearance.  He has fluctuating tightness in the dorsum of the hand with daily tingling and numbness that can encircle the hand. These symptoms improved after wrist surgery but persist.  An MRI of the cervical spine showed bulging discs at C6-7-8. Symptoms occasionally wrap around to the opposite arm. He is unsure if his left-sided symptoms are related to the cervical findings.   PHYSICAL EXAMINATION:  NEUROLOGICAL:  General: In no acute distress.   Awake, alert, oriented to person, place, and time.  Pupils equal round and reactive to light.  Facial tone is symmetric.    Strength:  Patient has good proximal strength to baseline.  Still little send no evidence of finger extensors with significant strength, with the exception of the fourth digit.   Incision c/d/I  Imaging:    Assessment and Plan Assessment & Plan Right upper extremity posterior and anterior interosseous mononeuropathy status post surgical intervention Post-surgical recovery is progressing well with significant improvement in wrist function. Persistent difficulty  in lifting fingers, particularly the index, middle, and ring fingers, with some improvement in the pinky. Reports day-to-day variability in sensation, with tightness and occasional tingling and numbness. Symptoms have improved since the initial post-operative period. - Continue to monitor recovery progress and symptoms. - Will schedule follow-up appointment in 1-2 months.  Cervical disc bulge with radiculopathy Cervical disc bulge with radiculopathy, previously identified on MRI. Symptoms include tingling and numbness, potentially related to cervical spine issues. Symptoms have been present for over a year, with no significant worsening. Previous MRI was conducted in September 2024. Decision to defer further imaging until follow-up with Dr. Tobie in November. - Will follow up with Dr. Tobie in November to assess symptoms and determine need for further imaging. - Will consider repeat MRI if symptoms worsen or if Dr. Tobie recommends.

## 2024-09-16 ENCOUNTER — Other Ambulatory Visit

## 2024-09-16 DIAGNOSIS — E876 Hypokalemia: Secondary | ICD-10-CM | POA: Diagnosis not present

## 2024-09-16 DIAGNOSIS — E118 Type 2 diabetes mellitus with unspecified complications: Secondary | ICD-10-CM

## 2024-09-16 LAB — COMPREHENSIVE METABOLIC PANEL WITH GFR
ALT: 24 U/L (ref 0–53)
AST: 11 U/L (ref 0–37)
Albumin: 4.2 g/dL (ref 3.5–5.2)
Alkaline Phosphatase: 95 U/L (ref 39–117)
BUN: 18 mg/dL (ref 6–23)
CO2: 29 meq/L (ref 19–32)
Calcium: 9.6 mg/dL (ref 8.4–10.5)
Chloride: 104 meq/L (ref 96–112)
Creatinine, Ser: 0.83 mg/dL (ref 0.40–1.50)
GFR: 88.9 mL/min (ref >60.00)
Glucose, Bld: 148 mg/dL — ABNORMAL HIGH (ref 70–99)
Potassium: 3.3 meq/L — ABNORMAL LOW (ref 3.5–5.1)
Sodium: 141 meq/L (ref 135–145)
Total Bilirubin: 0.5 mg/dL (ref 0.2–1.2)
Total Protein: 6.6 g/dL (ref 6.0–8.3)

## 2024-09-16 LAB — HEMOGLOBIN A1C: Hgb A1c MFr Bld: 6.4 % (ref 4.6–6.5)

## 2024-09-17 ENCOUNTER — Encounter: Payer: Self-pay | Admitting: Internal Medicine

## 2024-09-17 ENCOUNTER — Ambulatory Visit: Admitting: Internal Medicine

## 2024-09-17 VITALS — BP 162/96 | HR 74 | Temp 98.1°F | Ht 66.0 in | Wt 175.2 lb

## 2024-09-17 DIAGNOSIS — E876 Hypokalemia: Secondary | ICD-10-CM | POA: Diagnosis not present

## 2024-09-17 DIAGNOSIS — R413 Other amnesia: Secondary | ICD-10-CM

## 2024-09-17 DIAGNOSIS — Z7985 Long-term (current) use of injectable non-insulin antidiabetic drugs: Secondary | ICD-10-CM

## 2024-09-17 DIAGNOSIS — K5900 Constipation, unspecified: Secondary | ICD-10-CM | POA: Insufficient documentation

## 2024-09-17 DIAGNOSIS — K219 Gastro-esophageal reflux disease without esophagitis: Secondary | ICD-10-CM

## 2024-09-17 DIAGNOSIS — K5904 Chronic idiopathic constipation: Secondary | ICD-10-CM

## 2024-09-17 DIAGNOSIS — E1142 Type 2 diabetes mellitus with diabetic polyneuropathy: Secondary | ICD-10-CM

## 2024-09-17 DIAGNOSIS — E1129 Type 2 diabetes mellitus with other diabetic kidney complication: Secondary | ICD-10-CM

## 2024-09-17 MED ORDER — FAMOTIDINE 20 MG PO TABS: 20.0000 mg | ORAL_TABLET | Freq: Two times a day (BID) | ORAL | 3 refills | Status: AC

## 2024-09-17 MED ORDER — POTASSIUM CHLORIDE ER 10 MEQ PO TBCR: 10.0000 meq | EXTENDED_RELEASE_TABLET | Freq: Two times a day (BID) | ORAL | 11 refills | Status: AC

## 2024-09-17 MED ORDER — ALIGN 10 MG PO CAPS: 1.0000 | ORAL_CAPSULE | Freq: Every day | ORAL | 3 refills | Status: AC

## 2024-09-17 NOTE — Assessment & Plan Note (Signed)
 Burps on Ozempic : use Pepcid, Align

## 2024-09-17 NOTE — Assessment & Plan Note (Addendum)
 Better Monitor A1c Ozempic  - 2 mg/d. Use probiotic Align one a day as needed and over the counter Pepcid  for burping

## 2024-09-17 NOTE — Progress Notes (Signed)
 Subjective:  Patient ID: [name] [name], male    DOB: 11/23/1955  Age: 68 y.o. MRN: [id number]  CC: Medical Management of Chronic Issues (3 Month follow up)   HPI [name] [name] presents for DM, HTN, CRI C/o burps - taking Pepcid 10 mg/d  Outpatient Medications Prior to Visit  Medication Sig Dispense Refill   amLODipine  (NORVASC ) 5 MG tablet Take 1 tablet (5 mg total) by mouth daily. 90 tablet 3   Ascorbic Acid (VITAMIN C) 1000 MG tablet Take 1,000 mg by mouth once a week.     blood glucose meter kit and supplies KIT Use to test blood sugar once a day. DX: E11.9 1 each 0   Blood Glucose Monitoring Suppl (CONTOUR NEXT MONITOR) w/Device KIT 1 Device by Does not apply route daily. 1 kit 0   cholecalciferol (VITAMIN D ) 1000 UNITS tablet Take 1,000 Units by mouth daily.     clobetasol  (TEMOVATE ) 0.05 % external solution Apply 1 Application topically 2 (two) times daily as needed. Itchy scalp 50 mL 3   DULoxetine  (CYMBALTA ) 30 MG capsule TAKE 1 CAPSULE (30 MG TOTAL) BY MOUTH AT BEDTIME. (Patient taking differently: Take 30 mg by mouth daily as needed (neuropathy).) 90 capsule 2   glimepiride  (AMARYL ) 4 MG tablet TAKE 1 TABLET (4 MG TOTAL) BY MOUTH IN THE MORNING AND AT BEDTIME 180 tablet 3   glucose blood (CONTOUR NEXT TEST) test strip 1 each by Other route daily. Use to check blood sugars twice a day. 100 strip 12   glucose blood (CONTOUR TEST) test strip USE TO CHECK BLOOD SUGAR DAILY AND AS NEEDED. 100 strip 5   Lancets MISC Use to check blood sugar once per day. 100 each 3   metFORMIN  (GLUCOPHAGE ) 500 MG tablet Take 1 tablet (500 mg total) by mouth daily with breakfast. (Patient taking differently: Take 500 mg by mouth 2 (two) times daily with a meal.) 90 tablet 3   olmesartan  (BENICAR ) 40 MG tablet Take 1 tablet (40 mg total) by mouth daily. 90 tablet 3   potassium chloride  SA (KLOR-CON  M) 20 MEQ tablet Take 1 tablet (20 mEq total) by mouth daily. (Patient taking differently: Take 20  mEq by mouth 3 (three) times a week.) 90 tablet 3   Semaglutide , 2 MG/DOSE, 8 MG/3ML SOPN Inject 2 mg as directed once a week. 3 mL 3   tadalafil  (CIALIS ) 20 MG tablet Take 1 tablet (20 mg total) by mouth every three (3) days as needed for erectile dysfunction. 10 tablet 5   Vibegron (GEMTESA) 75 MG TABS Take 1 tablet by mouth daily.     famotidine (PEPCID) 10 MG tablet Take 10 mg by mouth daily.     Facility-Administered Medications Prior to Visit  Medication Dose Route Frequency Provider Last Rate Last Admin   gemcitabine  (GEMZAR ) chemo syringe for bladder instillation 2,000 mg  2,000 mg Bladder Instillation Once Wrenn, John, MD        ROS: Review of Systems  Constitutional:  Negative for appetite change, fatigue and unexpected weight change.  HENT:  Negative for congestion, nosebleeds, sneezing, sore throat and trouble swallowing.   Eyes:  Negative for itching and visual disturbance.  Respiratory:  Negative for cough.   Cardiovascular:  Negative for chest pain, palpitations and leg swelling.  Gastrointestinal:  Negative for abdominal distention, blood in stool, diarrhea and nausea.  Genitourinary:  Negative for frequency and hematuria.  Musculoskeletal:  Positive for back pain, neck pain and neck stiffness. Negative  for gait problem and joint swelling.  Skin:  Negative for rash.  Neurological:  Negative for dizziness, tremors, speech difficulty and weakness.  Psychiatric/Behavioral:  Negative for agitation, dysphoric mood and sleep disturbance. The patient is not nervous/anxious.     Objective:  BP (!) 162/96   Pulse 74   Temp 98.1 F (36.7 C)   Ht 5' 6 (1.676 m)   Wt 175 lb 3.2 oz (79.5 kg)   SpO2 99%   BMI 28.28 kg/m   BP Readings from Last 3 Encounters:  09/17/24 (!) 162/96  09/07/24 (!) 138/92  08/11/24 (!) 154/82    Wt Readings from Last 3 Encounters:  09/17/24 175 lb 3.2 oz (79.5 kg)  09/07/24 174 lb (78.9 kg)  08/11/24 174 lb (78.9 kg)    Physical  Exam Constitutional:      General: He is not in acute distress.    Appearance: He is well-developed. He is obese.     Comments: NAD  Eyes:     Conjunctiva/sclera: Conjunctivae normal.     Pupils: Pupils are equal, round, and reactive to light.  Neck:     Thyroid : No thyromegaly.     Vascular: No JVD.  Cardiovascular:     Rate and Rhythm: Normal rate and regular rhythm.     Heart sounds: Normal heart sounds. No murmur heard.    No friction rub. No gallop.  Pulmonary:     Effort: Pulmonary effort is normal. No respiratory distress.     Breath sounds: Normal breath sounds. No wheezing or rales.  Chest:     Chest wall: No tenderness.  Abdominal:     General: Bowel sounds are normal. There is no distension.     Palpations: Abdomen is soft. There is no mass.     Tenderness: There is no abdominal tenderness. There is no guarding or rebound.  Musculoskeletal:        General: Tenderness present. Normal range of motion.     Cervical back: Normal range of motion.     Right lower leg: No edema.     Left lower leg: No edema.  Lymphadenopathy:     Cervical: No cervical adenopathy.  Skin:    General: Skin is warm and dry.     Findings: No rash.  Neurological:     Mental Status: He is alert and oriented to person, place, and time.     Cranial Nerves: No cranial nerve deficit.     Motor: No abnormal muscle tone.     Coordination: Coordination normal.     Gait: Gait normal.     Deep Tendon Reflexes: Reflexes are normal and symmetric.  Psychiatric:        Behavior: Behavior normal.        Thought Content: Thought content normal.        Judgment: Judgment normal.   L arm is painful to deep palpation  Lab Results  Component Value Date   WBC 9.0 07/22/2024   HGB 15.7 07/22/2024   HCT 45.8 07/22/2024   PLT 166 07/22/2024   GLUCOSE 148 (H) 09/16/2024   CHOL 153 03/06/2021   TRIG 170.0 (H) 03/06/2021   HDL 33.50 (L) 03/06/2021   LDLCALC 86 03/06/2021   ALT 24 09/16/2024   AST 11  09/16/2024   NA 141 09/16/2024   K 3.3 (L) 09/16/2024   CL 104 09/16/2024   CREATININE 0.83 09/16/2024   BUN 18 09/16/2024   CO2 29 09/16/2024   TSH 0.64 03/06/2021  PSA 3.87 03/06/2021   INR 1.1 01/21/2024   HGBA1C 6.4 09/16/2024   MICROALBUR 2.1 (H) 06/10/2024    No results found.  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 Use probiotic Align one a day as needed and over the counter Pepcid for burping      Relevant Orders   Comprehensive metabolic panel with GFR   Hemoglobin A1c   Gastroesophageal reflux disease   Burps on Ozempic : use Pepcid, Align      Relevant Medications   Probiotic Product (ALIGN) 10 MG CAPS   famotidine (PEPCID) 20 MG tablet   Hypokalemia   Re-start KCl Change to Klorcon10 meq bid due to size      Memory problem   Get shingrix shot Smoke less pot      Polyneuropathy due to type 2 diabetes mellitus (HCC)   F/u w/Dr Tobie         Meds ordered this encounter  Medications   Probiotic Product (ALIGN) 10 MG CAPS    Sig: Take 1 capsule by mouth daily.    Dispense:  30 capsule    Refill:  3   famotidine (PEPCID) 20 MG tablet    Sig: Take 1 tablet (20 mg total) by mouth 2 (two) times daily.    Dispense:  90 tablet    Refill:  3   potassium chloride  (KLOR-CON  10) 10 MEQ tablet    Sig: Take 1 tablet (10 mEq total) by mouth 2 (two) times daily.    Dispense:  60 tablet    Refill:  11      Follow-up: Return in about 3 months (around 12/16/2024) for a follow-up visit.  Marolyn Noel, MD

## 2024-09-17 NOTE — Patient Instructions (Signed)

## 2024-09-17 NOTE — Assessment & Plan Note (Signed)
 Get shingrix shot Smoke less pot

## 2024-09-17 NOTE — Assessment & Plan Note (Addendum)
 Worse on Ozempic  Use Miralax  3 scoop and Dulcolax 2 tablets as needed

## 2024-09-17 NOTE — Assessment & Plan Note (Signed)
F/u w/Dr Patel ?

## 2024-09-17 NOTE — Assessment & Plan Note (Signed)
 Re-start KCl Change to Klorcon10 meq bid due to size

## 2024-09-22 ENCOUNTER — Encounter: Payer: Self-pay | Admitting: Internal Medicine

## 2024-09-22 LAB — FRUCTOSAMINE: Fructosamine: 251 umol/L (ref 205–285)

## 2024-09-27 ENCOUNTER — Other Ambulatory Visit: Payer: Self-pay | Admitting: Internal Medicine

## 2024-09-29 ENCOUNTER — Encounter: Payer: Self-pay | Admitting: *Deleted

## 2024-09-29 NOTE — Progress Notes (Signed)
 [name] [name]                                          MRN: [id number]   09/29/2024   The [facility]BCI Quality Team Specialist reviewed this patient medical record for the purposes of chart review for care gap closure. The following were reviewed: abstraction for care gap closure-glycemic status assessment and kidney health evaluation for diabetes:eGFR  and uACR.    VBCI Quality Team

## 2024-10-04 ENCOUNTER — Other Ambulatory Visit: Payer: Self-pay | Admitting: Internal Medicine

## 2024-10-16 ENCOUNTER — Encounter: Payer: Self-pay | Admitting: Internal Medicine

## 2024-10-19 ENCOUNTER — Encounter: Admitting: Physician Assistant

## 2024-10-21 ENCOUNTER — Telehealth: Payer: Self-pay

## 2024-10-21 NOTE — Telephone Encounter (Signed)
 Telephone call to patient, he was informed of Dr. Nancyann instructions and verbalized understanding.  PV will remain on 1/19, changed to in-person.  If no issues identified at [facility], may schedule direct at hospital per Dr. Abran HOCP (difficult intubation-Anterior Larynx)  [facility] colonoscopy on 2/2 canceled. SChaplin, RN,BSN

## 2024-10-21 NOTE — Telephone Encounter (Signed)
 1.  Needs to see previsit nurse 2.  If no issues, okay to schedule directly at the hospital for surveillance colonoscopy for history of colon polyps. 3.  Remove him from the [facility] schedule Thanks, Dr. Abran

## 2024-10-21 NOTE — Telephone Encounter (Signed)
 Good Morning, This patient is scheduled for a PV on 1/19 and a colonoscopy (HOCP) in the [facility] with Dr. Abran on 2/2.  However, per chart review, it is documented he is a difficult intubation (last procedure was done at [facility]).   Dr. Abran, please advise if you are ok with a direct or if you would like an OV first. Thank you

## 2024-10-28 ENCOUNTER — Encounter: Payer: Self-pay | Admitting: Internal Medicine

## 2024-10-30 LAB — OPHTHALMOLOGY REPORT-SCANNED

## 2024-11-02 ENCOUNTER — Telehealth: Payer: Self-pay | Admitting: *Deleted

## 2024-11-02 ENCOUNTER — Other Ambulatory Visit: Payer: Self-pay

## 2024-11-02 ENCOUNTER — Ambulatory Visit: Admitting: *Deleted

## 2024-11-02 VITALS — Ht 66.0 in | Wt 178.0 lb

## 2024-11-02 DIAGNOSIS — Z860100 Personal history of colon polyps, unspecified: Secondary | ICD-10-CM

## 2024-11-02 MED ORDER — NA SULFATE-K SULFATE-MG SULF 17.5-3.13-1.6 GM/177ML PO SOLN: 1.0000 | Freq: Once | ORAL | 0 refills | Status: AC

## 2024-11-02 NOTE — Progress Notes (Signed)
 Pt's name and DOB verified at the beginning of the pre-visit with 2 identifiers  Permission given to speak with  Pt denies any difficulty with ambulating,sitting, laying down or rolling side to side  Pt has no issues moving head neck or swallowing  No egg or soy allergy known to patient   No issues known to pt with past sedation  No FH of Malignant Hyperthermia  Pt is not on home 02   Pt is not on blood thinners   Pt denies issues with constipation   Pt is not on dialysis  Pt denise any abnormal heart rhythms   Pt denies any upcoming cardiac testing    TE sent to MD and [facility] for evaluation of needing hospital vs [facility] procedure date Gave both [facility] and hospital instructions to pt with no dates or times and instructed pt the differences between the two and hospital RN's will contact him if having it done there vs [facility]. Pt states he understands.  Visit in person  Pt states weight is 178 lb  Instructed not to smoke Marijuana day before and day of procedure  Pt given  both [facility] main # and MD on call # prior to instructions.  Informed pt to come in at the time discussed and is shown on PV instructions.  Pt instructed to use [employer] or GoodRx for a price reduction on prep  Instructed pt where to find PV instructions in My Chart. . Instructed pt on all aspects of written instructions including med holds clothing to wear and foods to eat and not eat as well as after procedure legal restrictions and to call MD on call if needed.. Pt states understanding. Instructed pt to review instructions again prior to procedure and call main # given if has any questions or any issues. Pt states they will.

## 2024-11-02 NOTE — Telephone Encounter (Signed)
 Attempt to reach to inform him that he will need to be done at the hospiatl. LM with call back #

## 2024-11-02 NOTE — Telephone Encounter (Signed)
 New instructions with new date and location sent to My Chart

## 2024-11-02 NOTE — Telephone Encounter (Signed)
 Pt scheduled for colon at [facility] 11/25/24 at 10:17am. Case#[id number]. Please notify pt of appt.

## 2024-11-02 NOTE — Telephone Encounter (Signed)
 New instructions for hospital made with new dates and sent to My Chart

## 2024-11-02 NOTE — Telephone Encounter (Signed)
 Yes, His procedure will need to be scheduled at the hospital. Thanks,  Dr. Abran

## 2024-11-04 ENCOUNTER — Encounter: Payer: Self-pay | Admitting: Physician Assistant

## 2024-11-04 ENCOUNTER — Telehealth: Payer: Self-pay

## 2024-11-04 ENCOUNTER — Ambulatory Visit: Admitting: Physician Assistant

## 2024-11-04 VITALS — BP 180/106 | Temp 99.6°F | Ht 66.0 in | Wt 169.2 lb

## 2024-11-04 DIAGNOSIS — G5631 Lesion of radial nerve, right upper limb: Secondary | ICD-10-CM | POA: Diagnosis not present

## 2024-11-04 DIAGNOSIS — Z09 Encounter for follow-up examination after completed treatment for conditions other than malignant neoplasm: Secondary | ICD-10-CM | POA: Diagnosis not present

## 2024-11-04 DIAGNOSIS — M5020 Other cervical disc displacement, unspecified cervical region: Secondary | ICD-10-CM

## 2024-11-04 NOTE — Telephone Encounter (Signed)
 Copied from CRM #[id number]. Topic: Clinical - Medical Advice >> Nov 04, 2024 11:52 AM Harlene ORN wrote: Reason for CRM:  [place] [employer]  did a medication council with the patient noticed they are not taking any Statin medicine to prevent heartattacks and stroke. Patient is interested in being prescribed a statin medication.  Went over the meds and his Ozempic  has a high deductible and he cannpt afford it this year and is thinking about discontinuing. Due to federal governemnt changes, their deductibles have gone up and instead of paying a co-pay, it's now a co-insurance.   Also, it might be cheaper to through [facility] mail order to get his tier 1 and tier 2 medications for free. Please send medications to [facility]. Phone for [facility]: [phone]  Phone: [phone], ext. [phone]

## 2024-11-04 NOTE — Progress Notes (Signed)
 "  REFERRING PHYSICIAN:  Plotnikov, Karlynn GAILS, Md [address] 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  He continues to have some paresthesias predominantly on the right but also on the left side.  Describes a tightness and tingling on both sides that go to the tops of his wrists.  Compared to preoperatively he does have significant improvement in his wrist function, but is having persistent difficulty in lifting with his fingers and also reports tightness/stiffness.  He feels as though he has become more unsteady on his feet over the last couple of months.  No changes to continence.     PHYSICAL EXAMINATION:  NEUROLOGICAL:  General: In no acute distress.   Awake, alert, oriented to person, place, and time.  Pupils equal round and reactive to light.  Facial tone is symmetric.    Strength:  Patient has good proximal strength to baseline.  Minimal improvement with finger extension strength on the right side.  Continues to be 5/5 strength proximally with exception of 4+ in his left triceps.  Some slight hand weakness is left-sided as well out of 4+.  No Hoffmann sign was noted.     Incision c/d/I  Imaging:  MRI CERVICAL SPINE WITHOUT CONTRAST   TECHNIQUE: Multiplanar, multisequence MR imaging of the cervical spine was performed. No intravenous contrast was administered.   COMPARISON:  Radiography 04/30/2023   FINDINGS: Alignment: Normal   Vertebrae: No fracture or focal bone lesion. No edematous endplate marrow changes or edematous facet arthritis.   Cord: No primary cord lesion.  See below regarding stenosis.   Posterior Fossa, vertebral arteries, paraspinal tissues: Negative   Disc levels:   Foramen magnum, C1-2 and C2-3 levels are normal.   C3-4: Chronic degenerative spondylosis with  endplate osteophytes and mild bulging of the disc. Mild canal narrowing with AP diameter in the midline 8.5 mm. No cord compression. Mild bilateral foraminal narrowing, not visibly compressive.   C4-5: Mild bulging of the disc.  No canal or foraminal stenosis.   C5-6: Spondylosis with endplate osteophytes and protruding disc material more prominent in the right posterolateral direction. Effacement of the subarachnoid space with indentation of the cord, particularly on the right. AP diameter of the canal in the midline 7.4 mm. Foraminal stenosis right worse than left. In particular, the right C6 nerve root is likely affected.   C6-7: Endplate osteophytes and bulging of the disc more prominent towards the right. No compressive canal stenosis. Moderate foraminal narrowing on the right that could possibly affect the C7 nerve. This is not as severe as the level above.   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   IMPRESSION: 1. 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 2. C5-6: Spondylosis with endplate osteophytes and protruding disc material more prominent in the right posterolateral direction. Effacement of the subarachnoid space with indentation of the cord, particularly on the right. AP diameter of the canal in the midline 7.4 mm. Foraminal stenosis right worse than left. In particular, the right C6 nerve root is likely affected. 3. C6-7: Endplate osteophytes and bulging of the disc more prominent towards the right. Moderate foraminal narrowing on the right that could possibly affect the C7 nerve. This is not as severe as the level  above.    Assessment / Plan: TAN CLOPPER is doing pretty well after right PIN decompression.  However, there is some concerns that he is having some cervical radiculopathy symptoms.  He plans to follow with Dr. Tobie  in February, but given worsening symptoms we will go ahead and order a MRI of his cervical spine for further evaluation of worsening stenosis.  Will review results once complete.  Advised to contact the office if any questions or concerns arise.   Lyle Decamp PA-C Dept of Neurosurgery    "

## 2024-11-06 ENCOUNTER — Other Ambulatory Visit: Payer: Self-pay | Admitting: Internal Medicine

## 2024-11-06 MED ORDER — ATORVASTATIN CALCIUM 10 MG PO TABS: 10.0000 mg | ORAL_TABLET | Freq: Every day | ORAL | 3 refills | Status: AC

## 2024-11-06 NOTE — Progress Notes (Signed)
 [name] [name]                                          MRN: [id number]   11/06/2024   The VBCI Quality Team Specialist reviewed this patient medical record for the purposes of chart review for care gap closure. The following were reviewed: chart review for care gap closure-glycemic status assessment.    VBCI Quality Team

## 2024-11-06 NOTE — Telephone Encounter (Signed)
 Noted.  Atorvastatin prescription was sent to [facility].  Thanks

## 2024-11-09 NOTE — Progress Notes (Signed)
 [name] [name]                                          MRN: [id number]   11/09/2024   The VBCI Quality Team Specialist reviewed this patient medical record for the purposes of chart review for care gap closure. The following were reviewed: chart review for care gap closure-glycemic status assessment.    VBCI Quality Team

## 2024-11-11 ENCOUNTER — Encounter: Payer: Self-pay | Admitting: Internal Medicine

## 2024-11-12 ENCOUNTER — Encounter (HOSPITAL_COMMUNITY): Payer: Self-pay | Admitting: Internal Medicine

## 2024-11-16 ENCOUNTER — Encounter: Admitting: Internal Medicine

## 2024-11-17 ENCOUNTER — Other Ambulatory Visit

## 2024-11-20 ENCOUNTER — Telehealth: Payer: Self-pay

## 2024-11-20 NOTE — Telephone Encounter (Signed)
 Great thanks so much

## 2024-11-20 NOTE — Telephone Encounter (Signed)
 Elenor have you spoken with this pt about his appt?

## 2024-11-20 NOTE — Telephone Encounter (Signed)
 Procedure:COLON Procedure date: 11/25/24 Procedure location: [facility] Arrival Time: 8:47 Spoke with the patient Y/N: N Any prep concerns? N Has the patient obtained the prep from the pharmacy ? N Do you have a care partner and transportation: N Any additional concerns? N  I called the patient and no answer therefore I left a detailed message about the procedure and the office number in case the patient has question are concerns.

## 2024-11-25 ENCOUNTER — Encounter (HOSPITAL_COMMUNITY): Admission: RE | Payer: Self-pay | Source: Home / Self Care

## 2024-11-25 ENCOUNTER — Ambulatory Visit (HOSPITAL_COMMUNITY): Admission: RE | Admit: 2024-11-25 | Admitting: Internal Medicine

## 2024-11-25 SURGERY — COLONOSCOPY
Anesthesia: Monitor Anesthesia Care

## 2024-11-29 ENCOUNTER — Other Ambulatory Visit

## 2024-12-01 ENCOUNTER — Ambulatory Visit: Admitting: Neurology

## 2024-12-16 ENCOUNTER — Ambulatory Visit: Admitting: Internal Medicine

## 2025-05-27 ENCOUNTER — Ambulatory Visit
